# Patient Record
Sex: Male | Born: 1935
Health system: Southern US, Community
[De-identification: ages and names within clinical notes are randomized; demographics above are authoritative.]

## PROBLEM LIST (undated history)

## (undated) DIAGNOSIS — K219 Gastro-esophageal reflux disease without esophagitis: Secondary | ICD-10-CM

## (undated) DIAGNOSIS — R911 Solitary pulmonary nodule: Secondary | ICD-10-CM

## (undated) DIAGNOSIS — C61 Malignant neoplasm of prostate: Secondary | ICD-10-CM

## (undated) DIAGNOSIS — L409 Psoriasis, unspecified: Secondary | ICD-10-CM

## (undated) DIAGNOSIS — M1990 Unspecified osteoarthritis, unspecified site: Secondary | ICD-10-CM

## (undated) DIAGNOSIS — I2510 Atherosclerotic heart disease of native coronary artery without angina pectoris: Secondary | ICD-10-CM

## (undated) DIAGNOSIS — C649 Malignant neoplasm of unspecified kidney, except renal pelvis: Secondary | ICD-10-CM

## (undated) DIAGNOSIS — I10 Essential (primary) hypertension: Secondary | ICD-10-CM

## (undated) DIAGNOSIS — H3530 Unspecified macular degeneration: Secondary | ICD-10-CM

## (undated) DIAGNOSIS — E785 Hyperlipidemia, unspecified: Secondary | ICD-10-CM

## (undated) DIAGNOSIS — D369 Benign neoplasm, unspecified site: Secondary | ICD-10-CM

## (undated) DIAGNOSIS — C801 Malignant (primary) neoplasm, unspecified: Secondary | ICD-10-CM

## (undated) DIAGNOSIS — I4510 Unspecified right bundle-branch block: Secondary | ICD-10-CM

## (undated) DIAGNOSIS — D649 Anemia, unspecified: Secondary | ICD-10-CM

## (undated) DIAGNOSIS — K859 Acute pancreatitis without necrosis or infection, unspecified: Secondary | ICD-10-CM

## (undated) DIAGNOSIS — C4490 Unspecified malignant neoplasm of skin, unspecified: Secondary | ICD-10-CM

## (undated) DIAGNOSIS — N529 Male erectile dysfunction, unspecified: Secondary | ICD-10-CM

## (undated) HISTORY — DX: Male erectile dysfunction, unspecified: N52.9

## (undated) HISTORY — PX: PILONIDAL CYST EXCISION: SHX744

## (undated) HISTORY — DX: Hyperlipidemia, unspecified: E78.5

## (undated) HISTORY — DX: Malignant neoplasm of unspecified kidney, except renal pelvis: C64.9

## (undated) HISTORY — DX: Unspecified right bundle-branch block: I45.10

## (undated) HISTORY — DX: Anemia, unspecified: D64.9

## (undated) HISTORY — DX: Solitary pulmonary nodule: R91.1

## (undated) HISTORY — DX: Atherosclerotic heart disease of native coronary artery without angina pectoris: I25.10

## (undated) HISTORY — DX: Benign neoplasm, unspecified site: D36.9

---

## 2002-08-18 ENCOUNTER — Encounter: Admission: RE | Admit: 2002-08-18 | Discharge: 2002-11-16 | Payer: Self-pay | Admitting: Family Medicine

## 2003-11-25 ENCOUNTER — Ambulatory Visit (HOSPITAL_COMMUNITY): Admission: RE | Admit: 2003-11-25 | Discharge: 2003-11-25 | Payer: Self-pay | Admitting: Family Medicine

## 2006-12-27 ENCOUNTER — Encounter: Admission: RE | Admit: 2006-12-27 | Discharge: 2006-12-27 | Payer: Self-pay

## 2008-11-09 HISTORY — PX: US ECHOCARDIOGRAPHY: HXRAD669

## 2010-06-26 ENCOUNTER — Ambulatory Visit (HOSPITAL_COMMUNITY): Admission: RE | Admit: 2010-06-26 | Discharge: 2010-06-26 | Payer: Self-pay | Admitting: Family Medicine

## 2011-02-13 ENCOUNTER — Other Ambulatory Visit: Payer: Self-pay | Admitting: Cardiovascular Disease

## 2011-02-13 ENCOUNTER — Ambulatory Visit
Admission: RE | Admit: 2011-02-13 | Discharge: 2011-02-13 | Disposition: A | Discharge status: Home / Self Care | Payer: Medicare Other | Source: Ambulatory Visit | Attending: Cardiovascular Disease | Admitting: Cardiovascular Disease

## 2011-02-13 DIAGNOSIS — I2510 Atherosclerotic heart disease of native coronary artery without angina pectoris: Secondary | ICD-10-CM

## 2011-02-15 HISTORY — PX: NM MYOCAR PERF WALL MOTION: HXRAD629

## 2011-02-19 ENCOUNTER — Ambulatory Visit (HOSPITAL_COMMUNITY)
Admission: RE | Admit: 2011-02-19 | Discharge: 2011-02-19 | Disposition: A | Discharge status: Home / Self Care | Payer: Medicare Other | Source: Ambulatory Visit | Attending: Cardiovascular Disease | Admitting: Cardiovascular Disease

## 2011-02-19 DIAGNOSIS — R9431 Abnormal electrocardiogram [ECG] [EKG]: Secondary | ICD-10-CM | POA: Insufficient documentation

## 2011-02-19 DIAGNOSIS — I2510 Atherosclerotic heart disease of native coronary artery without angina pectoris: Secondary | ICD-10-CM | POA: Insufficient documentation

## 2011-02-19 DIAGNOSIS — E119 Type 2 diabetes mellitus without complications: Secondary | ICD-10-CM | POA: Insufficient documentation

## 2011-02-19 HISTORY — PX: CARDIAC CATHETERIZATION: SHX172

## 2011-02-19 LAB — GLUCOSE, CAPILLARY
Glucose-Capillary: 143 mg/dL — ABNORMAL HIGH (ref 70–99)
Glucose-Capillary: 143 mg/dL — ABNORMAL HIGH (ref 70–99)
Glucose-Capillary: 110 mg/dL — ABNORMAL HIGH (ref 70–99)

## 2011-02-26 ENCOUNTER — Other Ambulatory Visit: Payer: Self-pay | Admitting: Thoracic Surgery (Cardiothoracic Vascular Surgery)

## 2011-02-26 ENCOUNTER — Encounter (INDEPENDENT_AMBULATORY_CARE_PROVIDER_SITE_OTHER): Payer: Medicare Other | Admitting: Thoracic Surgery (Cardiothoracic Vascular Surgery)

## 2011-02-26 ENCOUNTER — Ambulatory Visit (HOSPITAL_COMMUNITY)
Admission: RE | Admit: 2011-02-26 | Discharge: 2011-02-26 | Disposition: A | Discharge status: Home / Self Care | Payer: Medicare Other | Source: Ambulatory Visit | Attending: Thoracic Surgery (Cardiothoracic Vascular Surgery) | Admitting: Thoracic Surgery (Cardiothoracic Vascular Surgery)

## 2011-02-26 ENCOUNTER — Encounter (HOSPITAL_COMMUNITY): Payer: Medicare Other

## 2011-02-26 ENCOUNTER — Ambulatory Visit (HOSPITAL_COMMUNITY): Payer: Medicare Other

## 2011-02-26 ENCOUNTER — Encounter (HOSPITAL_COMMUNITY)
Admission: RE | Admit: 2011-02-26 | Discharge: 2011-02-26 | Disposition: A | Discharge status: Home / Self Care | Payer: Medicare Other | Source: Ambulatory Visit | Attending: Thoracic Surgery (Cardiothoracic Vascular Surgery) | Admitting: Thoracic Surgery (Cardiothoracic Vascular Surgery)

## 2011-02-26 DIAGNOSIS — Z01810 Encounter for preprocedural cardiovascular examination: Secondary | ICD-10-CM

## 2011-02-26 DIAGNOSIS — Z01812 Encounter for preprocedural laboratory examination: Secondary | ICD-10-CM | POA: Insufficient documentation

## 2011-02-26 DIAGNOSIS — I2510 Atherosclerotic heart disease of native coronary artery without angina pectoris: Secondary | ICD-10-CM

## 2011-02-26 DIAGNOSIS — Z01818 Encounter for other preprocedural examination: Secondary | ICD-10-CM | POA: Insufficient documentation

## 2011-02-26 DIAGNOSIS — I519 Heart disease, unspecified: Secondary | ICD-10-CM

## 2011-02-26 LAB — COMPREHENSIVE METABOLIC PANEL
ALT: 16 U/L (ref 0–53)
AST: 18 U/L (ref 0–37)
Albumin: 3.9 g/dL (ref 3.5–5.2)
Alkaline Phosphatase: 66 U/L (ref 39–117)
BUN: 19 mg/dL (ref 6–23)
CO2: 25 mEq/L (ref 19–32)
Calcium: 8.9 mg/dL (ref 8.4–10.5)
Chloride: 105 mEq/L (ref 96–112)
Creatinine, Ser: 1.21 mg/dL (ref 0.4–1.5)
GFR calc Af Amer: >60 mL/min (ref >60)
GFR calc non Af Amer: 58 mL/min — ABNORMAL LOW (ref >60)
Glucose, Bld: 70 mg/dL (ref 70–99)
Potassium: 4.7 mEq/L (ref 3.5–5.1)
Sodium: 138 mEq/L (ref 135–145)
Total Bilirubin: 0.8 mg/dL (ref 0.3–1.2)
Total Protein: 6.1 g/dL (ref 6.0–8.3)

## 2011-02-26 LAB — CBC
HCT: 35.7 % — ABNORMAL LOW (ref 39.0–52.0)
Hemoglobin: 12 g/dL — ABNORMAL LOW (ref 13.0–17.0)
MCH: 30.6 pg (ref 26.0–34.0)
MCHC: 33.6 g/dL (ref 30.0–36.0)
MCV: 91.1 fL (ref 78.0–100.0)
Platelets: 171 10*3/uL (ref 150–400)
RBC: 3.92 MIL/uL — ABNORMAL LOW (ref 4.22–5.81)
RDW: 13.3 % (ref 11.5–15.5)
WBC: 7.9 10*3/uL (ref 4.0–10.5)

## 2011-02-26 LAB — PROTIME-INR
INR: 1.02 (ref 0.00–1.49)
Prothrombin Time: 13.6 seconds (ref 11.6–15.2)

## 2011-02-26 LAB — BLOOD GAS, ARTERIAL
Acid-Base Excess: 0.7 mmol/L (ref 0.0–2.0)
Bicarbonate: 24.8 mEq/L — ABNORMAL HIGH (ref 20.0–24.0)
Drawn by: 206361
FIO2: 0.21 %
O2 Saturation: 98.2 %
Patient temperature: 98.6
TCO2: 26 mmol/L (ref 0–100)
pCO2 arterial: 39.4 mmHg (ref 35.0–45.0)
pH, Arterial: 7.414 (ref 7.350–7.450)
pO2, Arterial: 95 mmHg (ref 80.0–100.0)

## 2011-02-26 LAB — URINALYSIS, ROUTINE W REFLEX MICROSCOPIC
Bilirubin Urine: NEGATIVE
Glucose, UA: NEGATIVE mg/dL
Hgb urine dipstick: NEGATIVE
Ketones, ur: NEGATIVE mg/dL
Nitrite: NEGATIVE
Protein, ur: NEGATIVE mg/dL
Specific Gravity, Urine: 1.016 (ref 1.005–1.030)
Urobilinogen, UA: 0.2 mg/dL (ref 0.0–1.0)
pH: 7.5 (ref 5.0–8.0)

## 2011-02-26 LAB — ABO/RH: ABO/RH(D): A NEG

## 2011-02-26 LAB — TYPE AND SCREEN
ABO/RH(D): A NEG
Antibody Screen: NEGATIVE

## 2011-02-26 LAB — HEMOGLOBIN A1C
Hgb A1c MFr Bld: 6.8 % — ABNORMAL HIGH (ref <5.7)
Mean Plasma Glucose: 148 mg/dL — ABNORMAL HIGH (ref <117)

## 2011-02-26 LAB — SURGICAL PCR SCREEN
MRSA, PCR: NEGATIVE
Staphylococcus aureus: POSITIVE — AB

## 2011-02-26 LAB — APTT: aPTT: 27 seconds (ref 24–37)

## 2011-02-27 ENCOUNTER — Ambulatory Visit (HOSPITAL_COMMUNITY)
Admission: RE | Admit: 2011-02-27 | Discharge: 2011-02-27 | Disposition: A | Discharge status: Home / Self Care | Payer: Medicare Other | Source: Ambulatory Visit | Attending: Thoracic Surgery (Cardiothoracic Vascular Surgery) | Admitting: Thoracic Surgery (Cardiothoracic Vascular Surgery)

## 2011-02-27 DIAGNOSIS — R0989 Other specified symptoms and signs involving the circulatory and respiratory systems: Secondary | ICD-10-CM | POA: Insufficient documentation

## 2011-02-27 DIAGNOSIS — Z01811 Encounter for preprocedural respiratory examination: Secondary | ICD-10-CM | POA: Insufficient documentation

## 2011-02-27 DIAGNOSIS — R0609 Other forms of dyspnea: Secondary | ICD-10-CM | POA: Insufficient documentation
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
## 2011-02-28 ENCOUNTER — Inpatient Hospital Stay (HOSPITAL_COMMUNITY)
Admission: RE | Admit: 2011-02-28 | Discharge: 2011-03-07 | DRG: 236 | Disposition: A | Discharge status: Home / Self Care | Payer: Medicare Other | Source: Ambulatory Visit | Attending: Thoracic Surgery (Cardiothoracic Vascular Surgery) | Admitting: Thoracic Surgery (Cardiothoracic Vascular Surgery)

## 2011-02-28 ENCOUNTER — Inpatient Hospital Stay (HOSPITAL_COMMUNITY): Payer: Medicare Other

## 2011-02-28 DIAGNOSIS — K219 Gastro-esophageal reflux disease without esophagitis: Secondary | ICD-10-CM | POA: Diagnosis present

## 2011-02-28 DIAGNOSIS — Z87891 Personal history of nicotine dependence: Secondary | ICD-10-CM

## 2011-02-28 DIAGNOSIS — M810 Age-related osteoporosis without current pathological fracture: Secondary | ICD-10-CM | POA: Diagnosis present

## 2011-02-28 DIAGNOSIS — M109 Gout, unspecified: Secondary | ICD-10-CM | POA: Diagnosis present

## 2011-02-28 DIAGNOSIS — D696 Thrombocytopenia, unspecified: Secondary | ICD-10-CM | POA: Diagnosis present

## 2011-02-28 DIAGNOSIS — I10 Essential (primary) hypertension: Secondary | ICD-10-CM | POA: Diagnosis present

## 2011-02-28 DIAGNOSIS — I209 Angina pectoris, unspecified: Secondary | ICD-10-CM | POA: Diagnosis present

## 2011-02-28 DIAGNOSIS — I2510 Atherosclerotic heart disease of native coronary artery without angina pectoris: Principal | ICD-10-CM | POA: Diagnosis present

## 2011-02-28 DIAGNOSIS — E119 Type 2 diabetes mellitus without complications: Secondary | ICD-10-CM | POA: Diagnosis present

## 2011-02-28 DIAGNOSIS — Z7982 Long term (current) use of aspirin: Secondary | ICD-10-CM

## 2011-02-28 DIAGNOSIS — J438 Other emphysema: Secondary | ICD-10-CM | POA: Diagnosis present

## 2011-02-28 DIAGNOSIS — D62 Acute posthemorrhagic anemia: Secondary | ICD-10-CM | POA: Diagnosis not present

## 2011-02-28 HISTORY — PX: CORONARY ARTERY BYPASS GRAFT: SHX141

## 2011-02-28 LAB — POCT I-STAT 4, (NA,K, GLUC, HGB,HCT)
Glucose, Bld: 153 mg/dL — ABNORMAL HIGH (ref 70–99)
Glucose, Bld: 132 mg/dL — ABNORMAL HIGH (ref 70–99)
Glucose, Bld: 115 mg/dL — ABNORMAL HIGH (ref 70–99)
Glucose, Bld: 133 mg/dL — ABNORMAL HIGH (ref 70–99)
Glucose, Bld: 130 mg/dL — ABNORMAL HIGH (ref 70–99)
HCT: 30 % — ABNORMAL LOW (ref 39.0–52.0)
HCT: 28 % — ABNORMAL LOW (ref 39.0–52.0)
HCT: 24 % — ABNORMAL LOW (ref 39.0–52.0)
HCT: 30 % — ABNORMAL LOW (ref 39.0–52.0)
Hemoglobin: 10.2 g/dL — ABNORMAL LOW (ref 13.0–17.0)
Hemoglobin: 9.5 g/dL — ABNORMAL LOW (ref 13.0–17.0)
Hemoglobin: 8.2 g/dL — ABNORMAL LOW (ref 13.0–17.0)
Hemoglobin: 8.5 g/dL — ABNORMAL LOW (ref 13.0–17.0)
Hemoglobin: 10.2 g/dL — ABNORMAL LOW (ref 13.0–17.0)
Potassium: 4.4 mEq/L (ref 3.5–5.1)
Potassium: 4.6 mEq/L (ref 3.5–5.1)
Potassium: 4.6 mEq/L (ref 3.5–5.1)
Potassium: 4.9 mEq/L (ref 3.5–5.1)
Potassium: 4.8 mEq/L (ref 3.5–5.1)
Sodium: 139 mEq/L (ref 135–145)
Sodium: 138 mEq/L (ref 135–145)
Sodium: 134 mEq/L — ABNORMAL LOW (ref 135–145)
Sodium: 133 mEq/L — ABNORMAL LOW (ref 135–145)

## 2011-02-28 LAB — PROTIME-INR
INR: 1.43 (ref 0.00–1.49)
Prothrombin Time: 17.6 seconds — ABNORMAL HIGH (ref 11.6–15.2)

## 2011-02-28 LAB — POCT I-STAT 3, ART BLOOD GAS (G3+)
Acid-Base Excess: 1 mmol/L (ref 0.0–2.0)
Acid-Base Excess: 2 mmol/L (ref 0.0–2.0)
Acid-Base Excess: 2 mmol/L (ref 0.0–2.0)
Acid-base deficit: 1 mmol/L (ref 0.0–2.0)
Acid-base deficit: 1 mmol/L (ref 0.0–2.0)
Bicarbonate: 25.5 mEq/L — ABNORMAL HIGH (ref 20.0–24.0)
Bicarbonate: 25.5 mEq/L — ABNORMAL HIGH (ref 20.0–24.0)
Bicarbonate: 25.5 mEq/L — ABNORMAL HIGH (ref 20.0–24.0)
Bicarbonate: 24.8 mEq/L — ABNORMAL HIGH (ref 20.0–24.0)
Bicarbonate: 26.2 mEq/L — ABNORMAL HIGH (ref 20.0–24.0)
Bicarbonate: 24.3 mEq/L — ABNORMAL HIGH (ref 20.0–24.0)
Bicarbonate: 24.4 mEq/L — ABNORMAL HIGH (ref 20.0–24.0)
Bicarbonate: 24.9 mEq/L — ABNORMAL HIGH (ref 20.0–24.0)
Bicarbonate: 24.9 mEq/L — ABNORMAL HIGH (ref 20.0–24.0)
Bicarbonate: 25.1 mEq/L — ABNORMAL HIGH (ref 20.0–24.0)
O2 Saturation: 100 %
O2 Saturation: 100 %
O2 Saturation: 100 %
O2 Saturation: 100 %
O2 Saturation: 100 %
O2 Saturation: 100 %
O2 Saturation: 100 %
Patient temperature: 34.1
Patient temperature: 34.7
Patient temperature: 37
TCO2: 26 mmol/L (ref 0–100)
TCO2: 27 mmol/L (ref 0–100)
TCO2: 26 mmol/L (ref 0–100)
TCO2: 27 mmol/L (ref 0–100)
TCO2: 24 mmol/L (ref 0–100)
TCO2: 25 mmol/L (ref 0–100)
TCO2: 23 mmol/L (ref 0–100)
TCO2: 26 mmol/L (ref 0–100)
TCO2: 26 mmol/L (ref 0–100)
TCO2: 26 mmol/L (ref 0–100)
pCO2 arterial: 32.5 mmHg — ABNORMAL LOW (ref 35.0–45.0)
pCO2 arterial: 28.9 mmHg — ABNORMAL LOW (ref 35.0–45.0)
pCO2 arterial: 42 mmHg (ref 35.0–45.0)
pCO2 arterial: 35.9 mmHg (ref 35.0–45.0)
pCO2 arterial: 43.7 mmHg (ref 35.0–45.0)
pH, Arterial: 7.489 — ABNORMAL HIGH (ref 7.350–7.450)
pH, Arterial: 7.502 — ABNORMAL HIGH (ref 7.350–7.450)
pH, Arterial: 7.403 (ref 7.350–7.450)
pH, Arterial: 7.393 (ref 7.350–7.450)
pH, Arterial: 7.449 (ref 7.350–7.450)
pH, Arterial: 7.303 — ABNORMAL LOW (ref 7.350–7.450)
pH, Arterial: 7.367 (ref 7.350–7.450)
pO2, Arterial: 425 mmHg — ABNORMAL HIGH (ref 80.0–100.0)
pO2, Arterial: 263 mmHg — ABNORMAL HIGH (ref 80.0–100.0)
pO2, Arterial: 369 mmHg — ABNORMAL HIGH (ref 80.0–100.0)
pO2, Arterial: 153 mmHg — ABNORMAL HIGH (ref 80.0–100.0)
pO2, Arterial: 297 mmHg — ABNORMAL HIGH (ref 80.0–100.0)
pO2, Arterial: 174 mmHg — ABNORMAL HIGH (ref 80.0–100.0)

## 2011-02-28 LAB — CBC
Hemoglobin: 9.4 g/dL — ABNORMAL LOW (ref 13.0–17.0)
MCH: 30.6 pg (ref 26.0–34.0)
MCH: 31.2 pg (ref 26.0–34.0)
MCHC: 34.1 g/dL (ref 30.0–36.0)
MCV: 89 fL (ref 78.0–100.0)
Platelets: 114 10*3/uL — ABNORMAL LOW (ref 150–400)
Platelets: 106 10*3/uL — ABNORMAL LOW (ref 150–400)
RBC: 3.24 MIL/uL — ABNORMAL LOW (ref 4.22–5.81)
RBC: 3.01 MIL/uL — ABNORMAL LOW (ref 4.22–5.81)
WBC: 13.3 10*3/uL — ABNORMAL HIGH (ref 4.0–10.5)

## 2011-02-28 LAB — POCT I-STAT, CHEM 8
BUN: 16 mg/dL (ref 6–23)
Hemoglobin: 8.8 g/dL — ABNORMAL LOW (ref 13.0–17.0)
Sodium: 138 mEq/L (ref 135–145)
TCO2: 24 mmol/L (ref 0–100)

## 2011-02-28 LAB — CREATININE, SERUM
GFR calc Af Amer: >60 mL/min (ref >60)
GFR calc non Af Amer: 55 mL/min — ABNORMAL LOW (ref >60)

## 2011-02-28 LAB — GLUCOSE, CAPILLARY
Glucose-Capillary: 136 mg/dL — ABNORMAL HIGH (ref 70–99)
Glucose-Capillary: 81 mg/dL (ref 70–99)
Glucose-Capillary: 151 mg/dL — ABNORMAL HIGH (ref 70–99)
Glucose-Capillary: 142 mg/dL — ABNORMAL HIGH (ref 70–99)

## 2011-02-28 LAB — MAGNESIUM: Magnesium: 2.4 mg/dL (ref 1.5–2.5)

## 2011-02-28 LAB — PLATELET COUNT: Platelets: 108 10*3/uL — ABNORMAL LOW (ref 150–400)

## 2011-02-28 LAB — HEMOGLOBIN AND HEMATOCRIT, BLOOD
HCT: 22.7 % — ABNORMAL LOW (ref 39.0–52.0)
Hemoglobin: 7.9 g/dL — ABNORMAL LOW (ref 13.0–17.0)

## 2011-03-01 ENCOUNTER — Inpatient Hospital Stay (HOSPITAL_COMMUNITY): Payer: Medicare Other

## 2011-03-01 LAB — BASIC METABOLIC PANEL
CO2: 25 mEq/L (ref 19–32)
Glucose, Bld: 176 mg/dL — ABNORMAL HIGH (ref 70–99)
Potassium: 4.6 mEq/L (ref 3.5–5.1)
Sodium: 137 mEq/L (ref 135–145)

## 2011-03-01 LAB — CBC
HCT: 26 % — ABNORMAL LOW (ref 39.0–52.0)
Hemoglobin: 9 g/dL — ABNORMAL LOW (ref 13.0–17.0)
MCH: 31 pg (ref 26.0–34.0)
MCHC: 34.6 g/dL (ref 30.0–36.0)
MCV: 90.1 fL (ref 78.0–100.0)
Platelets: 112 10*3/uL — ABNORMAL LOW (ref 150–400)
RDW: 13.6 % (ref 11.5–15.5)
WBC: 16 10*3/uL — ABNORMAL HIGH (ref 4.0–10.5)

## 2011-03-01 LAB — GLUCOSE, CAPILLARY
Glucose-Capillary: 153 mg/dL — ABNORMAL HIGH (ref 70–99)
Glucose-Capillary: 162 mg/dL — ABNORMAL HIGH (ref 70–99)
Glucose-Capillary: 244 mg/dL — ABNORMAL HIGH (ref 70–99)

## 2011-03-01 LAB — POCT I-STAT, CHEM 8
BUN: 25 mg/dL — ABNORMAL HIGH (ref 6–23)
Calcium, Ion: 1.09 mmol/L — ABNORMAL LOW (ref 1.12–1.32)
Creatinine, Ser: 1.8 mg/dL — ABNORMAL HIGH (ref 0.4–1.5)
TCO2: 27 mmol/L (ref 0–100)

## 2011-03-01 LAB — MAGNESIUM: Magnesium: 2.1 mg/dL (ref 1.5–2.5)

## 2011-03-02 ENCOUNTER — Inpatient Hospital Stay (HOSPITAL_COMMUNITY): Payer: Medicare Other

## 2011-03-02 LAB — COMPREHENSIVE METABOLIC PANEL
ALT: 14 U/L (ref 0–53)
AST: 22 U/L (ref 0–37)
Albumin: 3.2 g/dL — ABNORMAL LOW (ref 3.5–5.2)
Alkaline Phosphatase: 36 U/L — ABNORMAL LOW (ref 39–117)
BUN: 25 mg/dL — ABNORMAL HIGH (ref 6–23)
CO2: 29 mEq/L (ref 19–32)
Calcium: 8.2 mg/dL — ABNORMAL LOW (ref 8.4–10.5)
Chloride: 100 mEq/L (ref 96–112)
Creatinine, Ser: 1.63 mg/dL — ABNORMAL HIGH (ref 0.4–1.5)
GFR calc Af Amer: 50 mL/min — ABNORMAL LOW (ref >60)
GFR calc non Af Amer: 41 mL/min — ABNORMAL LOW (ref >60)
Glucose, Bld: 76 mg/dL (ref 70–99)
Potassium: 3.7 mEq/L (ref 3.5–5.1)
Sodium: 138 mEq/L (ref 135–145)
Total Bilirubin: 0.9 mg/dL (ref 0.3–1.2)
Total Protein: 5.1 g/dL — ABNORMAL LOW (ref 6.0–8.3)

## 2011-03-02 LAB — GLUCOSE, CAPILLARY: Glucose-Capillary: 78 mg/dL (ref 70–99)

## 2011-03-02 LAB — POCT I-STAT, CHEM 8
Calcium, Ion: 1.11 mmol/L — ABNORMAL LOW (ref 1.12–1.32)
Chloride: 98 mEq/L (ref 96–112)
Glucose, Bld: 76 mg/dL (ref 70–99)
HCT: 24 % — ABNORMAL LOW (ref 39.0–52.0)
Hemoglobin: 8.2 g/dL — ABNORMAL LOW (ref 13.0–17.0)
Potassium: 3.7 mEq/L (ref 3.5–5.1)

## 2011-03-02 LAB — BASIC METABOLIC PANEL
Calcium: 6.4 mg/dL — CL (ref 8.4–10.5)
Chloride: 101 mEq/L (ref 96–112)
Creatinine, Ser: 1.66 mg/dL — ABNORMAL HIGH (ref 0.4–1.5)
GFR calc Af Amer: 49 mL/min — ABNORMAL LOW (ref >60)
GFR calc non Af Amer: 41 mL/min — ABNORMAL LOW (ref >60)

## 2011-03-02 LAB — CALCIUM, IONIZED: Calcium, Ion: 1.15 mmol/L (ref 1.12–1.32)

## 2011-03-02 LAB — CBC
MCH: 31.1 pg (ref 26.0–34.0)
Platelets: 107 10*3/uL — ABNORMAL LOW (ref 150–400)
RBC: 2.7 MIL/uL — ABNORMAL LOW (ref 4.22–5.81)
RDW: 13.7 % (ref 11.5–15.5)
WBC: 12.5 10*3/uL — ABNORMAL HIGH (ref 4.0–10.5)

## 2011-03-03 ENCOUNTER — Inpatient Hospital Stay (HOSPITAL_COMMUNITY): Payer: Medicare Other

## 2011-03-03 LAB — BASIC METABOLIC PANEL
BUN: 28 mg/dL — ABNORMAL HIGH (ref 6–23)
CO2: 31 mEq/L (ref 19–32)
Chloride: 100 mEq/L (ref 96–112)
Creatinine, Ser: 1.55 mg/dL — ABNORMAL HIGH (ref 0.4–1.5)
Glucose, Bld: 121 mg/dL — ABNORMAL HIGH (ref 70–99)
Potassium: 4.1 mEq/L (ref 3.5–5.1)

## 2011-03-03 LAB — CBC
HCT: 25.1 % — ABNORMAL LOW (ref 39.0–52.0)
MCH: 30.7 pg (ref 26.0–34.0)
MCV: 91.6 fL (ref 78.0–100.0)
Platelets: 115 10*3/uL — ABNORMAL LOW (ref 150–400)
RBC: 2.74 MIL/uL — ABNORMAL LOW (ref 4.22–5.81)
WBC: 12.9 10*3/uL — ABNORMAL HIGH (ref 4.0–10.5)

## 2011-03-03 LAB — GLUCOSE, CAPILLARY
Glucose-Capillary: 116 mg/dL — ABNORMAL HIGH (ref 70–99)
Glucose-Capillary: 176 mg/dL — ABNORMAL HIGH (ref 70–99)
Glucose-Capillary: 149 mg/dL — ABNORMAL HIGH (ref 70–99)
Glucose-Capillary: 145 mg/dL — ABNORMAL HIGH (ref 70–99)

## 2011-03-04 LAB — BASIC METABOLIC PANEL
Calcium: 8.1 mg/dL — ABNORMAL LOW (ref 8.4–10.5)
GFR calc Af Amer: 53 mL/min — ABNORMAL LOW (ref >60)
GFR calc non Af Amer: 44 mL/min — ABNORMAL LOW (ref >60)
Glucose, Bld: 96 mg/dL (ref 70–99)
Potassium: 4 mEq/L (ref 3.5–5.1)
Sodium: 138 mEq/L (ref 135–145)

## 2011-03-04 LAB — GLUCOSE, CAPILLARY
Glucose-Capillary: 89 mg/dL (ref 70–99)
Glucose-Capillary: 129 mg/dL — ABNORMAL HIGH (ref 70–99)
Glucose-Capillary: 140 mg/dL — ABNORMAL HIGH (ref 70–99)

## 2011-03-05 LAB — BASIC METABOLIC PANEL
CO2: 30 mEq/L (ref 19–32)
Calcium: 8.4 mg/dL (ref 8.4–10.5)
GFR calc Af Amer: 47 mL/min — ABNORMAL LOW (ref >60)
GFR calc non Af Amer: 39 mL/min — ABNORMAL LOW (ref >60)
Potassium: 4.3 mEq/L (ref 3.5–5.1)
Sodium: 139 mEq/L (ref 135–145)

## 2011-03-05 LAB — GLUCOSE, CAPILLARY
Glucose-Capillary: 119 mg/dL — ABNORMAL HIGH (ref 70–99)
Glucose-Capillary: 198 mg/dL — ABNORMAL HIGH (ref 70–99)
Glucose-Capillary: 182 mg/dL — ABNORMAL HIGH (ref 70–99)

## 2011-03-06 DIAGNOSIS — M79609 Pain in unspecified limb: Secondary | ICD-10-CM

## 2011-03-06 LAB — GLUCOSE, CAPILLARY
Glucose-Capillary: 73 mg/dL (ref 70–99)
Glucose-Capillary: 106 mg/dL — ABNORMAL HIGH (ref 70–99)
Glucose-Capillary: 115 mg/dL — ABNORMAL HIGH (ref 70–99)
Glucose-Capillary: 97 mg/dL (ref 70–99)

## 2011-03-06 LAB — BASIC METABOLIC PANEL
BUN: 28 mg/dL — ABNORMAL HIGH (ref 6–23)
Calcium: 8.5 mg/dL (ref 8.4–10.5)
GFR calc non Af Amer: 44 mL/min — ABNORMAL LOW (ref >60)
Glucose, Bld: 67 mg/dL — ABNORMAL LOW (ref 70–99)
Sodium: 139 mEq/L (ref 135–145)

## 2011-03-07 LAB — GLUCOSE, CAPILLARY: Glucose-Capillary: 93 mg/dL (ref 70–99)

## 2011-03-07 NOTE — Discharge Summary (Signed)
NAME:  Grant [name], Grant [name] NO.:  [id number]  MEDICAL RECORD NO.:  [id number]           PATIENT TYPE:  O  LOCATION:  RESPT                        FACILITY:  [facility]  PHYSICIAN:  Salvatore Decent. Dorris Fetch, M.D.DATE OF BIRTH:  Apr 19, 1934  DATE OF ADMISSION:  02/27/2011 DATE OF DISCHARGE:  02/27/2011                              DISCHARGE SUMMARY   FINAL DIAGNOSIS:  Three-vessel coronary artery disease with exertional angina.  IN-HOSPITAL DIAGNOSES: 1. Hypovolemic shock postoperatively. 2. Bronchospasm postoperatively. 3. Acute renal insufficiency postoperatively. 4. Acute blood loss anemia postoperatively. 5. Postoperative thrombocytopenia. 6. Atrial fibrillation postoperatively.  SECONDARY DIAGNOSES: 1. Non-insulin dependent type 2 diabetes. 2. Hypertension. 3. Hyperlipidemia. 4. Macular degeneration. 5. Osteoarthritis.  IN-HOSPITAL OPERATIONS AND PROCEDURES:  Coronary artery bypass grafting x4 using a left internal mammary artery to left anterior descending, sequential saphenous vein graft to obtuse marginal 1 and 2, saphenous vein graft to acute marginal, endoscopic vein harvesting from right leg.  HISTORY AND PHYSICAL AND HOSPITAL COURSE:  Grant [name] is a 77 year old gentleman with known three-vessel coronary artery disease.  He has been managed medically.  He has been experiencing chest pain with exertion recently.  The patient underwent cardiac catheterization on February 19, 2011, which showed three-vessel coronary artery disease with progressive disease particularly in the circumflex.  The patient was advised to undergo coronary artery bypass grafting.  He was seen and evaluated by Dr. Dorris Fetch.  Dr. Dorris Fetch discussed with the patient risks and benefits of undergoing coronary artery bypass grafting.  The patient nods understanding and agreed to proceed.  Surgery was scheduled for February 28, 2011.  For further details of the patient's past  medical history and physical exam, please see dictated H and P.  The patient was taken to the operating room on February 28, 2011, where he underwent coronary artery bypass grafting x4 using a left internal mammary artery to left anterior descending, sequential saphenous vein graft to obtuse marginal 1 and 2, saphenous vein graft to acute marginal, endoscopic vein harvesting from right leg.  The patient tolerated this procedure well and was transferred to the intensive care unit in stable condition.  Shortly after arrival to the SICU, the patient had a hemodynamic collapse with a blood pressure down to systolic in the 40s.  The patient was resuscitated at the bedside.  He felt that he experienced hypovolemic shock and bronchospasm.  The patient stabilized with treatment.  The following morning, the patient was able to be extubated.  Post extubation, he was noted to be alert and oriented x4.  Neuro intact.  Blood pressure was stable.  The patient was a-pacing in the 70s.  He was continued on dopamine drip as well as Neo. Neo drip was able to be weaned and discontinued.  The patient was continued on dopamine drip for elevated creatinine was up to 1.55. Blood pressure is remaining stable.  External pacer was able to be turned off and the patient was maintaining sinus rhythm in the 80s.  He was started back on his Coreg.  The patient did go into rapid atrial fibrillation on postop day #3.  He was started on IV  amiodarone.  The patient was able to convert back to normal sinus rhythm with medication. He was switched over to p.o. amiodarone.  Currently, the patient is maintaining normal sinus rhythm.  His blood pressure is well controlled on Coreg.  An ACE inhibitor has not been started secondary to blood pressure stable on beta-blocker as well as acute renal insufficiency postoperatively.  The patient received IV Solu-Medrol for bronchospasm as well as albuterol.  As stated above, he was able  to be extubated postop day #1. Bronchospasms noted to have improved with medication.  Chest x-ray obtained postop day #1 showed to be stable with mild basilar atelectasis with no evidence of edema or pleural fluid.  The patient had minimum drainage from chest tubes and chest tubes were discontinued in routine fashion.  He was encouraged to use his incentive spirometer.  Most recent chest x-ray on March 03, 2011, showed small bilateral pleural effusions with atelectasis.  Otherwise, stable exam.  The patient has been able to be weaned off oxygen with O2 saturations maintaining greater than 90% on room air.  As stated above, the patient did develop acute renal insufficiency. Postop day #1, his creatinine was up to 1.55.  He remained on renal dopamine.  Later that evening, creatinine increased to 1.8.  It stabilized at 1.8 and renal dopamine was discontinued.  The patient's creatinine started to stabilize and was 1.55 by postop day #4. Unfortunately, it started to go back up by postop day #5 at 1.72.  We plan to recheck a creatinine level in the a.m. for further management. The patient is a known diabetic and has not been restarted on his metformin secondary to acute renal insufficiency.  His blood sugars have been followed closely and he was started on Lantus insulin and was restarted on his Amaryl.  Currently, blood sugars were well controlled with Lantus insulin and Amaryl.  We will not restart the patient's metformin at this time, it can be reevaluated as an outpatient.  The patient was also on Lasix for volume overload.  Daily weights were followed.  The patient remains 1.6 kg above his preoperative weight. Lasix was also discontinued secondary to acute renal insufficiency.  I will continue to monitor.  The patient did have some mild acute blood loss anemia.  His hemoglobin and hematocrit were followed closely.  The patient did not require any blood transfusions postoperatively.   Most recent hemoglobin and hematocrit is stable at 8.4 and 25.1.  The patient did have some mild thrombocytopenia postoperatively.  Platelet count dropped to 104.  This was followed closely and stabilized.  Most recent platelet count was 115.  The patient was transferred out the SICU to 2000 on postop day #2. While on telemetry floor, he continued to progress well.  He has been up ambulating well with assistance.  He is tolerating diet well.  No nausea, vomiting noted.  All incisions are clean, dry and intact and healing well.  On March 05, 2011, postop day #5, the patient is currently afebrile in normal sinus rhythm.  Blood pressure stable.  O2 sats greater than 90% on room air.  Most recent lab work shows sodium of 139, potassium 4.3, chloride of 98, bicarbonate 30, BUN 32, creatinine 1.72, glucose 115.  White blood cell count 12.9, hemoglobin of 8.4, hematocrit 25.1, platelet count 115.  The patient is tentatively ready for discharge to home in the a.m. pending his creatinine improves.  FOLLOWUP APPOINTMENTS:  A follow-up appointment has been arranged with Dr.  Elston Aldape's physician assistant for March 26, 2011, at 1 o'clock p.m.  The patient will need to obtain PMI chest x-ray 45 minutes prior to this appointment.  He will need to follow up with Dr. Royann Shivers in 2 weeks.  He will need to contact his office to make these arrangements.  ACTIVITY:  The patient instructed no driving until released to do so, no lifting over 10 pounds.  He is told to ambulate 3-4 times per day and progress as tolerated and continue his breathing exercises.  INCISIONAL CARE:  The patient is told to shower washing his incisions using soap and water.  Contact the office if he develops any drainage or opening from any of his incision sites.  DIET:  The patient to get on diet to be low-fat, low-salt as well as diabetic diet.  DISCHARGE MEDICATIONS: 1. Amiodarone 400 mg b.i.d. x14 days, then amiodarone 200  mg b.i.d. 2. Lantus insulin 20 units at night. 3. Oxycodone 5 mg 1-2 tablets q. 34 hours p.r.n. pain. 4. Enteric coated aspirin 325 mg daily. 5. Coreg 12.5 mg b.i.d. 6. Amaryl 4 mg daily. 7. Lipitor 40 mg daily. 8. PreserVision b.i.d. 9. Ranitidine 150 mg b.i.d. p.r.n.     Sol Blazing, PA   ______________________________ [name], M.D.    KMD/MEDQ  D:  03/05/2011  T:  03/06/2011  Job:  [id number]  cc:   Thurmon Fair, MD  Electronically Signed by Cameron Proud PA on 03/06/2011 10:06:20 AM Electronically Signed by Charlett Lango M.D. on 03/07/2011 11:56:21 AM

## 2011-03-07 NOTE — Op Note (Signed)
NAME:  Grant [name], Grant [name] NO.:  [id number]  MEDICAL RECORD NO.:  [id number]           PATIENT TYPE:  I  LOCATION:  2307                         FACILITY:  [facility]  PHYSICIAN:  Salvatore Decent. Dorris Fetch, M.D.DATE OF BIRTH:  1934/09/01  DATE OF PROCEDURE:  02/28/2011 DATE OF DISCHARGE:                              OPERATIVE REPORT   PREOPERATIVE DIAGNOSIS:  Three-vessel coronary artery disease with exertional angina.  POSTOPERATIVE DIAGNOSIS:  Three-vessel coronary artery disease with exertional angina.  PROCEDURE:  Median sternotomy, extracorporeal circulation, coronary artery bypass grafting x4 (left internal mammary artery to LAD, sequential saphenous vein graft to obtuse marginal 1 and 2, saphenous vein graft to acute marginal), endoscopic vein harvest, right leg.  SURGEON:  Salvatore Decent. Dorris Fetch, MD  ASSISTANT:  Coral Ceo, PA  ANESTHESIA:  General.  FINDINGS:  Mild sternal osteoporosis, emphysema, cardiomegaly, good- quality targets, good-quality conduits, two 1 mm vessel.  CLINICAL NOTE:  Grant [name] is a 77 year old gentleman with known three- vessel coronary artery disease.  He has been managed medically.  He has been experiencing chest pain with exertion recently.  He underwent cardiac catheterization, which showed progressive disease particularly in the circumflex.  He was advised to undergo coronary artery bypass grafting given that he had three-vessel disease in the setting of diabetes.  The indications, risks, benefits and alternatives were discussed in detail.  The patient understood the continued medical management was an option.  He did wish to proceed with surgery at this time.  OPERATIVE NOTE:  Grant [name] was brought to the preop holding area on February 28, 2011, there the Anesthesia Service placed a Swan-Ganz catheter and arterial blood pressure monitoring line.  Intravenous antibiotics were administered.  He was taken to the operating room,  anesthetized, and intubated.  Foley catheter was placed.  The chest, abdomen and legs were prepped and draped in usual sterile fashion.  Incision was made in the medial aspect of the right leg at the level of the knee.  The greater saphenous vein was harvested endoscopically from the right thigh.  It was a good-quality vessel.  Simultaneously, a median sternotomy was performed and the left internal mammary artery was harvested using standard technique.  A 2000 units of heparin was administered during the vessel harvest.  The remainder of the full heparin dose was given prior to opening the pericardium.  Pericardium was opened.  The ascending aorta was inspected.  There was no palpable atherosclerotic disease.  The aorta was cannulated via the concentric 2-0 Ethibond pledgeted pursestring sutures.  A total stage venous cannula was placed via pursestring suture in the right atrial appendage.  Cardiopulmonary bypass was instituted and the patient was cooled to 32 degrees Celsius.  The coronary arteries were inspected andanastomotic sites were chosen.  The conduits were inspected and cut to length.  A foam pad was placed in the pericardium to protect the left phrenic nerve.  A temperature probe was placed in myocardial septum and a cardioplegic cannula was placed in the ascending aorta.  The aorta was crossclamped.  The left ventricle was emptied via the aortic root vent.  Cardiac arrest then  was achieved with a combination of cold antegrade blood cardioplegia and topical iced saline.  A 7500 mL of cardioplegia was administered.  There was a rapid diastolic arrest. There was cooling to less than 10 degrees Celsius.  Following distal anastomoses were performed.  First, a reverse saphenous vein graft was placed end-to-side to the acute marginal branch of the right coronary artery.  Right coronary artery bifurcated very high in the acute marginal traverse the inferior wall.  Essentially, it  was the posterior descending equivalent. Continuation of the right coronary artery give a very tiny nongraftable branches distally.  There was approximately 60% stenosis in the mid right coronary artery prior to the bifurcation.  Acute marginal was a 1.5-mm good-quality target.  The vein was of good quality.  The anastomosis was performed end-to-side with a running 7-0 Prolene suture.  Next, a reverse saphenous vein graft was placed sequentially to obtuse marginal 1 and 2.  Obtuse marginal 1 was a 1.5-mm vessel with a dominant lateral wall vessel.  It was totally occluded and filled via collaterals.  It was a good-quality target at site of the anastomosis. The vein graft was anastomosed side-to-side with a running 7-0 Prolene suture.  Probe passed easily proximally and distally through the anastomosis as each anastomosis was probed in its completion to ensure patency.  Next, the distal end of the vein was beveled and was then anastomosed end-to-side to OM-2.  OM-2 was the largest of the distal branches of the circumflex, which has 95% stenosis just after the complete occlusion of the first obtuse marginal.  This was a 1-mm target vessel.  There was no atherosclerotic disease.  The vein graft was anastomosed end-to-side with a running 7-0 Prolene suture.  Again, the probe passed easily proximally and distally.  Cardioplegia was administered.  There was good flow and good hemostasis.  Next, the left internal mammary was put through a window in the pericardium.  The distal end was beveled, it was a 2-mm good-quality conduit, it was anastomosed end-to-side to the LAD distally.  The LAD was intramyocardial only superficially, so the LAD was a 2-mm good- quality target.  The anastomosis was performed end-to-side with a running 8-0 Prolene suture.  At the completion of the mammary to LAD anastomosis, the bulldog clamp was briefly removed and inspected for hemostasis.  Immediate rapid septal  rewarming was noted.  The bulldog clamp was replaced.  The mammary pedicle was tacked to the epicardial surface of the heart with 6-0 Prolene sutures.  Additional cardioplegia was administered.  The vein grafts were cut to length and proximal vein graft anastomoses were performed to 4.5 mm punch aortotomies with running 6-0 Prolene sutures.  At the completion of the final proximal anastomosis, the patient was placed in Trendelenburg position.  Lidocaine was administered.  The aortic root was de-aired and the aortic crossclamp was removed.  The total crossclamp time was 64 minutes.  The patient required a single defibrillation with 10 joules and remained in sinus rhythm thereafter. While rewarming was completed, all proximal and distal anastomoses were inspect for hemostasis.  Epicardial pacing wires were placed on the right ventricle and right atrium.  When the patient was rewarmed to a core temperature of 37 degrees Celsius, he was weaned from cardiopulmonary bypass on the first attempt, it was DDD paced, but on no inotropic support at the time of separation from bypass.  Total bypass time was 96 minutes.  A test dose of protamine was administered and was well  tolerated.  The atrial and aortic cannulae were removed.  The remainder of the protamine was administered without incident.  The chest was irrigated with warm saline.  Hemostasis was achieved.  A left pleural and single mediastinal chest tubes were placed through separate subcostal incisions.  The mediastinal tube was placed at the tip of which was placed into the right pleural space.  The pericardium was reapproximated with interrupted 3-0 silk sutures that came together easily without tension or kinking the other underlying grafts.  The sternum was then closed with a combination of single and double heavy gauge stainless steel wires.  The pectoralis fascia, subcutaneous tissue, and skin were closed in standard fashion.  All  sponge, needle, and instrument counts were correct.  At the end of the procedure, and the patient was taken from the operating room to the surgical intensive care unit in good condition.     [name], M.D.     SCH/MEDQ  D:  02/28/2011  T:  03/01/2011  Job:  [id number]  cc:   Thurmon Fair, MD Madelin Rear. Sherwood Gambler, MD [name], M.D.  Electronically Signed by Charlett Lango M.D. on 03/07/2011 11:56:34 AM

## 2011-03-19 NOTE — Cardiovascular Report (Signed)
NAME:  Grant [name], Grant [name] NO.:  [id number]  MEDICAL RECORD NO.:  [id number]           PATIENT TYPE:  O  LOCATION:  [facility]                         FACILITY:  [facility]  PHYSICIAN:  Thurmon Fair, MD     DATE OF BIRTH:  March 29, 1934  DATE OF PROCEDURE: DATE OF DISCHARGE:  02/19/2011                           CARDIAC CATHETERIZATION   PROCEDURES PERFORMED: 1. Left heart catheterization. 2. Selective coronary angiography. 3. Left ventriculography.  Grant [name] is a 77 year old man with known multivessel coronary artery disease that has been treated medically.  Recently, he has developed significant ST-T segment changes consistent with active ischemia.  After risks and benefits of the procedure were provided, the patient provided informed consent and was prepped and draped in usual sterile fashion.  Local anesthesia with 1% lidocaine was administered to the right groin area and a 5-French right common femoral artery sheath wasintroduced via the modified Seldinger technique.  Using a 5-French JL-4 catheter, selective cannulation and angiography of the left coronary artery was performed.  This was subsequently exchanged for a JR catheter which could not provide selective cannulation of the right coronary artery.  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  Using a 5-French AL-2 catheters, the 5-French AL-2 catheter, the right coronary artery was selectively cannulated and multiple angiograms performed.  Finally, an angled pigtail catheter was used to cross the aortic valve and perform a left ventriculogram in the RAO projection as well as a pullback to the aorta.  No complications occurred.  FINDINGS: 1. The left main coronary artery is short and free of significant     obstruction.  It bifurcates in usual fashion in the LAD and left     circumflex coronary arteries. 2. The left anterior descending artery  is a large vessel that courses     all the way beyond the apex to the distal third of the inferior     wall.  It gives off several diagonal vessels, none of which are     very large in caliber.  At the level of the second septal artery,     there was a long segment of luminal irregularity with a maximum     diameter stenosis of about 70%.  No other meaningful obstructions     are seen in the LAD artery, although irregular plaque is seen     throughout. 3. The left circumflex coronary artery is a very large codominant     vessel.  It generates 3 oblique marginal arteries of which the     first is very large but is subtotally occluded.  Beyond a very     small ostial nub, there is a greater than 99% stenosis.  There is     TIMI 2 flow in the distal OM-1.  Collateral filling is seen from     the distal branches of the left circumflex artery and later     collaterals were also seen from the posterolateral ventricular     artery branch of the right coronary artery.  Just beyond the  takeoff of the OM-1, there was a 95% stenosis in the main body of     the left circumflex coronary artery itself.  At least two other     important branches are seen from the left circumflex artery in its     distal portion. 4. The right coronary artery is a small to medium sized codominant     vessel.  There was a 50% to 60% stenosis in its midportion.  It     generates two branches to the inferior wall of the left ventricle. 5. The left ventricle is normal in size with regional wall motion and     overall systolic function with ejection fraction of over 55%.  The     left ventricular end-diastolic pressure was 22 mmHg.  There was no     evidence of aortic stenosis or mitral insufficiency.  CONCLUSION:  Grant [name] has three-vessel coronary artery disease including proximal to mid left anterior descending stenosis and a very complex bifurcation lesion of the left circumflex coronary artery.  He has diabetes  mellitus.  Despite the fact that he has preserved left ventricle systolic function and is reportedly minimally symptomatic, he would do best with bypass surgery.  The EKG changes are quite concerning and suggest extensive silent ischemia.  Ideally, he should receive bypasses to the mid LAD, OM-1, OM-2, plus or minus OM-3, and PDA.     Thurmon Fair, MD     MC/MEDQ  D:  02/19/2011  T:  02/20/2011  Job:  [id number]  cc:   [facility]. TCTS.  Electronically Signed by Thurmon Fair M.D. on 03/19/2011 03:56:54 PM

## 2011-03-23 ENCOUNTER — Other Ambulatory Visit: Payer: Self-pay | Admitting: Thoracic Surgery (Cardiothoracic Vascular Surgery)

## 2011-03-23 DIAGNOSIS — I2510 Atherosclerotic heart disease of native coronary artery without angina pectoris: Secondary | ICD-10-CM

## 2011-03-26 ENCOUNTER — Ambulatory Visit
Admission: RE | Admit: 2011-03-26 | Discharge: 2011-03-26 | Disposition: A | Discharge status: Home / Self Care | Payer: Medicare Other | Source: Ambulatory Visit | Attending: Thoracic Surgery (Cardiothoracic Vascular Surgery) | Admitting: Thoracic Surgery (Cardiothoracic Vascular Surgery)

## 2011-03-26 ENCOUNTER — Encounter (INDEPENDENT_AMBULATORY_CARE_PROVIDER_SITE_OTHER): Payer: Self-pay

## 2011-03-26 DIAGNOSIS — I2510 Atherosclerotic heart disease of native coronary artery without angina pectoris: Secondary | ICD-10-CM

## 2011-03-27 NOTE — Assessment & Plan Note (Signed)
OFFICE VISIT  Grant [name], Grant [name] DOB:  18-Sep-1934                                        March 26, 2011 CHART #:  [id number]  ADDENDUM:  IMPRESSION AND PLAN:  As previously stated, the patient states he has had a productive cough (clear sputum).  He was instructed he may take Mucinex over the counter to help with this also regarding driving.  The patient provided he is not taking any narcotics may begin driving short distances, i.e., 30 minutes or less, once he begins feeling better.  He should remain on the scheduled for approximately 1 week and then increased his frequency and duration as tolerates.  Finally, he was also encouraged he has to continue with the sternal precautions, i.e., no lifting more than 10 pounds for least 2 or 3 more weeks.  Doree Fudge, PA  DZ/MEDQ  D:  03/26/2011  T:  03/27/2011  Job:  [id number]

## 2011-03-27 NOTE — Assessment & Plan Note (Signed)
OFFICE VISIT  Grant [name], Grant [name] DOB:  May 12, 1934                                        March 26, 2011 CHART #:  [id number]  REASON FOR OFFICE VISIT:  Routine followup, status post CABG.  HISTORY OF PRESENTING ILLNESS:  This is a 77 year old Caucasian male who is status post CABG x4 (LIMA to LAD, SVG to OM1 and 2 sequentially, SVG to acute marginal on February 28, 2011).  The patient did have postoperative atrial fibrillation; however, he converted to sinus rhythm with amiodarone.  He also had complaints of gout in his left foot (mostly in his left great toe) and was given colchicine for this.  The patient states that since having been discharged from [facility] on March 07, 2011, he is felt somewhat weak, fatigued, has a cough with some clear sputum production, continues to have pain in his left foot mostly his second toe, and as of last night developed dry heaves, nausea and a couple of episodes of diarrhea.  He denies any shortness of breath, fever, chills or chest pain.  PHYSICAL EXAMINATION:  General:  This is a 77 year old Caucasian male who is in no acute distress who is alert, warm and cooperative, and accompanied by his wife.  Vital signs:  Blood pressure 145/79, heart rate 80, respiration rate 20, O2 sat 98% on room air, again, temperature 97.2.  Cardiovascular:  Regular rate and rhythm.  No murmurs, gallops, or rubs.  Pulmonary:  Decreased breath sounds at the left base, right lung clear.  Abdomen:  Soft, nontender.  Bowel sounds present.  No rebound, no guarding.  Extremities:  No cyanosis, clubbing or edema. Right lower extremity wound and sternal wound are clean, dry, and well healed.  Positive swelling in the left foot especially the second toe. Steri-Strips were removed from both previous chest tube sites.  The left chest tube site had skin edge dehiscence.  No erythema.  No purulence. No drainage.  Chest x-ray done today, showed no  pneumothorax, bilateral pleural effusions, trace on the right, greater on the left with left lower lobe atelectasis.  IMPRESSION AND PLAN: 1. [name] has already seen Dr. Royann Shivers in followup.  He has     discontinued his amiodarone as the patient remains in sinus rhythm.     He is currently taking Coreg 12.5 mg p.o. two times daily, Lipitor     40 mg p.o. at bedtime, enteric-coated aspirin 325 mg p.o. daily and     metformin 1000 mg p.o. twice daily.  Dr. Erin Hearing office did draw     a BMET, I am going to await the result of his creatinine as I would     like to give the patient Lasix for several days to help decrease     the left pleural effusion.  His last creatinine when he left the     hospital was down to 1.58 (the patient with history of chronic     kidney disease, stage II). 2. As previously stated, the patient did have gout in his left foot     prior to discharge and was given colchicine; however, this was     stopped as the patient did develop diarrhea.  He is not taking     colchicine in several days.  However, he recently started having  loose stools last evening.  In addition, he has nausea, decreased     appetite, and occasional dry heaves.  After review of his     medications, it does not appear that any of the medications he is     currently on would be the etiology of his gastrointestinal     symptoms.  He denies any abdominal pain or fever (his temperature     is 97.2 at today's office visit).  His primary medical care doctor     is Dr. Karleen Hampshire, we are going to contact his office and ask     if he could please evaluate the patient regarding his     gastrointestinal symptoms as well as his left foot pain (previous     history of gout). 3. The patient does have evidence of thrush in his mouth.  I have     given him a prescription for nystatin swish and swallow.  Regarding     the patient's decreased appetite, I have encouraged his wife to     continue  with Glucerna, a supplemental attrition until he is     tolerating p.o. better.  In addition, the patient states he is     probably lost 18-20 pounds in the last couple of weeks, as a result     of having a poor appetite.  Perhaps this was initially secondary to     the amiodarone, but as stated above this has been stopped since he     seen Dr. Royann Shivers on March 23, 2011.  Hopefully in time, his     appetite will improve.  Doree Fudge, PA  DZ/MEDQ  D:  03/26/2011  T:  03/27/2011  Job:  [id number]  cc:   Thurmon Fair, MD [name], M.D. Madelin Rear. Sherwood Gambler, MD

## 2011-04-09 NOTE — Discharge Summary (Signed)
  NAME:  Grant [name], Grant [name] NO.:  [id number]  MEDICAL RECORD NO.:  [id number]           PATIENT TYPE:  I  LOCATION:  2015                         FACILITY:  [facility]  PHYSICIAN:  Salvatore Decent. Dorris Fetch, M.D.DATE OF BIRTH:  July 05, 1934  DATE OF ADMISSION:  02/28/2011 DATE OF DISCHARGE:  03/07/2011                              DISCHARGE SUMMARY   ADDENDUM.  ADDITIONAL DISCHARGE DIAGNOSES:  Acute left foot pain felt secondary to gout.  Grant [name] was originally scheduled for discharge home on March 06, 2011. At the time of his evaluation on morning rounds, he was noted to have acute swelling and tenderness over his left ankle and the plantar surface of his left foot.  He reported a history of gout in the left foot, although it had previously only affected his great toe.  He was not noted to be significantly edematous in the remainder of the left leg and was otherwise stable.  Duplex of the lower extremities bilaterally was performed and was negative for DVT.  He was started on colchicine and his symptoms significantly improved.  He was reevaluated on the morning of March 07, 2011.  At that time, he was ambulating without difficulty.  The left foot swelling has significantly decreased, although he still had minimal tenderness over the left lateral foot and the plantar surface of his foot.  Otherwise, he has remained stable and afebrile.  He has been maintaining sinus rhythm.  His labs on postop day 6 show sodium 139, potassium 3.9, BUN 28, creatinine 1.56, which was stable.  He was seen and evaluated by Dr. Dorris Fetch on the date of this dictation and is deemed ready for discharge home at this time.  DISCHARGE MEDICATIONS: 1. Amiodarone 400 mg b.i.d. x14 days, then amiodarone 200 mg b.i.d. 2. Colchicine 0.6 mg b.i.d. 3. Oxycodone IR 5-10 mg q.3-4 h. p.r.n. for pain. 4. Enteric-coated aspirin 325 mg daily. 5. Carvedilol 12.5 mg b.i.d. 6. Amaryl 4 mg daily. 7. Lipitor 40  mg daily. 8. Metformin 1000 mg b.i.d. 9. PreserVision 1 capsule b.i.d. 10.Ranitidine 150 mg b.i.d. p.r.n.  Discharge instructions and followups are unchanged from the previously dictated discharge summary.  He may call our office if he experiences any problems after he gets home.     Grant [name], P.A.   ______________________________ [name], M.D.    GC/MEDQ  D:  03/07/2011  T:  03/07/2011  Job:  [id number]  cc:   Thurmon Fair, MD TCTS Office.  Electronically Signed by Weldon Inches. on 03/23/2011 02:20:51 PM Electronically Signed by Charlett Lango M.D. on 04/09/2011 16:07:37 PM

## 2011-04-16 ENCOUNTER — Other Ambulatory Visit: Payer: Self-pay | Admitting: Thoracic Surgery (Cardiothoracic Vascular Surgery)

## 2011-04-16 DIAGNOSIS — I2510 Atherosclerotic heart disease of native coronary artery without angina pectoris: Secondary | ICD-10-CM

## 2011-04-17 ENCOUNTER — Encounter: Payer: Medicare Other | Admitting: Thoracic Surgery (Cardiothoracic Vascular Surgery)

## 2011-04-17 ENCOUNTER — Ambulatory Visit
Admission: RE | Admit: 2011-04-17 | Discharge: 2011-04-17 | Disposition: A | Discharge status: Home / Self Care | Payer: Medicare Other | Source: Ambulatory Visit | Attending: Thoracic Surgery (Cardiothoracic Vascular Surgery) | Admitting: Thoracic Surgery (Cardiothoracic Vascular Surgery)

## 2011-04-17 DIAGNOSIS — I2510 Atherosclerotic heart disease of native coronary artery without angina pectoris: Secondary | ICD-10-CM

## 2011-04-23 ENCOUNTER — Other Ambulatory Visit: Payer: Self-pay | Admitting: Thoracic Surgery (Cardiothoracic Vascular Surgery)

## 2011-04-23 DIAGNOSIS — I2510 Atherosclerotic heart disease of native coronary artery without angina pectoris: Secondary | ICD-10-CM

## 2011-04-24 ENCOUNTER — Encounter: Payer: Self-pay | Admitting: Thoracic Surgery (Cardiothoracic Vascular Surgery)

## 2011-04-27 ENCOUNTER — Other Ambulatory Visit: Payer: Self-pay | Admitting: Thoracic Surgery (Cardiothoracic Vascular Surgery)

## 2011-04-27 DIAGNOSIS — I2510 Atherosclerotic heart disease of native coronary artery without angina pectoris: Secondary | ICD-10-CM

## 2011-05-01 ENCOUNTER — Encounter (INDEPENDENT_AMBULATORY_CARE_PROVIDER_SITE_OTHER): Payer: Self-pay | Admitting: Thoracic Surgery (Cardiothoracic Vascular Surgery)

## 2011-05-01 ENCOUNTER — Encounter (HOSPITAL_COMMUNITY): Admission: RE | Admit: 2011-05-01 | Payer: Medicare Other | Source: Ambulatory Visit

## 2011-05-01 DIAGNOSIS — I2510 Atherosclerotic heart disease of native coronary artery without angina pectoris: Secondary | ICD-10-CM

## 2011-05-02 NOTE — Assessment & Plan Note (Signed)
OFFICE VISIT  Grant [name], Grant [name]:  [birth year]                                        May 01, 2011 CHART #:  [id number]  REASON FOR VISIT:  Follow up after bypass surgery.  HISTORY OF PRESENT ILLNESS:  The patient is a 77 year old gentleman who had three-vessel disease and underwent coronary bypass grafting x4 back on February 28, 2011.  He most recently was seen in the office by PA on March 26, 2011, when he was having some problems with productive cough. He was treated with Mucinex and that subsequently has resolved.  He did have a small pleural effusion noted on chest x-ray about a month ago and now has a followup chest x-ray.  He states that he had been doing well. His biggest problem, and has been holding up his recovery has been gout. He has had gout flares in several different joints since his surgery. He has been on prednisone for that was going to start allopurinol once his prednisone is complete.  She is not having any incisional pain. He has been driving without any problems.  He feels well and was to increase activities.  CURRENT MEDICATIONS: 1. Metformin 1000 mg b.i.d. 2. Aspirin 81 mg daily. 3. Zantac 150 mg daily. 4. Glimepiride 4 mg daily. 5. Lipitor 40 mg  nightly. 6. Coreg 12.5 mg b.i.d. 7. PreserVision tablets b.i.d. 8. Zyrtec 10 mg daily p.r.n.  ALLERGIES:  He has no known drug allergies.  PHYSICAL EXAMINATION:  General:  The patient is a well-appearing 77 year old gentleman in no acute distress.  Vital signs:  Blood pressure 134/73, pulse 64, respirations 18, and oxygen saturation 99% on room air.  Lungs:  Clear with equal breath sounds bilaterally.  Cardiac: Regular rate and rhythm.  Normal S1 and S2.  No murmurs, rubs, or gallop.  Sternal incision is clean, dry, intact, and well-healed. Sternum is stable.  Extremities:  Without clubbing, cyanosis, or edema.  Chest x-ray shows decreased size of a small left pleural  effusion.  IMPRESSION:  The patient is doing well at this point in time.  He is now 8 weeks out from surgery.  He can start cardiac rehab on Monday.  He has been kind of delayed because of gout flare.  He has been treated with prednisone and we will start allopurinol once his prednisone was completed.  From my standpoint, he is unrestricted at this point in time he 8 weeks out his sternum feels well healed.  He can began lifting weights and do whatever exercise he feels up to and he has already been driving.  I did caution as he starts new exercises and increase his activities to do it slow and gradually.  He will be followed by Dr. Royann [name] and Dr. Regino [name].  I would be happy to see him back anytime if I can be of any further assistance with his care.  Grant [name], M.D. Electronically Signed  SCH/MEDQ  D:  05/01/2011  T:  05/02/2011  Job:  [id number]  cc:   Grant Fair, MD Grant [name], M.D.

## 2011-05-07 ENCOUNTER — Encounter (HOSPITAL_COMMUNITY): Payer: Medicare Other | Attending: Cardiovascular Disease

## 2011-05-07 DIAGNOSIS — I209 Angina pectoris, unspecified: Secondary | ICD-10-CM | POA: Insufficient documentation

## 2011-05-07 DIAGNOSIS — Z951 Presence of aortocoronary bypass graft: Secondary | ICD-10-CM | POA: Insufficient documentation

## 2011-05-07 DIAGNOSIS — I2510 Atherosclerotic heart disease of native coronary artery without angina pectoris: Secondary | ICD-10-CM | POA: Insufficient documentation

## 2011-05-09 ENCOUNTER — Encounter (HOSPITAL_COMMUNITY): Payer: Medicare Other

## 2011-05-11 ENCOUNTER — Encounter (HOSPITAL_COMMUNITY): Payer: Medicare Other

## 2011-05-14 ENCOUNTER — Encounter (HOSPITAL_COMMUNITY): Payer: Medicare Other

## 2011-05-16 ENCOUNTER — Encounter (HOSPITAL_COMMUNITY): Payer: Medicare Other

## 2011-05-18 ENCOUNTER — Encounter (HOSPITAL_COMMUNITY)
Admission: RE | Admit: 2011-05-18 | Discharge: 2011-05-18 | Payer: Medicare Other | Source: Ambulatory Visit | Attending: Cardiovascular Disease | Admitting: Cardiovascular Disease

## 2011-05-21 ENCOUNTER — Encounter (HOSPITAL_COMMUNITY): Payer: Medicare Other

## 2011-05-23 ENCOUNTER — Encounter (HOSPITAL_COMMUNITY): Payer: Medicare Other

## 2011-05-25 ENCOUNTER — Encounter (HOSPITAL_COMMUNITY): Payer: Medicare Other

## 2011-05-28 ENCOUNTER — Encounter (HOSPITAL_COMMUNITY): Payer: Medicare Other

## 2011-05-30 ENCOUNTER — Encounter (HOSPITAL_COMMUNITY): Payer: Medicare Other

## 2011-06-01 ENCOUNTER — Encounter (HOSPITAL_COMMUNITY): Payer: Medicare Other

## 2011-06-04 ENCOUNTER — Encounter (HOSPITAL_COMMUNITY): Payer: Medicare Other

## 2011-06-04 DIAGNOSIS — Z951 Presence of aortocoronary bypass graft: Secondary | ICD-10-CM | POA: Insufficient documentation

## 2011-06-04 DIAGNOSIS — I2510 Atherosclerotic heart disease of native coronary artery without angina pectoris: Secondary | ICD-10-CM | POA: Insufficient documentation

## 2011-06-04 DIAGNOSIS — I209 Angina pectoris, unspecified: Secondary | ICD-10-CM | POA: Insufficient documentation

## 2011-06-06 ENCOUNTER — Encounter (HOSPITAL_COMMUNITY): Payer: Medicare Other

## 2011-06-08 ENCOUNTER — Encounter (HOSPITAL_COMMUNITY)
Admission: RE | Admit: 2011-06-08 | Discharge: 2011-06-08 | Disposition: A | Discharge status: Home / Self Care | Payer: Medicare Other | Source: Ambulatory Visit | Attending: Cardiovascular Disease | Admitting: Cardiovascular Disease

## 2011-06-11 ENCOUNTER — Encounter (HOSPITAL_COMMUNITY)
Admission: RE | Admit: 2011-06-11 | Discharge: 2011-06-11 | Disposition: A | Discharge status: Home / Self Care | Payer: Medicare Other | Source: Ambulatory Visit | Attending: Cardiovascular Disease | Admitting: Cardiovascular Disease

## 2011-06-13 ENCOUNTER — Encounter (HOSPITAL_COMMUNITY)
Admission: RE | Admit: 2011-06-13 | Discharge: 2011-06-13 | Disposition: A | Discharge status: Home / Self Care | Payer: Medicare Other | Source: Ambulatory Visit | Attending: Cardiovascular Disease | Admitting: Cardiovascular Disease

## 2011-06-15 ENCOUNTER — Encounter (HOSPITAL_COMMUNITY)
Admission: RE | Admit: 2011-06-15 | Discharge: 2011-06-15 | Disposition: A | Discharge status: Home / Self Care | Payer: Medicare Other | Source: Ambulatory Visit | Attending: Cardiovascular Disease | Admitting: Cardiovascular Disease

## 2011-06-18 ENCOUNTER — Encounter (HOSPITAL_COMMUNITY): Payer: Medicare Other

## 2011-06-20 ENCOUNTER — Encounter (HOSPITAL_COMMUNITY): Payer: Medicare Other

## 2011-06-22 ENCOUNTER — Encounter (HOSPITAL_COMMUNITY): Payer: Medicare Other

## 2011-06-25 ENCOUNTER — Encounter (HOSPITAL_COMMUNITY): Payer: Medicare Other

## 2011-06-27 ENCOUNTER — Encounter (HOSPITAL_COMMUNITY): Payer: Medicare Other

## 2011-06-29 ENCOUNTER — Encounter (HOSPITAL_COMMUNITY): Payer: Medicare Other

## 2011-07-02 ENCOUNTER — Encounter (HOSPITAL_COMMUNITY)
Admission: RE | Admit: 2011-07-02 | Discharge: 2011-07-02 | Disposition: A | Discharge status: Home / Self Care | Payer: Medicare Other | Source: Ambulatory Visit | Attending: Cardiovascular Disease | Admitting: Cardiovascular Disease

## 2011-07-04 ENCOUNTER — Encounter (HOSPITAL_COMMUNITY)
Admission: RE | Admit: 2011-07-04 | Discharge: 2011-07-04 | Disposition: A | Discharge status: Home / Self Care | Payer: Medicare Other | Source: Ambulatory Visit | Attending: Cardiovascular Disease | Admitting: Cardiovascular Disease

## 2011-07-04 DIAGNOSIS — I2510 Atherosclerotic heart disease of native coronary artery without angina pectoris: Secondary | ICD-10-CM | POA: Insufficient documentation

## 2011-07-04 DIAGNOSIS — Z951 Presence of aortocoronary bypass graft: Secondary | ICD-10-CM | POA: Insufficient documentation

## 2011-07-04 DIAGNOSIS — I209 Angina pectoris, unspecified: Secondary | ICD-10-CM | POA: Insufficient documentation

## 2011-07-06 ENCOUNTER — Encounter (HOSPITAL_COMMUNITY)
Admission: RE | Admit: 2011-07-06 | Discharge: 2011-07-06 | Disposition: A | Discharge status: Home / Self Care | Payer: Medicare Other | Source: Ambulatory Visit | Attending: Cardiovascular Disease | Admitting: Cardiovascular Disease

## 2011-07-09 ENCOUNTER — Encounter (HOSPITAL_COMMUNITY)
Admission: RE | Admit: 2011-07-09 | Discharge: 2011-07-09 | Disposition: A | Discharge status: Home / Self Care | Payer: Medicare Other | Source: Ambulatory Visit | Attending: Cardiovascular Disease | Admitting: Cardiovascular Disease

## 2011-07-11 ENCOUNTER — Encounter (HOSPITAL_COMMUNITY)
Admission: RE | Admit: 2011-07-11 | Discharge: 2011-07-11 | Disposition: A | Discharge status: Home / Self Care | Payer: Medicare Other | Source: Ambulatory Visit | Attending: Cardiovascular Disease | Admitting: Cardiovascular Disease

## 2011-07-13 ENCOUNTER — Encounter (HOSPITAL_COMMUNITY)
Admission: RE | Admit: 2011-07-13 | Discharge: 2011-07-13 | Disposition: A | Discharge status: Home / Self Care | Payer: Medicare Other | Source: Ambulatory Visit | Attending: Cardiovascular Disease | Admitting: Cardiovascular Disease

## 2011-07-16 ENCOUNTER — Encounter (HOSPITAL_COMMUNITY)
Admission: RE | Admit: 2011-07-16 | Discharge: 2011-07-16 | Disposition: A | Discharge status: Home / Self Care | Payer: Medicare Other | Source: Ambulatory Visit | Attending: Cardiovascular Disease | Admitting: Cardiovascular Disease

## 2011-07-18 ENCOUNTER — Encounter (HOSPITAL_COMMUNITY)
Admission: RE | Admit: 2011-07-18 | Discharge: 2011-07-18 | Disposition: A | Discharge status: Home / Self Care | Payer: Medicare Other | Source: Ambulatory Visit | Attending: Cardiovascular Disease | Admitting: Cardiovascular Disease

## 2011-07-20 ENCOUNTER — Encounter (HOSPITAL_COMMUNITY)
Admission: RE | Admit: 2011-07-20 | Discharge: 2011-07-20 | Disposition: A | Discharge status: Home / Self Care | Payer: Medicare Other | Source: Ambulatory Visit | Attending: Cardiovascular Disease | Admitting: Cardiovascular Disease

## 2011-07-23 ENCOUNTER — Encounter (HOSPITAL_COMMUNITY)
Admission: RE | Admit: 2011-07-23 | Discharge: 2011-07-23 | Disposition: A | Discharge status: Home / Self Care | Payer: Medicare Other | Source: Ambulatory Visit | Attending: Cardiovascular Disease | Admitting: Cardiovascular Disease

## 2011-07-25 ENCOUNTER — Encounter (HOSPITAL_COMMUNITY)
Admission: RE | Admit: 2011-07-25 | Discharge: 2011-07-25 | Disposition: A | Discharge status: Home / Self Care | Payer: Medicare Other | Source: Ambulatory Visit | Attending: Cardiovascular Disease | Admitting: Cardiovascular Disease

## 2011-07-27 ENCOUNTER — Encounter (HOSPITAL_COMMUNITY)
Admission: RE | Admit: 2011-07-27 | Discharge: 2011-07-27 | Disposition: A | Discharge status: Home / Self Care | Payer: Medicare Other | Source: Ambulatory Visit | Attending: Cardiovascular Disease | Admitting: Cardiovascular Disease

## 2011-07-30 ENCOUNTER — Encounter (HOSPITAL_COMMUNITY)
Admission: RE | Admit: 2011-07-30 | Discharge: 2011-07-30 | Disposition: A | Discharge status: Home / Self Care | Payer: Medicare Other | Source: Ambulatory Visit | Attending: Cardiovascular Disease | Admitting: Cardiovascular Disease

## 2011-08-01 ENCOUNTER — Encounter (HOSPITAL_COMMUNITY)
Admission: RE | Admit: 2011-08-01 | Discharge: 2011-08-01 | Disposition: A | Discharge status: Home / Self Care | Payer: Medicare Other | Source: Ambulatory Visit | Attending: Cardiovascular Disease | Admitting: Cardiovascular Disease

## 2011-08-03 ENCOUNTER — Encounter (HOSPITAL_COMMUNITY)
Admission: RE | Admit: 2011-08-03 | Discharge: 2011-08-03 | Disposition: A | Discharge status: Home / Self Care | Payer: Medicare Other | Source: Ambulatory Visit | Attending: Cardiovascular Disease | Admitting: Cardiovascular Disease

## 2011-08-06 ENCOUNTER — Encounter (HOSPITAL_COMMUNITY): Payer: Medicare Other

## 2011-08-08 ENCOUNTER — Encounter (HOSPITAL_COMMUNITY)
Admission: RE | Admit: 2011-08-08 | Discharge: 2011-08-08 | Disposition: A | Discharge status: Home / Self Care | Payer: Medicare Other | Source: Ambulatory Visit | Attending: Cardiovascular Disease | Admitting: Cardiovascular Disease

## 2011-08-08 DIAGNOSIS — Z951 Presence of aortocoronary bypass graft: Secondary | ICD-10-CM | POA: Insufficient documentation

## 2011-08-08 DIAGNOSIS — I209 Angina pectoris, unspecified: Secondary | ICD-10-CM | POA: Insufficient documentation

## 2011-08-08 DIAGNOSIS — I2510 Atherosclerotic heart disease of native coronary artery without angina pectoris: Secondary | ICD-10-CM | POA: Insufficient documentation

## 2011-08-10 ENCOUNTER — Encounter (HOSPITAL_COMMUNITY)
Admission: RE | Admit: 2011-08-10 | Discharge: 2011-08-10 | Disposition: A | Discharge status: Home / Self Care | Payer: Medicare Other | Source: Ambulatory Visit | Attending: Cardiovascular Disease | Admitting: Cardiovascular Disease

## 2011-08-13 ENCOUNTER — Encounter (HOSPITAL_COMMUNITY): Payer: Medicare Other

## 2011-08-15 ENCOUNTER — Encounter (HOSPITAL_COMMUNITY): Payer: Medicare Other

## 2011-08-17 ENCOUNTER — Encounter (HOSPITAL_COMMUNITY)
Admission: RE | Admit: 2011-08-17 | Discharge: 2011-08-17 | Disposition: A | Discharge status: Home / Self Care | Payer: Medicare Other | Source: Ambulatory Visit | Attending: Cardiovascular Disease | Admitting: Cardiovascular Disease

## 2011-08-20 ENCOUNTER — Encounter (HOSPITAL_COMMUNITY)
Admission: RE | Admit: 2011-08-20 | Discharge: 2011-08-20 | Disposition: A | Discharge status: Home / Self Care | Payer: Medicare Other | Source: Ambulatory Visit | Attending: Cardiovascular Disease | Admitting: Cardiovascular Disease

## 2011-08-22 ENCOUNTER — Encounter (HOSPITAL_COMMUNITY)
Admission: RE | Admit: 2011-08-22 | Discharge: 2011-08-22 | Disposition: A | Discharge status: Home / Self Care | Payer: Medicare Other | Source: Ambulatory Visit | Attending: Cardiovascular Disease | Admitting: Cardiovascular Disease

## 2011-08-24 ENCOUNTER — Encounter (HOSPITAL_COMMUNITY)
Admission: RE | Admit: 2011-08-24 | Discharge: 2011-08-24 | Disposition: A | Discharge status: Home / Self Care | Payer: Medicare Other | Source: Ambulatory Visit | Attending: Cardiovascular Disease | Admitting: Cardiovascular Disease

## 2011-08-27 ENCOUNTER — Encounter (HOSPITAL_COMMUNITY)
Admission: RE | Admit: 2011-08-27 | Discharge: 2011-08-27 | Disposition: A | Discharge status: Home / Self Care | Payer: Medicare Other | Source: Ambulatory Visit | Attending: Cardiovascular Disease | Admitting: Cardiovascular Disease

## 2011-08-29 ENCOUNTER — Encounter (HOSPITAL_COMMUNITY)
Admission: RE | Admit: 2011-08-29 | Discharge: 2011-08-29 | Disposition: A | Discharge status: Home / Self Care | Payer: Medicare Other | Source: Ambulatory Visit | Attending: Cardiovascular Disease | Admitting: Cardiovascular Disease

## 2011-10-17 NOTE — Progress Notes (Signed)
Cardiac Rehab Progress Report  Orientation:  05/01/2011 Graduate Date:  08/29/2011 Discharge Date:  08/29/2011  Cardiologist:  Croitoru Family MD:  Regino Schultze Class Time:  09:30  A.  Exercise Program:  Tolerates exercise @ 3.4 METS for 15 minutes Discharged  B.  Mental Health:  Good mental attitude  C.  Education/Instruction/Skills  Knows THR for exercise, Uses Perceived Exertion Scale and/or Dyspnea Scale and Attended all education classes  D.  Nutrition/Weight Control/Body Composition:  Adherence to prescribed nutrition program: good   E.  Blood Lipids    No results found for this basename: CHOL     No results found for this basename: TRIG     No results found for this basename: HDL     No results found for this basename: CHOLHDL     No results found for this basename: LDLDIRECT      F.  Lifestyle Changes:  Making positive lifestyle changes  G.  Symptoms noted with exercise:  Asymptomatic  Report Completed By:  Angelica Pou  Comments:  Mr. Pew has progressed nicely to 30 minutes of aerobic exercise @ Max MET level of 3.4 and 10 minutes of strength and flexibility exercises. All vital have been WNL. Patient has met with with dietician. DC instructions has been discussed in detail, Verbalized understanding. Plans to join a local Gym for continued exercise. Rehab staff will contact patient at 1 month, 6 months and 1 year. Patient had no Complaints of pain or any abnormal S/S.

## 2011-10-17 NOTE — Progress Notes (Signed)
Cardiac Rehab Progress Report  Orientation:  05/01/2011 Graduate Date:  tbd Discharge Date:  tbd  Cardiologist: Croitoru Family MD:  Kevan Rosebush Time:  09:30  A.  Exercise Program:  Tolerates exercise @ 3.1 METS for 15 minutes  B.  Mental Health:  Good mental attitude  C.  Education/Instruction/Skills  Knows THR for exercise and Uses Perceived Exertion Scale and/or Dyspnea Scale  D.  Nutrition/Weight Control/Body Composition:  Adherence to prescribed nutrition program: good   E.  Blood Lipids    No results found for this basename: CHOL     No results found for this basename: TRIG     No results found for this basename: HDL     No results found for this basename: CHOLHDL     No results found for this basename: LDLDIRECT      F.  Lifestyle Changes:  Making positive lifestyle changes  G.  Symptoms noted with exercise:  Asymptomatic  Report Completed By:  Angelica Pou  Comments:  This is patients half way report. He  Achieved a peak mets of 3.1. His resting HR is  62 and resting BP is140/60. His peak  HR is 93 and peak BP is 152/58. He has done very well. We will send a final report on his graduation.

## 2011-10-17 NOTE — Progress Notes (Signed)
Cardiac Rehab Progress Report  Orientation:  05/01/2011 Graduate Date:  tbd Discharge Date:  tbd  Cardiologist:  Croitoru Family MD:  Kevan Rosebush Time:  09:30  A.  Exercise Program:  Tolerates exercise @ 1.8 METS for 15 minutes  B.  Mental Health:  Good mental attitude  C.  Education/Instruction/Skills  Knows THR for exercise and Uses Perceived Exertion Scale and/or Dyspnea Scale  D.  Nutrition/Weight Control/Body Composition:  Adherence to prescribed nutrition program: good   E.  Blood Lipids    No results found for this basename: CHOL     No results found for this basename: TRIG     No results found for this basename: HDL     No results found for this basename: CHOLHDL     No results found for this basename: LDLDIRECT      F.  Lifestyle Changes:  Making positive lifestyle changes  G.  Symptoms noted with exercise:  Asymptomatic  Report Completed By:  Angelica Pou  Comments:  This is patients 1st week report. He achieved a peak mets of 1.8. He has done very well. His resting HR was 71 and BP 138/62 and Peak HR 91 and peak BP 170/70. A report will follow at his 18th session. (Halfway)

## 2011-12-05 ENCOUNTER — Telehealth: Payer: Self-pay

## 2011-12-05 NOTE — Telephone Encounter (Signed)
Called pt to schedule colonoscopy. He is not having any rectal bleeding or any GI problems at this time. He does have gout in one of his hands and it has been very painful. He would like to wait til this is better, and he will give me a call when he is ready to schedule colonoscopy.

## 2011-12-12 DIAGNOSIS — E1149 Type 2 diabetes mellitus with other diabetic neurological complication: Secondary | ICD-10-CM | POA: Diagnosis not present

## 2011-12-12 DIAGNOSIS — E119 Type 2 diabetes mellitus without complications: Secondary | ICD-10-CM | POA: Diagnosis not present

## 2011-12-17 DIAGNOSIS — L570 Actinic keratosis: Secondary | ICD-10-CM | POA: Diagnosis not present

## 2011-12-24 DIAGNOSIS — J309 Allergic rhinitis, unspecified: Secondary | ICD-10-CM | POA: Diagnosis not present

## 2011-12-25 DIAGNOSIS — J309 Allergic rhinitis, unspecified: Secondary | ICD-10-CM | POA: Diagnosis not present

## 2011-12-27 DIAGNOSIS — E291 Testicular hypofunction: Secondary | ICD-10-CM | POA: Diagnosis not present

## 2012-01-03 DIAGNOSIS — J309 Allergic rhinitis, unspecified: Secondary | ICD-10-CM | POA: Diagnosis not present

## 2012-01-10 DIAGNOSIS — J309 Allergic rhinitis, unspecified: Secondary | ICD-10-CM | POA: Diagnosis not present

## 2012-01-14 ENCOUNTER — Telehealth: Payer: Self-pay

## 2012-01-14 NOTE — Telephone Encounter (Signed)
Pt's called want to schedule his colonoscopy so if you can please call him back Tuesday at get hi set up for his colonoscopy.

## 2012-01-17 ENCOUNTER — Telehealth: Payer: Self-pay

## 2012-01-17 ENCOUNTER — Other Ambulatory Visit: Payer: Self-pay

## 2012-01-17 DIAGNOSIS — Z139 Encounter for screening, unspecified: Secondary | ICD-10-CM

## 2012-01-17 NOTE — Telephone Encounter (Signed)
See new phone note of 01/17/2012. Pt has been scheduled for colonoscopy.

## 2012-01-17 NOTE — Telephone Encounter (Signed)
Gastroenterology Pre-Procedure Form   Pt's wife gave the triage information  Pt had Bypass surgery March 2012 and is doing well   Request Date: 02/25/2012               Requesting Physician: Dr. Regino Schultze     PATIENT INFORMATION:  Grant [name] is a 78 y.o., male (DOB=December 29, 1933).  PROCEDURE: Procedure(s) requested: colonoscopy Procedure Reason: screening for colon cancer  PATIENT REVIEW QUESTIONS: The patient reports the following:   1. Diabetes Melitis: yes  2. Joint replacements in the past 12 months: no 3. Major health problems in the past 3 months: no 4. Has an artificial valve or MVP:no 5. Has been advised in past to take antibiotics in advance of a procedure like teeth cleaning: no}    MEDICATIONS & ALLERGIES:    Patient reports the following regarding taking any blood thinners:   Plavix? no Aspirin?yes  Coumadin?  no  Patient confirms/reports the following medications:  Current Outpatient Prescriptions  Medication Sig Dispense Refill  . aspirin 325 MG tablet Take 325 mg by mouth daily.      Marland Kitchen atorvastatin (LIPITOR) 80 MG tablet Take 80 mg by mouth daily. Pt takes 1/2 tablet in the morning and 1/2 tablet in the evening      . carvedilol (COREG) 12.5 MG tablet Take 12.5 mg by mouth 2 (two) times daily with a meal.      . glimepiride (AMARYL) 4 MG tablet Take 4 mg by mouth daily before breakfast.      . metFORMIN (GLUCOPHAGE) 500 MG tablet Take 500 mg by mouth 2 (two) times daily with a meal.      . Multiple Vitamin (MULTIVITAMIN) tablet Take 1 tablet by mouth daily.      . NON FORMULARY Occuvite   One tablet daily      . quinapril (ACCUPRIL) 20 MG tablet Take 20 mg by mouth at bedtime.        Patient confirms/reports the following allergies:  No Known Allergies  Patient is appropriate to schedule for requested procedure(s): yes  AUTHORIZATION INFORMATION Primary Insurance:   ID #:   Group #:  Pre-Cert / Auth required:  Pre-Cert / Auth #:   Secondary  Insurance:   ID #:   Group #:  Pre-Cert / Auth required: Pre-Cert / Auth #:   No orders of the defined types were placed in this encounter.    SCHEDULE INFORMATION: Procedure has been scheduled as follows:  Date: 02/25/2012                    Time: 7:30 AM  Location: [facility] Short Stay  This Gastroenterology Pre-Precedure Form is being routed to the following provider(s) for review: R. Roetta Sessions, MD

## 2012-01-17 NOTE — Telephone Encounter (Signed)
Day of prep: amaryl 2mg  before bf Metformin 250mg  bid

## 2012-01-18 NOTE — Telephone Encounter (Signed)
Rx and instructions mailed to pt.  

## 2012-02-06 DIAGNOSIS — E291 Testicular hypofunction: Secondary | ICD-10-CM | POA: Diagnosis not present

## 2012-02-07 ENCOUNTER — Telehealth: Payer: Self-pay

## 2012-02-07 DIAGNOSIS — J309 Allergic rhinitis, unspecified: Secondary | ICD-10-CM | POA: Diagnosis not present

## 2012-02-07 NOTE — Telephone Encounter (Signed)
Pt is aware of his instructions.

## 2012-02-07 NOTE — Telephone Encounter (Signed)
Called to update triage. No new medical problems and no new meds.

## 2012-02-07 NOTE — Telephone Encounter (Signed)
Day of prep: Metformin 250mg  bid. Amaryl 2mg  before bf.

## 2012-02-14 DIAGNOSIS — J309 Allergic rhinitis, unspecified: Secondary | ICD-10-CM | POA: Diagnosis not present

## 2012-02-15 MED ORDER — GLYCOPYRROLATE 0.2 MG/ML IJ SOLN
INTRAMUSCULAR | Status: AC
  Filled 2012-02-15: qty 1

## 2012-02-18 ENCOUNTER — Encounter (HOSPITAL_COMMUNITY): Payer: Self-pay | Admitting: Pharmacy Technician

## 2012-02-20 DIAGNOSIS — E1149 Type 2 diabetes mellitus with other diabetic neurological complication: Secondary | ICD-10-CM | POA: Diagnosis not present

## 2012-02-20 DIAGNOSIS — E119 Type 2 diabetes mellitus without complications: Secondary | ICD-10-CM | POA: Diagnosis not present

## 2012-02-20 DIAGNOSIS — J309 Allergic rhinitis, unspecified: Secondary | ICD-10-CM | POA: Diagnosis not present

## 2012-02-22 MED ORDER — SODIUM CHLORIDE 0.45 % IV SOLN
Freq: Once | INTRAVENOUS | Status: AC
  Administered 2012-02-25: 07:00:00 via INTRAVENOUS

## 2012-02-25 ENCOUNTER — Encounter (HOSPITAL_COMMUNITY)
Admission: RE | Disposition: A | Discharge status: Home / Self Care | Payer: Self-pay | Source: Ambulatory Visit | Attending: Internal Medicine

## 2012-02-25 ENCOUNTER — Encounter (HOSPITAL_COMMUNITY): Payer: Self-pay

## 2012-02-25 ENCOUNTER — Ambulatory Visit (HOSPITAL_COMMUNITY)
Admission: RE | Admit: 2012-02-25 | Discharge: 2012-02-25 | Disposition: A | Discharge status: Home / Self Care | Payer: Medicare Other | Source: Ambulatory Visit | Attending: Internal Medicine | Admitting: Internal Medicine

## 2012-02-25 DIAGNOSIS — D126 Benign neoplasm of colon, unspecified: Secondary | ICD-10-CM | POA: Insufficient documentation

## 2012-02-25 DIAGNOSIS — K621 Rectal polyp: Secondary | ICD-10-CM

## 2012-02-25 DIAGNOSIS — Z1211 Encounter for screening for malignant neoplasm of colon: Secondary | ICD-10-CM | POA: Insufficient documentation

## 2012-02-25 DIAGNOSIS — K620 Anal polyp: Secondary | ICD-10-CM | POA: Diagnosis not present

## 2012-02-25 DIAGNOSIS — Z951 Presence of aortocoronary bypass graft: Secondary | ICD-10-CM | POA: Insufficient documentation

## 2012-02-25 DIAGNOSIS — I10 Essential (primary) hypertension: Secondary | ICD-10-CM | POA: Diagnosis not present

## 2012-02-25 DIAGNOSIS — D128 Benign neoplasm of rectum: Secondary | ICD-10-CM | POA: Diagnosis not present

## 2012-02-25 DIAGNOSIS — D129 Benign neoplasm of anus and anal canal: Secondary | ICD-10-CM | POA: Diagnosis not present

## 2012-02-25 DIAGNOSIS — Z01812 Encounter for preprocedural laboratory examination: Secondary | ICD-10-CM | POA: Diagnosis not present

## 2012-02-25 DIAGNOSIS — Z79899 Other long term (current) drug therapy: Secondary | ICD-10-CM | POA: Diagnosis not present

## 2012-02-25 DIAGNOSIS — E119 Type 2 diabetes mellitus without complications: Secondary | ICD-10-CM | POA: Diagnosis not present

## 2012-02-25 DIAGNOSIS — K5730 Diverticulosis of large intestine without perforation or abscess without bleeding: Secondary | ICD-10-CM | POA: Insufficient documentation

## 2012-02-25 DIAGNOSIS — Z139 Encounter for screening, unspecified: Secondary | ICD-10-CM

## 2012-02-25 HISTORY — DX: Essential (primary) hypertension: I10

## 2012-02-25 HISTORY — PX: COLONOSCOPY: SHX5424

## 2012-02-25 HISTORY — DX: Gastro-esophageal reflux disease without esophagitis: K21.9

## 2012-02-25 HISTORY — DX: Unspecified macular degeneration: H35.30

## 2012-02-25 HISTORY — DX: Unspecified osteoarthritis, unspecified site: M19.90

## 2012-02-25 HISTORY — DX: Malignant (primary) neoplasm, unspecified: C80.1

## 2012-02-25 LAB — GLUCOSE, CAPILLARY: Glucose-Capillary: 70 mg/dL (ref 70–99)

## 2012-02-25 SURGERY — COLONOSCOPY
Anesthesia: Moderate Sedation

## 2012-02-25 MED ORDER — DEXTROSE IN LACTATED RINGERS 5 % IV SOLN
INTRAVENOUS | Status: DC
  Administered 2012-02-25 (×2): 50 mL/h via INTRAVENOUS

## 2012-02-25 MED ORDER — MIDAZOLAM HCL 5 MG/5ML IJ SOLN
INTRAMUSCULAR | Status: DC | PRN
  Administered 2012-02-25: 1 mg via INTRAVENOUS
  Administered 2012-02-25: 2 mg via INTRAVENOUS
  Administered 2012-02-25: 1 mg via INTRAVENOUS

## 2012-02-25 MED ORDER — STERILE WATER FOR IRRIGATION IR SOLN
Status: DC | PRN
  Administered 2012-02-25: 08:00:00

## 2012-02-25 MED ORDER — MEPERIDINE HCL 100 MG/ML IJ SOLN
INTRAMUSCULAR | Status: AC
  Filled 2012-02-25: qty 2

## 2012-02-25 MED ORDER — MEPERIDINE HCL 100 MG/ML IJ SOLN
INTRAMUSCULAR | Status: AC
  Filled 2012-02-25: qty 1

## 2012-02-25 MED ORDER — MEPERIDINE HCL 100 MG/ML IJ SOLN
INTRAMUSCULAR | Status: DC | PRN
  Administered 2012-02-25 (×3): 25 mg via INTRAVENOUS

## 2012-02-25 MED ORDER — MIDAZOLAM HCL 5 MG/5ML IJ SOLN
INTRAMUSCULAR | Status: AC
  Filled 2012-02-25: qty 10

## 2012-02-25 NOTE — H&P (Signed)
Primary Care Physician:  [name], MD, MD Primary Gastroenterologist:  Dr. Jena Gauss  Pre-Procedure History & Physical: HPI:  Grant [name] is a 78 y.o. male is here for a screening colonoscopy. No prior colonoscopy. No GI symptoms. No family history of colon cancer. Positive family history: Positive and his mother but at advanced age.  Past Medical History  Diagnosis Date  . Macular degeneration   . Diabetes mellitus   . Hypertension   . GERD (gastroesophageal reflux disease)   . Gout   . Arthritis   . Cancer     skin cancer of forehead    Past Surgical History  Procedure Date  . Coronary artery bypass graft 02-28-2011    4 vessels  . Pilonidal cyst excision     Prior to Admission medications   Medication Sig Start Date End Date Taking? Authorizing Provider  allopurinol (ZYLOPRIM) 100 MG tablet Take 100 mg by mouth daily.   Yes Historical Provider, MD  aspirin 325 MG tablet Take 325 mg by mouth daily.   Yes Historical Provider, MD  atorvastatin (LIPITOR) 80 MG tablet Take 40 mg by mouth daily.    Yes Historical Provider, MD  beta carotene w/minerals (OCUVITE) tablet Take 2 tablets by mouth daily.    Yes Historical Provider, MD  carvedilol (COREG) 12.5 MG tablet Take 12.5 mg by mouth 2 (two) times daily with a meal.   Yes Historical Provider, MD  glimepiride (AMARYL) 4 MG tablet Take 4 mg by mouth daily before breakfast.   Yes Historical Provider, MD  metFORMIN (GLUCOPHAGE) 500 MG tablet Take 500 mg by mouth 2 (two) times daily with a meal.   Yes Historical Provider, MD  Multiple Vitamins-Iron (MULTIVITAMINS WITH IRON) TABS Take 1 tablet by mouth daily.   Yes Historical Provider, MD  quinapril (ACCUPRIL) 20 MG tablet Take 20 mg by mouth daily.    Yes Historical Provider, MD  ranitidine (ZANTAC) 150 MG tablet Take 150 mg by mouth 2 (two) times daily.   Yes Historical Provider, MD    Allergies as of 01/17/2012  . (No Known Allergies)    Family History  Problem  Relation Age of Onset  . Anesthesia problems Neg Hx   . Hypotension Neg Hx   . Malignant hyperthermia Neg Hx   . Pseudochol deficiency Neg Hx     History   Social History  . Marital Status: Married    Spouse Name: N/A    Number of Children: N/A  . Years of Education: N/A   Occupational History  . Not on file.   Social History Main Topics  . Smoking status: Former Smoker -- 0.5 packs/day for 25 years    Types: Cigarettes    Quit date: 02/25/1983  . Smokeless tobacco: Not on file  . Alcohol Use: Yes     occasional  . Drug Use: No  . Sexually Active: No   Other Topics Concern  . Not on file   Social History Narrative  . No narrative on file    Review of Systems: See HPI, otherwise negative ROS  Physical Exam: BP 161/76  Pulse 64  Temp(Src) 97.8 F (36.6 C) (Oral)  Resp 18  Ht 6' (1.829 m)  Wt 200 lb (90.719 kg)  BMI 27.12 kg/m2  SpO2 100% General:   Alert,  Well-developed, well-nourished, pleasant and cooperative in NAD Head:  Normocephalic and atraumatic. Eyes:  Sclera clear, no icterus.   Conjunctiva pink. Ears:  Normal auditory acuity. Nose:  No  deformity, discharge,  or lesions. Mouth:  No deformity or lesions, dentition normal. Neck:  Supple; no masses or thyromegaly. Lungs:  Clear throughout to auscultation.   No wheezes, crackles, or rhonchi. No acute distress. Heart:  Regular rate and rhythm; no murmurs, clicks, rubs,  or gallops. Abdomen:  Soft, nontender and nondistended. No masses, hepatosplenomegaly or hernias noted. Normal bowel sounds, without guarding, and without rebound.   Msk:  Symmetrical without gross deformities. Normal posture. Pulses:  Normal pulses noted. Extremities:  Without clubbing or edema. Neurologic:  Alert and  oriented x4;  grossly normal neurologically. Skin:  Intact without significant lesions or rashes. Cervical Nodes:  No significant cervical adenopathy. Psych:  Alert and cooperative. Normal mood and  affect.  Impression/Plan: Grant [name] is now here to undergo a screening colonoscopy.  First ever average risk screening examination.  The risks, benefits, limitations, imponderables and alternatives regarding colonoscopy have been reviewed with the patient. Questions have been answered. All parties agreeable.

## 2012-02-25 NOTE — Discharge Instructions (Addendum)
Colonoscopy Discharge Instructions  Read the instructions outlined below and refer to this sheet in the next few weeks. These discharge instructions provide you with general information on caring for yourself after you leave the hospital. Your doctor may also give you specific instructions. While your treatment has been planned according to the most current medical practices available, unavoidable complications occasionally occur. If you have any problems or questions after discharge, call Dr. Jena Gauss at [phone]. ACTIVITY  You may resume your regular activity, but move at a slower pace for the next 24 hours.   Take frequent rest periods for the next 24 hours.   Walking will help get rid of the air and reduce the bloated feeling in your belly (abdomen).   No driving for 24 hours (because of the medicine (anesthesia) used during the test).    Do not sign any important legal documents or operate any machinery for 24 hours (because of the anesthesia used during the test).  NUTRITION  Drink plenty of fluids.   You may resume your normal diet as instructed by your doctor.   Begin with a light meal and progress to your normal diet. Heavy or fried foods are harder to digest and may make you feel sick to your stomach (nauseated).   Avoid alcoholic beverages for 24 hours or as instructed.  MEDICATIONS  You may resume your normal medications unless your doctor tells you otherwise.  WHAT YOU CAN EXPECT TODAY  Some feelings of bloating in the abdomen.   Passage of more gas than usual.   Spotting of blood in your stool or on the toilet paper.  IF YOU HAD POLYPS REMOVED DURING THE COLONOSCOPY:  No aspirin products for 7 days or as instructed.   No alcohol for 7 days or as instructed.   Eat a soft diet for the next 24 hours.  FINDING OUT THE RESULTS OF YOUR TEST Not all test results are available during your visit. If your test results are not back during the visit, make an appointment  with your caregiver to find out the results. Do not assume everything is normal if you have not heard from your caregiver or the medical facility. It is important for you to follow up on all of your test results.  SEEK IMMEDIATE MEDICAL ATTENTION IF:  You have more than a spotting of blood in your stool.   Your belly is swollen (abdominal distention).   You are nauseated or vomiting.   You have a temperature over 101.   You have abdominal pain or discomfort that is severe or gets worse throughout the day.   Colon Polyps A polyp is extra tissue that grows inside your body. Colon polyps grow in the large intestine. The large intestine, also called the colon, is part of your digestive system. It is a long, hollow tube at the end of your digestive tract where your body makes and stores stool. Most polyps are not dangerous. They are benign. This means they are not cancerous. But over time, some types of polyps can turn into cancer. Polyps that are smaller than a pea are usually not harmful. But larger polyps could someday become or may already be cancerous. To be safe, doctors remove all polyps and test them.  WHO GETS POLYPS? Anyone can get polyps, but certain people are more likely than others. You may have a greater chance of getting polyps if:  You are over 50.   You have had polyps before.   Someone in  your family has had polyps.   Someone in your family has had cancer of the large intestine.   Find out if someone in your family has had polyps. You may also be more likely to get polyps if you:   Eat a lot of fatty foods.   Smoke.   Drink alcohol.   Do not exercise.   Eat too much.  SYMPTOMS  Most small polyps do not cause symptoms. People often do not know they have one until their caregiver finds it during a regular checkup or while testing them for something else. Some people do have symptoms like these:  Bleeding from the anus. You might notice blood on your underwear or  on toilet paper after you have had a bowel movement.   Constipation or diarrhea that lasts more than a week.   Blood in the stool. Blood can make stool look black or it can show up as red streaks in the stool.  If you have any of these symptoms, see your caregiver. HOW DOES THE DOCTOR TEST FOR POLYPS? The doctor can use four tests to check for polyps:  Digital rectal exam. The caregiver wears gloves and checks your rectum (the last part of the large intestine) to see if it feels normal. This test would find polyps only in the rectum. Your caregiver may need to do one of the other tests listed below to find polyps higher up in the intestine.   Barium enema. The caregiver puts a liquid called barium into your rectum before taking x-rays of your large intestine. Barium makes your intestine look white in the pictures. Polyps are dark, so they are easy to see.   Sigmoidoscopy. With this test, the caregiver can see inside your large intestine. A thin flexible tube is placed into your rectum. The device is called a sigmoidoscope, which has a light and a tiny video camera in it. The caregiver uses the sigmoidoscope to look at the last third of your large intestine.   Colonoscopy. This test is like sigmoidoscopy, but the caregiver looks at all of the large intestine. It usually requires sedation. This is the most common method for finding and removing polyps.  TREATMENT   The caregiver will remove the polyp during sigmoidoscopy or colonoscopy. The polyp is then tested for cancer.   If you have had polyps, your caregiver may want you to get tested regularly in the future.  PREVENTION  There is not one sure way to prevent polyps. You might be able to lower your risk of getting them if you:  Eat more fruits and vegetables and less fatty food.   Do not smoke.   Avoid alcohol.   Exercise every day.   Lose weight if you are overweight.   Eating more calcium and folate can also lower your risk of  getting polyps. Some foods that are rich in calcium are milk, cheese, and broccoli. Some foods that are rich in folate are chickpeas, kidney beans, and spinach.   Aspirin might help prevent polyps. Studies are under way.  Document Released: 08/15/2004 Document Revised: 11/08/2011 Document Reviewed: 01/21/2008 [facility] Patient Information 2012 [place], [place]. Diverticulosis Diverticulosis is a common condition that develops when small pouches (diverticula) form in the wall of the colon. The risk of diverticulosis increases with age. It happens more often in people who eat a low-fiber diet. Most individuals with diverticulosis have no symptoms. Those individuals with symptoms usually experience abdominal pain, constipation, or loose stools (diarrhea). HOME CARE INSTRUCTIONS  Increase the amount of fiber in your diet as directed by your caregiver or dietician. This may reduce symptoms of diverticulosis.   Your caregiver may recommend taking a dietary fiber supplement.   Drink at least 6 to 8 glasses of water each day to prevent constipation.   Try not to strain when you have a bowel movement.   Your caregiver may recommend avoiding nuts and seeds to prevent complications, although this is still an uncertain benefit.   Only take over-the-counter or prescription medicines for pain, discomfort, or fever as directed by your caregiver.  FOODS WITH HIGH FIBER CONTENT INCLUDE:  Fruits. Apple, peach, pear, tangerine, raisins, prunes.   Vegetables. Brussels sprouts, asparagus, broccoli, cabbage, carrot, cauliflower, romaine lettuce, spinach, summer squash, tomato, winter squash, zucchini.   Starchy Vegetables. Baked beans, kidney beans, lima beans, split peas, lentils, potatoes (with skin).   Grains. Whole wheat bread, brown rice, bran flake cereal, plain oatmeal, white rice, shredded wheat, bran muffins.  SEEK IMMEDIATE MEDICAL CARE IF:   You develop increasing pain or severe bloating.   You  have an oral temperature above 102 F (38.9 C), not controlled by medicine.   You develop vomiting or bowel movements that are bloody or black.  Document Released: 08/16/2004 Document Revised: 11/08/2011 Document Reviewed: 04/19/2010 [facility] Patient Information 2012 [place], [place].  Polyp and diverticulosis information provided  Further recommendations to follow pending review of pathology report

## 2012-02-25 NOTE — OR Nursing (Addendum)
D-5 LR 500cc IV started per verbal order Dr Jena Gauss for blood sugar of 70.

## 2012-02-25 NOTE — Op Note (Signed)
[facility] [address] [place], [place]  [address]  COLONOSCOPY PROCEDURE REPORT  PATIENT:  Grant [name], Grant [name]  MR#:  [id number] BIRTHDATE:  02/22/1934, 78 yrs. old  GENDER:  male ENDOSCOPIST:  R. Roetta Sessions, MD FACP [facility] REF. BY:  Karleen Hampshire, M.D. PROCEDURE DATE:  02/25/2012 PROCEDURE:  Colonoscopy with multiple polyp ablations and snare polypectomies  INDICATIONS:  First-ever average risk screening colonoscopy.  INFORMED CONSENT:  The risks, benefits, alternatives and imponderables including but not limited to bleeding, perforation as well as the possibility of a missed lesion have been reviewed. The potential for biopsy, lesion removal, etc. have also been discussed.  Questions have been answered.  All parties agreeable. Please see the history and physical in the medical record for more information.  MEDICATIONS:  Versed 4 mg IV and Demerol 75 mg IV in divided doses.  DESCRIPTION OF PROCEDURE:  After a digital rectal exam was performed, the EC-[id number] ([id number]) colonoscope was advanced from the anus through the rectum and colon to the area of the cecum, ileocecal valve and appendiceal orifice.  The cecum was deeply intubated.  These structures were well-seen and photographed for the record.  From the level of the cecum and ileocecal valve, the scope was slowly and cautiously withdrawn.  The mucosal surfaces were carefully surveyed utilizing scope tip deflection to facilitate fold flattening as needed.  The scope was pulled down into the rectum where a thorough examination including retroflexion was performed. <<PROCEDUREIMAGES>>  FINDINGS: Adequate preparation. A 4 mm polyp in the rectum at 4 cm; otherwise normal rectal mucosa. Right-sided diverticula; numerous 5-6 mm polyps in the descending and ascending segments.  THERAPEUTIC / DIAGNOSTIC MANEUVERS PERFORMED:  Multiple hot snare polypectomies performed. Multiple polyp ablations performed as well in  the descending and asending segments. The rectal polyp was hot snare removed.  COMPLICATIONS:    None  CECAL WITHDRAWAL TIME:  17 minutes  IMPRESSION:    Multiple colonic and rectal polyps  - treated as described above.  Right-sided diverticulosis  RECOMMENDATIONS:   Followup on pathology  ______________________________ R. Roetta Sessions, MD Caleen Essex  CC:  Karleen Hampshire, M.D.  n. eSIGNED:   R. Roetta Sessions at 02/25/2012 08:29 AM  Rexene Alberts, [phone]

## 2012-02-28 ENCOUNTER — Encounter (HOSPITAL_COMMUNITY): Payer: Self-pay | Admitting: Internal Medicine

## 2012-02-28 DIAGNOSIS — J309 Allergic rhinitis, unspecified: Secondary | ICD-10-CM | POA: Diagnosis not present

## 2012-03-07 ENCOUNTER — Encounter: Payer: Self-pay | Admitting: Internal Medicine

## 2012-03-07 DIAGNOSIS — J309 Allergic rhinitis, unspecified: Secondary | ICD-10-CM | POA: Diagnosis not present

## 2012-03-18 DIAGNOSIS — E291 Testicular hypofunction: Secondary | ICD-10-CM | POA: Diagnosis not present

## 2012-03-18 DIAGNOSIS — E785 Hyperlipidemia, unspecified: Secondary | ICD-10-CM | POA: Diagnosis not present

## 2012-03-18 DIAGNOSIS — I10 Essential (primary) hypertension: Secondary | ICD-10-CM | POA: Diagnosis not present

## 2012-03-18 DIAGNOSIS — E119 Type 2 diabetes mellitus without complications: Secondary | ICD-10-CM | POA: Diagnosis not present

## 2012-03-18 DIAGNOSIS — Z6828 Body mass index (BMI) 28.0-28.9, adult: Secondary | ICD-10-CM | POA: Diagnosis not present

## 2012-04-02 DIAGNOSIS — J309 Allergic rhinitis, unspecified: Secondary | ICD-10-CM | POA: Diagnosis not present

## 2012-04-15 DIAGNOSIS — E291 Testicular hypofunction: Secondary | ICD-10-CM | POA: Diagnosis not present

## 2012-04-22 DIAGNOSIS — J309 Allergic rhinitis, unspecified: Secondary | ICD-10-CM | POA: Diagnosis not present

## 2012-05-06 DIAGNOSIS — E785 Hyperlipidemia, unspecified: Secondary | ICD-10-CM | POA: Diagnosis not present

## 2012-05-06 DIAGNOSIS — Z6828 Body mass index (BMI) 28.0-28.9, adult: Secondary | ICD-10-CM | POA: Diagnosis not present

## 2012-05-06 DIAGNOSIS — R5381 Other malaise: Secondary | ICD-10-CM | POA: Diagnosis not present

## 2012-05-06 DIAGNOSIS — M109 Gout, unspecified: Secondary | ICD-10-CM | POA: Diagnosis not present

## 2012-05-06 DIAGNOSIS — E119 Type 2 diabetes mellitus without complications: Secondary | ICD-10-CM | POA: Diagnosis not present

## 2012-05-07 DIAGNOSIS — E1149 Type 2 diabetes mellitus with other diabetic neurological complication: Secondary | ICD-10-CM | POA: Diagnosis not present

## 2012-05-07 DIAGNOSIS — E119 Type 2 diabetes mellitus without complications: Secondary | ICD-10-CM | POA: Diagnosis not present

## 2012-05-26 DIAGNOSIS — E291 Testicular hypofunction: Secondary | ICD-10-CM | POA: Diagnosis not present

## 2012-05-26 DIAGNOSIS — J309 Allergic rhinitis, unspecified: Secondary | ICD-10-CM | POA: Diagnosis not present

## 2012-06-12 DIAGNOSIS — E291 Testicular hypofunction: Secondary | ICD-10-CM | POA: Diagnosis not present

## 2012-06-24 DIAGNOSIS — J309 Allergic rhinitis, unspecified: Secondary | ICD-10-CM | POA: Diagnosis not present

## 2012-07-01 DIAGNOSIS — E119 Type 2 diabetes mellitus without complications: Secondary | ICD-10-CM | POA: Diagnosis not present

## 2012-07-01 DIAGNOSIS — H353190 Nonexudative age-related macular degeneration, unspecified eye, stage unspecified: Secondary | ICD-10-CM | POA: Diagnosis not present

## 2012-07-03 DIAGNOSIS — H353190 Nonexudative age-related macular degeneration, unspecified eye, stage unspecified: Secondary | ICD-10-CM | POA: Diagnosis not present

## 2012-07-03 DIAGNOSIS — H2510 Age-related nuclear cataract, unspecified eye: Secondary | ICD-10-CM | POA: Diagnosis not present

## 2012-07-03 DIAGNOSIS — E119 Type 2 diabetes mellitus without complications: Secondary | ICD-10-CM | POA: Diagnosis not present

## 2012-07-07 DIAGNOSIS — E291 Testicular hypofunction: Secondary | ICD-10-CM | POA: Diagnosis not present

## 2012-07-16 DIAGNOSIS — E119 Type 2 diabetes mellitus without complications: Secondary | ICD-10-CM | POA: Diagnosis not present

## 2012-07-16 DIAGNOSIS — E1149 Type 2 diabetes mellitus with other diabetic neurological complication: Secondary | ICD-10-CM | POA: Diagnosis not present

## 2012-07-22 DIAGNOSIS — J309 Allergic rhinitis, unspecified: Secondary | ICD-10-CM | POA: Diagnosis not present

## 2012-08-06 DIAGNOSIS — E291 Testicular hypofunction: Secondary | ICD-10-CM | POA: Diagnosis not present

## 2012-08-14 DIAGNOSIS — J3089 Other allergic rhinitis: Secondary | ICD-10-CM | POA: Diagnosis not present

## 2012-08-15 DIAGNOSIS — I10 Essential (primary) hypertension: Secondary | ICD-10-CM | POA: Diagnosis not present

## 2012-08-15 DIAGNOSIS — E782 Mixed hyperlipidemia: Secondary | ICD-10-CM | POA: Diagnosis not present

## 2012-08-15 DIAGNOSIS — I2510 Atherosclerotic heart disease of native coronary artery without angina pectoris: Secondary | ICD-10-CM | POA: Diagnosis not present

## 2012-08-18 DIAGNOSIS — I10 Essential (primary) hypertension: Secondary | ICD-10-CM | POA: Diagnosis not present

## 2012-08-18 DIAGNOSIS — E785 Hyperlipidemia, unspecified: Secondary | ICD-10-CM | POA: Diagnosis not present

## 2012-08-18 DIAGNOSIS — E1129 Type 2 diabetes mellitus with other diabetic kidney complication: Secondary | ICD-10-CM | POA: Diagnosis not present

## 2012-08-18 DIAGNOSIS — I2510 Atherosclerotic heart disease of native coronary artery without angina pectoris: Secondary | ICD-10-CM | POA: Diagnosis not present

## 2012-08-18 DIAGNOSIS — M109 Gout, unspecified: Secondary | ICD-10-CM | POA: Diagnosis not present

## 2012-08-18 DIAGNOSIS — E1165 Type 2 diabetes mellitus with hyperglycemia: Secondary | ICD-10-CM | POA: Diagnosis not present

## 2012-08-18 DIAGNOSIS — Z713 Dietary counseling and surveillance: Secondary | ICD-10-CM | POA: Diagnosis not present

## 2012-08-18 DIAGNOSIS — Z7182 Exercise counseling: Secondary | ICD-10-CM | POA: Diagnosis not present

## 2012-08-18 DIAGNOSIS — Z0000 Encounter for general adult medical examination without abnormal findings: Secondary | ICD-10-CM | POA: Diagnosis not present

## 2012-08-22 DIAGNOSIS — Z23 Encounter for immunization: Secondary | ICD-10-CM | POA: Diagnosis not present

## 2012-09-19 DIAGNOSIS — E291 Testicular hypofunction: Secondary | ICD-10-CM | POA: Diagnosis not present

## 2012-09-24 DIAGNOSIS — E119 Type 2 diabetes mellitus without complications: Secondary | ICD-10-CM | POA: Diagnosis not present

## 2012-09-24 DIAGNOSIS — E1149 Type 2 diabetes mellitus with other diabetic neurological complication: Secondary | ICD-10-CM | POA: Diagnosis not present

## 2012-11-13 DIAGNOSIS — Z23 Encounter for immunization: Secondary | ICD-10-CM | POA: Diagnosis not present

## 2012-11-13 DIAGNOSIS — E1165 Type 2 diabetes mellitus with hyperglycemia: Secondary | ICD-10-CM | POA: Diagnosis not present

## 2012-11-13 DIAGNOSIS — I10 Essential (primary) hypertension: Secondary | ICD-10-CM | POA: Diagnosis not present

## 2012-11-13 DIAGNOSIS — Z6829 Body mass index (BMI) 29.0-29.9, adult: Secondary | ICD-10-CM | POA: Diagnosis not present

## 2012-11-13 DIAGNOSIS — E1129 Type 2 diabetes mellitus with other diabetic kidney complication: Secondary | ICD-10-CM | POA: Diagnosis not present

## 2012-11-13 DIAGNOSIS — I2510 Atherosclerotic heart disease of native coronary artery without angina pectoris: Secondary | ICD-10-CM | POA: Diagnosis not present

## 2012-11-13 DIAGNOSIS — S61409A Unspecified open wound of unspecified hand, initial encounter: Secondary | ICD-10-CM | POA: Diagnosis not present

## 2012-11-28 DIAGNOSIS — I2510 Atherosclerotic heart disease of native coronary artery without angina pectoris: Secondary | ICD-10-CM | POA: Diagnosis not present

## 2012-11-28 DIAGNOSIS — E1129 Type 2 diabetes mellitus with other diabetic kidney complication: Secondary | ICD-10-CM | POA: Diagnosis not present

## 2012-11-28 DIAGNOSIS — S61409A Unspecified open wound of unspecified hand, initial encounter: Secondary | ICD-10-CM | POA: Diagnosis not present

## 2012-12-02 DIAGNOSIS — E119 Type 2 diabetes mellitus without complications: Secondary | ICD-10-CM | POA: Diagnosis not present

## 2012-12-02 DIAGNOSIS — E1149 Type 2 diabetes mellitus with other diabetic neurological complication: Secondary | ICD-10-CM | POA: Diagnosis not present

## 2012-12-16 DIAGNOSIS — L219 Seborrheic dermatitis, unspecified: Secondary | ICD-10-CM | POA: Diagnosis not present

## 2012-12-16 DIAGNOSIS — D485 Neoplasm of uncertain behavior of skin: Secondary | ICD-10-CM | POA: Diagnosis not present

## 2012-12-16 DIAGNOSIS — L570 Actinic keratosis: Secondary | ICD-10-CM | POA: Diagnosis not present

## 2013-02-17 DIAGNOSIS — E1149 Type 2 diabetes mellitus with other diabetic neurological complication: Secondary | ICD-10-CM | POA: Diagnosis not present

## 2013-02-17 DIAGNOSIS — E119 Type 2 diabetes mellitus without complications: Secondary | ICD-10-CM | POA: Diagnosis not present

## 2013-03-11 DIAGNOSIS — E1165 Type 2 diabetes mellitus with hyperglycemia: Secondary | ICD-10-CM | POA: Diagnosis not present

## 2013-03-11 DIAGNOSIS — Z6830 Body mass index (BMI) 30.0-30.9, adult: Secondary | ICD-10-CM | POA: Diagnosis not present

## 2013-03-11 DIAGNOSIS — Z23 Encounter for immunization: Secondary | ICD-10-CM | POA: Diagnosis not present

## 2013-03-11 DIAGNOSIS — M109 Gout, unspecified: Secondary | ICD-10-CM | POA: Diagnosis not present

## 2013-04-09 DIAGNOSIS — Z23 Encounter for immunization: Secondary | ICD-10-CM | POA: Diagnosis not present

## 2013-04-21 DIAGNOSIS — E1149 Type 2 diabetes mellitus with other diabetic neurological complication: Secondary | ICD-10-CM | POA: Diagnosis not present

## 2013-04-21 DIAGNOSIS — E119 Type 2 diabetes mellitus without complications: Secondary | ICD-10-CM | POA: Diagnosis not present

## 2013-05-05 DIAGNOSIS — H2510 Age-related nuclear cataract, unspecified eye: Secondary | ICD-10-CM | POA: Diagnosis not present

## 2013-05-05 DIAGNOSIS — H353190 Nonexudative age-related macular degeneration, unspecified eye, stage unspecified: Secondary | ICD-10-CM | POA: Diagnosis not present

## 2013-05-05 DIAGNOSIS — E119 Type 2 diabetes mellitus without complications: Secondary | ICD-10-CM | POA: Diagnosis not present

## 2013-05-15 ENCOUNTER — Emergency Department (INDEPENDENT_AMBULATORY_CARE_PROVIDER_SITE_OTHER)
Admission: EM | Admit: 2013-05-15 | Discharge: 2013-05-15 | Disposition: A | Discharge status: Home / Self Care | Payer: Medicare Other | Source: Home / Self Care

## 2013-05-15 ENCOUNTER — Encounter (HOSPITAL_COMMUNITY): Payer: Self-pay | Admitting: Emergency Medicine

## 2013-05-15 DIAGNOSIS — L0291 Cutaneous abscess, unspecified: Secondary | ICD-10-CM

## 2013-05-15 DIAGNOSIS — R21 Rash and other nonspecific skin eruption: Secondary | ICD-10-CM

## 2013-05-15 DIAGNOSIS — L0390 Cellulitis, unspecified: Secondary | ICD-10-CM

## 2013-05-15 MED ORDER — DOXYCYCLINE HYCLATE 50 MG PO CAPS: 50.0000 mg | ORAL_CAPSULE | Freq: Two times a day (BID) | ORAL | Status: DC

## 2013-05-15 NOTE — ED Provider Notes (Signed)
History     CSN: [id number]  Arrival date & time 05/15/13  1120   First MD Initiated Contact with Patient 05/15/13 1203      Chief Complaint  Patient presents with  . Tick Removal    (Consider location/radiation/quality/duration/timing/severity/associated sxs/prior treatment) HPI This is a 79 year old male who noticed a tick on his left leg lower inner near the ankle aspect 2 days of. He was able to rub the take off however noticed yesterday that he had developed a rash around the area where the tick was present. The area is itchy and his foot was swollen yesterday. Past Medical History  Diagnosis Date  . Macular degeneration   . Diabetes mellitus   . Hypertension   . GERD (gastroesophageal reflux disease)   . Gout   . Arthritis   . Cancer     skin cancer of forehead    Past Surgical History  Procedure Laterality Date  . Coronary artery bypass graft  02-28-2011    4 vessels  . Pilonidal cyst excision    . Colonoscopy  02/25/2012    Procedure: COLONOSCOPY;  Surgeon: Corbin Ade, MD;  Location: [facility];  Service: Endoscopy;  Laterality: N/A;  7:30 AM    Family History  Problem Relation Age of Onset  . Anesthesia problems Neg Hx   . Hypotension Neg Hx   . Malignant hyperthermia Neg Hx   . Pseudochol deficiency Neg Hx     History  Substance Use Topics  . Smoking status: Former Smoker -- 0.50 packs/day for 25 years    Types: Cigarettes    Quit date: 02/25/1983  . Smokeless tobacco: Not on file  . Alcohol Use: Yes     Comment: occasional      Review of Systems  Constitutional: Negative.   HENT: Negative.   Eyes: Negative.   Respiratory: Negative.   Cardiovascular: Negative.   Gastrointestinal: Negative.   Genitourinary: Negative.   Musculoskeletal: Negative.   Skin: Positive for rash.  Neurological: Negative.   Hematological: Negative.   Psychiatric/Behavioral: Negative.     Allergies  Review of patient's allergies indicates no known  allergies.  Home Medications   Current Outpatient Rx  Name  Route  Sig  Dispense  Refill  . aspirin 325 MG tablet   Oral   Take 325 mg by mouth daily.         Marland Kitchen atorvastatin (LIPITOR) 80 MG tablet   Oral   Take 40 mg by mouth daily.          . beta carotene w/minerals (OCUVITE) tablet   Oral   Take 2 tablets by mouth daily.          . carvedilol (COREG) 12.5 MG tablet   Oral   Take 12.5 mg by mouth 2 (two) times daily with a meal.         . glimepiride (AMARYL) 4 MG tablet   Oral   Take 4 mg by mouth daily before breakfast.         . metFORMIN (GLUCOPHAGE) 500 MG tablet   Oral   Take 500 mg by mouth 2 (two) times daily with a meal.         . Multiple Vitamins-Iron (MULTIVITAMINS WITH IRON) TABS   Oral   Take 1 tablet by mouth daily.         . quinapril (ACCUPRIL) 20 MG tablet   Oral   Take 20 mg by mouth daily.          Marland Kitchen  ranitidine (ZANTAC) 150 MG tablet   Oral   Take 150 mg by mouth 2 (two) times daily.         Marland Kitchen allopurinol (ZYLOPRIM) 100 MG tablet   Oral   Take 100 mg by mouth daily.         Marland Kitchen doxycycline (VIBRAMYCIN) 50 MG capsule   Oral   Take 1 capsule (50 mg total) by mouth 2 (two) times daily.   14 capsule   0     BP 153/67  Pulse 60  Temp(Src) 99.7 F (37.6 C) (Oral)  Resp 18  SpO2 99%  Physical Exam  Constitutional: He is oriented to person, place, and time. He appears well-developed and well-nourished.  HENT:  Head: Normocephalic and atraumatic.  Eyes: Conjunctivae are normal. Pupils are equal, round, and reactive to light.  Neck: Normal range of motion. Neck supple.  Cardiovascular: Normal rate and regular rhythm.   Pulmonary/Chest: Effort normal and breath sounds normal.  Abdominal: Soft. Bowel sounds are normal.  Musculoskeletal: Normal range of motion.  Neurological: He is alert and oriented to person, place, and time.  Skin: Skin is warm and dry.  Circumferential erythema present on the left lower leg just  above the ankle extending about 3 inches vertically and 2 inches horizontally.  Psychiatric: He has a normal mood and affect. His behavior is normal.    ED Course  Procedures (including critical care time)  Labs Reviewed - No data to display No results found.   1. Rash   2. Cellulitis       MDM  I suspect this may be a cellulitis. We'll treat him with doxycycline which will also treat for [facility] spotted fever present. Of note patient does have a low-grade fever of 99.9. He is advised that if rash worsens he is to return to urgent care.        Calvert Cantor, MD 05/15/13 1214

## 2013-05-15 NOTE — ED Notes (Signed)
Pt is here for poss tick bite to left leg, above ankle ... Noticed it yest night Sxs include: swelling, redness, stiffness... Redness and swelling gradually getting worse Denies: fevers, pain He is alert and oriented w/no signs of acute distress.

## 2013-06-23 DIAGNOSIS — E669 Obesity, unspecified: Secondary | ICD-10-CM | POA: Diagnosis not present

## 2013-06-23 DIAGNOSIS — I2510 Atherosclerotic heart disease of native coronary artery without angina pectoris: Secondary | ICD-10-CM | POA: Diagnosis not present

## 2013-06-23 DIAGNOSIS — I10 Essential (primary) hypertension: Secondary | ICD-10-CM | POA: Diagnosis not present

## 2013-06-23 DIAGNOSIS — Z6829 Body mass index (BMI) 29.0-29.9, adult: Secondary | ICD-10-CM | POA: Diagnosis not present

## 2013-06-23 DIAGNOSIS — E1165 Type 2 diabetes mellitus with hyperglycemia: Secondary | ICD-10-CM | POA: Diagnosis not present

## 2013-06-25 ENCOUNTER — Encounter: Payer: Self-pay | Admitting: Cardiovascular Disease

## 2013-07-03 DIAGNOSIS — E119 Type 2 diabetes mellitus without complications: Secondary | ICD-10-CM | POA: Diagnosis not present

## 2013-07-03 DIAGNOSIS — E1149 Type 2 diabetes mellitus with other diabetic neurological complication: Secondary | ICD-10-CM | POA: Diagnosis not present

## 2013-07-08 DIAGNOSIS — E785 Hyperlipidemia, unspecified: Secondary | ICD-10-CM | POA: Diagnosis not present

## 2013-07-08 DIAGNOSIS — I10 Essential (primary) hypertension: Secondary | ICD-10-CM | POA: Diagnosis not present

## 2013-07-22 DIAGNOSIS — I2510 Atherosclerotic heart disease of native coronary artery without angina pectoris: Secondary | ICD-10-CM | POA: Diagnosis not present

## 2013-07-22 DIAGNOSIS — E785 Hyperlipidemia, unspecified: Secondary | ICD-10-CM | POA: Diagnosis not present

## 2013-07-22 DIAGNOSIS — I10 Essential (primary) hypertension: Secondary | ICD-10-CM | POA: Diagnosis not present

## 2013-07-23 ENCOUNTER — Encounter: Payer: Self-pay | Admitting: *Deleted

## 2013-07-24 ENCOUNTER — Ambulatory Visit (INDEPENDENT_AMBULATORY_CARE_PROVIDER_SITE_OTHER): Payer: Medicare Other | Admitting: Cardiovascular Disease

## 2013-07-24 ENCOUNTER — Encounter: Payer: Self-pay | Admitting: Cardiovascular Disease

## 2013-07-24 VITALS — BP 120/66 | HR 56 | Ht 72.0 in | Wt 196.9 lb

## 2013-07-24 DIAGNOSIS — I2510 Atherosclerotic heart disease of native coronary artery without angina pectoris: Secondary | ICD-10-CM | POA: Diagnosis not present

## 2013-07-24 DIAGNOSIS — I4891 Unspecified atrial fibrillation: Secondary | ICD-10-CM

## 2013-07-24 DIAGNOSIS — I10 Essential (primary) hypertension: Secondary | ICD-10-CM

## 2013-07-24 DIAGNOSIS — I519 Heart disease, unspecified: Secondary | ICD-10-CM

## 2013-07-24 DIAGNOSIS — E785 Hyperlipidemia, unspecified: Secondary | ICD-10-CM

## 2013-07-24 DIAGNOSIS — E119 Type 2 diabetes mellitus without complications: Secondary | ICD-10-CM | POA: Diagnosis not present

## 2013-07-24 NOTE — Patient Instructions (Addendum)
Your physician recommends that you schedule a follow-up appointment in: One Year.  

## 2013-07-31 ENCOUNTER — Encounter (HOSPITAL_COMMUNITY): Payer: Self-pay | Admitting: *Deleted

## 2013-07-31 ENCOUNTER — Inpatient Hospital Stay (HOSPITAL_COMMUNITY)
Admission: EM | Admit: 2013-07-31 | Discharge: 2013-08-05 | DRG: 440 | Disposition: A | Discharge status: Home / Self Care | Payer: Medicare Other | Attending: Internal Medicine | Admitting: Internal Medicine

## 2013-07-31 ENCOUNTER — Emergency Department (HOSPITAL_COMMUNITY): Payer: Medicare Other

## 2013-07-31 DIAGNOSIS — I10 Essential (primary) hypertension: Secondary | ICD-10-CM | POA: Diagnosis not present

## 2013-07-31 DIAGNOSIS — Z860100 Personal history of colon polyps, unspecified: Secondary | ICD-10-CM

## 2013-07-31 DIAGNOSIS — Z809 Family history of malignant neoplasm, unspecified: Secondary | ICD-10-CM

## 2013-07-31 DIAGNOSIS — E119 Type 2 diabetes mellitus without complications: Secondary | ICD-10-CM | POA: Diagnosis present

## 2013-07-31 DIAGNOSIS — H3530 Unspecified macular degeneration: Secondary | ICD-10-CM | POA: Diagnosis present

## 2013-07-31 DIAGNOSIS — R112 Nausea with vomiting, unspecified: Secondary | ICD-10-CM | POA: Diagnosis not present

## 2013-07-31 DIAGNOSIS — K8020 Calculus of gallbladder without cholecystitis without obstruction: Secondary | ICD-10-CM | POA: Diagnosis not present

## 2013-07-31 DIAGNOSIS — R799 Abnormal finding of blood chemistry, unspecified: Secondary | ICD-10-CM | POA: Diagnosis not present

## 2013-07-31 DIAGNOSIS — M109 Gout, unspecified: Secondary | ICD-10-CM | POA: Diagnosis present

## 2013-07-31 DIAGNOSIS — K869 Disease of pancreas, unspecified: Secondary | ICD-10-CM | POA: Diagnosis not present

## 2013-07-31 DIAGNOSIS — R110 Nausea: Secondary | ICD-10-CM | POA: Diagnosis not present

## 2013-07-31 DIAGNOSIS — M129 Arthropathy, unspecified: Secondary | ICD-10-CM | POA: Diagnosis present

## 2013-07-31 DIAGNOSIS — Z79899 Other long term (current) drug therapy: Secondary | ICD-10-CM | POA: Diagnosis not present

## 2013-07-31 DIAGNOSIS — I4510 Unspecified right bundle-branch block: Secondary | ICD-10-CM | POA: Diagnosis present

## 2013-07-31 DIAGNOSIS — I2510 Atherosclerotic heart disease of native coronary artery without angina pectoris: Secondary | ICD-10-CM | POA: Diagnosis present

## 2013-07-31 DIAGNOSIS — R109 Unspecified abdominal pain: Secondary | ICD-10-CM | POA: Diagnosis not present

## 2013-07-31 DIAGNOSIS — K859 Acute pancreatitis without necrosis or infection, unspecified: Secondary | ICD-10-CM | POA: Diagnosis not present

## 2013-07-31 DIAGNOSIS — Z87891 Personal history of nicotine dependence: Secondary | ICD-10-CM

## 2013-07-31 DIAGNOSIS — R748 Abnormal levels of other serum enzymes: Secondary | ICD-10-CM | POA: Diagnosis present

## 2013-07-31 DIAGNOSIS — Z8249 Family history of ischemic heart disease and other diseases of the circulatory system: Secondary | ICD-10-CM

## 2013-07-31 DIAGNOSIS — Z85828 Personal history of other malignant neoplasm of skin: Secondary | ICD-10-CM | POA: Diagnosis not present

## 2013-07-31 DIAGNOSIS — K219 Gastro-esophageal reflux disease without esophagitis: Secondary | ICD-10-CM | POA: Diagnosis present

## 2013-07-31 DIAGNOSIS — E875 Hyperkalemia: Secondary | ICD-10-CM | POA: Diagnosis present

## 2013-07-31 DIAGNOSIS — E785 Hyperlipidemia, unspecified: Secondary | ICD-10-CM

## 2013-07-31 DIAGNOSIS — Z823 Family history of stroke: Secondary | ICD-10-CM | POA: Diagnosis not present

## 2013-07-31 DIAGNOSIS — R6889 Other general symptoms and signs: Secondary | ICD-10-CM | POA: Diagnosis not present

## 2013-07-31 DIAGNOSIS — E1159 Type 2 diabetes mellitus with other circulatory complications: Secondary | ICD-10-CM | POA: Diagnosis present

## 2013-07-31 DIAGNOSIS — R1011 Right upper quadrant pain: Secondary | ICD-10-CM | POA: Diagnosis not present

## 2013-07-31 DIAGNOSIS — N289 Disorder of kidney and ureter, unspecified: Secondary | ICD-10-CM | POA: Diagnosis not present

## 2013-07-31 DIAGNOSIS — Z951 Presence of aortocoronary bypass graft: Secondary | ICD-10-CM | POA: Diagnosis not present

## 2013-07-31 LAB — COMPREHENSIVE METABOLIC PANEL
ALT: 314 U/L — ABNORMAL HIGH (ref 0–53)
BUN: 27 mg/dL — ABNORMAL HIGH (ref 6–23)
Calcium: 9.2 mg/dL (ref 8.4–10.5)
Creatinine, Ser: 1.32 mg/dL (ref 0.50–1.35)
GFR calc Af Amer: 58 mL/min — ABNORMAL LOW (ref >90)
GFR calc non Af Amer: 50 mL/min — ABNORMAL LOW (ref >90)
Glucose, Bld: 255 mg/dL — ABNORMAL HIGH (ref 70–99)
Sodium: 134 mEq/L — ABNORMAL LOW (ref 135–145)
Total Protein: 6.8 g/dL (ref 6.0–8.3)

## 2013-07-31 LAB — LIPASE, BLOOD: Lipase: 211 U/L — ABNORMAL HIGH (ref 11–59)

## 2013-07-31 LAB — CBC WITH DIFFERENTIAL/PLATELET
Eosinophils Absolute: 0 10*3/uL (ref 0.0–0.7)
Eosinophils Relative: 0 % (ref 0–5)
HCT: 33.9 % — ABNORMAL LOW (ref 39.0–52.0)
Lymphs Abs: 0.4 10*3/uL — ABNORMAL LOW (ref 0.7–4.0)
MCH: 31 pg (ref 26.0–34.0)
MCV: 90.6 fL (ref 78.0–100.0)
Monocytes Absolute: 0.4 10*3/uL (ref 0.1–1.0)
Platelets: 189 10*3/uL (ref 150–400)
RBC: 3.74 MIL/uL — ABNORMAL LOW (ref 4.22–5.81)
RDW: 12.9 % (ref 11.5–15.5)

## 2013-07-31 LAB — URINALYSIS, ROUTINE W REFLEX MICROSCOPIC
Leukocytes, UA: NEGATIVE
Nitrite: NEGATIVE
Protein, ur: NEGATIVE mg/dL
Specific Gravity, Urine: 1.01 (ref 1.005–1.030)
Urobilinogen, UA: 1 mg/dL (ref 0.0–1.0)

## 2013-07-31 MED ORDER — ACETAMINOPHEN 325 MG PO TABS
650.0000 mg | ORAL_TABLET | Freq: Once | ORAL | Status: AC
  Administered 2013-07-31: 650 mg via ORAL

## 2013-07-31 MED ORDER — LISINOPRIL 5 MG PO TABS
5.0000 mg | ORAL_TABLET | Freq: Every day | ORAL | Status: DC
  Administered 2013-07-31 – 2013-08-05 (×6): 5 mg via ORAL
  Filled 2013-07-31 (×6): qty 1

## 2013-07-31 MED ORDER — ATORVASTATIN CALCIUM 40 MG PO TABS
40.0000 mg | ORAL_TABLET | Freq: Every day | ORAL | Status: DC
  Administered 2013-07-31 – 2013-08-05 (×6): 40 mg via ORAL
  Filled 2013-07-31 (×6): qty 1

## 2013-07-31 MED ORDER — SODIUM CHLORIDE 0.9 % IV SOLN
3.0000 g | Freq: Once | INTRAVENOUS | Status: AC
  Administered 2013-07-31: 3 g via INTRAVENOUS
  Filled 2013-07-31: qty 3

## 2013-07-31 MED ORDER — OXYCODONE HCL 5 MG PO TABS
5.0000 mg | ORAL_TABLET | Freq: Four times a day (QID) | ORAL | Status: DC | PRN
  Administered 2013-08-05: 5 mg via ORAL
  Filled 2013-07-31: qty 1

## 2013-07-31 MED ORDER — FAMOTIDINE 20 MG PO TABS
20.0000 mg | ORAL_TABLET | Freq: Two times a day (BID) | ORAL | Status: DC
  Administered 2013-07-31 – 2013-08-05 (×10): 20 mg via ORAL
  Filled 2013-07-31 (×10): qty 1

## 2013-07-31 MED ORDER — ONDANSETRON HCL 4 MG/2ML IJ SOLN
4.0000 mg | Freq: Once | INTRAMUSCULAR | Status: AC
  Administered 2013-07-31: 4 mg via INTRAVENOUS
  Filled 2013-07-31: qty 2

## 2013-07-31 MED ORDER — SODIUM CHLORIDE 0.9 % IJ SOLN
3.0000 mL | Freq: Two times a day (BID) | INTRAMUSCULAR | Status: DC
  Administered 2013-07-31 – 2013-08-04 (×3): 3 mL via INTRAVENOUS

## 2013-07-31 MED ORDER — ASPIRIN 325 MG PO TABS
325.0000 mg | ORAL_TABLET | Freq: Every day | ORAL | Status: DC
  Administered 2013-07-31 – 2013-08-05 (×6): 325 mg via ORAL
  Filled 2013-07-31 (×6): qty 1

## 2013-07-31 MED ORDER — SODIUM CHLORIDE 0.9 % IV SOLN
INTRAVENOUS | Status: DC
  Administered 2013-07-31 – 2013-08-05 (×6): via INTRAVENOUS

## 2013-07-31 MED ORDER — INSULIN ASPART 100 UNIT/ML ~~LOC~~ SOLN
0.0000 [IU] | Freq: Three times a day (TID) | SUBCUTANEOUS | Status: DC
Start: 2013-08-01 — End: 2013-08-05
  Administered 2013-08-01: 1 [IU] via SUBCUTANEOUS
  Administered 2013-08-01: 3 [IU] via SUBCUTANEOUS
  Administered 2013-08-01: 2 [IU] via SUBCUTANEOUS
  Administered 2013-08-02: 1 [IU] via SUBCUTANEOUS
  Administered 2013-08-02: 5 [IU] via SUBCUTANEOUS
  Administered 2013-08-02: 2 [IU] via SUBCUTANEOUS
  Administered 2013-08-03 (×2): 5 [IU] via SUBCUTANEOUS
  Administered 2013-08-03: 1 [IU] via SUBCUTANEOUS
  Administered 2013-08-04: 2 [IU] via SUBCUTANEOUS
  Administered 2013-08-04: 1 [IU] via SUBCUTANEOUS
  Administered 2013-08-04: 7 [IU] via SUBCUTANEOUS
  Administered 2013-08-05: 2 [IU] via SUBCUTANEOUS
  Administered 2013-08-05: 5 [IU] via SUBCUTANEOUS

## 2013-07-31 MED ORDER — TAB-A-VITE/IRON PO TABS: 1.0000 | ORAL_TABLET | Freq: Every day | ORAL | Status: DC

## 2013-07-31 MED ORDER — ACETAMINOPHEN 325 MG PO TABS
ORAL_TABLET | ORAL | Status: AC
  Administered 2013-07-31: 650 mg via ORAL
  Filled 2013-07-31: qty 2

## 2013-07-31 MED ORDER — SODIUM CHLORIDE 0.9 % IV SOLN
3.0000 g | Freq: Three times a day (TID) | INTRAVENOUS | Status: DC
  Administered 2013-08-01 – 2013-08-05 (×14): 3 g via INTRAVENOUS
  Filled 2013-07-31 (×17): qty 3

## 2013-07-31 MED ORDER — ONDANSETRON HCL 4 MG/2ML IJ SOLN: 4.0000 mg | Freq: Three times a day (TID) | INTRAMUSCULAR | Status: AC | PRN

## 2013-07-31 MED ORDER — CARVEDILOL 12.5 MG PO TABS
12.5000 mg | ORAL_TABLET | Freq: Two times a day (BID) | ORAL | Status: DC
  Administered 2013-08-01 – 2013-08-05 (×9): 12.5 mg via ORAL
  Filled 2013-07-31 (×9): qty 1

## 2013-07-31 MED ORDER — SODIUM POLYSTYRENE SULFONATE 15 GM/60ML PO SUSP: 15.0000 g | Freq: Once | ORAL | Status: DC

## 2013-07-31 MED ORDER — ONDANSETRON HCL 4 MG PO TABS: 4.0000 mg | ORAL_TABLET | Freq: Four times a day (QID) | ORAL | Status: DC | PRN

## 2013-07-31 MED ORDER — QUINAPRIL HCL 5 MG PO TABS: 5.0000 mg | ORAL_TABLET | Freq: Every day | ORAL | Status: DC

## 2013-07-31 MED ORDER — ONDANSETRON HCL 4 MG/2ML IJ SOLN: 4.0000 mg | Freq: Four times a day (QID) | INTRAMUSCULAR | Status: DC | PRN

## 2013-07-31 MED ORDER — HEPARIN SODIUM (PORCINE) 5000 UNIT/ML IJ SOLN
5000.0000 [IU] | Freq: Three times a day (TID) | INTRAMUSCULAR | Status: DC
  Administered 2013-07-31 – 2013-08-05 (×14): 5000 [IU] via SUBCUTANEOUS
  Filled 2013-07-31 (×14): qty 1

## 2013-07-31 MED ORDER — ADULT MULTIVITAMIN W/MINERALS CH
1.0000 | ORAL_TABLET | Freq: Every day | ORAL | Status: DC
  Administered 2013-07-31 – 2013-08-05 (×6): 1 via ORAL
  Filled 2013-07-31 (×6): qty 1

## 2013-07-31 MED ORDER — SODIUM CHLORIDE 0.9 % IV BOLUS (SEPSIS)
1000.0000 mL | Freq: Once | INTRAVENOUS | Status: AC
  Administered 2013-07-31: 1000 mL via INTRAVENOUS

## 2013-07-31 NOTE — ED Provider Notes (Signed)
CSN: [id number]     Arrival date & time 07/31/13  0913 History  This chart was scribed for Toy Baker, MD by Karle Plumber, ED Scribe. This patient was seen in room APA12/APA12 and the patient's care was started at 9:30 AM . Chief Complaint  Patient presents with  . Altered Mental Status   The history is provided by the patient. No language interpreter was used.   HPI Comments:  Grant [name] is a 79 y.o. male who presents to the Emergency Department complaining of altered mental status onset this morning and chills Pt's wife is in the room and states that pt woke up confused this morning. Pt also complains of constant nausea onset last night and approximately four episodes of emesis today. He attributes these symptoms to a salad and a medium-well steak that he ate last night. He also reports having chills this morning, but states that his house was cold. Pt denies diarrhea, dysuria, cough, SOB, body aches, chest pain, abdominal pain, sore throat, ear pain or any other symptoms. Pt is unsure if he has had a fever. ED temperature was taken at 99.9 F on arrival, and retaken rectally at 103 F.  PCP- Dr. Karleen Hampshire   Past Medical History  Diagnosis Date  . Macular degeneration   . Diabetes mellitus   . Hypertension   . GERD (gastroesophageal reflux disease)   . Gout   . Arthritis   . Cancer     skin cancer of forehead  . Hyperlipidemia   . Erectile dysfunction   . RBBB   . CAD (coronary artery disease)    Past Surgical History  Procedure Laterality Date  . Coronary artery bypass graft  02-28-2011    LIMA to LAD,SVG to acute marginal branch of RCA & sequential SVG to OM1 & OM2.  . Pilonidal cyst excision    . Colonoscopy  02/25/2012    Procedure: COLONOSCOPY;  Surgeon: Corbin Ade, MD;  Location: [facility];  Service: Endoscopy;  Laterality: N/A;  7:30 AM  . US echocardiography  11/09/2008    mildly impaired diastolic dysfunction - Grade I  . Nm myocar perf wall  motion  02/15/2011    High Risk  . Cardiac catheterization  02/19/2011    3 vessel CAD   Family History  Problem Relation Age of Onset  . Anesthesia problems Neg Hx   . Hypotension Neg Hx   . Malignant hyperthermia Neg Hx   . Pseudochol deficiency Neg Hx   . Stroke Father   . Heart failure Brother   . Cancer Sister   . Hypertension Sister   . Heart failure Sister    History  Substance Use Topics  . Smoking status: Former Smoker -- 0.50 packs/day for 25 years    Types: Cigarettes    Quit date: 02/25/1983  . Smokeless tobacco: Not on file  . Alcohol Use: Yes     Comment: occasional    Review of Systems  Constitutional: Positive for chills.  HENT: Negative for ear pain and sore throat.   Respiratory: Negative for cough and shortness of breath.   Cardiovascular: Negative for chest pain.  Gastrointestinal: Positive for nausea. Negative for abdominal pain and diarrhea.  Genitourinary: Negative for dysuria.  Musculoskeletal: Negative for myalgias.  All other systems reviewed and are negative.    Allergies  Amiodarone  Home Medications   Current Outpatient Rx  Name  Route  Sig  Dispense  Refill  . allopurinol (ZYLOPRIM)  100 MG tablet   Oral   Take 100 mg by mouth daily.         Marland Kitchen aspirin 325 MG tablet   Oral   Take 325 mg by mouth daily.         Marland Kitchen atorvastatin (LIPITOR) 80 MG tablet   Oral   Take 40 mg by mouth daily.          . beta carotene w/minerals (OCUVITE) tablet   Oral   Take 2 tablets by mouth daily.          . carvedilol (COREG) 12.5 MG tablet   Oral   Take 12.5 mg by mouth 2 (two) times daily with a meal.         . glimepiride (AMARYL) 4 MG tablet   Oral   Take 4 mg by mouth daily before breakfast.         . metFORMIN (GLUCOPHAGE) 500 MG tablet   Oral   Take 500 mg by mouth 2 (two) times daily with a meal.         . Multiple Vitamins-Iron (MULTIVITAMINS WITH IRON) TABS   Oral   Take 1 tablet by mouth daily.         .  quinapril (ACCUPRIL) 20 MG tablet   Oral   Take 20 mg by mouth daily.          . ranitidine (ZANTAC) 150 MG tablet   Oral   Take 150 mg by mouth 2 (two) times daily.          Triage Vitals: BP 137/60  Pulse 90  Temp(Src) 99.9 F (37.7 C) (Oral)  Resp 16  Ht 6' (1.829 m)  Wt 192 lb (87.091 kg)  BMI 26.03 kg/m2  SpO2 96%  Physical Exam  Nursing note and vitals reviewed. Constitutional: He is oriented to person, place, and time. He appears well-developed and well-nourished.  HENT:  Head: Normocephalic and atraumatic.  Eyes: Conjunctivae are normal. Pupils are equal, round, and reactive to light.  Neck: Normal range of motion.  Cardiovascular: Normal rate, regular rhythm and normal heart sounds.   Pulmonary/Chest: Effort normal and breath sounds normal.  Abdominal: Soft. Bowel sounds are normal. There is tenderness in the right upper quadrant. There is guarding. There is no rebound.  Musculoskeletal: Normal range of motion.  Neurological: He is alert and oriented to person, place, and time.  Skin: Skin is warm and dry.   ED Course  Procedures (including critical care time)  DIAGNOSTIC STUDIES:  OXYGEN SATURATION 96% on RA, normal by my interpretation.   COORDINATION OF CARE: 9:40 AM-Patient advised of plan for treatment to obtain labs and a rectal temperature and patient agrees. Pt also agrees with plan to receive medications in the ED.  Medications  0.9 %  sodium chloride infusion ( Intravenous New Bag/Given 07/31/13 0942)  sodium chloride 0.9 % bolus 1,000 mL (0 mLs Intravenous Stopped 07/31/13 1040)  ondansetron (ZOFRAN) injection 4 mg (4 mg Intravenous Given 07/31/13 0950)  acetaminophen (TYLENOL) tablet 650 mg (650 mg Oral Given 07/31/13 0954)    Labs Review Labs Reviewed  CBC WITH DIFFERENTIAL - Abnormal; Notable for the following:    WBC 11.6 (*)    RBC 3.74 (*)    Hemoglobin 11.6 (*)    HCT 33.9 (*)    Neutrophils Relative % 93 (*)    Neutro Abs 10.8 (*)     Lymphocytes Relative 4 (*)    Lymphs Abs 0.4 (*)  All other components within normal limits  COMPREHENSIVE METABOLIC PANEL - Abnormal; Notable for the following:    Sodium 134 (*)    Potassium 5.2 (*)    Glucose, Bld 255 (*)    BUN 27 (*)    AST 298 (*)    ALT 314 (*)    Alkaline Phosphatase 574 (*)    Total Bilirubin 2.8 (*)    GFR calc non Af Amer 50 (*)    GFR calc Af Amer 58 (*)    All other components within normal limits  LIPASE, BLOOD - Abnormal; Notable for the following:    Lipase 211 (*)    All other components within normal limits  URINALYSIS, ROUTINE W REFLEX MICROSCOPIC - Abnormal; Notable for the following:    Glucose, UA 100 (*)    Bilirubin Urine SMALL (*)    Ketones, ur TRACE (*)    All other components within normal limits    Imaging Review US Abdomen Complete  07/31/2013   *RADIOLOGY REPORT*  Clinical Data:  Abdominal pain.  ABDOMINAL ULTRASOUND COMPLETE  Comparison:  None.  Findings:  Gallbladder:  There appears to be a combination of gallstones and sludge within the gallbladder lumen.  Minimal gallbladder wall thickening is noted.  No pericholecystic fluid is noted.  No sonographic Murphy's sign is noted.  Common Bile Duct:  Measures 5.7 mm which is within normal limits in caliber.  Liver: No focal mass lesion identified.  Within normal limits in parenchymal echogenicity.  IVC:  Appears normal.  Pancreas:  Pancreatic head appears normal.  The body and tail are not well visualized due to overlying bowel gas.  Spleen:  Within normal limits in size and echotexture.  Right kidney:  Measures 10.7 cm in length. 12 mm hypoechoic area is seen in the cortex of the upper pole of the right kidney. Normal in size and parenchymal echogenicity.  No evidence of mass or hydronephrosis.  Left kidney:  Measures 12.5 cm in length. Normal in size and parenchymal echogenicity.  No evidence of mass or hydronephrosis.  Abdominal Aorta:  Atheromatous disease is seen involving the  visualized abdominal aorta.  IMPRESSION: Probable combination of gallstones and sludge seen in gallbladder lumen with minimal gallbladder wall thickening.  No pericholecystic fluid or sonographic Murphy's sign is noted.  12 mm hypoechoic area is seen in upper pole of right kidney; this may simply represent a cyst, but it is not diagnostic by ultrasound criteria. Further evaluation with CT or MRI scan with contrast is recommended to rule out mass.   Original Report Authenticated By: Lupita Raider.,  M.D.    MDM  No diagnosis found.    I personally performed the services described in this documentation, which was scribed in my presence. The recorded information has been reviewed and is accurate.    4:11 PM Patient's blood work and ultrasound reviewed. Concern for cholangitis versus gallstone pancreatitis. Radiology recommended a CAT scan which is been performed. His fever has been treated with Tylenol he feels better. This was done before the patient's transaminases came back. Patient has CT scan pending at this time and this will be followed up by Dr. Hyacinth Meeker. Patient will be admitted to the hospital pending this. He also does not pancreatitis but his pain has been controlled. His abdominal exam at this time is none surgical  Toy Baker, MD 07/31/13 [phone]

## 2013-07-31 NOTE — ED Provider Notes (Addendum)
Pt reevaluated - has mild RUQ paina nd mild tympanitic sounds to percussion - labs show transaminitis - elevated lipase and alk phos - CT without acute RUQ findings - will d/w hospitalist given pt's fever and nausea and elevated LFT's as possible source.  nasuea has now resolved.  Pt is alert and oriented and answering questions appropriately.  D/w Dr. Lafe Garin - will admit - abx given.  Vida Roller, MD 07/31/13 1707  Vida Roller, MD 07/31/13 [phone]

## 2013-07-31 NOTE — H&P (Signed)
[facility] History and Physical  Grant [name] WUJ:[id number] DOB: 1934/05/14 DOA: 07/31/2013  Referring physician: Dr. Hyacinth Meeker PCP: Grant Ruths, MD  Specialists: None  Chief Complaint: Nausea and vomiting started this morning  HPI: Grant [name] is a 79 y.o. male has a past medical history significant for coronary artery disease status post CABG, hypertension, diabetes controlled with oral medications, hyperlipidemia, presents to the emergency department with a chief complaint of nausea and vomiting that started this morning. Patient attributes his symptoms to a state that he ate last night. He denies frank fevers, however he endorses chills. He is unable to take anything by mouth. He denies any chest pain, breathing difficulties, cough. He endorses abdominal discomfort, not really pain. He denies diarrhea at home, but after the contrast for the CT scan he had one loose bowel movement. No sick contacts. He denies any lightheadedness or dizziness. In the emergency room, patient was found to have significant elevations of her liver enzymes, fever up to 103 and a lipase elevation to 211. Patient underwent a CT of the abdomen and pelvis that showed no biliary ductal dilatation however shows some gallstones the largest measuring 4 mm. Right upper quadrant ultrasound was without pericholecystic fluid. His wife endorse that he was a one point told in the past that he has been diagnosed with gallstones.  Review of Systems: As per history of present illness, otherwise negative  Past Medical History  Diagnosis Date  . Macular degeneration   . Diabetes mellitus   . Hypertension   . GERD (gastroesophageal reflux disease)   . Gout   . Arthritis   . Cancer     skin cancer of forehead  . Hyperlipidemia   . Erectile dysfunction   . RBBB   . CAD (coronary artery disease)    Past Surgical History  Procedure Laterality Date  . Coronary artery bypass graft  02-28-2011    LIMA to LAD,SVG  to acute marginal branch of RCA & sequential SVG to OM1 & OM2.  . Pilonidal cyst excision    . Colonoscopy  02/25/2012    Procedure: COLONOSCOPY;  Surgeon: Corbin Ade, MD;  Location: [facility];  Service: Endoscopy;  Laterality: N/A;  7:30 AM  . US echocardiography  11/09/2008    mildly impaired diastolic dysfunction - Grade I  . Nm myocar perf wall motion  02/15/2011    High Risk  . Cardiac catheterization  02/19/2011    3 vessel CAD   Social History:  reports that he quit smoking about 30 years ago. His smoking use included Cigarettes. He has a 12.5 pack-year smoking history. He does not have any smokeless tobacco history on file. He reports that  drinks alcohol. He reports that he does not use illicit drugs.  Allergies  Allergen Reactions  . Amiodarone Nausea Only    Family History  Problem Relation Age of Onset  . Anesthesia problems Neg Hx   . Hypotension Neg Hx   . Malignant hyperthermia Neg Hx   . Pseudochol deficiency Neg Hx   . Stroke Father   . Heart failure Brother   . Cancer Sister   . Hypertension Sister   . Heart failure Sister    Prior to Admission medications   Medication Sig Start Date End Date Taking? Authorizing Provider  allopurinol (ZYLOPRIM) 100 MG tablet Take 100 mg by mouth daily.   Yes Historical Provider, MD  aspirin 325 MG tablet Take 325 mg by mouth daily.   Yes  Historical Provider, MD  atorvastatin (LIPITOR) 40 MG tablet Take 40 mg by mouth daily.   Yes Historical Provider, MD  carvedilol (COREG) 12.5 MG tablet Take 12.5 mg by mouth 2 (two) times daily with a meal.   Yes Historical Provider, MD  metFORMIN (GLUCOPHAGE) 500 MG tablet Take 1,000 mg by mouth 2 (two) times daily with a meal.    Yes Historical Provider, MD  Multiple Vitamins-Iron (MULTIVITAMINS WITH IRON) TABS Take 1 tablet by mouth daily.   Yes Historical Provider, MD  Multiple Vitamins-Minerals (PRESERVISION AREDS PO) Take 1 capsule by mouth 2 (two) times daily.   Yes Historical  Provider, MD  naproxen sodium (ANAPROX) 220 MG tablet Take 220 mg by mouth daily as needed (headache).   Yes Historical Provider, MD  quinapril (ACCUPRIL) 20 MG tablet Take 20 mg by mouth daily.    Yes Historical Provider, MD  ranitidine (ZANTAC) 150 MG tablet Take 150 mg by mouth 2 (two) times daily.   Yes Historical Provider, MD   Physical Exam: Filed Vitals:   07/31/13 1300 07/31/13 1400 07/31/13 1537 07/31/13 1710  BP: 120/64 150/65 131/48 128/55  Pulse: 85 79 70 66  Temp:   98.8 F (37.1 C) 98.3 F (36.8 C)  TempSrc:   Oral Rectal  Resp: 16 17 20 16   Height:      Weight:      SpO2: 96% 98% 97% 99%     General:  No apparent distress, pleasant Caucasian male  Eyes: PERRL, EOMI, mild scleral icterus  ENT: moist oropharynx  Neck: supple, no JVD  Cardiovascular: regular rate without MRG; 2+ peripheral pulses  Respiratory: CTA biL, good air movement without wheezing, rhonchi or crackled  Abdomen: soft, non tender to palpation, positive bowel sounds, no guarding, no rebound  Skin: no rashes  Musculoskeletal: no peripheral edema  Psychiatric: normal mood and affect  Neurologic: CN 2-12 grossly intact, MS 5/5 in all 4  Labs on Admission:  Basic Metabolic Panel:  Recent Labs Lab 07/31/13 1003  NA 134*  K 5.2*  CL 97  CO2 27  GLUCOSE 255*  BUN 27*  CREATININE 1.32  CALCIUM 9.2   Liver Function Tests:  Recent Labs Lab 07/31/13 1003  AST 298*  ALT 314*  ALKPHOS 574*  BILITOT 2.8*  PROT 6.8  ALBUMIN 3.5    Recent Labs Lab 07/31/13 1003  LIPASE 211*   CBC:  Recent Labs Lab 07/31/13 1003  WBC 11.6*  NEUTROABS 10.8*  HGB 11.6*  HCT 33.9*  MCV 90.6  PLT 189   Radiological Exams on Admission: Ct Abdomen Pelvis Wo Contrast  07/31/2013   *RADIOLOGY REPORT*  Clinical Data: Abdominal pain, history of diabetes  CT ABDOMEN AND PELVIS WITHOUT CONTRAST  Technique:  Multidetector CT imaging of the abdomen and pelvis was performed following the  standard protocol without intravenous contrast.  Comparison: None.  Findings: Sagittal images of the spine shows diffuse osteopenia. Disc space flattening at L4-L5 and L5 S1 level.  Disc space flattening with vacuum disc phenomenon at T12-L1 level.  Lung bases are unremarkable.  Small hiatal hernia.  Mild thickening of distal esophageal wall probable from gastroesophageal reflux disease.  Atherosclerotic calcifications of the abdominal aorta and the iliac arteries.  Unenhanced liver shows no biliary ductal dilatation.  Small calcified gallstones are noted in gallbladder fundal region the largest measures 4 mm.  Unenhanced pancreas, spleen and adrenal glands are unremarkable. Unenhanced kidneys shows cortical thinning probable due to atrophy. There is irregular contour  of the left kidney probable due to cortical scarring.  No nephrolithiasis.  No hydronephrosis or hydroureter.  Bilateral no calcified ureteral calculi are noted.  No aortic aneurysm.  Oral contrast material was given to the patient.  No small bowel obstruction.  There is no pericecal inflammation.  Normal appendix is clearly visualized axial image 57.  Mild distended urinary bladder.  Bilateral distal ureter is unremarkable.  No distal colonic obstruction.  Bilateral inguinal scrotal canal hernia containing fat is noted without evidence of acute complication. Prostate gland calcifications are noted.  The seminal vesicles are unremarkable.  IMPRESSION:  1.  Small hiatal hernia.  Mild thickening of distal esophageal wall probable from gastroesophageal reflux. 2.  Bilateral mild renal cortical thinning probable due to atrophy. Probable cortical scarring left kidney.  No nephrolithiasis.  No hydronephrosis or hydroureter.  No calcified ureteral calculi. 3.  No small bowel or colonic obstruction. 4.  No pericecal inflammation.  Normal appendix is clearly visualized. 5.  Mild distended urinary bladder.  No calcified calculi are noted within bladder. 6.   Bilateral inguinal scrotal canal hernia containing fat without evidence of acute complication.   Original Report Authenticated By: Natasha Mead, M.D.   US Abdomen Complete  07/31/2013   *RADIOLOGY REPORT*  Clinical Data:  Abdominal pain.  ABDOMINAL ULTRASOUND COMPLETE  Comparison:  None.  Findings:  Gallbladder:  There appears to be a combination of gallstones and sludge within the gallbladder lumen.  Minimal gallbladder wall thickening is noted.  No pericholecystic fluid is noted.  No sonographic Murphy's sign is noted.  Common Bile Duct:  Measures 5.7 mm which is within normal limits in caliber.  Liver: No focal mass lesion identified.  Within normal limits in parenchymal echogenicity.  IVC:  Appears normal.  Pancreas:  Pancreatic head appears normal.  The body and tail are not well visualized due to overlying bowel gas.  Spleen:  Within normal limits in size and echotexture.  Right kidney:  Measures 10.7 cm in length. 12 mm hypoechoic area is seen in the cortex of the upper pole of the right kidney. Normal in size and parenchymal echogenicity.  No evidence of mass or hydronephrosis.  Left kidney:  Measures 12.5 cm in length. Normal in size and parenchymal echogenicity.  No evidence of mass or hydronephrosis.  Abdominal Aorta:  Atheromatous disease is seen involving the visualized abdominal aorta.  IMPRESSION: Probable combination of gallstones and sludge seen in gallbladder lumen with minimal gallbladder wall thickening.  No pericholecystic fluid or sonographic Murphy's sign is noted.  12 mm hypoechoic area is seen in upper pole of right kidney; this may simply represent a cyst, but it is not diagnostic by ultrasound criteria. Further evaluation with CT or MRI scan with contrast is recommended to rule out mass.   Original Report Authenticated By: Lupita Raider.,  M.D.   EKG: Independently reviewed.  Assessment/Plan Active Problems:   CAD (coronary artery disease)   Hypertension   Pancreatitis    Hyperkalemia   Diabetes mellitus, type 2   Elevated liver enzymes   Acute pancreatitis - mild lipase elevation and some evidence of gallstones point to the fact that he may have passed a stone which could mild pancreatic inflammation. Nausea and vomiting - likely secondary to acute pancreatitis. Supportive treatment. Concern for cholangitis given fever - Unasyn was started in the ED, would continue that for now but his fever might have been related to his pancreatitis. Monitor.  Elevated liver enzymes - cholangitis vs stone  migration. Mixed cholestatic and hepatocellular injury. Monitor.  HTN - continue home meds DM 2 - SSI CAD - continue home meds DVT prophylaxis - heparin s.q.  Code Status: Full  Family Communication: wife bedside  Disposition Plan: inpatient  Time spent: 59  Costin M. Elvera Lennox, MD [facility] Pager [phone]  If 7PM-7AM, please contact night-coverage [email or web address] Password [facility] 07/31/2013, 6:23 PM

## 2013-07-31 NOTE — ED Notes (Signed)
Pt alert and oriented x 4 at this time.  nad noted.

## 2013-07-31 NOTE — ED Notes (Signed)
Pt alert to self and place only.  Per EMS - wife reports pt woke up confused this morning.  Last seen normal last night before bedtime, unknown time.  CBG en route 266.  Denies pain.

## 2013-08-01 DIAGNOSIS — K869 Disease of pancreas, unspecified: Secondary | ICD-10-CM

## 2013-08-01 DIAGNOSIS — R112 Nausea with vomiting, unspecified: Secondary | ICD-10-CM

## 2013-08-01 DIAGNOSIS — R799 Abnormal finding of blood chemistry, unspecified: Secondary | ICD-10-CM

## 2013-08-01 LAB — HEPATIC FUNCTION PANEL
Alkaline Phosphatase: 494 U/L — ABNORMAL HIGH (ref 39–117)
Bilirubin, Direct: 2.9 mg/dL — ABNORMAL HIGH (ref 0.0–0.3)
Indirect Bilirubin: 0.9 mg/dL (ref 0.3–0.9)
Total Bilirubin: 3.8 mg/dL — ABNORMAL HIGH (ref 0.3–1.2)

## 2013-08-01 LAB — BASIC METABOLIC PANEL
CO2: 28 mEq/L (ref 19–32)
Calcium: 8.9 mg/dL (ref 8.4–10.5)
Creatinine, Ser: 1.33 mg/dL (ref 0.50–1.35)
GFR calc non Af Amer: 50 mL/min — ABNORMAL LOW (ref >90)
Glucose, Bld: 153 mg/dL — ABNORMAL HIGH (ref 70–99)

## 2013-08-01 LAB — GLUCOSE, CAPILLARY
Glucose-Capillary: 235 mg/dL — ABNORMAL HIGH (ref 70–99)
Glucose-Capillary: 186 mg/dL — ABNORMAL HIGH (ref 70–99)

## 2013-08-01 LAB — CBC
Hemoglobin: 10.5 g/dL — ABNORMAL LOW (ref 13.0–17.0)
MCH: 31 pg (ref 26.0–34.0)
MCHC: 33.9 g/dL (ref 30.0–36.0)
MCV: 91.4 fL (ref 78.0–100.0)
Platelets: 184 10*3/uL (ref 150–400)
RBC: 3.39 MIL/uL — ABNORMAL LOW (ref 4.22–5.81)

## 2013-08-01 LAB — LIPASE, BLOOD: Lipase: 35 U/L (ref 11–59)

## 2013-08-01 LAB — MAGNESIUM: Magnesium: 1.8 mg/dL (ref 1.5–2.5)

## 2013-08-01 MED ORDER — SODIUM CHLORIDE 0.9 % IV SOLN
INTRAVENOUS | Status: AC
  Filled 2013-08-01: qty 3

## 2013-08-01 NOTE — Consult Note (Signed)
Referring Provider: No ref. provider found Primary Care Physician:  [name], MD Primary Gastroenterologist:  Dr. Karilyn Cota  Reason for Consultation:  Biliary pancreatitis.  HPI:  Patient is 79 year old Caucasian male who who was in usual state of health until yesterday morning when he woke up around 1 AM with nausea and vomiting. He apparently had multiple episodes of vomiting between 1 and 7 AM. He was noted to be in pain when his wife saw him around 7 AM. He was holding his abdomen. He was clammy and shaky. He was also confused. She thought he was having a stroke. He was brought to emergency room by ambulance. On evaluation he was noted to be febrile. His transaminases and bilirubin were elevated. Serum lipase is also elevated. He was therefore felt to have acute pancreatitis. He had abdominal ultrasound which revealed cholelithiasis but his bile duct was 5.7 mm. There was no evidence of gallbladder wall thickening. He subsequently had abdominal pelvic CT revealing cholelithiasis but non-dilated bile duct and normal-appearing pancreas. Patient states he felt pain when he was having ultrasound examination. He states the night before his illness he had dinner at [employer] and had a large steak. There is no history of hematemesis melena or rectal bleeding. His heartburn is well controlled with PPI. Review of systems is positive for 8 pound weight loss since he has changed his diet under supervision by Dr. Fransico Him because of his diabetes. This morning he feels fine. He denies nausea or abdominal pain. He is hungry. He has chronic GERD which was diagnosed in 2003. He has history of colonic polyps. He underwent colonoscopy by Dr. Benard Rink in March 2013 with removal of at least 4 tubular adenomas. He has macular degeneration and and does not drive. Review of the systems is also positive for cramps or spasms to both hands which he has had for a while.  Past Medical History  Diagnosis Date  .  Macular degeneration   . Diabetes mellitus   . Hypertension   . GERD (gastroesophageal reflux disease)   . Gout   . Arthritis   . Cancer     skin cancer of forehead  . Hyperlipidemia   . Erectile dysfunction   . RBBB   . CAD (coronary artery disease)     Past Surgical History  Procedure Laterality Date  . Coronary artery bypass graft  02-28-2011    LIMA to LAD,SVG to acute marginal branch of RCA & sequential SVG to OM1 & OM2.  . Pilonidal cyst excision    . Colonoscopy  02/25/2012    Procedure: COLONOSCOPY;  Surgeon: Corbin Ade, MD;  Location: [facility];  Service: Endoscopy;  Laterality: N/A;  7:30 AM  . US echocardiography  11/09/2008    mildly impaired diastolic dysfunction - Grade I  . Nm myocar perf wall motion  02/15/2011    High Risk  . Cardiac catheterization  02/19/2011    3 vessel CAD    Prior to Admission medications   Medication Sig Start Date End Date Taking? Authorizing Provider  allopurinol (ZYLOPRIM) 100 MG tablet Take 100 mg by mouth daily.   Yes Historical Provider, MD  aspirin 325 MG tablet Take 325 mg by mouth daily.   Yes Historical Provider, MD  atorvastatin (LIPITOR) 40 MG tablet Take 40 mg by mouth daily.   Yes Historical Provider, MD  carvedilol (COREG) 12.5 MG tablet Take 12.5 mg by mouth 2 (two) times daily with a meal.   Yes Historical Provider,  MD  metFORMIN (GLUCOPHAGE) 500 MG tablet Take 1,000 mg by mouth 2 (two) times daily with a meal.    Yes Historical Provider, MD  Multiple Vitamins-Iron (MULTIVITAMINS WITH IRON) TABS Take 1 tablet by mouth daily.   Yes Historical Provider, MD  Multiple Vitamins-Minerals (PRESERVISION AREDS PO) Take 1 capsule by mouth 2 (two) times daily.   Yes Historical Provider, MD  naproxen sodium (ANAPROX) 220 MG tablet Take 220 mg by mouth daily as needed (headache).   Yes Historical Provider, MD  quinapril (ACCUPRIL) 20 MG tablet Take 20 mg by mouth daily.    Yes Historical Provider, MD  ranitidine (ZANTAC) 150 MG  tablet Take 150 mg by mouth 2 (two) times daily.   Yes Historical Provider, MD    Current Facility-Administered Medications  Medication Dose Route Frequency Provider Last Rate Last Dose  . 0.9 %  sodium chloride infusion   Intravenous Continuous Toy Baker, MD      . Ampicillin-Sulbactam (UNASYN) 3 g in sodium chloride 0.9 % 100 mL IVPB  3 g Intravenous Q8H Pamella Pert, MD   3 g at 08/01/13 1050  . aspirin tablet 325 mg  325 mg Oral Daily Pamella Pert, MD   325 mg at 08/01/13 1049  . atorvastatin (LIPITOR) tablet 40 mg  40 mg Oral Daily Pamella Pert, MD   40 mg at 08/01/13 1050  . carvedilol (COREG) tablet 12.5 mg  12.5 mg Oral BID WC Pamella Pert, MD   12.5 mg at 08/01/13 0749  . famotidine (PEPCID) tablet 20 mg  20 mg Oral BID Pamella Pert, MD   20 mg at 08/01/13 1050  . heparin injection 5,000 Units  5,000 Units Subcutaneous Q8H Pamella Pert, MD   5,000 Units at 08/01/13 0612  . insulin aspart (novoLOG) injection 0-9 Units  0-9 Units Subcutaneous TID WC Pamella Pert, MD   1 Units at 08/01/13 0748  . lisinopril (PRINIVIL,ZESTRIL) tablet 5 mg  5 mg Oral Daily Pamella Pert, MD   5 mg at 08/01/13 1050  . multivitamin with minerals tablet 1 tablet  1 tablet Oral Daily Pamella Pert, MD   1 tablet at 08/01/13 1050  . ondansetron (ZOFRAN) tablet 4 mg  4 mg Oral Q6H PRN Pamella Pert, MD       Or  . ondansetron (ZOFRAN) injection 4 mg  4 mg Intravenous Q6H PRN Pamella Pert, MD      . oxyCODONE (Oxy IR/ROXICODONE) immediate release tablet 5 mg  5 mg Oral Q6H PRN Costin Gherghe, MD      . sodium chloride 0.9 % injection 3 mL  3 mL Intravenous Q12H Pamella Pert, MD   3 mL at 07/31/13 2200  . sodium polystyrene (KAYEXALATE) 15 GM/60ML suspension 15 g  15 g Oral Once Pamella Pert, MD        Allergies as of 07/31/2013 - Review Complete 07/31/2013  Allergen Reaction Noted  . Amiodarone Nausea Only 07/23/2013    Family History  Problem Relation Age of Onset  .  Anesthesia problems Neg Hx   . Hypotension Neg Hx   . Malignant hyperthermia Neg Hx   . Pseudochol deficiency Neg Hx   . Stroke Father   . Heart failure Brother   . Cancer Sister   . Hypertension Sister   . Heart failure Sister     History   Social History  . Marital Status: Married    Spouse Name: N/A    Number of Children: N/A  . Years  of Education: N/A   Occupational History  . Not on file.   Social History Main Topics  . Smoking status: Former Smoker -- 0.50 packs/day for 25 years    Types: Cigarettes    Quit date: 02/25/1983  . Smokeless tobacco: Not on file  . Alcohol Use: Yes     Comment: occasional  . Drug Use: No  . Sexual Activity: No   Other Topics Concern  . Not on file   Social History Narrative  . No narrative on file    Review of Systems: See HPI, otherwise normal ROS  Physical Exam: Temp:  [98.2 F (36.8 C)-100.5 F (38.1 C)] 98.4 F (36.9 C) (08/30 0609) Pulse Rate:  [60-89] 60 (08/30 0749) Resp:  [15-20] 20 (08/30 0609) BP: (113-155)/(48-78) 126/68 mmHg (08/30 0749) SpO2:  [95 %-100 %] 98 % (08/30 0609) Weight:  [194 lb 0.1 oz (88 kg)] 194 lb 0.1 oz (88 kg) (08/29 1911) Patient is alert and in no acute distress. Conjunctiva is pink. Sclerae nonicteric. Oropharyngeal mucosa is normal. He has upper dental plate. No neck masses or thyromegaly noted. Cardiac exam with regular rhythm normal S1 and S2. No murmur or gallop noted.   lungs are clear to auscultation. Abdomen is full with normal bowel sounds. It is soft with mild midepigastric tenderness. No organomegaly or masses. No LE edema or clubbing noted.  Lab Results:  Recent Labs  07/31/13 1003 08/01/13 0615  WBC 11.6* 7.9  HGB 11.6* 10.5*  HCT 33.9* 31.0*  PLT 189 184   BMET  Recent Labs  07/31/13 1003 08/01/13 0615  NA 134* 139  K 5.2* 4.4  CL 97 102  CO2 27 28  GLUCOSE 255* 153*  BUN 27* 20  CREATININE 1.32 1.33  CALCIUM 9.2 8.9   LFT  Recent Labs   08/01/13 0615  PROT 6.0  ALBUMIN 3.0*  AST 158*  ALT 230*  ALKPHOS 494*  BILITOT 3.8*  BILIDIR 2.9*  IBILI 0.9   PT/INR  Recent Labs  08/01/13 0615  LABPROT 14.7  INR 1.17   Hepatitis Panel No results found for this basename: HEPBSAG, HCVAB, HEPAIGM, HEPBIGM,  in the last 72 hours  Studies/Results: Ct Abdomen Pelvis Wo Contrast  07/31/2013   *RADIOLOGY REPORT*  Clinical Data: Abdominal pain, history of diabetes  CT ABDOMEN AND PELVIS WITHOUT CONTRAST  Technique:  Multidetector CT imaging of the abdomen and pelvis was performed following the standard protocol without intravenous contrast.  Comparison: None.  Findings: Sagittal images of the spine shows diffuse osteopenia. Disc space flattening at L4-L5 and L5 S1 level.  Disc space flattening with vacuum disc phenomenon at T12-L1 level.  Lung bases are unremarkable.  Small hiatal hernia.  Mild thickening of distal esophageal wall probable from gastroesophageal reflux disease.  Atherosclerotic calcifications of the abdominal aorta and the iliac arteries.  Unenhanced liver shows no biliary ductal dilatation.  Small calcified gallstones are noted in gallbladder fundal region the largest measures 4 mm.  Unenhanced pancreas, spleen and adrenal glands are unremarkable. Unenhanced kidneys shows cortical thinning probable due to atrophy. There is irregular contour of the left kidney probable due to cortical scarring.  No nephrolithiasis.  No hydronephrosis or hydroureter.  Bilateral no calcified ureteral calculi are noted.  No aortic aneurysm.  Oral contrast material was given to the patient.  No small bowel obstruction.  There is no pericecal inflammation.  Normal appendix is clearly visualized axial image 57.  Mild distended urinary bladder.  Bilateral distal ureter  is unremarkable.  No distal colonic obstruction.  Bilateral inguinal scrotal canal hernia containing fat is noted without evidence of acute complication. Prostate gland calcifications are  noted.  The seminal vesicles are unremarkable.  IMPRESSION:  1.  Small hiatal hernia.  Mild thickening of distal esophageal wall probable from gastroesophageal reflux. 2.  Bilateral mild renal cortical thinning probable due to atrophy. Probable cortical scarring left kidney.  No nephrolithiasis.  No hydronephrosis or hydroureter.  No calcified ureteral calculi. 3.  No small bowel or colonic obstruction. 4.  No pericecal inflammation.  Normal appendix is clearly visualized. 5.  Mild distended urinary bladder.  No calcified calculi are noted within bladder. 6.  Bilateral inguinal scrotal canal hernia containing fat without evidence of acute complication.   Original Report Authenticated By: Natasha Mead, M.D.   US Abdomen Complete  07/31/2013   *RADIOLOGY REPORT*  Clinical Data:  Abdominal pain.  ABDOMINAL ULTRASOUND COMPLETE  Comparison:  None.  Findings:  Gallbladder:  There appears to be a combination of gallstones and sludge within the gallbladder lumen.  Minimal gallbladder wall thickening is noted.  No pericholecystic fluid is noted.  No sonographic Murphy's sign is noted.  Common Bile Duct:  Measures 5.7 mm which is within normal limits in caliber.  Liver: No focal mass lesion identified.  Within normal limits in parenchymal echogenicity.  IVC:  Appears normal.  Pancreas:  Pancreatic head appears normal.  The body and tail are not well visualized due to overlying bowel gas.  Spleen:  Within normal limits in size and echotexture.  Right kidney:  Measures 10.7 cm in length. 12 mm hypoechoic area is seen in the cortex of the upper pole of the right kidney. Normal in size and parenchymal echogenicity.  No evidence of mass or hydronephrosis.  Left kidney:  Measures 12.5 cm in length. Normal in size and parenchymal echogenicity.  No evidence of mass or hydronephrosis.  Abdominal Aorta:  Atheromatous disease is seen involving the visualized abdominal aorta.  IMPRESSION: Probable combination of gallstones and sludge  seen in gallbladder lumen with minimal gallbladder wall thickening.  No pericholecystic fluid or sonographic Murphy's sign is noted.  12 mm hypoechoic area is seen in upper pole of right kidney; this may simply represent a cyst, but it is not diagnostic by ultrasound criteria. Further evaluation with CT or MRI scan with contrast is recommended to rule out mass.   Original Report Authenticated By: Lupita Raider.,  M.D.    I have reviewed patient's ultrasound and abdominopelvic CT.  Assessment; Patient has biliary pancreatitis. He appears to be improving with current therapy. Bile duct caliber normal on both ultrasound and CT. Since his transaminases are coming down there is no indication for ERCP. Patient will need cholecystectomy which he desires to have either by Dr. Luretha Murphy or Dr. Dwain Sarna.  Recommendations; Clear liquids. Repeat LFTs in a.m. Surgical consultation on outpatient basis for laparoscopic cholecystectomy with intraoperative cholangiogram.     LOS: 1 day   Marquelle Balow U  08/01/2013, 11:19 AM

## 2013-08-01 NOTE — Progress Notes (Signed)
[facility] PROGRESS NOTE  EDIE DARLEY NWG:[id number] DOB: May 11, 1934 DOA: 07/31/2013 PCP: [name], MD  HPI: Grant [name] is a 79 y.o. male has a past medical history significant for coronary artery disease status post CABG, hypertension, diabetes controlled with oral medications, hyperlipidemia, presents to the emergency department with a chief complaint of nausea and vomiting that started the morning of admission.   Assessment/Plan: Acute pancreatitis - mild lipase elevation and some evidence of gallstones point to the fact that he may have passed a stone which could cause mild pancreatic inflammation.  - appreciate GI input, monitor LFTs for tomorrow morning and re-evaluate Nausea and vomiting - likely secondary to acute pancreatitis. Supportive treatment.  - improved, clear liquid diet Concern for cholangitis given fever  - Unasyn was started in the ED, would continue that for now but his fever might have been related to his pancreatitis. Monitor  Elevated liver enzymes - cholangitis vs stone migration. Mixed cholestatic and hepatocellular injury.  - AST/ALT improved this morning, T bili increased HTN - continue home meds  DM 2 - SSI  CAD - continue home meds  DVT prophylaxis - heparin s.q.  Code Status: Full Family Communication: wife at bedside  Disposition Plan: remain inpatient, home when medically ready  Consultants:  Gastroenterology  Procedures:  none  Anti-infectives   Start     Dose/Rate Route Frequency Ordered Stop   07/31/13 1845  Ampicillin-Sulbactam (UNASYN) 3 g in sodium chloride 0.9 % 100 mL IVPB     3 g 100 mL/hr over 60 Minutes Intravenous Every 8 hours 07/31/13 1833     07/31/13 1715  Ampicillin-Sulbactam (UNASYN) 3 g in sodium chloride 0.9 % 100 mL IVPB     3 g 100 mL/hr over 60 Minutes Intravenous  Once 07/31/13 1701 07/31/13 1823     Antibiotics Given (last 72 hours)   Date/Time Action Medication Dose Rate   08/01/13 0231  Given   Ampicillin-Sulbactam (UNASYN) 3 g in sodium chloride 0.9 % 100 mL IVPB 3 g 100 mL/hr   08/01/13 1050 Given   Ampicillin-Sulbactam (UNASYN) 3 g in sodium chloride 0.9 % 100 mL IVPB 3 g 100 mL/hr      HPI/Subjective: - feeling well this morning, no nausea  Objective: Filed Vitals:   07/31/13 1911 07/31/13 2145 08/01/13 0609 08/01/13 0749  BP: 155/78 154/76 133/61 126/68  Pulse: 67 65 62 60  Temp: 98.2 F (36.8 C) 98.4 F (36.9 C) 98.4 F (36.9 C)   TempSrc: Oral Oral Oral   Resp: 20 20 20    Height: 6' (1.829 m)     Weight: 88 kg (194 lb 0.1 oz)     SpO2: 100% 98% 98%     Intake/Output Summary (Last 24 hours) at 08/01/13 1134 Last data filed at 08/01/13 0610  Gross per 24 hour  Intake      0 ml  Output   1200 ml  Net  -1200 ml   Filed Weights   07/31/13 0922 07/31/13 1911  Weight: 87.091 kg (192 lb) 88 kg (194 lb 0.1 oz)    Exam:   General:  NAD  Cardiovascular: regular rate and rhythm, without MRG  Respiratory: good air movement, clear to auscultation throughout, no wheezing, ronchi or rales  Abdomen: soft, not tender to palpation, positive bowel sounds  MSK: no peripheral edema  Neuro: CN 2-12 grossly intact, MS 5/5 in all 4  Data Reviewed: Basic Metabolic Panel:  Recent Labs Lab 07/31/13 1003 08/01/13 0615  NA 134* 139  K 5.2* 4.4  CL 97 102  CO2 27 28  GLUCOSE 255* 153*  BUN 27* 20  CREATININE 1.32 1.33  CALCIUM 9.2 8.9  MG  --  1.8  PHOS  --  2.6   Liver Function Tests:  Recent Labs Lab 07/31/13 1003 08/01/13 0615  AST 298* 158*  ALT 314* 230*  ALKPHOS 574* 494*  BILITOT 2.8* 3.8*  PROT 6.8 6.0  ALBUMIN 3.5 3.0*    Recent Labs Lab 07/31/13 1003 08/01/13 1055  LIPASE 211* 35   CBC:  Recent Labs Lab 07/31/13 1003 08/01/13 0615  WBC 11.6* 7.9  NEUTROABS 10.8*  --   HGB 11.6* 10.5*  HCT 33.9* 31.0*  MCV 90.6 91.4  PLT 189 184   CBG:  Recent Labs Lab 07/31/13 1830 08/01/13 0731  GLUCAP 209* 144*    Studies: Ct Abdomen Pelvis Wo Contrast  07/31/2013   *RADIOLOGY REPORT*  Clinical Data: Abdominal pain, history of diabetes  CT ABDOMEN AND PELVIS WITHOUT CONTRAST  Technique:  Multidetector CT imaging of the abdomen and pelvis was performed following the standard protocol without intravenous contrast.  Comparison: None.  Findings: Sagittal images of the spine shows diffuse osteopenia. Disc space flattening at L4-L5 and L5 S1 level.  Disc space flattening with vacuum disc phenomenon at T12-L1 level.  Lung bases are unremarkable.  Small hiatal hernia.  Mild thickening of distal esophageal wall probable from gastroesophageal reflux disease.  Atherosclerotic calcifications of the abdominal aorta and the iliac arteries.  Unenhanced liver shows no biliary ductal dilatation.  Small calcified gallstones are noted in gallbladder fundal region the largest measures 4 mm.  Unenhanced pancreas, spleen and adrenal glands are unremarkable. Unenhanced kidneys shows cortical thinning probable due to atrophy. There is irregular contour of the left kidney probable due to cortical scarring.  No nephrolithiasis.  No hydronephrosis or hydroureter.  Bilateral no calcified ureteral calculi are noted.  No aortic aneurysm.  Oral contrast material was given to the patient.  No small bowel obstruction.  There is no pericecal inflammation.  Normal appendix is clearly visualized axial image 57.  Mild distended urinary bladder.  Bilateral distal ureter is unremarkable.  No distal colonic obstruction.  Bilateral inguinal scrotal canal hernia containing fat is noted without evidence of acute complication. Prostate gland calcifications are noted.  The seminal vesicles are unremarkable.  IMPRESSION:  1.  Small hiatal hernia.  Mild thickening of distal esophageal wall probable from gastroesophageal reflux. 2.  Bilateral mild renal cortical thinning probable due to atrophy. Probable cortical scarring left kidney.  No nephrolithiasis.  No  hydronephrosis or hydroureter.  No calcified ureteral calculi. 3.  No small bowel or colonic obstruction. 4.  No pericecal inflammation.  Normal appendix is clearly visualized. 5.  Mild distended urinary bladder.  No calcified calculi are noted within bladder. 6.  Bilateral inguinal scrotal canal hernia containing fat without evidence of acute complication.   Original Report Authenticated By: Natasha Mead, M.D.   US Abdomen Complete  07/31/2013   *RADIOLOGY REPORT*  Clinical Data:  Abdominal pain.  ABDOMINAL ULTRASOUND COMPLETE  Comparison:  None.  Findings:  Gallbladder:  There appears to be a combination of gallstones and sludge within the gallbladder lumen.  Minimal gallbladder wall thickening is noted.  No pericholecystic fluid is noted.  No sonographic Murphy's sign is noted.  Common Bile Duct:  Measures 5.7 mm which is within normal limits in caliber.  Liver: No focal mass lesion identified.  Within normal  limits in parenchymal echogenicity.  IVC:  Appears normal.  Pancreas:  Pancreatic head appears normal.  The body and tail are not well visualized due to overlying bowel gas.  Spleen:  Within normal limits in size and echotexture.  Right kidney:  Measures 10.7 cm in length. 12 mm hypoechoic area is seen in the cortex of the upper pole of the right kidney. Normal in size and parenchymal echogenicity.  No evidence of mass or hydronephrosis.  Left kidney:  Measures 12.5 cm in length. Normal in size and parenchymal echogenicity.  No evidence of mass or hydronephrosis.  Abdominal Aorta:  Atheromatous disease is seen involving the visualized abdominal aorta.  IMPRESSION: Probable combination of gallstones and sludge seen in gallbladder lumen with minimal gallbladder wall thickening.  No pericholecystic fluid or sonographic Murphy's sign is noted.  12 mm hypoechoic area is seen in upper pole of right kidney; this may simply represent a cyst, but it is not diagnostic by ultrasound criteria. Further evaluation with CT  or MRI scan with contrast is recommended to rule out mass.   Original Report Authenticated By: Lupita Raider.,  M.D.    Scheduled Meds: . ampicillin-sulbactam (UNASYN) IV  3 g Intravenous Q8H  . aspirin  325 mg Oral Daily  . atorvastatin  40 mg Oral Daily  . carvedilol  12.5 mg Oral BID WC  . famotidine  20 mg Oral BID  . heparin  5,000 Units Subcutaneous Q8H  . insulin aspart  0-9 Units Subcutaneous TID WC  . lisinopril  5 mg Oral Daily  . multivitamin with minerals  1 tablet Oral Daily  . sodium chloride  3 mL Intravenous Q12H  . sodium polystyrene  15 g Oral Once   Continuous Infusions: . sodium chloride Stopped (07/31/13 1824)    Active Problems:   CAD (coronary artery disease)   Hypertension   Pancreatitis   Hyperkalemia   Diabetes mellitus, type 2   Elevated liver enzymes  Time spent: 25  Pamella Pert, MD [facility] Pager [phone]. If 7 PM - 7 AM, please contact night-coverage at [email or web address], password [facility] 08/01/2013, 11:34 AM  LOS: 1 day

## 2013-08-02 LAB — PROTIME-INR
INR: 1.17 (ref 0.00–1.49)
Prothrombin Time: 14.7 seconds (ref 11.6–15.2)

## 2013-08-02 LAB — CBC
MCH: 30.9 pg (ref 26.0–34.0)
Platelets: 182 10*3/uL (ref 150–400)
RBC: 3.4 MIL/uL — ABNORMAL LOW (ref 4.22–5.81)
WBC: 5.2 10*3/uL (ref 4.0–10.5)

## 2013-08-02 LAB — BASIC METABOLIC PANEL
CO2: 27 mEq/L (ref 19–32)
Calcium: 8.9 mg/dL (ref 8.4–10.5)
Sodium: 138 mEq/L (ref 135–145)

## 2013-08-02 LAB — GLUCOSE, CAPILLARY: Glucose-Capillary: 174 mg/dL — ABNORMAL HIGH (ref 70–99)

## 2013-08-02 LAB — HEPATIC FUNCTION PANEL
ALT: 197 U/L — ABNORMAL HIGH (ref 0–53)
Bilirubin, Direct: 2.9 mg/dL — ABNORMAL HIGH (ref 0.0–0.3)
Indirect Bilirubin: 0.7 mg/dL (ref 0.3–0.9)

## 2013-08-02 LAB — AMYLASE: Amylase: 39 U/L (ref 0–105)

## 2013-08-02 MED ORDER — CHLORHEXIDINE GLUCONATE 0.12 % MT SOLN
15.0000 mL | Freq: Two times a day (BID) | OROMUCOSAL | Status: DC
  Administered 2013-08-02 – 2013-08-04 (×5): 15 mL via OROMUCOSAL
  Filled 2013-08-02 (×5): qty 15

## 2013-08-02 NOTE — Progress Notes (Signed)
Subjective; Patient continues to feel better. He had no difficulty with clear liquids. He denies nausea vomiting or abdominal pain.  Objective; BP 182/64  Pulse 61  Temp(Src) 97.8 F (36.6 C) (Oral)  Resp 20  Ht 6' (1.829 m)  Wt 194 lb 0.1 oz (88 kg)  BMI 26.31 kg/m2  SpO2 100% Patient is alert and appears to be comfortable. Abdomen is full with normal bowel sounds. Palpation reveals soft abdomen with mild midepigastric tenderness without guarding. No organomegaly or masses.  Lab data; WBC 5.2, H&H 10.5 and 31.4 and platelet count 182K. Serum amylase 39 Bilirubin 3.6 direct 2.9 AP 559, AST 143 and ALT 197  Assessment; #1. Biliary pancreatitis. Continued symptomatic and objective improvement. In significant drop in direct bilirubin in the last 24 hours her transaminases are trending down but AP is higher. #2. Anemia. Possibly of chronic disease but will rule out iron B12 folate deficiency. Patient's last colonoscopy was in March 2013.  Recommendations; Advance diet to full liquids. LFTs in am. Serum iron TIBC ferritin folate and B12 levels in a.m.

## 2013-08-02 NOTE — Progress Notes (Signed)
[facility] PROGRESS NOTE  KALADIN NOSEWORTHY WJX:[id number] DOB: 09/16/1934 DOA: 07/31/2013 PCP: [name], MD  HPI: Grant [name] is a 79 y.o. male has a past medical history significant for coronary artery disease status post CABG, hypertension, diabetes controlled with oral medications, hyperlipidemia, presents to the emergency department with a chief complaint of nausea and vomiting that started the morning of admission.   Assessment/Plan: Acute pancreatitis - mild lipase elevation and some evidence of gallstones point to the fact that he may have passed a stone which could cause mild pancreatic inflammation.  - appreciate GI input - AST and ALT somewhat improved today, alkaline phosphatase however it's increased. T. bili relatively stable, trending downward a bit. We'll continue to monitor with morning labs tomorrow. Nausea and vomiting - likely secondary to acute pancreatitis. Supportive treatment.  - improved, clear liquid diet Concern for cholangitis given fever  - Unasyn was started in the ED, would continue that for now but his fever might have been related to his pancreatitis. Monitor  Elevated liver enzymes - cholangitis vs stone migration. Mixed cholestatic and hepatocellular injury.  HTN - continue home meds  DM 2 - SSI, sugars with decent control. CAD - continue home meds  DVT prophylaxis - heparin s.q.  Code Status: Full Family Communication: wife at bedside  Disposition Plan: remain inpatient, home when medically ready  Consultants:  Gastroenterology  Procedures:  none  Anti-infectives   Start     Dose/Rate Route Frequency Ordered Stop   07/31/13 1845  Ampicillin-Sulbactam (UNASYN) 3 g in sodium chloride 0.9 % 100 mL IVPB     3 g 100 mL/hr over 60 Minutes Intravenous Every 8 hours 07/31/13 1833     07/31/13 1715  Ampicillin-Sulbactam (UNASYN) 3 g in sodium chloride 0.9 % 100 mL IVPB     3 g 100 mL/hr over 60 Minutes Intravenous  Once 07/31/13  1701 07/31/13 1823     Antibiotics Given (last 72 hours)   Date/Time Action Medication Dose Rate   08/01/13 0231 Given   Ampicillin-Sulbactam (UNASYN) 3 g in sodium chloride 0.9 % 100 mL IVPB 3 g 100 mL/hr   08/01/13 1050 Given   Ampicillin-Sulbactam (UNASYN) 3 g in sodium chloride 0.9 % 100 mL IVPB 3 g 100 mL/hr   08/01/13 1855 Given   Ampicillin-Sulbactam (UNASYN) 3 g in sodium chloride 0.9 % 100 mL IVPB 3 g 100 mL/hr   08/02/13 0344 Given   Ampicillin-Sulbactam (UNASYN) 3 g in sodium chloride 0.9 % 100 mL IVPB 3 g 100 mL/hr      HPI/Subjective: - feeling well this morning, no nausea  Objective: Filed Vitals:   08/01/13 1412 08/01/13 1718 08/01/13 2056 08/02/13 0551  BP: 132/53 154/64 158/78 165/74  Pulse: 56 67 60 61  Temp: 97.9 F (36.6 C)  98.2 F (36.8 C) 97.8 F (36.6 C)  TempSrc:   Oral Oral  Resp: 18  20 20   Height:      Weight:      SpO2: 98%  100% 100%    Intake/Output Summary (Last 24 hours) at 08/02/13 0935 Last data filed at 08/02/13 0842  Gross per 24 hour  Intake 2580.5 ml  Output   2275 ml  Net  305.5 ml   Filed Weights   07/31/13 0922 07/31/13 1911  Weight: 87.091 kg (192 lb) 88 kg (194 lb 0.1 oz)    Exam:  General:  NAD  Cardiovascular: regular rate and rhythm, without MRG  Respiratory: good air movement,  clear to auscultation throughout, no wheezing, ronchi or rales  Abdomen: soft, not tender to palpation, positive bowel sounds  MSK: no peripheral edema  Neuro: CN 2-12 grossly intact, MS 5/5 in all 4  Data Reviewed: Basic Metabolic Panel:  Recent Labs Lab 07/31/13 1003 08/01/13 0615 08/02/13 0616  NA 134* 139 138  K 5.2* 4.4 4.6  CL 97 102 103  CO2 27 28 27   GLUCOSE 255* 153* 155*  BUN 27* 20 14  CREATININE 1.32 1.33 1.22  CALCIUM 9.2 8.9 8.9  MG  --  1.8  --   PHOS  --  2.6  --    Liver Function Tests:  Recent Labs Lab 07/31/13 1003 08/01/13 0615 08/02/13 0616  AST 298* 158* 143*  ALT 314* 230* 197*  ALKPHOS  574* 494* 559*  BILITOT 2.8* 3.8* 3.6*  PROT 6.8 6.0 6.1  ALBUMIN 3.5 3.0* 2.9*    Recent Labs Lab 07/31/13 1003 08/01/13 1055 08/02/13 0616  LIPASE 211* 35  --   AMYLASE  --   --  39   CBC:  Recent Labs Lab 07/31/13 1003 08/01/13 0615 08/02/13 0616  WBC 11.6* 7.9 5.2  NEUTROABS 10.8*  --   --   HGB 11.6* 10.5* 10.5*  HCT 33.9* 31.0* 31.4*  MCV 90.6 91.4 92.4  PLT 189 184 182   CBG:  Recent Labs Lab 08/01/13 0731 08/01/13 1146 08/01/13 1647 08/01/13 2053 08/02/13 0724  GLUCAP 144* 235* 180* 186* 146*   Studies: Ct Abdomen Pelvis Wo Contrast  07/31/2013   *RADIOLOGY REPORT*  Clinical Data: Abdominal pain, history of diabetes  CT ABDOMEN AND PELVIS WITHOUT CONTRAST  Technique:  Multidetector CT imaging of the abdomen and pelvis was performed following the standard protocol without intravenous contrast.  Comparison: None.  Findings: Sagittal images of the spine shows diffuse osteopenia. Disc space flattening at L4-L5 and L5 S1 level.  Disc space flattening with vacuum disc phenomenon at T12-L1 level.  Lung bases are unremarkable.  Small hiatal hernia.  Mild thickening of distal esophageal wall probable from gastroesophageal reflux disease.  Atherosclerotic calcifications of the abdominal aorta and the iliac arteries.  Unenhanced liver shows no biliary ductal dilatation.  Small calcified gallstones are noted in gallbladder fundal region the largest measures 4 mm.  Unenhanced pancreas, spleen and adrenal glands are unremarkable. Unenhanced kidneys shows cortical thinning probable due to atrophy. There is irregular contour of the left kidney probable due to cortical scarring.  No nephrolithiasis.  No hydronephrosis or hydroureter.  Bilateral no calcified ureteral calculi are noted.  No aortic aneurysm.  Oral contrast material was given to the patient.  No small bowel obstruction.  There is no pericecal inflammation.  Normal appendix is clearly visualized axial image 57.  Mild  distended urinary bladder.  Bilateral distal ureter is unremarkable.  No distal colonic obstruction.  Bilateral inguinal scrotal canal hernia containing fat is noted without evidence of acute complication. Prostate gland calcifications are noted.  The seminal vesicles are unremarkable.  IMPRESSION:  1.  Small hiatal hernia.  Mild thickening of distal esophageal wall probable from gastroesophageal reflux. 2.  Bilateral mild renal cortical thinning probable due to atrophy. Probable cortical scarring left kidney.  No nephrolithiasis.  No hydronephrosis or hydroureter.  No calcified ureteral calculi. 3.  No small bowel or colonic obstruction. 4.  No pericecal inflammation.  Normal appendix is clearly visualized. 5.  Mild distended urinary bladder.  No calcified calculi are noted within bladder. 6.  Bilateral inguinal scrotal  canal hernia containing fat without evidence of acute complication.   Original Report Authenticated By: Natasha Mead, M.D.   US Abdomen Complete  07/31/2013   *RADIOLOGY REPORT*  Clinical Data:  Abdominal pain.  ABDOMINAL ULTRASOUND COMPLETE  Comparison:  None.  Findings:  Gallbladder:  There appears to be a combination of gallstones and sludge within the gallbladder lumen.  Minimal gallbladder wall thickening is noted.  No pericholecystic fluid is noted.  No sonographic Murphy's sign is noted.  Common Bile Duct:  Measures 5.7 mm which is within normal limits in caliber.  Liver: No focal mass lesion identified.  Within normal limits in parenchymal echogenicity.  IVC:  Appears normal.  Pancreas:  Pancreatic head appears normal.  The body and tail are not well visualized due to overlying bowel gas.  Spleen:  Within normal limits in size and echotexture.  Right kidney:  Measures 10.7 cm in length. 12 mm hypoechoic area is seen in the cortex of the upper pole of the right kidney. Normal in size and parenchymal echogenicity.  No evidence of mass or hydronephrosis.  Left kidney:  Measures 12.5 cm in  length. Normal in size and parenchymal echogenicity.  No evidence of mass or hydronephrosis.  Abdominal Aorta:  Atheromatous disease is seen involving the visualized abdominal aorta.  IMPRESSION: Probable combination of gallstones and sludge seen in gallbladder lumen with minimal gallbladder wall thickening.  No pericholecystic fluid or sonographic Murphy's sign is noted.  12 mm hypoechoic area is seen in upper pole of right kidney; this may simply represent a cyst, but it is not diagnostic by ultrasound criteria. Further evaluation with CT or MRI scan with contrast is recommended to rule out mass.   Original Report Authenticated By: Lupita Raider.,  M.D.    Scheduled Meds: . ampicillin-sulbactam (UNASYN) IV  3 g Intravenous Q8H  . aspirin  325 mg Oral Daily  . atorvastatin  40 mg Oral Daily  . carvedilol  12.5 mg Oral BID WC  . chlorhexidine  15 mL Mouth/Throat BID  . famotidine  20 mg Oral BID  . heparin  5,000 Units Subcutaneous Q8H  . insulin aspart  0-9 Units Subcutaneous TID WC  . lisinopril  5 mg Oral Daily  . multivitamin with minerals  1 tablet Oral Daily  . sodium chloride  3 mL Intravenous Q12H  . sodium polystyrene  15 g Oral Once   Continuous Infusions: . sodium chloride 125 mL/hr at 08/01/13 1627    Active Problems:   CAD (coronary artery disease)   Hypertension   Pancreatitis   Hyperkalemia   Diabetes mellitus, type 2   Elevated liver enzymes  Time spent: 25  Pamella Pert, MD [facility] Pager [phone]. If 7 PM - 7 AM, please contact night-coverage at [email or web address], password [facility] 08/02/2013, 9:35 AM  LOS: 2 days

## 2013-08-03 DIAGNOSIS — E119 Type 2 diabetes mellitus without complications: Secondary | ICD-10-CM

## 2013-08-03 DIAGNOSIS — K859 Acute pancreatitis without necrosis or infection, unspecified: Secondary | ICD-10-CM

## 2013-08-03 DIAGNOSIS — E785 Hyperlipidemia, unspecified: Secondary | ICD-10-CM | POA: Insufficient documentation

## 2013-08-03 LAB — GLUCOSE, CAPILLARY
Glucose-Capillary: 143 mg/dL — ABNORMAL HIGH (ref 70–99)
Glucose-Capillary: 255 mg/dL — ABNORMAL HIGH (ref 70–99)
Glucose-Capillary: 261 mg/dL — ABNORMAL HIGH (ref 70–99)

## 2013-08-03 LAB — HEPATIC FUNCTION PANEL
AST: 189 U/L — ABNORMAL HIGH (ref 0–37)
Albumin: 2.9 g/dL — ABNORMAL LOW (ref 3.5–5.2)
Alkaline Phosphatase: 634 U/L — ABNORMAL HIGH (ref 39–117)
Total Protein: 6.2 g/dL (ref 6.0–8.3)

## 2013-08-03 NOTE — Progress Notes (Signed)
Subjective:  Feels well. No abdominal  pain. Tolerating regular diet the  Objective: Vital signs in last 24 hours: Temp:  [97.5 F (36.4 C)-98.3 F (36.8 C)] 98.3 F (36.8 C) (09/01 0557) Pulse Rate:  [57-62] 62 (09/01 0611) Resp:  [20] 20 (09/01 0557) BP: (147-175)/(60-74) 162/74 mmHg (09/01 0611) SpO2:  [98 %-100 %] 100 % (09/01 0557) Last BM Date: 07/31/13 General:   Pleasant, alert conversant. [place] by spouse.  Abdomen:  Soft, nontender and nondistended.  Normal bowel sounds, without guarding, and without rebound.  No mass or organomegaly. Extremities:  Without clubbing or edema.    Intake/Output from previous day: 08/31 0701 - 09/01 0700 In: 5098.8 [P.O.:1320; I.V.:3478.8; IV Piggyback:300] Out: 2250 [Urine:2250] Intake/Output this shift: Total I/O In: 240 [P.O.:240] Out: -   Lab Results:  Recent Labs  08/01/13 0615 08/02/13 0616  WBC 7.9 5.2  HGB 10.5* 10.5*  HCT 31.0* 31.4*  PLT 184 182   BMET  Recent Labs  08/01/13 0615 08/02/13 0616  NA 139 138  K 4.4 4.6  CL 102 103  CO2 28 27  GLUCOSE 153* 155*  BUN 20 14  CREATININE 1.33 1.22  CALCIUM 8.9 8.9   LFT  Recent Labs  08/03/13 0531  PROT 6.2  ALBUMIN 2.9*  AST 189*  ALT 229*  ALKPHOS 634*  BILITOT 3.1*  BILIDIR 2.4*  IBILI 0.7   PT/INR  Recent Labs  08/01/13 0615 08/02/13 0616  LABPROT 14.7 14.7  INR 1.17 1.17    Impression:  79 year old gentleman recent abdominal pain mildly elevated lipase. Cholelithiasis on ultrasound. Common bile duct not dilated on  CT. Pancreas is morphologically normal appearing on noncontrast study. Interestingly, LFTs modestly worsened. Even though common bile but not dilated, a small common duct stone is not excluded as is a small occult tumor in the head of the pancreas. Discussed with Dr. Karilyn Cota and Dr. Stevphen Meuse earlier today.    Recommendations:   Agree with proceeding on with an MRI/MRCP to evaluate further. Will reassess.

## 2013-08-03 NOTE — Progress Notes (Signed)
UR Chart Review Completed  

## 2013-08-03 NOTE — Assessment & Plan Note (Signed)
On high dose statin, fair lipid control

## 2013-08-03 NOTE — Assessment & Plan Note (Signed)
Mild, well controlled

## 2013-08-03 NOTE — Progress Notes (Signed)
[facility] PROGRESS NOTE  Grant [name] WUJ:[id number] DOB: [birth year] DOA: 07/31/2013 PCP: [name], MD  HPI: Grant [name] is a 79 y.o. male has a past medical history significant for coronary artery disease status post CABG, hypertension, diabetes controlled with oral medications, hyperlipidemia, presents to the emergency department with a chief complaint of nausea and vomiting that started the morning of admission.   Assessment/Plan: Acute pancreatitis - mild lipase elevation and some evidence of gallstones point to the fact that he may have passed a stone which could cause mild pancreatic inflammation.  - appreciate GI input - LFTs somewhat worsening today, wil try and arrange for MRI/MRCP, cannot do it here but will try to transport to [facility] and back today.  Nausea and vomiting - likely secondary to acute pancreatitis. Supportive treatment.  - improved, tolerating diet Concern for cholangitis given fever  - Unasyn was started in the ED, would continue that for now but his fever might have been related to his pancreatitis. Monitor  Elevated liver enzymes - cholangitis vs stone migration. Mixed cholestatic and hepatocellular injury.  HTN - continue home meds  DM 2 - SSI, sugars with decent control. CAD - continue home meds  DVT prophylaxis - heparin s.q.  Code Status: Full Family Communication: wife at bedside  Disposition Plan: remain inpatient, home when medically ready  Consultants:  Gastroenterology  Procedures:  none  Anti-infectives   Start     Dose/Rate Route Frequency Ordered Stop   07/31/13 1845  Ampicillin-Sulbactam (UNASYN) 3 g in sodium chloride 0.9 % 100 mL IVPB     3 g 100 mL/hr over 60 Minutes Intravenous Every 8 hours 07/31/13 1833     07/31/13 1715  Ampicillin-Sulbactam (UNASYN) 3 g in sodium chloride 0.9 % 100 mL IVPB     3 g 100 mL/hr over 60 Minutes Intravenous  Once 07/31/13 1701 07/31/13 1823     Antibiotics Given (last 72  hours)   Date/Time Action Medication Dose Rate   08/01/13 0231 Given   Ampicillin-Sulbactam (UNASYN) 3 g in sodium chloride 0.9 % 100 mL IVPB 3 g 100 mL/hr   08/01/13 1050 Given   Ampicillin-Sulbactam (UNASYN) 3 g in sodium chloride 0.9 % 100 mL IVPB 3 g 100 mL/hr   08/01/13 1855 Given   Ampicillin-Sulbactam (UNASYN) 3 g in sodium chloride 0.9 % 100 mL IVPB 3 g 100 mL/hr   08/02/13 0344 Given   Ampicillin-Sulbactam (UNASYN) 3 g in sodium chloride 0.9 % 100 mL IVPB 3 g 100 mL/hr   08/02/13 1042 Given   Ampicillin-Sulbactam (UNASYN) 3 g in sodium chloride 0.9 % 100 mL IVPB 3 g 100 mL/hr   08/02/13 1819 Given   Ampicillin-Sulbactam (UNASYN) 3 g in sodium chloride 0.9 % 100 mL IVPB 3 g 100 mL/hr   08/03/13 0254 Given   Ampicillin-Sulbactam (UNASYN) 3 g in sodium chloride 0.9 % 100 mL IVPB 3 g 100 mL/hr      HPI/Subjective: - Continues to feel well  Objective: Filed Vitals:   08/02/13 1822 08/02/13 2059 08/03/13 0557 08/03/13 0611  BP: 147/62 156/60 175/64 162/74  Pulse:  57 57 62  Temp:  98.2 F (36.8 C) 98.3 F (36.8 C)   TempSrc:  Oral Oral   Resp:  20 20   Height:      Weight:      SpO2:  100% 100%     Intake/Output Summary (Last 24 hours) at 08/03/13 0835 Last data filed at 08/03/13 0559  Gross  per 24 hour  Intake 5098.75 ml  Output   2250 ml  Net 2848.75 ml   Filed Weights   07/31/13 0922 07/31/13 1911  Weight: 87.091 kg (192 lb) 88 kg (194 lb 0.1 oz)    Exam:  General:  NAD  Cardiovascular: regular rate and rhythm, without MRG  Respiratory: good air movement, clear to auscultation throughout, no wheezing, ronchi or rales  Abdomen: soft, not tender to palpation, positive bowel sounds  MSK: no peripheral edema  Neuro: CN 2-12 grossly intact, MS 5/5 in all 4  Data Reviewed: Basic Metabolic Panel:  Recent Labs Lab 07/31/13 1003 08/01/13 0615 08/02/13 0616  NA 134* 139 138  K 5.2* 4.4 4.6  CL 97 102 103  CO2 27 28 27   GLUCOSE 255* 153* 155*   BUN 27* 20 14  CREATININE 1.32 1.33 1.22  CALCIUM 9.2 8.9 8.9  MG  --  1.8  --   PHOS  --  2.6  --    Liver Function Tests:  Recent Labs Lab 07/31/13 1003 08/01/13 0615 08/02/13 0616 08/03/13 0531  AST 298* 158* 143* 189*  ALT 314* 230* 197* 229*  ALKPHOS 574* 494* 559* 634*  BILITOT 2.8* 3.8* 3.6* 3.1*  PROT 6.8 6.0 6.1 6.2  ALBUMIN 3.5 3.0* 2.9* 2.9*    Recent Labs Lab 07/31/13 1003 08/01/13 1055 08/02/13 0616  LIPASE 211* 35  --   AMYLASE  --   --  39   CBC:  Recent Labs Lab 07/31/13 1003 08/01/13 0615 08/02/13 0616  WBC 11.6* 7.9 5.2  NEUTROABS 10.8*  --   --   HGB 11.6* 10.5* 10.5*  HCT 33.9* 31.0* 31.4*  MCV 90.6 91.4 92.4  PLT 189 184 182   CBG:  Recent Labs Lab 08/02/13 0724 08/02/13 1152 08/02/13 1654 08/02/13 2102 08/03/13 0710  GLUCAP 146* 174* 258* 218* 143*   Studies: No results found.  Scheduled Meds: . ampicillin-sulbactam (UNASYN) IV  3 g Intravenous Q8H  . aspirin  325 mg Oral Daily  . atorvastatin  40 mg Oral Daily  . carvedilol  12.5 mg Oral BID WC  . chlorhexidine  15 mL Mouth/Throat BID  . famotidine  20 mg Oral BID  . heparin  5,000 Units Subcutaneous Q8H  . insulin aspart  0-9 Units Subcutaneous TID WC  . lisinopril  5 mg Oral Daily  . multivitamin with minerals  1 tablet Oral Daily  . sodium chloride  3 mL Intravenous Q12H  . sodium polystyrene  15 g Oral Once   Continuous Infusions: . sodium chloride 75 mL/hr at 08/03/13 1610    Active Problems:   CAD (coronary artery disease)   Hypertension   Pancreatitis   Hyperkalemia   Diabetes mellitus, type 2   Elevated liver enzymes  Time spent: 25  Pamella Pert, MD [facility] Pager [phone]. If 7 PM - 7 AM, please contact night-coverage at [email or web address], password [facility] 08/03/2013, 8:35 AM  LOS: 3 days

## 2013-08-03 NOTE — Care Management Note (Unsigned)
    Page 1 of 1   08/03/2013     2:15:52 PM   CARE MANAGEMENT NOTE 08/03/2013  Patient:  Grant [name], Grant [name]   Account Number:  [id number]  Date Initiated:  08/03/2013  Documentation initiated by:  Anibal Henderson  Subjective/Objective Assessment:   admitted with pancreatitis. Pt lives at home with spouse, and is independent at home. He will be returning home at D/C.     Action/Plan:   No CM needs noted at present. Will continue to follow   Anticipated DC Date:  08/04/2013   Anticipated DC Plan:  HOME/SELF CARE      DC Planning Services  CM consult      Choice offered to / List presented to:             Status of service:  In process, will continue to follow Medicare Important Message given?   (If response is "NO", the following Medicare IM given date fields will be blank) Date Medicare IM given:   Date Additional Medicare IM given:    Discharge Disposition:    Per UR Regulation:  Reviewed for med. necessity/level of care/duration of stay  If discussed at Long Length of Stay Meetings, dates discussed:    Comments:  08/03/13 1100 Anibal Henderson RN/CM

## 2013-08-03 NOTE — Assessment & Plan Note (Signed)
Fair control.

## 2013-08-03 NOTE — Assessment & Plan Note (Signed)
Asymptomatic. Risk factor control is most important part of therapy at this point

## 2013-08-03 NOTE — Progress Notes (Signed)
Patient ID: Grant [name], male   DOB: 11/09/1934, 79 y.o.   MRN: [id number]     Reason for office visit CAD follow up  In the year that has passed since his last appointment Grant [name] has not had any new cardiac problems. Unfortunately, he has worsening vision due to dry macular degeneration. He can no longer drive a car. His diabetes control is fair (followed by Dr.Nida) with a recent hemoglobin A1c of 7%. I do not have his most recent lipid profile but his LDL cholesterol was around 100 on the same medication last year. His blood pressure is normal.   Allergies  Allergen Reactions  . Amiodarone Nausea Only    No current facility-administered medications for this visit.   No current outpatient prescriptions on file.   Facility-Administered Medications Ordered in Other Visits  Medication Dose Route Frequency Provider Last Rate Last Dose  . 0.9 %  sodium chloride infusion   Intravenous Continuous Malissa Hippo, MD 75 mL/hr at 08/03/13 1648    . Ampicillin-Sulbactam (UNASYN) 3 g in sodium chloride 0.9 % 100 mL IVPB  3 g Intravenous Q8H Costin Gherghe, MD   3 g at 08/03/13 1800  . aspirin tablet 325 mg  325 mg Oral Daily Pamella Pert, MD   325 mg at 08/03/13 0900  . atorvastatin (LIPITOR) tablet 40 mg  40 mg Oral Daily Pamella Pert, MD   40 mg at 08/03/13 0900  . carvedilol (COREG) tablet 12.5 mg  12.5 mg Oral BID WC Pamella Pert, MD   12.5 mg at 08/03/13 1653  . chlorhexidine (PERIDEX) 0.12 % solution 15 mL  15 mL Mouth/Throat BID Pamella Pert, MD   15 mL at 08/03/13 1949  . famotidine (PEPCID) tablet 20 mg  20 mg Oral BID Pamella Pert, MD   20 mg at 08/03/13 2129  . heparin injection 5,000 Units  5,000 Units Subcutaneous Q8H Pamella Pert, MD   5,000 Units at 08/03/13 2129  . insulin aspart (novoLOG) injection 0-9 Units  0-9 Units Subcutaneous TID WC Pamella Pert, MD   5 Units at 08/03/13 1648  . lisinopril (PRINIVIL,ZESTRIL) tablet 5 mg  5 mg Oral Daily Pamella Pert, MD   5 mg at 08/03/13 0900  . multivitamin with minerals tablet 1 tablet  1 tablet Oral Daily Pamella Pert, MD   1 tablet at 08/03/13 0900  . ondansetron (ZOFRAN) tablet 4 mg  4 mg Oral Q6H PRN Pamella Pert, MD       Or  . ondansetron (ZOFRAN) injection 4 mg  4 mg Intravenous Q6H PRN Pamella Pert, MD      . oxyCODONE (Oxy IR/ROXICODONE) immediate release tablet 5 mg  5 mg Oral Q6H PRN Costin Gherghe, MD      . sodium chloride 0.9 % injection 3 mL  3 mL Intravenous Q12H Pamella Pert, MD   3 mL at 08/01/13 2200  . sodium polystyrene (KAYEXALATE) 15 GM/60ML suspension 15 g  15 g Oral Once Pamella Pert, MD        Past Medical History  Diagnosis Date  . Macular degeneration   . Diabetes mellitus   . Hypertension   . GERD (gastroesophageal reflux disease)   . Gout   . Arthritis   . Cancer     skin cancer of forehead  . Hyperlipidemia   . Erectile dysfunction   . RBBB   . CAD (coronary artery disease)     Past Surgical History  Procedure Laterality Date  .  Coronary artery bypass graft  02-28-2011    LIMA to LAD,SVG to acute marginal branch of RCA & sequential SVG to OM1 & OM2.  . Pilonidal cyst excision    . Colonoscopy  02/25/2012    Procedure: COLONOSCOPY;  Surgeon: Corbin Ade, MD;  Location: [facility];  Service: Endoscopy;  Laterality: N/A;  7:30 AM  . US echocardiography  11/09/2008    mildly impaired diastolic dysfunction - Grade I  . Nm myocar perf wall motion  02/15/2011    High Risk  . Cardiac catheterization  02/19/2011    3 vessel CAD    Family History  Problem Relation Age of Onset  . Anesthesia problems Neg Hx   . Hypotension Neg Hx   . Malignant hyperthermia Neg Hx   . Pseudochol deficiency Neg Hx   . Stroke Father   . Heart failure Brother   . Cancer Sister   . Hypertension Sister   . Heart failure Sister     History   Social History  . Marital Status: Married    Spouse Name: N/A    Number of Children: N/A  . Years of Education:  N/A   Occupational History  . Not on file.   Social History Main Topics  . Smoking status: Former Smoker -- 0.50 packs/day for 25 years    Types: Cigarettes    Quit date: 02/25/1983  . Smokeless tobacco: Not on file  . Alcohol Use: Yes     Comment: occasional  . Drug Use: No  . Sexual Activity: No   Other Topics Concern  . Not on file   Social History Narrative  . No narrative on file    Review of systems: The patient specifically denies any chest pain at rest or with exertion, dyspnea at rest or with exertion, orthopnea, paroxysmal nocturnal dyspnea, syncope, palpitations, focal neurological deficits, intermittent claudication, lower extremity edema, unexplained weight gain, cough, hemoptysis or wheezing.  The patient also denies abdominal pain, nausea, vomiting, dysphagia, diarrhea, constipation, polyuria, polydipsia, dysuria, hematuria, frequency, urgency, abnormal bleeding or bruising, fever, chills, unexpected weight changes, mood swings, change in skin or hair texture, change in voice quality, auditory or visual problems, allergic reactions or rashes, new musculoskeletal complaints other than usual "aches and pains".   PHYSICAL EXAM BP 120/66  Pulse 56  Ht 6' (1.829 m)  Wt 196 lb 14.4 oz (89.313 kg)  BMI 26.7 kg/m2  General: Alert, oriented x3, no distress Head: no evidence of trauma, PERRL, EOMI, no exophtalmos or lid lag, no myxedema, no xanthelasma; normal ears, nose and oropharynx Neck: normal jugular venous pulsations and no hepatojugular reflux; brisk carotid pulses without delay and no carotid bruits Chest: clear to auscultation, no signs of consolidation by percussion or palpation, normal fremitus, symmetrical and full respiratory excursions; sternotomy scar Cardiovascular: normal position and quality of the apical impulse, regular rhythm, normal first and widely second heart sounds, no murmurs, rubs or gallops Abdomen: no tenderness or distention, no masses by  palpation, no abnormal pulsatility or arterial bruits, normal bowel sounds, no hepatosplenomegaly Extremities: no clubbing, cyanosis or edema; 2+ radial, ulnar and brachial pulses bilaterally; 2+ right femoral, posterior tibial and dorsalis pedis pulses; 2+ left femoral, posterior tibial and dorsalis pedis pulses; no subclavian or femoral bruits Neurological: grossly nonfocal   EKG: Sinus bradycardia, right bundle branch block, left anterior fascicular block, unchanged from previous tracings  Lipid Panel in 2013 total cholesterol 157, triglycerides 107, HDL 36, LDL 100 No results found  for this basename: chol, trig, hdl, cholhdl, vldl, ldlcalc    BMET    Component Value Date/Time   NA 138 08/02/2013 0616   K 4.6 08/02/2013 0616   CL 103 08/02/2013 0616   CO2 27 08/02/2013 0616   GLUCOSE 155* 08/02/2013 0616   BUN 14 08/02/2013 0616   CREATININE 1.22 08/02/2013 0616   CALCIUM 8.9 08/02/2013 0616   GFRNONAA 55* 08/02/2013 0616   GFRAA 64* 08/02/2013 0616     ASSESSMENT AND PLAN CAD (coronary artery disease) Asymptomatic. Risk factor control is most important part of therapy at this point  Diabetes mellitus, type 2 Fair control  Hypertension Mild, well controlled  Hyperlipidemia On high dose statin, fair lipid control  Orders Placed This Encounter  Procedures  . EKG 12-Lead   He'll followup yearly basis. No changes are made his medications today. Grant Silk, MD, [facility] [phone] office [phone] pager

## 2013-08-04 ENCOUNTER — Inpatient Hospital Stay (HOSPITAL_COMMUNITY): Payer: Medicare Other

## 2013-08-04 ENCOUNTER — Encounter (HOSPITAL_COMMUNITY): Payer: Self-pay | Admitting: Radiology

## 2013-08-04 LAB — IRON AND TIBC
Iron: 74 ug/dL (ref 42–135)
Saturation Ratios: 34 % (ref 20–55)
UIBC: 144 ug/dL (ref 125–400)

## 2013-08-04 LAB — COMPREHENSIVE METABOLIC PANEL
Albumin: 2.9 g/dL — ABNORMAL LOW (ref 3.5–5.2)
BUN: 10 mg/dL (ref 6–23)
Creatinine, Ser: 1.36 mg/dL — ABNORMAL HIGH (ref 0.50–1.35)
GFR calc Af Amer: 56 mL/min — ABNORMAL LOW (ref >90)
Total Bilirubin: 1.9 mg/dL — ABNORMAL HIGH (ref 0.3–1.2)
Total Protein: 6.5 g/dL (ref 6.0–8.3)

## 2013-08-04 LAB — GLUCOSE, CAPILLARY
Glucose-Capillary: 135 mg/dL — ABNORMAL HIGH (ref 70–99)
Glucose-Capillary: 305 mg/dL — ABNORMAL HIGH (ref 70–99)

## 2013-08-04 LAB — FERRITIN: Ferritin: 236 ng/mL (ref 22–322)

## 2013-08-04 MED ORDER — MAGNESIUM HYDROXIDE 400 MG/5ML PO SUSP
30.0000 mL | ORAL | Status: AC
  Administered 2013-08-04: 30 mL via ORAL
  Filled 2013-08-04: qty 30

## 2013-08-04 MED ORDER — ALLOPURINOL 100 MG PO TABS
100.0000 mg | ORAL_TABLET | Freq: Every day | ORAL | Status: DC
  Administered 2013-08-04 – 2013-08-05 (×2): 100 mg via ORAL
  Filled 2013-08-04 (×2): qty 1

## 2013-08-04 MED ORDER — MAGNESIUM HYDROXIDE NICU ORAL SYRINGE 400 MG/5 ML: 30.0000 mL | ORAL | Status: DC

## 2013-08-04 MED ORDER — GADOBENATE DIMEGLUMINE 529 MG/ML IV SOLN
18.0000 mL | Freq: Once | INTRAVENOUS | Status: AC | PRN
  Administered 2013-08-04: 18 mL via INTRAVENOUS

## 2013-08-04 NOTE — Progress Notes (Signed)
[facility] PROGRESS NOTE  Grant [name] WUJ:[id number] DOB: 27-Jan-1934 DOA: 07/31/2013 PCP: [name], MD  HPI: KENNAN DETTER is a 79 y.o. male has a past medical history significant for coronary artery disease status post CABG, hypertension, diabetes controlled with oral medications, hyperlipidemia, presents to the emergency department with a chief complaint of nausea and vomiting that started the morning of admission.   Assessment/Plan: Acute pancreatitis - mild lipase elevation and some evidence of gallstones point to the fact that he may have passed a stone which could cause mild pancreatic inflammation.  - appreciate GI input - MRI/MRCP 9/2.   Nausea and vomiting - likely secondary to acute pancreatitis. Supportive treatment.  - improved, tolerating diet Concern for cholangitis given fever  - Unasyn was started in the ED, would continue that for now but his fever might have been related to his pancreatitis. Monitor  Elevated liver enzymes - Mixed cholestatic and hepatocellular injury.  HTN - continue home meds  DM 2 - SSI, sugars with decent control. CAD - continue home meds  DVT prophylaxis - heparin s.q.  Code Status: Full Family Communication: wife at bedside  Disposition Plan: remain inpatient, home when medically ready  Consultants:  Gastroenterology  Procedures:  none  Anti-infectives   Start     Dose/Rate Route Frequency Ordered Stop   07/31/13 1845  Ampicillin-Sulbactam (UNASYN) 3 g in sodium chloride 0.9 % 100 mL IVPB     3 g 100 mL/hr over 60 Minutes Intravenous Every 8 hours 07/31/13 1833     07/31/13 1715  Ampicillin-Sulbactam (UNASYN) 3 g in sodium chloride 0.9 % 100 mL IVPB     3 g 100 mL/hr over 60 Minutes Intravenous  Once 07/31/13 1701 07/31/13 1823     Antibiotics Given (last 72 hours)   Date/Time Action Medication Dose Rate   08/01/13 1050 Given   Ampicillin-Sulbactam (UNASYN) 3 g in sodium chloride 0.9 % 100 mL IVPB 3 g 100  mL/hr   08/01/13 1855 Given   Ampicillin-Sulbactam (UNASYN) 3 g in sodium chloride 0.9 % 100 mL IVPB 3 g 100 mL/hr   08/02/13 0344 Given   Ampicillin-Sulbactam (UNASYN) 3 g in sodium chloride 0.9 % 100 mL IVPB 3 g 100 mL/hr   08/02/13 1042 Given   Ampicillin-Sulbactam (UNASYN) 3 g in sodium chloride 0.9 % 100 mL IVPB 3 g 100 mL/hr   08/02/13 1819 Given   Ampicillin-Sulbactam (UNASYN) 3 g in sodium chloride 0.9 % 100 mL IVPB 3 g 100 mL/hr   08/03/13 0254 Given   Ampicillin-Sulbactam (UNASYN) 3 g in sodium chloride 0.9 % 100 mL IVPB 3 g 100 mL/hr   08/03/13 1045 Given   Ampicillin-Sulbactam (UNASYN) 3 g in sodium chloride 0.9 % 100 mL IVPB 3 g 100 mL/hr   08/03/13 1800 Given   Ampicillin-Sulbactam (UNASYN) 3 g in sodium chloride 0.9 % 100 mL IVPB 3 g 100 mL/hr   08/04/13 0309 Given   Ampicillin-Sulbactam (UNASYN) 3 g in sodium chloride 0.9 % 100 mL IVPB 3 g 100 mL/hr      HPI/Subjective: - Continues to feel well  Objective: Filed Vitals:   08/03/13 0611 08/03/13 1500 08/03/13 2141 08/04/13 0506  BP: 162/74 161/65 173/62 166/73  Pulse: 62 60 58 59  Temp:  98.3 F (36.8 C)  98.3 F (36.8 C)  TempSrc:  Oral  Oral  Resp:  18 18 18   Height:      Weight:      SpO2:  100%  98% 97%    Intake/Output Summary (Last 24 hours) at 08/04/13 0913 Last data filed at 08/04/13 0600  Gross per 24 hour  Intake   2175 ml  Output      0 ml  Net   2175 ml   Filed Weights   07/31/13 0922 07/31/13 1911  Weight: 87.091 kg (192 lb) 88 kg (194 lb 0.1 oz)    Exam:  General:  NAD  Cardiovascular: regular rate and rhythm, without MRG  Respiratory: good air movement, clear to auscultation throughout, no wheezing, ronchi or rales  Abdomen: soft, not tender to palpation, positive bowel sounds  MSK: no peripheral edema  Neuro: CN 2-12 grossly intact, MS 5/5 in all 4  Data Reviewed: Basic Metabolic Panel:  Recent Labs Lab 07/31/13 1003 08/01/13 0615 08/02/13 0616  NA 134* 139 138  K  5.2* 4.4 4.6  CL 97 102 103  CO2 27 28 27   GLUCOSE 255* 153* 155*  BUN 27* 20 14  CREATININE 1.32 1.33 1.22  CALCIUM 9.2 8.9 8.9  MG  --  1.8  --   PHOS  --  2.6  --    Liver Function Tests:  Recent Labs Lab 07/31/13 1003 08/01/13 0615 08/02/13 0616 08/03/13 0531  AST 298* 158* 143* 189*  ALT 314* 230* 197* 229*  ALKPHOS 574* 494* 559* 634*  BILITOT 2.8* 3.8* 3.6* 3.1*  PROT 6.8 6.0 6.1 6.2  ALBUMIN 3.5 3.0* 2.9* 2.9*    Recent Labs Lab 07/31/13 1003 08/01/13 1055 08/02/13 0616  LIPASE 211* 35  --   AMYLASE  --   --  39   CBC:  Recent Labs Lab 07/31/13 1003 08/01/13 0615 08/02/13 0616  WBC 11.6* 7.9 5.2  NEUTROABS 10.8*  --   --   HGB 11.6* 10.5* 10.5*  HCT 33.9* 31.0* 31.4*  MCV 90.6 91.4 92.4  PLT 189 184 182   CBG:  Recent Labs Lab 08/03/13 0710 08/03/13 1200 08/03/13 1642 08/03/13 2054 08/04/13 0740  GLUCAP 143* 255* 261* 216* 179*   Studies: No results found.  Scheduled Meds: . ampicillin-sulbactam (UNASYN) IV  3 g Intravenous Q8H  . aspirin  325 mg Oral Daily  . atorvastatin  40 mg Oral Daily  . carvedilol  12.5 mg Oral BID WC  . chlorhexidine  15 mL Mouth/Throat BID  . famotidine  20 mg Oral BID  . heparin  5,000 Units Subcutaneous Q8H  . insulin aspart  0-9 Units Subcutaneous TID WC  . lisinopril  5 mg Oral Daily  . multivitamin with minerals  1 tablet Oral Daily  . sodium chloride  3 mL Intravenous Q12H  . sodium polystyrene  15 g Oral Once   Continuous Infusions: . sodium chloride 75 mL/hr at 08/03/13 1648    Active Problems:   CAD (coronary artery disease)   Hypertension   Pancreatitis   Hyperkalemia   Diabetes mellitus, type 2   Elevated liver enzymes  Time spent: 25  Pamella Pert, MD [facility] Pager [phone]. If 7 PM - 7 AM, please contact night-coverage at [email or web address], password [facility] 08/04/2013, 9:13 AM  LOS: 4 days

## 2013-08-04 NOTE — Plan of Care (Signed)
Problem: Phase III Progression Outcomes Goal: Activity at appropriate level-compared to baseline (UP IN CHAIR FOR HEMODIALYSIS)  Outcome: Completed/Met Date Met:  08/04/13 08/04/13 1545 Patient ambulates in room with supervision, wife assists at times. Instructed patient to call for assist if any dizziness, weakness, for safety. Stated understood and will call. Earnstine Regal, RN

## 2013-08-04 NOTE — Progress Notes (Signed)
Inpatient Diabetes Program Recommendations  AACE/ADA: New Consensus Statement on Inpatient Glycemic Control (2013)  Target Ranges:  Prepandial:   less than 140 mg/dL      Peak postprandial:   less than 180 mg/dL (1-2 hours)      Critically ill patients:  140 - 180 mg/dL   Results for Grant [name], Grant [name] (MRN [id number]) as of 08/04/2013 10:57  Ref. Range 08/03/2013 07:10 08/03/2013 12:00 08/03/2013 16:42 08/03/2013 20:54 08/04/2013 07:40  Glucose-Capillary Latest Range: 70-99 mg/dL 409 (H) 811 (H) 914 (H) 216 (H) 179 (H)   Inpatient Diabetes Program Recommendations Correction (SSI): Please consider increasing Novolog correction to moderate scale and add bedtime correction. HgbA1C: Please consider ordering an A1C.  Last A1C in the chart was 6.8% on 02/26/2011.  Note: Patient has a history of diabetes and takes Metformin 1000 mg BID at home for diabetes management.  Currently, patient is ordered to receive Novolog 0-9 units AC for inpatient glycemic control.  Blood glucose over the past 24 hours has ranged from 143-261 mg/dl.  Please consider increasing Novolog correction to moderate scale and add bedtime correction.  Also, please order an A1C to determine glycemic control over the past 2-3 months.  Will continue to follow.   Thanks, Orlando Penner, RN, MSN, CCRN Diabetes Coordinator Inpatient Diabetes Program [phone]

## 2013-08-05 ENCOUNTER — Telehealth: Payer: Self-pay | Admitting: Gastroenterology

## 2013-08-05 DIAGNOSIS — K859 Acute pancreatitis without necrosis or infection, unspecified: Secondary | ICD-10-CM

## 2013-08-05 DIAGNOSIS — I10 Essential (primary) hypertension: Secondary | ICD-10-CM

## 2013-08-05 LAB — GLUCOSE, CAPILLARY: Glucose-Capillary: 277 mg/dL — ABNORMAL HIGH (ref 70–99)

## 2013-08-05 LAB — COMPREHENSIVE METABOLIC PANEL
ALT: 190 U/L — ABNORMAL HIGH (ref 0–53)
AST: 139 U/L — ABNORMAL HIGH (ref 0–37)
Albumin: 2.8 g/dL — ABNORMAL LOW (ref 3.5–5.2)
CO2: 25 mEq/L (ref 19–32)
Calcium: 8.9 mg/dL (ref 8.4–10.5)
GFR calc non Af Amer: 54 mL/min — ABNORMAL LOW (ref >90)
Sodium: 137 mEq/L (ref 135–145)

## 2013-08-05 MED ORDER — OXYCODONE HCL 5 MG PO TABS: 5.0000 mg | ORAL_TABLET | Freq: Four times a day (QID) | ORAL | Status: DC | PRN

## 2013-08-05 NOTE — Progress Notes (Signed)
Subjective: Denies abdominal pain, nausea, or vomiting. Has no complaints. Would like to continue care with Dr. Jena Gauss. Surgeon of choice is in [place]: Dr. Luretha Murphy.   Objective: Vital signs in last 24 hours: Temp:  [98.2 F (36.8 C)-99 F (37.2 C)] 99 F (37.2 C) (09/03 0527) Pulse Rate:  [57-64] 64 (09/03 0527) Resp:  [18] 18 (09/03 0527) BP: (138-197)/(60-74) 153/60 mmHg (09/03 0527) SpO2:  [97 %-100 %] 97 % (09/03 0527) Last BM Date: 07/31/13 (pt is ambulating and had prune juice this morning) General:   Alert and oriented, pleasant Head:  Normocephalic and atraumatic. Eyes:  No icterus, sclera clear. Conjuctiva pink.  Heart:  S1, S2 present Lungs: Clear to auscultation bilaterally, without wheezing, rales, or rhonchi.  Abdomen:  Bowel sounds present, soft, non-tender, non-distended. Extremities:  Without clubbing or edema. Neurologic:  Alert and  oriented x4;  grossly normal neurologically.  Intake/Output from previous day: 09/02 0701 - 09/03 0700 In: 2660.5 [P.O.:680; I.V.:1680.5; IV Piggyback:300] Out: 1200 [Urine:1200] Intake/Output this shift:    BMET  Recent Labs  08/04/13 1201 08/05/13 0535  NA 136 137  K 3.9 4.0  CL 100 100  CO2 27 25  GLUCOSE 183* 176*  BUN 10 13  CREATININE 1.36* 1.25  CALCIUM 9.1 8.9   LFT  Recent Labs  08/03/13 0531 08/04/13 1201 08/05/13 0535  PROT 6.2 6.5 6.2  ALBUMIN 2.9* 2.9* 2.8*  AST 189* 147* 139*  ALT 229* 208* 190*  ALKPHOS 634* 677* 624*  BILITOT 3.1* 1.9* 1.5*  BILIDIR 2.4*  --   --   IBILI 0.7  --   --      Studies/Results: Mr 3d Recon At Scanner  08/04/2013   *RADIOLOGY REPORT*  Clinical Data:  Evaluate for gallstone pancreatitis  MRI ABDOMEN WITHOUT AND WITH CONTRAST (INCLUDING MRCP)  Technique:  Multiplanar multisequence MR imaging of the abdomen was performed both before and after the administration of intravenous contrast. Heavily T2-weighted images of the biliary and pancreatic ducts were  obtained, and three-dimensional MRCP images were rendered by post processing.  Contrast: 18mL MULTIHANCE GADOBENATE DIMEGLUMINE 529 MG/ML IV SOLN  Comparison:  CT 07/31/2013  Findings:  There is no suspicious liver abnormality identified. The small stones are noted within the gallbladder lumen measuring up to 7 mm.  No gallbladder wall thickening or pericholecystic fluid identified.  The common bile duct measures up to 5 mm.  No evidence for cold lead no cholelithiasis.  No mass noted.  The pancreatic duct appears within normal limits.  Normal appearance of the pancreas.  Normal appearance of the spleen.  The adrenal glands both appear within normal limits.  Several small cysts are noted within the left kidney.  Normal caliber of the abdominal aorta.  No aneurysm.  No upper abdominal adenopathy identified.  There is no ascites present.  IMPRESSION:  1.  No acute findings identified. 2.  Gallstones. 3.  Normal caliber of the common bile duct.   Original Report Authenticated By: Signa Kell, M.D.   Mr Abd W/wo Cm/mrcp  08/04/2013   *RADIOLOGY REPORT*  Clinical Data:  Evaluate for gallstone pancreatitis  MRI ABDOMEN WITHOUT AND WITH CONTRAST (INCLUDING MRCP)  Technique:  Multiplanar multisequence MR imaging of the abdomen was performed both before and after the administration of intravenous contrast. Heavily T2-weighted images of the biliary and pancreatic ducts were obtained, and three-dimensional MRCP images were rendered by post processing.  Contrast: 18mL MULTIHANCE GADOBENATE DIMEGLUMINE 529 MG/ML IV SOLN  Comparison:  CT 07/31/2013  Findings:  There is no suspicious liver abnormality identified. The small stones are noted within the gallbladder lumen measuring up to 7 mm.  No gallbladder wall thickening or pericholecystic fluid identified.  The common bile duct measures up to 5 mm.  No evidence for cold lead no cholelithiasis.  No mass noted.  The pancreatic duct appears within normal limits.  Normal  appearance of the pancreas.  Normal appearance of the spleen.  The adrenal glands both appear within normal limits.  Several small cysts are noted within the left kidney.  Normal caliber of the abdominal aorta.  No aneurysm.  No upper abdominal adenopathy identified.  There is no ascites present.  IMPRESSION:  1.  No acute findings identified. 2.  Gallstones. 3.  Normal caliber of the common bile duct.   Original Report Authenticated By: Signa Kell, M.D.    Assessment: 79 year old male admitted with biliary pancreatitis, MRCP negative for occult pancreatic malignancy or small CBD stone. LFTs slowly trending down but still quite elevated. Unable to rule out underlying hepatic process. Patient is clinically improved and desires to go home. As of note, normocytic anemia likely of chronic disease, with normal iron studies.   Recommend outpatient cholecystectomy in next several weeks with liver biopsy at time of surgery. Patient clinically stable and anticipate discharge in near future.   Plan: Outpatient appointment with Dr. Luretha Murphy in [facility] Elective outpatient cholecystectomy with liver biopsy for further evaluation Hopeful discharge soon  Nira Retort, ANP-BC [facility] Gastroenterology    LOS: 5 days    08/05/2013, 9:21 AM

## 2013-08-05 NOTE — Progress Notes (Signed)
Pt is to be discharged home today. Pt is in NAD, IV is out, all paperwork has been reviewed/discussed with patient, and there are no questions/concerns at this time. Assessment is unchanged from this morning. Pt is to be accompanied downstairs by staff and family via wheelchair.  

## 2013-08-05 NOTE — Discharge Summary (Signed)
Physician Discharge Summary  Grant [name] ZOX:[id number] DOB: Jun 04, 1934 DOA: 07/31/2013  PCP: [name], MD  Admit date: 07/31/2013 Discharge date: 08/05/2013  Time spent: Greater than 30 minutes  Recommendations for Outpatient Follow-up:  1. Followup with surgery regarding cholecystectomy and liver biopsy.  Discharge Diagnoses:  1. Biliary pancreatitis, clinically resolving. 2. Elevated liver enzymes, possibly multifactorial, from cholelithiasis and/or other liver pathology. 3. Hypertension. 4. Type 2 diabetes mellitus.   Discharge Condition: Stable and improved.  Diet recommendation: Carbohydrate modified diet.  Filed Weights   07/31/13 0922 07/31/13 1911  Weight: 87.091 kg (192 lb) 88 kg (194 lb 0.1 oz)    History of present illness:  This very pleasant 79 year old man presented to the hospital with symptoms of nausea and vomiting. Please see initial history as outlined below: HPI: Grant [name] is a 79 y.o. male has a past medical history significant for coronary artery disease status post CABG, hypertension, diabetes controlled with oral medications, hyperlipidemia, presents to the emergency department with a chief complaint of nausea and vomiting that started this morning. Patient attributes his symptoms to a state that he ate last night. He denies frank fevers, however he endorses chills. He is unable to take anything by mouth. He denies any chest pain, breathing difficulties, cough. He endorses abdominal discomfort, not really pain. He denies diarrhea at home, but after the contrast for the CT scan he had one loose bowel movement. No sick contacts. He denies any lightheadedness or dizziness. In the emergency room, patient was found to have significant elevations of her liver enzymes, fever up to 103 and a lipase elevation to 211. Patient underwent a CT of the abdomen and pelvis that showed no biliary ductal dilatation however shows some gallstones the largest  measuring 4 mm. Right upper quadrant ultrasound was without pericholecystic fluid. His wife endorse that he was a one point told in the past that he has been diagnosed with gallstones.  Hospital Course:  The patient was found to have acute pancreatitis clinically and biochemically. He was treated appropriately with intravenous fluids, n.p.o. and analgesia as required as well as antiemetics. Radiological studies showed presence of gallstones. He underwent MRI of the abdomen which did not show any acute findings except for the gallstones. There was normal caliber of the common bile duct. He was evaluated by gastroenterology, Dr. Kendell Bane, who felt that the patient would require surgical intervention with cholecystectomy. Liver enzymes have been trending down since hospitalization but are still not normal. The rate at which trending down is somewhat slower and there is concern as to whether there is any other liver pathology going on. Dr. Kendell Bane felt that the patient would benefit from a liver biopsy during cholecystectomy. The patient wishes to go home and have surgery done in [place] with Dr. Daphine Deutscher. He is now stable for discharge as he is tolerating a diet without any further nausea vomiting.  Procedures:  None.   Consultations:  Gastroenterology, Dr. Kendell Bane.  Discharge Exam: Filed Vitals:   08/05/13 0527  BP: 153/60  Pulse: 64  Temp: 99 F (37.2 C)  Resp: 18    General: He looks systemically well. Is not toxic or septic. Cardiovascular: Heart sounds are present without murmurs or added sounds. Respiratory:  Lung fields are clear. He is alert and orientated. Abdomen soft and nontender.  Discharge Instructions  Discharge Orders   Future Orders Complete By Expires   Diet - low sodium heart healthy  As directed    Increase activity slowly  As directed        Medication List         allopurinol 100 MG tablet  Commonly known as:  ZYLOPRIM  Take 100 mg by mouth daily.      aspirin 325 MG tablet  Take 325 mg by mouth daily.     atorvastatin 40 MG tablet  Commonly known as:  LIPITOR  Take 40 mg by mouth daily.     carvedilol 12.5 MG tablet  Commonly known as:  COREG  Take 12.5 mg by mouth 2 (two) times daily with a meal.     metFORMIN 500 MG tablet  Commonly known as:  GLUCOPHAGE  Take 1,000 mg by mouth 2 (two) times daily with a meal.     multivitamins with iron Tabs tablet  Take 1 tablet by mouth daily.     naproxen sodium 220 MG tablet  Commonly known as:  ANAPROX  Take 220 mg by mouth daily as needed (headache).     oxyCODONE 5 MG immediate release tablet  Commonly known as:  Oxy IR/ROXICODONE  Take 1 tablet (5 mg total) by mouth every 6 (six) hours as needed.     PRESERVISION AREDS PO  Take 1 capsule by mouth 2 (two) times daily.     quinapril 20 MG tablet  Commonly known as:  ACCUPRIL  Take 20 mg by mouth daily.     ranitidine 150 MG tablet  Commonly known as:  ZANTAC  Take 150 mg by mouth 2 (two) times daily.       Allergies  Allergen Reactions  . Amiodarone Nausea Only       Follow-up Information   Follow up with [facility]. Schedule an appointment as soon as possible for a visit in 1 week.   Contact information:   [address] [place] [address]       Follow up with [name], MD. Schedule an appointment as soon as possible for a visit in 1 week.   Specialty:  Family Medicine   Contact information:   [address] [place] [address] [phone]        The results of significant diagnostics from this hospitalization (including imaging, microbiology, ancillary and laboratory) are listed below for reference.    Significant Diagnostic Studies: Ct Abdomen Pelvis Wo Contrast  07/31/2013   *RADIOLOGY REPORT*  Clinical Data: Abdominal pain, history of diabetes  CT ABDOMEN AND PELVIS WITHOUT CONTRAST  Technique:  Multidetector CT  imaging of the abdomen and pelvis was performed following the standard protocol without intravenous contrast.  Comparison: None.  Findings: Sagittal images of the spine shows diffuse osteopenia. Disc space flattening at L4-L5 and L5 S1 level.  Disc space flattening with vacuum disc phenomenon at T12-L1 level.  Lung bases are unremarkable.  Small hiatal hernia.  Mild thickening of distal esophageal wall probable from gastroesophageal reflux disease.  Atherosclerotic calcifications of the abdominal aorta and the iliac arteries.  Unenhanced liver shows no biliary ductal dilatation.  Small calcified gallstones are noted in gallbladder fundal region the largest measures 4 mm.  Unenhanced pancreas, spleen and adrenal glands are unremarkable. Unenhanced kidneys shows cortical thinning probable due to atrophy. There is irregular contour of the left kidney probable due to cortical scarring.  No nephrolithiasis.  No hydronephrosis or hydroureter.  Bilateral no calcified ureteral calculi are noted.  No aortic aneurysm.  Oral contrast material was given to the patient.  No small bowel obstruction.  There is no pericecal inflammation.  Normal appendix is clearly visualized axial image 57.  Mild distended urinary bladder.  Bilateral distal ureter is unremarkable.  No distal colonic obstruction.  Bilateral inguinal scrotal canal hernia containing fat is noted without evidence of acute complication. Prostate gland calcifications are noted.  The seminal vesicles are unremarkable.  IMPRESSION:  1.  Small hiatal hernia.  Mild thickening of distal esophageal wall probable from gastroesophageal reflux. 2.  Bilateral mild renal cortical thinning probable due to atrophy. Probable cortical scarring left kidney.  No nephrolithiasis.  No hydronephrosis or hydroureter.  No calcified ureteral calculi. 3.  No small bowel or colonic obstruction. 4.  No pericecal inflammation.  Normal appendix is clearly visualized. 5.  Mild distended urinary  bladder.  No calcified calculi are noted within bladder. 6.  Bilateral inguinal scrotal canal hernia containing fat without evidence of acute complication.   Original Report Authenticated By: Natasha Mead, M.D.   US Abdomen Complete  07/31/2013   *RADIOLOGY REPORT*  Clinical Data:  Abdominal pain.  ABDOMINAL ULTRASOUND COMPLETE  Comparison:  None.  Findings:  Gallbladder:  There appears to be a combination of gallstones and sludge within the gallbladder lumen.  Minimal gallbladder wall thickening is noted.  No pericholecystic fluid is noted.  No sonographic Murphy's sign is noted.  Common Bile Duct:  Measures 5.7 mm which is within normal limits in caliber.  Liver: No focal mass lesion identified.  Within normal limits in parenchymal echogenicity.  IVC:  Appears normal.  Pancreas:  Pancreatic head appears normal.  The body and tail are not well visualized due to overlying bowel gas.  Spleen:  Within normal limits in size and echotexture.  Right kidney:  Measures 10.7 cm in length. 12 mm hypoechoic area is seen in the cortex of the upper pole of the right kidney. Normal in size and parenchymal echogenicity.  No evidence of mass or hydronephrosis.  Left kidney:  Measures 12.5 cm in length. Normal in size and parenchymal echogenicity.  No evidence of mass or hydronephrosis.  Abdominal Aorta:  Atheromatous disease is seen involving the visualized abdominal aorta.  IMPRESSION: Probable combination of gallstones and sludge seen in gallbladder lumen with minimal gallbladder wall thickening.  No pericholecystic fluid or sonographic Murphy's sign is noted.  12 mm hypoechoic area is seen in upper pole of right kidney; this may simply represent a cyst, but it is not diagnostic by ultrasound criteria. Further evaluation with CT or MRI scan with contrast is recommended to rule out mass.   Original Report Authenticated By: Lupita Raider.,  M.D.   Mr 3d Recon At Scanner  08/04/2013   *RADIOLOGY REPORT*  Clinical Data:  Evaluate  for gallstone pancreatitis  MRI ABDOMEN WITHOUT AND WITH CONTRAST (INCLUDING MRCP)  Technique:  Multiplanar multisequence MR imaging of the abdomen was performed both before and after the administration of intravenous contrast. Heavily T2-weighted images of the biliary and pancreatic ducts were obtained, and three-dimensional MRCP images were rendered by post processing.  Contrast: 18mL MULTIHANCE GADOBENATE DIMEGLUMINE 529 MG/ML IV SOLN  Comparison:  CT 07/31/2013  Findings:  There is no suspicious liver abnormality identified. The small stones are noted within the gallbladder lumen measuring up to 7 mm.  No gallbladder wall thickening or pericholecystic fluid identified.  The common bile duct measures up to 5 mm.  No evidence for cold lead no cholelithiasis.  No mass noted.  The pancreatic duct appears within normal limits.  Normal appearance of the pancreas.  Normal appearance of the spleen.  The adrenal glands both appear within normal limits.  Several small cysts are noted within the left kidney.  Normal caliber of the abdominal aorta.  No aneurysm.  No upper abdominal adenopathy identified.  There is no ascites present.  IMPRESSION:  1.  No acute findings identified. 2.  Gallstones. 3.  Normal caliber of the common bile duct.   Original Report Authenticated By: Signa Kell, M.D.   Mr Abd W/wo Cm/mrcp  08/04/2013   *RADIOLOGY REPORT*  Clinical Data:  Evaluate for gallstone pancreatitis  MRI ABDOMEN WITHOUT AND WITH CONTRAST (INCLUDING MRCP)  Technique:  Multiplanar multisequence MR imaging of the abdomen was performed both before and after the administration of intravenous contrast. Heavily T2-weighted images of the biliary and pancreatic ducts were obtained, and three-dimensional MRCP images were rendered by post processing.  Contrast: 18mL MULTIHANCE GADOBENATE DIMEGLUMINE 529 MG/ML IV SOLN  Comparison:  CT 07/31/2013  Findings:  There is no suspicious liver abnormality identified. The small stones are  noted within the gallbladder lumen measuring up to 7 mm.  No gallbladder wall thickening or pericholecystic fluid identified.  The common bile duct measures up to 5 mm.  No evidence for cold lead no cholelithiasis.  No mass noted.  The pancreatic duct appears within normal limits.  Normal appearance of the pancreas.  Normal appearance of the spleen.  The adrenal glands both appear within normal limits.  Several small cysts are noted within the left kidney.  Normal caliber of the abdominal aorta.  No aneurysm.  No upper abdominal adenopathy identified.  There is no ascites present.  IMPRESSION:  1.  No acute findings identified. 2.  Gallstones. 3.  Normal caliber of the common bile duct.   Original Report Authenticated By: Signa Kell, M.D.    Microbiology: No results found for this or any previous visit (from the past 240 hour(s)).   Labs: Basic Metabolic Panel:  Recent Labs Lab 07/31/13 1003 08/01/13 0615 08/02/13 0616 08/04/13 1201 08/05/13 0535  NA 134* 139 138 136 137  K 5.2* 4.4 4.6 3.9 4.0  CL 97 102 103 100 100  CO2 27 28 27 27 25   GLUCOSE 255* 153* 155* 183* 176*  BUN 27* 20 14 10 13   CREATININE 1.32 1.33 1.22 1.36* 1.25  CALCIUM 9.2 8.9 8.9 9.1 8.9  MG  --  1.8  --   --   --   PHOS  --  2.6  --   --   --    Liver Function Tests:  Recent Labs Lab 08/01/13 0615 08/02/13 0616 08/03/13 0531 08/04/13 1201 08/05/13 0535  AST 158* 143* 189* 147* 139*  ALT 230* 197* 229* 208* 190*  ALKPHOS 494* 559* 634* 677* 624*  BILITOT 3.8* 3.6* 3.1* 1.9* 1.5*  PROT 6.0 6.1 6.2 6.5 6.2  ALBUMIN 3.0* 2.9* 2.9* 2.9* 2.8*    Recent Labs Lab 07/31/13 1003 08/01/13 1055 08/02/13 0616  LIPASE 211* 35  --   AMYLASE  --   --  39    CBC:  Recent Labs Lab 07/31/13 1003 08/01/13 0615 08/02/13 0616  WBC 11.6* 7.9 5.2  NEUTROABS 10.8*  --   --   HGB 11.6* 10.5* 10.5*  HCT 33.9* 31.0* 31.4*  MCV 90.6 91.4 92.4  PLT 189 184 182     CBG:  Recent Labs Lab 08/04/13 1125  08/04/13 1618 08/04/13 2100 08/05/13 0720 08/05/13 1107  GLUCAP 135* 305* 276*  184* 277*       Signed:  GOSRANI,NIMISH C  [facility] 08/05/2013, 1:16 PM

## 2013-08-05 NOTE — Telephone Encounter (Signed)
Patient has been discharged from [facility] after admission for likely biliary pancreatitis.   He would like evaluation with Dr. Luretha Murphy in [place], [place], for consideration of cholecystectomy.We would also like him to have a liver biopsy at time of surgery.   Please arrange appt in [place] in next 2 weeks or so. Let's also have him repeat LFTs first of next week.

## 2013-08-06 ENCOUNTER — Other Ambulatory Visit: Payer: Self-pay | Admitting: Gastroenterology

## 2013-08-06 ENCOUNTER — Other Ambulatory Visit: Payer: Self-pay

## 2013-08-06 DIAGNOSIS — R748 Abnormal levels of other serum enzymes: Secondary | ICD-10-CM

## 2013-08-06 DIAGNOSIS — K915 Postcholecystectomy syndrome: Secondary | ICD-10-CM

## 2013-08-06 DIAGNOSIS — K859 Acute pancreatitis without necrosis or infection, unspecified: Secondary | ICD-10-CM

## 2013-08-06 NOTE — Telephone Encounter (Signed)
Referral has been made to [facility] to Dr. Luretha Murphy

## 2013-08-06 NOTE — Telephone Encounter (Signed)
Lab order is done and has been mailed to the pt.

## 2013-08-11 ENCOUNTER — Telehealth: Payer: Self-pay | Admitting: General Practice

## 2013-08-11 ENCOUNTER — Emergency Department (HOSPITAL_COMMUNITY)
Admission: EM | Admit: 2013-08-11 | Discharge: 2013-08-11 | Disposition: A | Discharge status: Home / Self Care | Payer: Medicare Other | Attending: Emergency Medicine | Admitting: Emergency Medicine

## 2013-08-11 ENCOUNTER — Encounter (HOSPITAL_COMMUNITY): Payer: Self-pay | Admitting: Emergency Medicine

## 2013-08-11 DIAGNOSIS — K219 Gastro-esophageal reflux disease without esophagitis: Secondary | ICD-10-CM | POA: Insufficient documentation

## 2013-08-11 DIAGNOSIS — E875 Hyperkalemia: Secondary | ICD-10-CM

## 2013-08-11 DIAGNOSIS — M129 Arthropathy, unspecified: Secondary | ICD-10-CM | POA: Diagnosis not present

## 2013-08-11 DIAGNOSIS — Z888 Allergy status to other drugs, medicaments and biological substances status: Secondary | ICD-10-CM | POA: Diagnosis not present

## 2013-08-11 DIAGNOSIS — Z85828 Personal history of other malignant neoplasm of skin: Secondary | ICD-10-CM | POA: Diagnosis not present

## 2013-08-11 DIAGNOSIS — I2510 Atherosclerotic heart disease of native coronary artery without angina pectoris: Secondary | ICD-10-CM | POA: Insufficient documentation

## 2013-08-11 DIAGNOSIS — N529 Male erectile dysfunction, unspecified: Secondary | ICD-10-CM | POA: Insufficient documentation

## 2013-08-11 DIAGNOSIS — I10 Essential (primary) hypertension: Secondary | ICD-10-CM | POA: Insufficient documentation

## 2013-08-11 DIAGNOSIS — Z87891 Personal history of nicotine dependence: Secondary | ICD-10-CM | POA: Diagnosis not present

## 2013-08-11 DIAGNOSIS — J3089 Other allergic rhinitis: Secondary | ICD-10-CM | POA: Diagnosis not present

## 2013-08-11 DIAGNOSIS — I4510 Unspecified right bundle-branch block: Secondary | ICD-10-CM | POA: Diagnosis not present

## 2013-08-11 DIAGNOSIS — K859 Acute pancreatitis without necrosis or infection, unspecified: Secondary | ICD-10-CM | POA: Diagnosis not present

## 2013-08-11 DIAGNOSIS — E785 Hyperlipidemia, unspecified: Secondary | ICD-10-CM | POA: Insufficient documentation

## 2013-08-11 DIAGNOSIS — M109 Gout, unspecified: Secondary | ICD-10-CM | POA: Insufficient documentation

## 2013-08-11 DIAGNOSIS — R748 Abnormal levels of other serum enzymes: Secondary | ICD-10-CM | POA: Diagnosis not present

## 2013-08-11 DIAGNOSIS — Z7982 Long term (current) use of aspirin: Secondary | ICD-10-CM | POA: Diagnosis not present

## 2013-08-11 DIAGNOSIS — F05 Delirium due to known physiological condition: Secondary | ICD-10-CM | POA: Insufficient documentation

## 2013-08-11 DIAGNOSIS — E119 Type 2 diabetes mellitus without complications: Secondary | ICD-10-CM | POA: Diagnosis not present

## 2013-08-11 DIAGNOSIS — H3530 Unspecified macular degeneration: Secondary | ICD-10-CM | POA: Insufficient documentation

## 2013-08-11 DIAGNOSIS — R739 Hyperglycemia, unspecified: Secondary | ICD-10-CM

## 2013-08-11 DIAGNOSIS — Z79899 Other long term (current) drug therapy: Secondary | ICD-10-CM | POA: Insufficient documentation

## 2013-08-11 LAB — URINALYSIS, ROUTINE W REFLEX MICROSCOPIC
Bilirubin Urine: NEGATIVE
Glucose, UA: >1000 mg/dL — AB
Hgb urine dipstick: NEGATIVE
Ketones, ur: NEGATIVE mg/dL
Leukocytes, UA: NEGATIVE
Nitrite: NEGATIVE
Protein, ur: NEGATIVE mg/dL
Specific Gravity, Urine: 1.026 (ref 1.005–1.030)
Urobilinogen, UA: 0.2 mg/dL (ref 0.0–1.0)
pH: 5 (ref 5.0–8.0)

## 2013-08-11 LAB — CBC WITH DIFFERENTIAL/PLATELET
Basophils Absolute: 0 K/uL (ref 0.0–0.1)
Basophils Relative: 0 % (ref 0–1)
Eosinophils Absolute: 0.2 K/uL (ref 0.0–0.7)
Eosinophils Relative: 2 % (ref 0–5)
HCT: 39.2 % (ref 39.0–52.0)
Hemoglobin: 13.5 g/dL (ref 13.0–17.0)
Lymphocytes Relative: 21 % (ref 12–46)
Lymphs Abs: 1.9 10*3/uL (ref 0.7–4.0)
MCH: 31.2 pg (ref 26.0–34.0)
MCHC: 34.4 g/dL (ref 30.0–36.0)
MCV: 90.5 fL (ref 78.0–100.0)
Monocytes Absolute: 0.5 K/uL (ref 0.1–1.0)
Monocytes Relative: 5 % (ref 3–12)
Neutro Abs: 6.7 10*3/uL (ref 1.7–7.7)
Neutrophils Relative %: 72 % (ref 43–77)
Platelets: 281 10*3/uL (ref 150–400)
RBC: 4.33 MIL/uL (ref 4.22–5.81)
RDW: 13.1 % (ref 11.5–15.5)
WBC: 9.3 10*3/uL (ref 4.0–10.5)

## 2013-08-11 LAB — COMPREHENSIVE METABOLIC PANEL
ALT: 94 U/L — ABNORMAL HIGH (ref 0–53)
Alkaline Phosphatase: 422 U/L — ABNORMAL HIGH (ref 39–117)
BUN: 31 mg/dL — ABNORMAL HIGH (ref 6–23)
Chloride: 94 mEq/L — ABNORMAL LOW (ref 96–112)
GFR calc Af Amer: 56 mL/min — ABNORMAL LOW (ref >90)
Glucose, Bld: 427 mg/dL — ABNORMAL HIGH (ref 70–99)
Potassium: 5.6 mEq/L — ABNORMAL HIGH (ref 3.5–5.1)
Sodium: 136 mEq/L (ref 135–145)
Total Bilirubin: 1.1 mg/dL (ref 0.3–1.2)

## 2013-08-11 LAB — HEPATIC FUNCTION PANEL
Albumin: 3.8 g/dL (ref 3.5–5.2)
Indirect Bilirubin: 0.9 mg/dL (ref 0.0–0.9)
Total Protein: 6.5 g/dL (ref 6.0–8.3)

## 2013-08-11 LAB — COMPREHENSIVE METABOLIC PANEL WITH GFR
AST: 37 U/L (ref 0–37)
Albumin: 3.7 g/dL (ref 3.5–5.2)
CO2: 28 meq/L (ref 19–32)
Calcium: 10 mg/dL (ref 8.4–10.5)
Creatinine, Ser: 1.36 mg/dL — ABNORMAL HIGH (ref 0.50–1.35)
GFR calc non Af Amer: 49 mL/min — ABNORMAL LOW (ref >90)
Total Protein: 7.5 g/dL (ref 6.0–8.3)

## 2013-08-11 LAB — URINE MICROSCOPIC-ADD ON

## 2013-08-11 LAB — LIPASE, BLOOD: Lipase: 41 U/L (ref 11–59)

## 2013-08-11 MED ORDER — SODIUM CHLORIDE 0.9 % IV BOLUS (SEPSIS)
1000.0000 mL | Freq: Once | INTRAVENOUS | Status: AC
  Administered 2013-08-11: 1000 mL via INTRAVENOUS

## 2013-08-11 NOTE — ED Notes (Signed)
Pt sent here for increased jaundiced; pt recently admitted for gall bladder issues and supposed to have gall bladder out in 2 weeks; pt PCP noted increased jaundiced and sent pt here for eval

## 2013-08-11 NOTE — ED Notes (Signed)
Patient with a history of cholecystitis and cholelithiasis and elevated LFT's.  States that he went to his MD, Dr. Lovena Neighbours, today and was sent to the ED because of jaundice.  Patient denies pain, N & V.

## 2013-08-11 NOTE — ED Provider Notes (Signed)
CSN: [id number]     Arrival date & time 08/11/13  1428 History   First MD Initiated Contact with Patient 08/11/13 1636     Chief Complaint  Patient presents with  . Jaundice   (Consider location/radiation/quality/duration/timing/severity/associated sxs/prior Treatment) HPI Comments: Pt with recent pancreatitis, gallbaldder and hepatic problems, was admitted,  And recommendations given  on performing outpt cholecystectomy in the next couple of weeks.  He has had MRI and MRCP in the past.  Pt was noted to be more jaundiced while at outpt clinic visit and recommended to come to the ED.  I spoke to the [facility] that pt was at and staff there reports that the pt appeared ill, was seemingly confused and couldn't answer questions properly there.  They were concerned for complications to his diabetes or pancreatitis.  The physician there although he did not see the patient reports they tried to reach his PCP to gain more information, couldn't reach them and so directed them to go to the ED.  Spouse called GI as well and was redirected by the GI office that since the plan was that he needed surgery, to go ahead and simply go to the hospital where the surgery could be performed.  Pt reports he feels fine, no abd pain, no back pain, no fevers.  Pt is at baseline mentation per spouse and that his skin color is normal for him.    The history is provided by the patient, the spouse and medical records.    Past Medical History  Diagnosis Date  . Macular degeneration   . Diabetes mellitus   . Hypertension   . GERD (gastroesophageal reflux disease)   . Gout   . Arthritis   . Cancer     skin cancer of forehead  . Hyperlipidemia   . Erectile dysfunction   . RBBB   . CAD (coronary artery disease)    Past Surgical History  Procedure Laterality Date  . Coronary artery bypass graft  02-28-2011    LIMA to LAD,SVG to acute marginal branch of RCA & sequential SVG to OM1 & OM2.  . Pilonidal cyst  excision    . Colonoscopy  02/25/2012    Procedure: COLONOSCOPY;  Surgeon: Corbin Ade, MD;  Location: [facility];  Service: Endoscopy;  Laterality: N/A;  7:30 AM  . US echocardiography  11/09/2008    mildly impaired diastolic dysfunction - Grade I  . Nm myocar perf wall motion  02/15/2011    High Risk  . Cardiac catheterization  02/19/2011    3 vessel CAD   Family History  Problem Relation Age of Onset  . Anesthesia problems Neg Hx   . Hypotension Neg Hx   . Malignant hyperthermia Neg Hx   . Pseudochol deficiency Neg Hx   . Stroke Father   . Heart failure Brother   . Cancer Sister   . Hypertension Sister   . Heart failure Sister    History  Substance Use Topics  . Smoking status: Former Smoker -- 0.50 packs/day for 25 years    Types: Cigarettes    Quit date: 02/25/1983  . Smokeless tobacco: Not on file  . Alcohol Use: Yes     Comment: occasional    Review of Systems  Constitutional: Negative for fever and chills.  Cardiovascular: Negative for chest pain.  Gastrointestinal: Negative for nausea, vomiting, abdominal pain and diarrhea.  Musculoskeletal: Negative for back pain.  Skin: Negative for color change and pallor.  Neurological:  Negative for dizziness, light-headedness and headaches.  Psychiatric/Behavioral: Negative for confusion.  All other systems reviewed and are negative.    Allergies  Amiodarone  Home Medications   Current Outpatient Rx  Name  Route  Sig  Dispense  Refill  . allopurinol (ZYLOPRIM) 100 MG tablet   Oral   Take 100 mg by mouth daily.         Marland Kitchen aspirin 325 MG tablet   Oral   Take 325 mg by mouth daily.         Marland Kitchen atorvastatin (LIPITOR) 40 MG tablet   Oral   Take 40 mg by mouth daily.         . carvedilol (COREG) 12.5 MG tablet   Oral   Take 12.5 mg by mouth 2 (two) times daily with a meal.         . metFORMIN (GLUCOPHAGE) 500 MG tablet   Oral   Take 1,000 mg by mouth 2 (two) times daily with a meal.          .  Multiple Vitamins-Iron (MULTIVITAMINS WITH IRON) TABS   Oral   Take 1 tablet by mouth daily.         . Multiple Vitamins-Minerals (PRESERVISION AREDS PO)   Oral   Take 1 capsule by mouth 2 (two) times daily.         . naproxen sodium (ANAPROX) 220 MG tablet   Oral   Take 220 mg by mouth daily as needed (headache).         . oxyCODONE (OXY IR/ROXICODONE) 5 MG immediate release tablet   Oral   Take 1 tablet (5 mg total) by mouth every 6 (six) hours as needed.   30 tablet   0   . quinapril (ACCUPRIL) 20 MG tablet   Oral   Take 20 mg by mouth daily.          . ranitidine (ZANTAC) 150 MG tablet   Oral   Take 150 mg by mouth 2 (two) times daily.          BP 135/74  Pulse 73  Temp(Src) 98.2 F (36.8 C) (Oral)  Resp 18  Ht 5\' 11"  (1.803 m)  Wt 183 lb (83.008 kg)  BMI 25.53 kg/m2  SpO2 98% Physical Exam  Nursing note and vitals reviewed. Constitutional: He appears well-developed and well-nourished.  HENT:  Head: Normocephalic and atraumatic.  Eyes: Conjunctivae and EOM are normal. No scleral icterus.  Neck: Normal range of motion. Neck supple.  Cardiovascular: Normal rate, regular rhythm and intact distal pulses.   Pulmonary/Chest: Effort normal. No respiratory distress.  Abdominal: Soft. He exhibits no distension. There is no tenderness. There is no rebound and no guarding.  Musculoskeletal: He exhibits no edema and no tenderness.  Neurological: He is alert. He exhibits normal muscle tone.  Skin: Skin is warm and dry. No rash noted. No erythema. No pallor.  Psychiatric: He has a normal mood and affect.    ED Course  Procedures (including critical care time) Labs Review Labs Reviewed  COMPREHENSIVE METABOLIC PANEL - Abnormal; Notable for the following:    Potassium 5.6 (*)    Chloride 94 (*)    Glucose, Bld 427 (*)    BUN 31 (*)    Creatinine, Ser 1.36 (*)    ALT 94 (*)    Alkaline Phosphatase 422 (*)    GFR calc non Af Amer 49 (*)    GFR calc Af Amer  56 (*)  All other components within normal limits  URINALYSIS, ROUTINE W REFLEX MICROSCOPIC - Abnormal; Notable for the following:    Glucose, UA >1000 (*)    All other components within normal limits  CBC WITH DIFFERENTIAL  LIPASE, BLOOD  URINE MICROSCOPIC-ADD ON   Imaging Review No results found.  RA sat is 98% and I interpret to be adequate.  5:53 PM IVF's given, pt remains alert, in no distress, feels well.  Discussed with family plan.  I spoke to Dr. Donell Beers, surgery, who will give message to Dr. Daphine Deutscher and his RN to contact family to arrange close outpt follow up.    MDM   1. Hyperglycemia   2. Hyperkalemia      Pt has no complaints, appears well to me.  Routine labs taken by protocol and chief complaint.  Bili is 1.1, normal.  Pt doesn't appear jaundiced to me.  Pt has been redirected by 2 outpt clinics to the ED.  However, have found glucose to be elevated, although not in DKA, no anion gap, no symptoms of DKA.  K+ is mildly up at 5.6 with Cr high, but at baseline.  Will give a liter of IVF to hydrate, reduce both glucose and K+ and pt likely can be discharged to home.  Will discuss with [facility] surgery as well and ensure pt has follow up with Dr. Daphine Deutscher.          Gavin Pound. Oletta Lamas, MD 08/11/13 1610

## 2013-08-11 NOTE — Telephone Encounter (Signed)
noted 

## 2013-08-11 NOTE — Discharge Instructions (Signed)
 Hyperglycemia Hyperglycemia occurs when the glucose (sugar) in your blood is too high. Hyperglycemia can happen for many reasons, but it most often happens to people who do not know they have diabetes or are not managing their diabetes properly.  CAUSES  Whether you have diabetes or not, there are other causes of hyperglycemia. Hyperglycemia can occur when you have diabetes, but it can also occur in other situations that you might not be as aware of, such as: Diabetes  If you have diabetes and are having problems controlling your blood glucose, hyperglycemia could occur because of some of the following reasons:  Not following your meal plan.  Not taking your diabetes medications or not taking it properly.  Exercising less or doing less activity than you normally do.  Being sick. Pre-diabetes  This cannot be ignored. Before people develop Type 2 diabetes, they almost always have pre-diabetes. This is when your blood glucose levels are higher than normal, but not yet high enough to be diagnosed as diabetes. Research has shown that some long-term damage to the body, especially the heart and circulatory system, may already be occurring during pre-diabetes. If you take action to manage your blood glucose when you have pre-diabetes, you may delay or prevent Type 2 diabetes from developing. Stress  If you have diabetes, you may be diet controlled or on oral medications or insulin  to control your diabetes. However, you may find that your blood glucose is higher than usual in the hospital whether you have diabetes or not. This is often referred to as stress hyperglycemia. Stress can elevate your blood glucose. This happens because of hormones put out by the body during times of stress. If stress has been the cause of your high blood glucose, it can be followed regularly by your caregiver. That way he/she can make sure your hyperglycemia does not continue to get worse or progress to  diabetes. Steroids  Steroids are medications that act on the infection fighting system (immune system) to block inflammation or infection. One side effect can be a rise in blood glucose. Most people can produce enough extra insulin  to allow for this rise, but for those who cannot, steroids make blood glucose levels go even higher. It is not unusual for steroid treatments to uncover diabetes that is developing. It is not always possible to determine if the hyperglycemia will go away after the steroids are stopped. A special blood test called an A1c is sometimes done to determine if your blood glucose was elevated before the steroids were started. SYMPTOMS  Thirsty.  Frequent urination.  Dry mouth.  Blurred vision.  Tired or fatigue.  Weakness.  Sleepy.  Tingling in feet or leg. DIAGNOSIS  Diagnosis is made by monitoring blood glucose in one or all of the following ways:  A1c test. This is a chemical found in your blood.  Fingerstick blood glucose monitoring.  Laboratory results. TREATMENT  First, knowing the cause of the hyperglycemia is important before the hyperglycemia can be treated. Treatment may include, but is not be limited to:  Education.  Change or adjustment in medications.  Change or adjustment in meal plan.  Treatment for an illness, infection, etc.  More frequent blood glucose monitoring.  Change in exercise plan.  Decreasing or stopping steroids.  Lifestyle changes. HOME CARE INSTRUCTIONS   Test your blood glucose as directed.  Exercise regularly. Your caregiver will give you instructions about exercise. Pre-diabetes or diabetes which comes on with stress is helped by exercising.  Eat wholesome,  balanced meals. Eat often and at regular, fixed times. Your caregiver or nutritionist will give you a meal plan to guide your sugar intake.  Being at an ideal weight is important. If needed, losing as little as 10 to 15 pounds may help improve blood  glucose levels. SEEK MEDICAL CARE IF:   You have questions about medicine, activity, or diet.  You continue to have symptoms (problems such as increased thirst, urination, or weight gain). SEEK IMMEDIATE MEDICAL CARE IF:   You are vomiting or have diarrhea.  Your breath smells fruity.  You are breathing faster or slower.  You are very sleepy or incoherent.  You have numbness, tingling, or pain in your feet or hands.  You have chest pain.  Your symptoms get worse even though you have been following your caregiver's orders.  If you have any other questions or concerns. Document Released: 05/15/2001 Document Revised: 02/11/2012 Document Reviewed: 03/17/2012 [facility] Patient Information 2014 [place], [place].     Hyperkalemia Hyperkalemia is when you have too much potassium in your blood. This can be a life-threatening condition. Potassium is normally removed (excreted) from the body by the kidneys. CAUSES  The potassium level in your body can become too high for the following reasons:  You take in too much potassium. You can do this by:  Using salt substitutes. They contain large amounts of potassium.  Taking potassium supplements from your caregiver. The dose may be too high for you.  Eating foods or taking nutritional products with potassium.  You excrete too little potassium. This can happen if:  Your kidneys are not functioning properly. Kidney (renal) disease is a very common cause of hyperkalemia.  You are taking medicines that lower your excretion of potassium, such as certain diuretic medicines.  You have an adrenal gland disease called Addison's disease.  You have a urinary tract obstruction, such as kidney stones.  You are on treatment to mechanically clean your blood (dialysis) and you skip a treatment.  You release a high amount of potassium from your cells into your blood. You may have a condition that causes potassium to move from your cells to your  bloodstream. This can happen with:  Injury to muscles or other tissues. Most potassium is stored in the muscles.  Severe burns or infections.  Acidic blood plasma (acidosis). Acidosis can result from many diseases, such as uncontrolled diabetes. SYMPTOMS  Usually, there are no symptoms unless the potassium is dangerously high or has risen very quickly. Symptoms may include:  Irregular or very slow heartbeat.  Feeling sick to your stomach (nauseous).  Tiredness (fatigue).  Nerve problems such as tingling of the skin, numbness of the hands or feet, weakness, or paralysis. DIAGNOSIS  A simple blood test can measure the amount of potassium in your body. An electrocardiogram test of the heart can also help make the diagnosis. The heart may beat dangerously fast or slow down and stop beating with severe hyperkalemia.  TREATMENT  Treatment depends on how bad the condition is and on the underlying cause.  If the hyperkalemia is an emergency (causing heart problems or paralysis), many different medicines can be used alone or together to lower the potassium level briefly. This may include an insulin  injection even if you are not diabetic. Emergency dialysis may be needed to remove potassium from the body.  If the hyperkalemia is less severe or dangerous, the underlying cause is treated. This can include taking medicines if needed. Your prescription medicines may be changed. You  may also need to take a medicine to help your body get rid of potassium. You may need to eat a diet low in potassium. HOME CARE INSTRUCTIONS   Take medicines and supplements as directed by your caregiver.  Do not take any over-the-counter medicines, supplements, natural products, herbs, or vitamins without reviewing them with your caregiver. Certain supplements and natural food products can have high amounts of potassium. Other products (such as ibuprofen) can damage weak kidneys and raise your potassium.  You may be  asked to do repeat lab tests. Be sure to follow these directions.  If you have kidney disease, you may need to follow a low potassium diet. SEEK MEDICAL CARE IF:   You notice an irregular or very slow heartbeat.  You feel lightheaded.  You develop weakness that is unusual for you. SEEK IMMEDIATE MEDICAL CARE IF:   You have shortness of breath.  You have chest discomfort.  You pass out (faint). MAKE SURE YOU:   Understand these instructions.  Will watch your condition.  Will get help right away if you are not doing well or get worse. Document Released: 11/09/2002 Document Revised: 02/11/2012 Document Reviewed: 04/26/2011 [facility] Patient Information 2014 [place], [place].

## 2013-08-11 NOTE — Telephone Encounter (Signed)
Patient's wife called and said they were at the allergist and the nurse at there told them the patient was jaundice and they should go to the ER.  Patient called our office and wanted to know if we had the referral to [facility] and should she bring him into our office.  I spoke with [facility] and Raynelle Fanning, Dr. Luvenia Starch nurse and was told to let the patient know he should go to the er.  Wife stated she was going to take him to the er.

## 2013-08-12 NOTE — Progress Notes (Signed)
Quick Note:  Overall LFTs are improving from hospitalization. Recommend lap chole with liver biopsy as already stated.  When is appt with Dr. Daphine Deutscher? ______

## 2013-09-03 ENCOUNTER — Ambulatory Visit (INDEPENDENT_AMBULATORY_CARE_PROVIDER_SITE_OTHER): Payer: Medicare Other | Admitting: Surgery

## 2013-09-03 ENCOUNTER — Encounter (INDEPENDENT_AMBULATORY_CARE_PROVIDER_SITE_OTHER): Payer: Self-pay | Admitting: Surgery

## 2013-09-03 VITALS — BP 98/60 | HR 68 | Temp 96.4°F | Resp 14 | Ht 72.0 in | Wt 182.8 lb

## 2013-09-03 DIAGNOSIS — K8020 Calculus of gallbladder without cholecystitis without obstruction: Secondary | ICD-10-CM | POA: Insufficient documentation

## 2013-09-03 DIAGNOSIS — K4020 Bilateral inguinal hernia, without obstruction or gangrene, not specified as recurrent: Secondary | ICD-10-CM | POA: Insufficient documentation

## 2013-09-03 NOTE — Progress Notes (Signed)
Chief Complaint:  History of gallstone pancreatitis, hospitalized at [facility]  History of Present Illness:  Grant [name] is an 79 y.o. male who [facility] being [facility] care in a fairly unresponsive state and found to have elevated liver function studies as well as hyperamylasemia and pancreatitis. His workup revealed gallstones. After several days in the hospital he was discharged home with recommendation for laparoscopic cholecystectomy and probable liver biopsy find a reason for his elevated liver function studies.  He presents today with his wife and I discussed laparoscopic cholecystectomy with him in detail including complications not limited to common bile duct injury, bile leak, bowel injury. He realizes surgery sometimes has been done open. They would like to go ahead and proceed and we will schedule this.  Past Medical History  Diagnosis Date  . Macular degeneration   . Diabetes mellitus   . Hypertension   . GERD (gastroesophageal reflux disease)   . Gout   . Arthritis   . Cancer     skin cancer of forehead  . Hyperlipidemia   . Erectile dysfunction   . RBBB   . CAD (coronary artery disease)     Past Surgical History  Procedure Laterality Date  . Coronary artery bypass graft  02-28-2011    LIMA to LAD,SVG to acute marginal branch of RCA & sequential SVG to OM1 & OM2.  . Pilonidal cyst excision    . Colonoscopy  02/25/2012    Procedure: COLONOSCOPY;  Surgeon: Corbin Ade, MD;  Location: [facility];  Service: Endoscopy;  Laterality: N/A;  7:30 AM  . US echocardiography  11/09/2008    mildly impaired diastolic dysfunction - Grade I  . Nm myocar perf wall motion  02/15/2011    High Risk  . Cardiac catheterization  02/19/2011    3 vessel CAD    Current Outpatient Prescriptions  Medication Sig Dispense Refill  . allopurinol (ZYLOPRIM) 100 MG tablet Take 100 mg by mouth daily.      Marland Kitchen aspirin 325 MG tablet Take 325 mg by mouth  daily.      Marland Kitchen atorvastatin (LIPITOR) 40 MG tablet Take 40 mg by mouth daily.      . carvedilol (COREG) 12.5 MG tablet Take 12.5 mg by mouth 2 (two) times daily with a meal.      . metFORMIN (GLUCOPHAGE) 500 MG tablet Take 1,000 mg by mouth 2 (two) times daily with a meal.       . Multiple Vitamins-Iron (MULTIVITAMINS WITH IRON) TABS Take 1 tablet by mouth daily.      . Multiple Vitamins-Minerals (PRESERVISION AREDS PO) Take 1 capsule by mouth 2 (two) times daily.      . naproxen sodium (ANAPROX) 220 MG tablet Take 220 mg by mouth daily as needed (headache).      . quinapril (ACCUPRIL) 20 MG tablet Take 20 mg by mouth daily.       . ranitidine (ZANTAC) 150 MG tablet Take 150 mg by mouth 2 (two) times daily.       No current facility-administered medications for this visit.   Amiodarone Family History  Problem Relation Age of Onset  . Anesthesia problems Neg Hx   . Hypotension Neg Hx   . Malignant hyperthermia Neg Hx   . Pseudochol deficiency Neg Hx   . Stroke Father   . Heart failure Brother   . Cancer Sister     pancreas & liver & liver  .  Hypertension Sister   . Cancer Sister     breast  . Heart failure Sister   . Cancer Mother     leukemia   Social History:   reports that he quit smoking about 30 years ago. His smoking use included Cigarettes. He has a 12.5 pack-year smoking history. He has never used smokeless tobacco. He reports that  drinks alcohol. He reports that he does not use illicit drugs.   REVIEW OF SYSTEMS - PERTINENT POSITIVES ONLY: Positive for hoarseness since he left any pin which were made which may be related to reflux which he appears to have a small hiatal hernia noted on his x-ray. He is followed closely by cardiology since his open bypass surgery by Dr. Dorris Fetch 2 years ago. Review of systems is otherwise negative.  He smoked cigarettes up until 1982.  Physical Exam:   Blood pressure 98/60, pulse 68, temperature 96.4 F (35.8 C), temperature source  Temporal, resp. rate 14, height 6' (1.829 m), weight 182 lb 12.8 oz (82.918 kg). Body mass index is 24.79 kg/(m^2).  Gen:  WDWN white male NAD  Neurological: Alert and oriented to person, place, and time. Motor and sensory function is grossly intact  Head: Normocephalic and atraumatic.  Eyes: Conjunctivae are normal. Pupils are equal, round, and reactive to light. No scleral icterus.  Neck: Normal range of motion. Neck supple. No tracheal deviation or thyromegaly present.  Cardiovascular:  SR without murmurs or gallops.  No carotid bruits Respiratory: Effort normal.  No respiratory distress. No chest wall tenderness. Breath sounds normal.  No wheezes, rales or rhonchi.  Abdomen:  No prior surgery. Nontender. MR showed hiatal hernia. Also small bilateral fat-containing inguinal hernias GU: Musculoskeletal: Normal range of motion. Extremities are nontender. No cyanosis, edema or clubbing noted Lymphadenopathy: No cervical, preauricular, postauricular or axillary adenopathy is present Skin: Skin is warm and dry. No rash noted. No diaphoresis. No erythema. No pallor. Pscyh: Normal mood and affect. Behavior is normal. Judgment and thought content normal.   LABORATORY RESULTS: No results found for this or any previous visit (from the past 48 hour(s)).  RADIOLOGY RESULTS: No results found.  Problem List: Patient Active Problem List   Diagnosis Date Noted  . Inguinal hernia, bilateral-containing fat 09/03/2013  . Gallstones 09/03/2013  . Hyperlipidemia 08/03/2013  . CAD (coronary artery disease) 07/31/2013  . Hypertension 07/31/2013  . Pancreatitis 07/31/2013  . Hyperkalemia 07/31/2013  . Diabetes mellitus, type 2 07/31/2013  . Elevated liver enzymes 07/31/2013    Assessment & Plan: Gallstones with history of pancreatitis. Plan laparoscopic cholecystectomy with intraoperative cholangiogram and core liver biopsy.    Matt B. Daphine Deutscher, MD, [facility] [phone] beeper [phone]  09/03/2013 10:19 AM

## 2013-09-03 NOTE — Patient Instructions (Signed)

## 2013-09-08 ENCOUNTER — Encounter (HOSPITAL_COMMUNITY): Payer: Self-pay | Admitting: Pharmacy Technician

## 2013-09-09 ENCOUNTER — Encounter (HOSPITAL_COMMUNITY): Payer: Self-pay

## 2013-09-09 ENCOUNTER — Ambulatory Visit (HOSPITAL_COMMUNITY)
Admission: RE | Admit: 2013-09-09 | Discharge: 2013-09-09 | Disposition: A | Discharge status: Home / Self Care | Payer: Medicare Other | Source: Ambulatory Visit | Attending: Surgery | Admitting: Surgery

## 2013-09-09 ENCOUNTER — Encounter (HOSPITAL_COMMUNITY)
Admission: RE | Admit: 2013-09-09 | Discharge: 2013-09-09 | Disposition: A | Discharge status: Home / Self Care | Payer: Medicare Other | Source: Ambulatory Visit | Attending: Surgery | Admitting: Surgery

## 2013-09-09 DIAGNOSIS — K859 Acute pancreatitis without necrosis or infection, unspecified: Secondary | ICD-10-CM

## 2013-09-09 DIAGNOSIS — Z01812 Encounter for preprocedural laboratory examination: Secondary | ICD-10-CM | POA: Diagnosis not present

## 2013-09-09 DIAGNOSIS — L409 Psoriasis, unspecified: Secondary | ICD-10-CM

## 2013-09-09 DIAGNOSIS — K8020 Calculus of gallbladder without cholecystitis without obstruction: Secondary | ICD-10-CM | POA: Diagnosis not present

## 2013-09-09 DIAGNOSIS — Z01818 Encounter for other preprocedural examination: Secondary | ICD-10-CM | POA: Insufficient documentation

## 2013-09-09 DIAGNOSIS — H3530 Unspecified macular degeneration: Secondary | ICD-10-CM

## 2013-09-09 DIAGNOSIS — I10 Essential (primary) hypertension: Secondary | ICD-10-CM | POA: Insufficient documentation

## 2013-09-09 DIAGNOSIS — Z23 Encounter for immunization: Secondary | ICD-10-CM | POA: Diagnosis not present

## 2013-09-09 HISTORY — DX: Psoriasis, unspecified: L40.9

## 2013-09-09 HISTORY — DX: Acute pancreatitis without necrosis or infection, unspecified: K85.90

## 2013-09-09 HISTORY — DX: Unspecified macular degeneration: H35.30

## 2013-09-09 LAB — CBC
Hemoglobin: 12.3 g/dL — ABNORMAL LOW (ref 13.0–17.0)
MCV: 87.9 fL (ref 78.0–100.0)
Platelets: 189 10*3/uL (ref 150–400)
RBC: 4.04 MIL/uL — ABNORMAL LOW (ref 4.22–5.81)
RDW: 12.7 % (ref 11.5–15.5)
WBC: 8.5 10*3/uL (ref 4.0–10.5)

## 2013-09-09 LAB — COMPREHENSIVE METABOLIC PANEL
ALT: 25 U/L (ref 0–53)
AST: 22 U/L (ref 0–37)
Albumin: 3.5 g/dL (ref 3.5–5.2)
CO2: 28 mEq/L (ref 19–32)
Chloride: 95 mEq/L — ABNORMAL LOW (ref 96–112)
Creatinine, Ser: 1.12 mg/dL (ref 0.50–1.35)
GFR calc non Af Amer: 61 mL/min — ABNORMAL LOW (ref >90)
Potassium: 5.3 mEq/L — ABNORMAL HIGH (ref 3.5–5.1)
Sodium: 133 mEq/L — ABNORMAL LOW (ref 135–145)
Total Bilirubin: 0.8 mg/dL (ref 0.3–1.2)

## 2013-09-09 NOTE — Progress Notes (Signed)
09-09-13 1705 Note labs viewable in Epic.

## 2013-09-09 NOTE — Patient Instructions (Addendum)
20  09/09/2013   Your procedure is scheduled on:  10-13 -2014  Report to [facility] at       0930 AM .  Call this number if you have problems the morning of surgery: [phone]  Or Presurgical Testing [phone](Kenith Trickel)   Use 1 Fleet enema night before.   Do not eat food:After Midnight.  .  Take these medicines the morning of surgery with A SIP OF WATER: Allopurinol. Carvedilol. Ranitidine. No Diabetic meds AM of. No Aspirin x5 days before.   Do not wear jewelry, make-up or nail polish.  Do not wear lotions, powders, or perfumes. You may wear deodorant.  Do not shave 12 hours prior to first CHG shower(legs and under arms).(face and neck okay.)  Do not bring valuables to the hospital.  Contacts, dentures or bridgework,body piercing,  may not be worn into surgery.  Leave suitcase in the car. After surgery it may be brought to your room.  For patients admitted to the hospital, checkout time is 11:00 AM the day of discharge.   Patients discharged the day of surgery will not be allowed to drive home. Must have responsible person with you x 24 hours once discharged.  Name and phone number of your driver:[name] -spouse [phone] cell   Special Instructions: CHG(Chlorhedine 4%-"Hibiclens","Betasept","Aplicare") Shower Use Special Wash: see special instructions.(avoid face and genitals)   Failure to follow these instructions may result in Cancellation of your surgery.   Patient signature_______________________________________________________

## 2013-09-09 NOTE — Pre-Procedure Instructions (Signed)
09-09-13 EKG 9'14 /Stress 8'14. CXR today.

## 2013-09-09 NOTE — Pre-Procedure Instructions (Signed)
09-09-13 1705 Fax to Dr. Ermalene Searing office to note labs viewable in [place].

## 2013-09-11 DIAGNOSIS — E1149 Type 2 diabetes mellitus with other diabetic neurological complication: Secondary | ICD-10-CM | POA: Diagnosis not present

## 2013-09-11 DIAGNOSIS — B351 Tinea unguium: Secondary | ICD-10-CM | POA: Diagnosis not present

## 2013-09-14 ENCOUNTER — Encounter (HOSPITAL_COMMUNITY)
Admission: RE | Disposition: A | Discharge status: Home / Self Care | Payer: Self-pay | Source: Ambulatory Visit | Attending: Surgery

## 2013-09-14 ENCOUNTER — Encounter (HOSPITAL_COMMUNITY): Payer: Self-pay | Admitting: Anesthesiology

## 2013-09-14 ENCOUNTER — Ambulatory Visit (HOSPITAL_COMMUNITY)
Admission: RE | Admit: 2013-09-14 | Discharge: 2013-09-14 | Disposition: A | Discharge status: Home / Self Care | Payer: Medicare Other | Source: Ambulatory Visit | Attending: Surgery | Admitting: Surgery

## 2013-09-14 ENCOUNTER — Encounter (HOSPITAL_COMMUNITY): Payer: Self-pay | Admitting: *Deleted

## 2013-09-14 DIAGNOSIS — K859 Acute pancreatitis without necrosis or infection, unspecified: Secondary | ICD-10-CM | POA: Diagnosis not present

## 2013-09-14 DIAGNOSIS — K8020 Calculus of gallbladder without cholecystitis without obstruction: Secondary | ICD-10-CM | POA: Insufficient documentation

## 2013-09-14 DIAGNOSIS — E119 Type 2 diabetes mellitus without complications: Secondary | ICD-10-CM | POA: Diagnosis not present

## 2013-09-14 LAB — GLUCOSE, CAPILLARY: Glucose-Capillary: 332 mg/dL — ABNORMAL HIGH (ref 70–99)

## 2013-09-14 SURGERY — LAPAROSCOPIC CHOLECYSTECTOMY WITH INTRAOPERATIVE CHOLANGIOGRAM
Anesthesia: General

## 2013-09-14 MED ORDER — HEPARIN SODIUM (PORCINE) 5000 UNIT/ML IJ SOLN
5000.0000 [IU] | Freq: Once | INTRAMUSCULAR | Status: DC
  Filled 2013-09-14: qty 1

## 2013-09-14 MED ORDER — CHLORHEXIDINE GLUCONATE 4 % EX LIQD: 1.0000 "application " | Freq: Once | CUTANEOUS | Status: DC

## 2013-09-14 MED ORDER — DEXTROSE 5 % IV SOLN: 2.0000 g | INTRAVENOUS | Status: DC

## 2013-09-14 NOTE — Progress Notes (Addendum)
Preop, Pt cancelled due to poorly controlled diabetes. Per the patient, he was ordered to double metformin dose by an endocrinologist after his admission in Sept. Given the degree of his hyperglycemia and the timing post pancreatitis Dr. Daphine Deutscher and I believe it is possibly further pancreatic insufficiency due to pancreatic necrosis and that he will likely need insulin therapy. I spoke at length with Dr. Daphine Deutscher, the patient and his friends/family with him about my thoughts regarding his increased perioperative risk in the setting of poorly controlled diabetes. Grant [name] was obviously frustrated with having the surgery cancelled, but he understood that it was likely the safest course to take.

## 2013-09-14 NOTE — Progress Notes (Signed)
Dr Renold Don in to see patient.  Surgery cancelled.  Patient is to see his endocrinologist to get glucose levels under control then surgery will be rescheduled.

## 2013-09-14 NOTE — Progress Notes (Signed)
Dr. Renold Don notified that patient's CBG is 332. He will talk to Dr. Daphine Deutscher

## 2013-09-14 NOTE — Progress Notes (Signed)
Discharged via ambulatory to car with wife. Patient knows to schedule an appointment with endocrinologist ASAP

## 2013-09-15 DIAGNOSIS — Z23 Encounter for immunization: Secondary | ICD-10-CM | POA: Diagnosis not present

## 2013-09-21 DIAGNOSIS — I10 Essential (primary) hypertension: Secondary | ICD-10-CM | POA: Diagnosis not present

## 2013-09-21 DIAGNOSIS — E785 Hyperlipidemia, unspecified: Secondary | ICD-10-CM | POA: Diagnosis not present

## 2013-09-21 DIAGNOSIS — I1310 Hypertensive heart and chronic kidney disease without heart failure, with stage 1 through stage 4 chronic kidney disease, or unspecified chronic kidney disease: Secondary | ICD-10-CM | POA: Diagnosis not present

## 2013-10-02 ENCOUNTER — Ambulatory Visit (INDEPENDENT_AMBULATORY_CARE_PROVIDER_SITE_OTHER): Payer: Medicare Other | Admitting: Surgery

## 2013-10-02 ENCOUNTER — Encounter (INDEPENDENT_AMBULATORY_CARE_PROVIDER_SITE_OTHER): Payer: Self-pay | Admitting: Surgery

## 2013-10-02 VITALS — BP 138/70 | HR 60 | Temp 98.0°F | Resp 16 | Ht 72.0 in | Wt 184.0 lb

## 2013-10-02 DIAGNOSIS — K8020 Calculus of gallbladder without cholecystitis without obstruction: Secondary | ICD-10-CM | POA: Diagnosis not present

## 2013-10-02 DIAGNOSIS — K859 Acute pancreatitis without necrosis or infection, unspecified: Secondary | ICD-10-CM | POA: Diagnosis not present

## 2013-10-02 DIAGNOSIS — E119 Type 2 diabetes mellitus without complications: Secondary | ICD-10-CM

## 2013-10-02 NOTE — Progress Notes (Signed)
Chief Complaint:  History of gallstone pancreatitis, hospitalized at [facility] was previously canceled due to high blood sugar  History of Present Illness:  Grant [name] is an 79 y.o. male who was recently hospitalized at [facility] after being taken care in a fairly unresponsive state and found to have elevated liver function studies as well as hyperamylasemia and pancreatitis. His workup revealed gallstones. After several days in the hospital he was discharged home with recommendation for laparoscopic cholecystectomy and probable liver biopsy find a reason for his elevated liver function studies.  He presents today with his wife and I discussed laparoscopic cholecystectomy with him in detail including complications not limited to common bile duct injury, bile leak, bowel injury. He realizes surgery sometimes has been done open. They would like to go ahead and proceed and we will schedule this. This was scheduled previously but he was found to have a blood sugar over 400 and poor control. He since then been checking his blood sugar daily and its been in the 120-180 range on all the one's I looked at. Negative safe to go ahead and reschedule his surgery.  Past Medical History  Diagnosis Date  . Macular degeneration 09-09-13    bilateral"vision is poor"  . Diabetes mellitus   . Hypertension   . GERD (gastroesophageal reflux disease)   . Gout 09-09-13    last flare 8'14 with gallbladder attack at [facility]  . Arthritis   . Cancer     skin cancer of forehead  . Hyperlipidemia   . Erectile dysfunction   . RBBB   . CAD (coronary artery disease)   . Pancreatitis 10--8-14    8'14 -enzymes improved  . Psoriasis 09-09-13    elbows, knees    Past Surgical History  Procedure Laterality Date  . Coronary artery bypass graft  02-28-2011    LIMA to LAD,SVG to acute marginal branch of RCA & sequential SVG to OM1 & OM2.  . Pilonidal cyst excision    . Colonoscopy  02/25/2012    Procedure:  COLONOSCOPY;  Surgeon: Corbin Ade, MD;  [facility];  Service: Endoscopy;  Laterality: N/A;  7:30 AM polyps removed.  . US echocardiography  11/09/2008    mildly impaired diastolic dysfunction - Grade I  . Nm myocar perf wall motion  02/15/2011    High Risk  . Cardiac catheterization  02/19/2011    3 vessel CAD    Current Outpatient Prescriptions  Medication Sig Dispense Refill  . allopurinol (ZYLOPRIM) 100 MG tablet Take 100 mg by mouth daily.      Marland Kitchen aspirin 325 MG tablet Take 325 mg by mouth daily.      Marland Kitchen atorvastatin (LIPITOR) 40 MG tablet Take 40 mg by mouth every evening.       . Canagliflozin (INVOKANA) 100 MG TABS Take by mouth.      . carvedilol (COREG) 12.5 MG tablet Take 12.5 mg by mouth 2 (two) times daily with a meal.      . metFORMIN (GLUCOPHAGE) 500 MG tablet Take 1,000 mg by mouth 2 (two) times daily with a meal.       . Multiple Vitamins-Iron (MULTIVITAMINS WITH IRON) TABS Take 1 tablet by mouth daily.      . Multiple Vitamins-Minerals (PRESERVISION AREDS PO) Take 1 capsule by mouth 2 (two) times daily.      . naproxen sodium (ANAPROX) 220 MG tablet Take 220 mg by mouth daily as needed (headache).      Marland Kitchen  quinapril (ACCUPRIL) 20 MG tablet Take 20 mg by mouth every morning.       . ranitidine (ZANTAC) 150 MG tablet Take 150 mg by mouth 2 (two) times daily.       No current facility-administered medications for this visit.   Amiodarone Family History  Problem Relation Age of Onset  . Anesthesia problems Neg Hx   . Hypotension Neg Hx   . Malignant hyperthermia Neg Hx   . Pseudochol deficiency Neg Hx   . Stroke Father   . Heart failure Brother   . Cancer Sister     pancreas & liver & liver  . Hypertension Sister   . Cancer Sister     breast  . Heart failure Sister   . Cancer Mother     leukemia   Social History:   reports that he quit smoking about 30 years ago. His smoking use included Cigarettes. He has a 12.5 pack-year smoking history. He has  never used smokeless tobacco. He reports that he drinks alcohol. He reports that he does not use illicit drugs.   REVIEW OF SYSTEMS - PERTINENT POSITIVES ONLY: Positive for hoarseness since he left any pin which were made which may be related to reflux which he appears to have a small hiatal hernia noted on his x-ray. He is followed closely by cardiology since his open bypass surgery by Dr. Dorris Fetch 2 years ago. Review of systems is otherwise negative.  He smoked cigarettes up until 1982.  Physical Exam:   Blood pressure 138/70, pulse 60, temperature 98 F (36.7 C), temperature source Temporal, resp. rate 16, height 6' (1.829 m), weight 184 lb (83.462 kg). Body mass index is 24.95 kg/(m^2).  Gen:  WDWN white male NAD  Neurological: Alert and oriented to person, place, and time. Motor and sensory function is grossly intact  Head: Normocephalic and atraumatic.  Eyes: Conjunctivae are normal. Pupils are equal, round, and reactive to light. No scleral icterus.  Neck: Normal range of motion. Neck supple. No tracheal deviation or thyromegaly present.  Cardiovascular:  SR without murmurs or gallops.  No carotid bruits Respiratory: Effort normal.  No respiratory distress. No chest wall tenderness. Breath sounds normal.  No wheezes, rales or rhonchi.  Abdomen:  No prior surgery. Nontender. MR showed hiatal hernia. Also small bilateral fat-containing inguinal hernias GU: Musculoskeletal: Normal range of motion. Extremities are nontender. No cyanosis, edema or clubbing noted Lymphadenopathy: No cervical, preauricular, postauricular or axillary adenopathy is present Skin: Skin is warm and dry. No rash noted. No diaphoresis. No erythema. No pallor. Pscyh: Normal mood and affect. Behavior is normal. Judgment and thought content normal.   LABORATORY RESULTS: No results found for this or any previous visit (from the past 48 hour(s)).  RADIOLOGY RESULTS: No results found.  Problem List: Patient  Active Problem List   Diagnosis Date Noted  . Inguinal hernia, bilateral-containing fat 09/03/2013  . Gallstones 09/03/2013  . Hyperlipidemia 08/03/2013  . CAD (coronary artery disease) 07/31/2013  . Hypertension 07/31/2013  . Pancreatitis 07/31/2013  . Hyperkalemia 07/31/2013  . Diabetes mellitus, type 2 07/31/2013  . Elevated liver enzymes 07/31/2013    Assessment & Plan: Gallstones with history of pancreatitis. Plan laparoscopic cholecystectomy with intraoperative cholangiogram and core liver biopsy.    Matt B. Daphine Deutscher, MD, [facility] [phone] beeper [phone]  10/02/2013 12:53 PM

## 2013-10-09 DIAGNOSIS — L97509 Non-pressure chronic ulcer of other part of unspecified foot with unspecified severity: Secondary | ICD-10-CM | POA: Diagnosis not present

## 2013-10-16 ENCOUNTER — Encounter (HOSPITAL_COMMUNITY): Payer: Self-pay | Admitting: Pharmacy Technician

## 2013-10-19 ENCOUNTER — Other Ambulatory Visit (HOSPITAL_COMMUNITY): Payer: Self-pay | Admitting: Surgery

## 2013-10-19 DIAGNOSIS — R079 Chest pain, unspecified: Secondary | ICD-10-CM | POA: Diagnosis not present

## 2013-10-19 NOTE — Patient Instructions (Addendum)
20 KIOWA HOLLAR  10/19/2013   Your procedure is scheduled on:  10/27/13  TUESDAY  Report to [facility] at 0845      AM.  Call this number if you have problems the morning of surgery: [phone]     FLEETS ENEMA PER RECTUM NIGHT BEFORE SURGERY  Remember:    TAKE ONE HALF DOSAGE INSULIN NIGHT BEFORE SURGERY DO NOT TAKE ANY BLOOD SUGAR MEDICATION MORNING OF SURGERY  Do not eat food  Or drink :After Midnight. Monday NIGHT   Take these medicines the morning of surgery with A SIP OF WATER: CARVEDILOL, RANITIDINE   .  Contacts, dentures or partial plates can not be worn to surgery  Leave suitcase in the car. After surgery it may be brought to your room.  For patients admitted to the hospital, checkout time is 11:00 AM day of  discharge.             SPECIAL INSTRUCTIONS- SEE [place] PREPARING FOR SURGERY INSTRUCTION SHEET-     DO NOT WEAR JEWELRY, LOTIONS, POWDERS, OR PERFUMES.  WOMEN-- DO NOT SHAVE LEGS OR UNDERARMS FOR 12 HOURS BEFORE SHOWERS. MEN MAY SHAVE FACE.  Patients discharged the day of surgery will not be allowed to drive home. IF going home the day of surgery, you must have a driver and someone to stay with you for the first 24 hours  Name and phone number of your driver:        Darral Dash   wife                                                                  Deliah Goody  PST [phone]                                       If Endocrinologist  STOPS  Insulin after office visit on Friday, YOU WILL NOT TAKE ANY INSULIN THE NIGHT BEFORE SURGERY as instructed above                FAILURE TO FOLLOW THESE INSTRUCTIONS MAY RESULT IN  CANCELLATION   OF YOUR SURGERY                                                  Patient Signature _____________________________

## 2013-10-19 NOTE — Progress Notes (Signed)
Chest x ray 10/14, EKG 9/14, OV Dr Royann Shivers  EPIC

## 2013-10-20 ENCOUNTER — Encounter (HOSPITAL_COMMUNITY): Payer: Self-pay

## 2013-10-20 ENCOUNTER — Encounter (HOSPITAL_COMMUNITY)
Admission: RE | Admit: 2013-10-20 | Discharge: 2013-10-20 | Disposition: A | Discharge status: Home / Self Care | Payer: Medicare Other | Source: Ambulatory Visit | Attending: Surgery | Admitting: Surgery

## 2013-10-20 DIAGNOSIS — Z01818 Encounter for other preprocedural examination: Secondary | ICD-10-CM | POA: Diagnosis not present

## 2013-10-20 DIAGNOSIS — Z01812 Encounter for preprocedural laboratory examination: Secondary | ICD-10-CM | POA: Diagnosis not present

## 2013-10-20 LAB — BASIC METABOLIC PANEL
Calcium: 9.7 mg/dL (ref 8.4–10.5)
Chloride: 103 mEq/L (ref 96–112)
GFR calc Af Amer: 61 mL/min — ABNORMAL LOW (ref >90)
Potassium: 5 mEq/L (ref 3.5–5.1)
Sodium: 138 mEq/L (ref 135–145)

## 2013-10-20 LAB — CBC
HCT: 37.9 % — ABNORMAL LOW (ref 39.0–52.0)
Hemoglobin: 12.6 g/dL — ABNORMAL LOW (ref 13.0–17.0)
MCHC: 33.2 g/dL (ref 30.0–36.0)
Platelets: 189 10*3/uL (ref 150–400)
RBC: 4.13 MIL/uL — ABNORMAL LOW (ref 4.22–5.81)
RDW: 14.1 % (ref 11.5–15.5)
WBC: 6.9 10*3/uL (ref 4.0–10.5)

## 2013-10-20 NOTE — Progress Notes (Signed)
Per patient and wife at PST visit-  Has appt Endocrinologist Dr Loleta Books on 10/23/13.  States MD hopes to discontinue Insulin usage.  Instructed with patient and wife that IF INSULIN IS DISCONTINUED,  DO NOT GIVE HALF DOSAGE Monday NIGHT AS DIRECTED ON INSTRUCTIONS--- ONLY GIVE Monday NIGHT IF PT REMAINS ON INSULIN.  BOTH VERBALIZED UNDERSTANDING AND REPEATED THIS TO ME

## 2013-10-23 DIAGNOSIS — I10 Essential (primary) hypertension: Secondary | ICD-10-CM | POA: Diagnosis not present

## 2013-10-23 DIAGNOSIS — E785 Hyperlipidemia, unspecified: Secondary | ICD-10-CM | POA: Diagnosis not present

## 2013-10-27 ENCOUNTER — Ambulatory Visit (HOSPITAL_COMMUNITY): Payer: Medicare Other | Admitting: Anesthesiology

## 2013-10-27 ENCOUNTER — Observation Stay (HOSPITAL_COMMUNITY)
Admission: RE | Admit: 2013-10-27 | Discharge: 2013-10-28 | Disposition: A | Discharge status: Home / Self Care | Payer: Medicare Other | Source: Ambulatory Visit | Attending: Surgery | Admitting: Surgery

## 2013-10-27 ENCOUNTER — Encounter (HOSPITAL_COMMUNITY): Payer: Medicare Other | Admitting: Anesthesiology

## 2013-10-27 ENCOUNTER — Encounter (HOSPITAL_COMMUNITY): Payer: Self-pay | Admitting: *Deleted

## 2013-10-27 ENCOUNTER — Encounter (HOSPITAL_COMMUNITY)
Admission: RE | Disposition: A | Discharge status: Home / Self Care | Payer: Self-pay | Source: Ambulatory Visit | Attending: Surgery

## 2013-10-27 ENCOUNTER — Ambulatory Visit (HOSPITAL_COMMUNITY): Payer: Medicare Other

## 2013-10-27 DIAGNOSIS — K219 Gastro-esophageal reflux disease without esophagitis: Secondary | ICD-10-CM | POA: Insufficient documentation

## 2013-10-27 DIAGNOSIS — E785 Hyperlipidemia, unspecified: Secondary | ICD-10-CM | POA: Diagnosis not present

## 2013-10-27 DIAGNOSIS — K8020 Calculus of gallbladder without cholecystitis without obstruction: Secondary | ICD-10-CM | POA: Diagnosis not present

## 2013-10-27 DIAGNOSIS — Z7982 Long term (current) use of aspirin: Secondary | ICD-10-CM | POA: Insufficient documentation

## 2013-10-27 DIAGNOSIS — K739 Chronic hepatitis, unspecified: Secondary | ICD-10-CM | POA: Diagnosis not present

## 2013-10-27 DIAGNOSIS — R490 Dysphonia: Secondary | ICD-10-CM | POA: Diagnosis not present

## 2013-10-27 DIAGNOSIS — Z87891 Personal history of nicotine dependence: Secondary | ICD-10-CM | POA: Diagnosis not present

## 2013-10-27 DIAGNOSIS — I10 Essential (primary) hypertension: Secondary | ICD-10-CM | POA: Insufficient documentation

## 2013-10-27 DIAGNOSIS — K811 Chronic cholecystitis: Secondary | ICD-10-CM | POA: Diagnosis not present

## 2013-10-27 DIAGNOSIS — I2510 Atherosclerotic heart disease of native coronary artery without angina pectoris: Secondary | ICD-10-CM | POA: Insufficient documentation

## 2013-10-27 DIAGNOSIS — R945 Abnormal results of liver function studies: Secondary | ICD-10-CM | POA: Insufficient documentation

## 2013-10-27 DIAGNOSIS — E119 Type 2 diabetes mellitus without complications: Secondary | ICD-10-CM | POA: Insufficient documentation

## 2013-10-27 DIAGNOSIS — Z951 Presence of aortocoronary bypass graft: Secondary | ICD-10-CM | POA: Diagnosis not present

## 2013-10-27 DIAGNOSIS — Z79899 Other long term (current) drug therapy: Secondary | ICD-10-CM | POA: Diagnosis not present

## 2013-10-27 DIAGNOSIS — K8010 Calculus of gallbladder with chronic cholecystitis without obstruction: Secondary | ICD-10-CM

## 2013-10-27 HISTORY — PX: CHOLECYSTECTOMY: SHX55

## 2013-10-27 LAB — GLUCOSE, CAPILLARY
Glucose-Capillary: 76 mg/dL (ref 70–99)
Glucose-Capillary: 63 mg/dL — ABNORMAL LOW (ref 70–99)
Glucose-Capillary: 113 mg/dL — ABNORMAL HIGH (ref 70–99)
Glucose-Capillary: 106 mg/dL — ABNORMAL HIGH (ref 70–99)
Glucose-Capillary: 131 mg/dL — ABNORMAL HIGH (ref 70–99)

## 2013-10-27 LAB — CBC
Hemoglobin: 11.7 g/dL — ABNORMAL LOW (ref 13.0–17.0)
MCHC: 33.6 g/dL (ref 30.0–36.0)
RDW: 13.9 % (ref 11.5–15.5)
WBC: 8.7 10*3/uL (ref 4.0–10.5)

## 2013-10-27 LAB — HEMOGLOBIN A1C
Hgb A1c MFr Bld: 8.8 % — ABNORMAL HIGH (ref <5.7)
Mean Plasma Glucose: 206 mg/dL — ABNORMAL HIGH (ref <117)

## 2013-10-27 LAB — CREATININE, SERUM
Creatinine, Ser: 1.25 mg/dL (ref 0.50–1.35)
GFR calc Af Amer: 62 mL/min — ABNORMAL LOW (ref >90)
GFR calc non Af Amer: 54 mL/min — ABNORMAL LOW (ref >90)

## 2013-10-27 SURGERY — LAPAROSCOPIC CHOLECYSTECTOMY WITH INTRAOPERATIVE CHOLANGIOGRAM
Anesthesia: General | Site: Abdomen | Wound class: Contaminated

## 2013-10-27 MED ORDER — CEFAZOLIN SODIUM-DEXTROSE 2-3 GM-% IV SOLR
2.0000 g | INTRAVENOUS | Status: AC
  Administered 2013-10-27: 2 g via INTRAVENOUS

## 2013-10-27 MED ORDER — ONDANSETRON HCL 4 MG PO TABS
4.0000 mg | ORAL_TABLET | Freq: Four times a day (QID) | ORAL | Status: DC | PRN
  Filled 2013-10-27: qty 1

## 2013-10-27 MED ORDER — PROPOFOL 10 MG/ML IV BOLUS
INTRAVENOUS | Status: AC
  Filled 2013-10-27: qty 20

## 2013-10-27 MED ORDER — HYDROMORPHONE HCL PF 1 MG/ML IJ SOLN
0.2500 mg | INTRAMUSCULAR | Status: DC | PRN
  Administered 2013-10-27 (×2): 0.25 mg via INTRAVENOUS
  Administered 2013-10-27: 0.5 mg via INTRAVENOUS

## 2013-10-27 MED ORDER — 0.9 % SODIUM CHLORIDE (POUR BTL) OPTIME
TOPICAL | Status: DC | PRN
  Administered 2013-10-27: 1000 mL

## 2013-10-27 MED ORDER — MORPHINE SULFATE 2 MG/ML IJ SOLN
INTRAMUSCULAR | Status: AC
  Filled 2013-10-27: qty 1

## 2013-10-27 MED ORDER — CISATRACURIUM BESYLATE (PF) 10 MG/5ML IV SOLN
INTRAVENOUS | Status: DC | PRN
  Administered 2013-10-27: 6 mg via INTRAVENOUS

## 2013-10-27 MED ORDER — GLYCOPYRROLATE 0.2 MG/ML IJ SOLN
INTRAMUSCULAR | Status: DC | PRN
  Administered 2013-10-27: 0.6 mg via INTRAVENOUS

## 2013-10-27 MED ORDER — NEOSTIGMINE METHYLSULFATE 1 MG/ML IJ SOLN
INTRAMUSCULAR | Status: AC
  Filled 2013-10-27: qty 10

## 2013-10-27 MED ORDER — FENTANYL CITRATE 0.05 MG/ML IJ SOLN
INTRAMUSCULAR | Status: DC | PRN
  Administered 2013-10-27 (×5): 50 ug via INTRAVENOUS

## 2013-10-27 MED ORDER — ONDANSETRON HCL 4 MG/2ML IJ SOLN
INTRAMUSCULAR | Status: DC | PRN
  Administered 2013-10-27: 4 mg via INTRAVENOUS

## 2013-10-27 MED ORDER — HEPARIN SODIUM (PORCINE) 5000 UNIT/ML IJ SOLN
5000.0000 [IU] | Freq: Once | INTRAMUSCULAR | Status: AC
  Administered 2013-10-27: 5000 [IU] via SUBCUTANEOUS
  Filled 2013-10-27: qty 1

## 2013-10-27 MED ORDER — LACTATED RINGERS IV SOLN
INTRAVENOUS | Status: DC
  Administered 2013-10-27: 1000 mL via INTRAVENOUS

## 2013-10-27 MED ORDER — PROPOFOL 10 MG/ML IV BOLUS
INTRAVENOUS | Status: DC | PRN
  Administered 2013-10-27: 50 mg via INTRAVENOUS
  Administered 2013-10-27: 100 mg via INTRAVENOUS

## 2013-10-27 MED ORDER — CEFAZOLIN SODIUM-DEXTROSE 2-3 GM-% IV SOLR
INTRAVENOUS | Status: AC
  Filled 2013-10-27: qty 50

## 2013-10-27 MED ORDER — GLYCOPYRROLATE 0.2 MG/ML IJ SOLN
INTRAMUSCULAR | Status: AC
  Filled 2013-10-27: qty 3

## 2013-10-27 MED ORDER — ALLOPURINOL 100 MG PO TABS
100.0000 mg | ORAL_TABLET | Freq: Every day | ORAL | Status: DC
  Administered 2013-10-28: 100 mg via ORAL
  Filled 2013-10-27: qty 1

## 2013-10-27 MED ORDER — NEOSTIGMINE METHYLSULFATE 1 MG/ML IJ SOLN
INTRAMUSCULAR | Status: DC | PRN
  Administered 2013-10-27: 4 mg via INTRAVENOUS

## 2013-10-27 MED ORDER — OXYCODONE HCL 5 MG/5ML PO SOLN
5.0000 mg | Freq: Once | ORAL | Status: DC | PRN
  Filled 2013-10-27: qty 5

## 2013-10-27 MED ORDER — HEPARIN SODIUM (PORCINE) 5000 UNIT/ML IJ SOLN
5000.0000 [IU] | Freq: Three times a day (TID) | INTRAMUSCULAR | Status: DC
  Administered 2013-10-27 – 2013-10-28 (×2): 5000 [IU] via SUBCUTANEOUS
  Filled 2013-10-27 (×5): qty 1

## 2013-10-27 MED ORDER — MEPERIDINE HCL 50 MG/ML IJ SOLN: 6.2500 mg | INTRAMUSCULAR | Status: DC | PRN

## 2013-10-27 MED ORDER — BUPIVACAINE-EPINEPHRINE 0.25% -1:200000 IJ SOLN
INTRAMUSCULAR | Status: DC | PRN
  Administered 2013-10-27: 30 mL

## 2013-10-27 MED ORDER — HYDROMORPHONE HCL PF 1 MG/ML IJ SOLN
INTRAMUSCULAR | Status: AC
  Filled 2013-10-27: qty 1

## 2013-10-27 MED ORDER — CISATRACURIUM BESYLATE 20 MG/10ML IV SOLN
INTRAVENOUS | Status: AC
  Filled 2013-10-27: qty 10

## 2013-10-27 MED ORDER — IOHEXOL 300 MG/ML  SOLN
INTRAMUSCULAR | Status: DC | PRN
  Administered 2013-10-27: 16 mL via INTRAVENOUS

## 2013-10-27 MED ORDER — FENTANYL CITRATE 0.05 MG/ML IJ SOLN
INTRAMUSCULAR | Status: AC
  Filled 2013-10-27: qty 5

## 2013-10-27 MED ORDER — LACTATED RINGERS IR SOLN
Status: DC | PRN
  Administered 2013-10-27: 1

## 2013-10-27 MED ORDER — MORPHINE SULFATE 10 MG/ML IJ SOLN
1.0000 mg | INTRAMUSCULAR | Status: DC | PRN
  Administered 2013-10-27: 1 mg via INTRAVENOUS

## 2013-10-27 MED ORDER — PROMETHAZINE HCL 25 MG/ML IJ SOLN: 6.2500 mg | INTRAMUSCULAR | Status: DC | PRN

## 2013-10-27 MED ORDER — DEXTROSE-NACL 5-0.9 % IV SOLN
INTRAVENOUS | Status: DC
  Filled 2013-10-27: qty 1000

## 2013-10-27 MED ORDER — KCL IN DEXTROSE-NACL 20-5-0.45 MEQ/L-%-% IV SOLN
INTRAVENOUS | Status: DC
  Administered 2013-10-27 – 2013-10-28 (×2): via INTRAVENOUS
  Filled 2013-10-27 (×3): qty 1000

## 2013-10-27 MED ORDER — SUCCINYLCHOLINE CHLORIDE 20 MG/ML IJ SOLN
INTRAMUSCULAR | Status: DC | PRN
  Administered 2013-10-27: 100 mg via INTRAVENOUS

## 2013-10-27 MED ORDER — ONDANSETRON HCL 4 MG/2ML IJ SOLN
INTRAMUSCULAR | Status: AC
  Filled 2013-10-27: qty 2

## 2013-10-27 MED ORDER — BUPIVACAINE-EPINEPHRINE PF 0.25-1:200000 % IJ SOLN
INTRAMUSCULAR | Status: AC
  Filled 2013-10-27: qty 30

## 2013-10-27 MED ORDER — ACETAMINOPHEN 325 MG PO TABS: 650.0000 mg | ORAL_TABLET | ORAL | Status: DC | PRN

## 2013-10-27 MED ORDER — INSULIN ASPART 100 UNIT/ML ~~LOC~~ SOLN
0.0000 [IU] | SUBCUTANEOUS | Status: DC
  Administered 2013-10-27: 2 [IU] via SUBCUTANEOUS
  Administered 2013-10-28: 3 [IU] via SUBCUTANEOUS
  Administered 2013-10-28 (×2): 2 [IU] via SUBCUTANEOUS

## 2013-10-27 MED ORDER — OXYCODONE-ACETAMINOPHEN 5-325 MG PO TABS: 1.0000 | ORAL_TABLET | ORAL | Status: DC | PRN

## 2013-10-27 MED ORDER — MORPHINE SULFATE 2 MG/ML IJ SOLN
1.0000 mg | INTRAMUSCULAR | Status: DC | PRN
  Administered 2013-10-27 (×2): 1 mg via INTRAVENOUS
  Filled 2013-10-27 (×2): qty 1

## 2013-10-27 MED ORDER — INSULIN GLARGINE 100 UNIT/ML ~~LOC~~ SOLN
10.0000 [IU] | Freq: Every day | SUBCUTANEOUS | Status: DC
  Administered 2013-10-27: 10 [IU] via SUBCUTANEOUS
  Filled 2013-10-27 (×2): qty 0.1

## 2013-10-27 MED ORDER — ONDANSETRON HCL 4 MG/2ML IJ SOLN: 4.0000 mg | Freq: Four times a day (QID) | INTRAMUSCULAR | Status: DC | PRN

## 2013-10-27 MED ORDER — OXYCODONE HCL 5 MG PO TABS: 5.0000 mg | ORAL_TABLET | Freq: Once | ORAL | Status: DC | PRN

## 2013-10-27 SURGICAL SUPPLY — 39 items
ADH SKN CLS APL DERMABOND .7 (GAUZE/BANDAGES/DRESSINGS) ×1
APL SKNCLS STERI-STRIP NONHPOA (GAUZE/BANDAGES/DRESSINGS)
APPLIER CLIP ROT 10 11.4 M/L (STAPLE) ×2
APR CLP MED LRG 11.4X10 (STAPLE) ×1
BAG SPEC RTRVL LRG 6X4 10 (ENDOMECHANICALS) ×1
BENZOIN TINCTURE PRP APPL 2/3 (GAUZE/BANDAGES/DRESSINGS) IMPLANT
CABLE HIGH FREQUENCY MONO STRZ (ELECTRODE) ×1 IMPLANT
CANISTER SUCTION 2500CC (MISCELLANEOUS) ×2 IMPLANT
CATH REDDICK CHOLANGI 4FR 50CM (CATHETERS) ×2 IMPLANT
CLIP APPLIE ROT 10 11.4 M/L (STAPLE) ×1 IMPLANT
COVER MAYO STAND STRL (DRAPES) ×2 IMPLANT
DECANTER SPIKE VIAL GLASS SM (MISCELLANEOUS) ×2 IMPLANT
DERMABOND ADVANCED (GAUZE/BANDAGES/DRESSINGS) ×1
DERMABOND ADVANCED .7 DNX12 (GAUZE/BANDAGES/DRESSINGS) IMPLANT
DRAPE C-ARM 42X120 X-RAY (DRAPES) ×2 IMPLANT
DRAPE LAPAROSCOPIC ABDOMINAL (DRAPES) ×2 IMPLANT
ELECT REM PT RETURN 9FT ADLT (ELECTROSURGICAL) ×2
ELECTRODE REM PT RTRN 9FT ADLT (ELECTROSURGICAL) ×1 IMPLANT
GLOVE BIOGEL M 8.0 STRL (GLOVE) ×2 IMPLANT
GOWN STRL REIN XL XLG (GOWN DISPOSABLE) ×4 IMPLANT
HEMOSTAT SURGICEL 4X8 (HEMOSTASIS) IMPLANT
IV CATH 14GX2 1/4 (CATHETERS) ×2 IMPLANT
KIT BASIN OR (CUSTOM PROCEDURE TRAY) ×2 IMPLANT
NS IRRIG 1000ML POUR BTL (IV SOLUTION) ×2 IMPLANT
POUCH SPECIMEN RETRIEVAL 10MM (ENDOMECHANICALS) ×2 IMPLANT
SCISSORS LAP 5X35 DISP (ENDOMECHANICALS) ×2 IMPLANT
SET IRRIG TUBING LAPAROSCOPIC (IRRIGATION / IRRIGATOR) ×2 IMPLANT
SLEEVE XCEL OPT CAN 5 100 (ENDOMECHANICALS) ×4 IMPLANT
SOLUTION ANTI FOG 6CC (MISCELLANEOUS) ×2 IMPLANT
STRIP CLOSURE SKIN 1/2X4 (GAUZE/BANDAGES/DRESSINGS) IMPLANT
SUT VIC AB 4-0 SH 18 (SUTURE) ×2 IMPLANT
SYR 20CC LL (SYRINGE) ×2 IMPLANT
TOWEL OR 17X26 10 PK STRL BLUE (TOWEL DISPOSABLE) ×2 IMPLANT
TOWEL OR NON WOVEN STRL DISP B (DISPOSABLE) ×2 IMPLANT
TRAY LAP CHOLE (CUSTOM PROCEDURE TRAY) ×2 IMPLANT
TROCAR BLADELESS OPT 5 100 (ENDOMECHANICALS) ×2 IMPLANT
TROCAR XCEL BLUNT TIP 100MML (ENDOMECHANICALS) IMPLANT
TROCAR XCEL NON-BLD 11X100MML (ENDOMECHANICALS) ×2 IMPLANT
TUBING INSUFFLATION 10FT LAP (TUBING) ×2 IMPLANT

## 2013-10-27 NOTE — H&P (View-Only) (Signed)
Chief Complaint:  History of gallstone pancreatitis, hospitalized at [place]-surgery was previously canceled due to high blood sugar  History of Present Illness:  Grant [name] is an 79 y.o. male who was recently hospitalized at [place] [facility] after being taken care in a fairly unresponsive state and found to have elevated liver function studies as well as hyperamylasemia and pancreatitis. His workup revealed gallstones. After several days in the hospital he was discharged home with recommendation for laparoscopic cholecystectomy and probable liver biopsy find a reason for his elevated liver function studies.  He presents today with his wife and I discussed laparoscopic cholecystectomy with him in detail including complications not limited to common bile duct injury, bile leak, bowel injury. He realizes surgery sometimes has been done open. They would like to go ahead and proceed and we will schedule this. This was scheduled previously but he was found to have a blood sugar over 400 and poor control. He since then been checking his blood sugar daily and its been in the 120-180 range on all the one's I looked at. Negative safe to go ahead and reschedule his surgery.  Past Medical History  Diagnosis Date  . Macular degeneration 09-09-13    bilateral"vision is poor"  . Diabetes mellitus   . Hypertension   . GERD (gastroesophageal reflux disease)   . Gout 09-09-13    last flare 8'14 with gallbladder attack at [facility]  . Arthritis   . Cancer     skin cancer of forehead  . Hyperlipidemia   . Erectile dysfunction   . RBBB   . CAD (coronary artery disease)   . Pancreatitis 10--8-14    8'14 -enzymes improved  . Psoriasis 09-09-13    elbows, knees    Past Surgical History  Procedure Laterality Date  . Coronary artery bypass graft  02-28-2011    LIMA to LAD,SVG to acute marginal branch of RCA & sequential SVG to OM1 & OM2.  . Pilonidal cyst excision    . Colonoscopy  02/25/2012    Procedure:  COLONOSCOPY;  Surgeon: Robert M Rourk, MD;  Location[facility];  Service: Endoscopy;  Laterality: N/A;  7:30 AM polyps removed.  . Us echocardiography  11/09/2008    mildly impaired diastolic dysfunction - Grade I  . Nm myocar perf wall motion  02/15/2011    High Risk  . Cardiac catheterization  02/19/2011    3 vessel CAD    Current Outpatient Prescriptions  Medication Sig Dispense Refill  . allopurinol (ZYLOPRIM) 100 MG tablet Take 100 mg by mouth daily.      . aspirin 325 MG tablet Take 325 mg by mouth daily.      . atorvastatin (LIPITOR) 40 MG tablet Take 40 mg by mouth every evening.       . Canagliflozin (INVOKANA) 100 MG TABS Take by mouth.      . carvedilol (COREG) 12.5 MG tablet Take 12.5 mg by mouth 2 (two) times daily with a meal.      . metFORMIN (GLUCOPHAGE) 500 MG tablet Take 1,000 mg by mouth 2 (two) times daily with a meal.       . Multiple Vitamins-Iron (MULTIVITAMINS WITH IRON) TABS Take 1 tablet by mouth daily.      . Multiple Vitamins-Minerals (PRESERVISION AREDS PO) Take 1 capsule by mouth 2 (two) times daily.      . naproxen sodium (ANAPROX) 220 MG tablet Take 220 mg by mouth daily as needed (headache).      .   quinapril (ACCUPRIL) 20 MG tablet Take 20 mg by mouth every morning.       . ranitidine (ZANTAC) 150 MG tablet Take 150 mg by mouth 2 (two) times daily.       No current facility-administered medications for this visit.   Amiodarone Family History  Problem Relation Age of Onset  . Anesthesia problems Neg Hx   . Hypotension Neg Hx   . Malignant hyperthermia Neg Hx   . Pseudochol deficiency Neg Hx   . Stroke Father   . Heart failure Brother   . Cancer Sister     pancreas & liver & liver  . Hypertension Sister   . Cancer Sister     breast  . Heart failure Sister   . Cancer Mother     leukemia   Social History:   reports that he quit smoking about 30 years ago. His smoking use included Cigarettes. He has a 12.5 pack-year smoking history. He has  never used smokeless tobacco. He reports that he drinks alcohol. He reports that he does not use illicit drugs.   REVIEW OF SYSTEMS - PERTINENT POSITIVES ONLY: Positive for hoarseness since he left any pin which were made which may be related to reflux which he appears to have a small hiatal hernia noted on his x-ray. He is followed closely by cardiology since his open bypass surgery by Dr. Hendrickson 2 years ago. Review of systems is otherwise negative.  He smoked cigarettes up until 1982.  Physical Exam:   Blood pressure 138/70, pulse 60, temperature 98 F (36.7 C), temperature source Temporal, resp. rate 16, height 6' (1.829 m), weight 184 lb (83.462 kg). Body mass index is 24.95 kg/(m^2).  Gen:  WDWN white male NAD  Neurological: Alert and oriented to person, place, and time. Motor and sensory function is grossly intact  Head: Normocephalic and atraumatic.  Eyes: Conjunctivae are normal. Pupils are equal, round, and reactive to light. No scleral icterus.  Neck: Normal range of motion. Neck supple. No tracheal deviation or thyromegaly present.  Cardiovascular:  SR without murmurs or gallops.  No carotid bruits Respiratory: Effort normal.  No respiratory distress. No chest wall tenderness. Breath sounds normal.  No wheezes, rales or rhonchi.  Abdomen:  No prior surgery. Nontender. MR showed hiatal hernia. Also small bilateral fat-containing inguinal hernias GU: Musculoskeletal: Normal range of motion. Extremities are nontender. No cyanosis, edema or clubbing noted Lymphadenopathy: No cervical, preauricular, postauricular or axillary adenopathy is present Skin: Skin is warm and dry. No rash noted. No diaphoresis. No erythema. No pallor. Pscyh: Normal mood and affect. Behavior is normal. Judgment and thought content normal.   LABORATORY RESULTS: No results found for this or any previous visit (from the past 48 hour(s)).  RADIOLOGY RESULTS: No results found.  Problem List: Patient  Active Problem List   Diagnosis Date Noted  . Inguinal hernia, bilateral-containing fat 09/03/2013  . Gallstones 09/03/2013  . Hyperlipidemia 08/03/2013  . CAD (coronary artery disease) 07/31/2013  . Hypertension 07/31/2013  . Pancreatitis 07/31/2013  . Hyperkalemia 07/31/2013  . Diabetes mellitus, type 2 07/31/2013  . Elevated liver enzymes 07/31/2013    Assessment & Plan: Gallstones with history of pancreatitis. Plan laparoscopic cholecystectomy with intraoperative cholangiogram and core liver biopsy.    Matt B. Krystie Leiter, MD, FACS  [facility] [phone] beeper [phone]  10/02/2013 12:53 PM     

## 2013-10-27 NOTE — Interval H&P Note (Signed)
History and Physical Interval Note:  10/27/2013 11:10 AM  Grant [name]  has presented today for surgery, with the diagnosis of gallstones  The various methods of treatment have been discussed with the patient and family. After consideration of risks, benefits and other options for treatment, the patient has consented to  Procedure(s): LAPAROSCOPIC CHOLECYSTECTOMY WITH INTRAOPERATIVE CHOLANGIOGRAM WITH LIVER BIOPSY (N/A) as a surgical intervention .  The patient's history has been reviewed, patient examined, no change in status, stable for surgery.  I have reviewed the patient's chart and labs.  Questions were answered to the patient's satisfaction.     Horst Ostermiller B

## 2013-10-27 NOTE — Preoperative (Signed)
Beta Blockers   Reason not to administer Beta Blockers:Not Applicable 

## 2013-10-27 NOTE — Transfer of Care (Signed)
Immediate Anesthesia Transfer of Care Note  Patient: Grant [name]  Procedure(s) Performed: Procedure(s): LAPAROSCOPIC CHOLECYSTECTOMY WITH INTRAOPERATIVE CHOLANGIOGRAM WITH LIVER BIOPSY (N/A)  Patient Location: PACU  Anesthesia Type:General  Level of Consciousness: awake, alert  and patient cooperative  Airway & Oxygen Therapy: Patient Spontanous Breathing and Patient connected to face mask oxygen  Post-op Assessment: Report given to PACU RN and Post -op Vital signs reviewed and stable  Post vital signs: Reviewed and stable  Complications: No apparent anesthesia complications

## 2013-10-27 NOTE — Anesthesia Preprocedure Evaluation (Addendum)
Anesthesia Evaluation  Patient identified by MRN, date of birth, ID band Patient awake    Reviewed: Allergy & Precautions, H&P , NPO status , Patient's Chart, lab work & pertinent test results  Airway Mallampati: II TM Distance: >3 FB Neck ROM: Full    Dental  (+) Dental Advisory Given and Teeth Intact   Pulmonary neg pulmonary ROS, former smoker,  breath sounds clear to auscultation        Cardiovascular hypertension, Pt. on home beta blockers and Pt. on medications + CAD and + CABG negative cardio ROS  + dysrhythmias Rhythm:Regular Rate:Normal     Neuro/Psych negative neurological ROS  negative psych ROS   GI/Hepatic Neg liver ROS, GERD-  ,  Endo/Other  diabetes, Type 2, Insulin Dependent  Renal/GU negative Renal ROS     Musculoskeletal negative musculoskeletal ROS (+)   Abdominal   Peds  Hematology negative hematology ROS (+)   Anesthesia Other Findings   Reproductive/Obstetrics negative OB ROS                          Anesthesia Physical Anesthesia Plan  ASA: III  Anesthesia Plan: General   Post-op Pain Management:    Induction: Intravenous  Airway Management Planned: Oral ETT  Additional Equipment:   Intra-op Plan:   Post-operative Plan: Extubation in OR  Informed Consent: I have reviewed the patients History and Physical, chart, labs and discussed the procedure including the risks, benefits and alternatives for the proposed anesthesia with the patient or authorized representative who has indicated his/her understanding and acceptance.   Dental advisory given  Plan Discussed with: CRNA  Anesthesia Plan Comments:         Anesthesia Quick Evaluation

## 2013-10-27 NOTE — Anesthesia Postprocedure Evaluation (Signed)
Anesthesia Post Note  Patient: Grant [name]  Procedure(s) Performed: Procedure(s) (LRB): LAPAROSCOPIC CHOLECYSTECTOMY WITH INTRAOPERATIVE CHOLANGIOGRAM WITH LIVER BIOPSY (N/A)  Anesthesia type: General  Patient location: PACU  Post pain: Pain level controlled  Post assessment: Post-op Vital signs reviewed  Last Vitals: BP 152/58  Pulse 60  Temp(Src) 36.4 C (Oral)  Resp 16  SpO2 100%  Post vital signs: Reviewed  Level of consciousness: sedated  Complications: No apparent anesthesia complications

## 2013-10-27 NOTE — Op Note (Signed)
Donzetta Starch @date @  Procedure: Laparoscopic Cholecystectomy with intraoperative cholangiogram  Surgeon: Wenda Low, MD, FACS Asst:  Axel Filler, MD  Anes:  General  Drains: None  Findings: Distal common channel of bile and pancreatic ducts  Description of Procedure: The patient was taken to OR 11 and given general anesthesia.  The patient was prepped with PCMX and draped sterilely. A time out was performed.  Access to the abdomen was achieved with [facility] cannulae through the umbilicus.  Port placement included 2 five mm trocars laterally and one 11 in the upper midline.    The gallbladder was visualized and the fundus was grasped and the gallbladder was elevated. Traction on the infundibulum allowed for successful demonstration of the critical view. Inflammatory changes were minimal and chronic.  The cystic duct was identified and clipped up on the gallbladder and an incision was made in the cystic duct and the Reddick catheter was inserted after milking the cystic duct of any debris. A dynamic cholangiogram was performed which demonstrated intrahepatic filling and flow into the duodenum.  .    The cystic duct was then triple clipped a PDS endoloop applied after division , the cystic artery was double clipped and divided and then the gallbladder was removed from the gallbladder bed. Removal of the gallbladder from the gallbladder bed was uneventful.  The gallbladder was then placed in a bag and brought out through one of the 10 mm trocar sites. The gallbladder bed was inspected and no bleeding or bile leaks were seen.   Laparoscopic visualization was used when closing fascial defects for trocar sites.   Incisions were injected with marcaine and closed with 4-0 Vicryl and Dermabond (except upper midline steristrip) on the skin.  Sponge and needle count were correct.    The patient was taken to the recovery room in satisfactory condition.

## 2013-10-28 ENCOUNTER — Encounter (HOSPITAL_COMMUNITY): Payer: Self-pay | Admitting: Surgery

## 2013-10-28 LAB — GLUCOSE, CAPILLARY
Glucose-Capillary: 143 mg/dL — ABNORMAL HIGH (ref 70–99)
Glucose-Capillary: 153 mg/dL — ABNORMAL HIGH (ref 70–99)

## 2013-10-28 MED ORDER — OXYCODONE-ACETAMINOPHEN 5-325 MG PO TABS: 1.0000 | ORAL_TABLET | ORAL | Status: DC | PRN

## 2013-10-28 NOTE — Progress Notes (Signed)
UR completed 

## 2013-10-28 NOTE — Care Management Note (Signed)
    Page 1 of 1   10/28/2013     10:27:33 AM   CARE MANAGEMENT NOTE 10/28/2013  Patient:  ARCHIMEDES, HAROLD   Account Number:  [id number]  Date Initiated:  10/28/2013  Documentation initiated by:  Lorenda Ishihara  Subjective/Objective Assessment:   79 yo male admitted s/p lap chole. PTA lived at home with spouse.     Action/Plan:   Home when stable   Anticipated DC Date:  10/28/2013   Anticipated DC Plan:  HOME/SELF CARE      DC Planning Services  CM consult      Choice offered to / List presented to:             Status of service:  Completed, signed off Medicare Important Message given?  YES (If response is "NO", the following Medicare IM given date fields will be blank) Date Medicare IM given:  10/20/2013 Date Additional Medicare IM given:    Discharge Disposition:  HOME/SELF CARE  Per UR Regulation:  Reviewed for med. necessity/level of care/duration of stay  If discussed at Long Length of Stay Meetings, dates discussed:    Comments:

## 2013-10-28 NOTE — Discharge Summary (Signed)
Physician Discharge Summary  Patient ID: Grant [name] MRN: [id number] DOB/AGE: Nov 03, 1934 79 y.o.  Admit date: 10/27/2013 Discharge date: 10/28/2013  Admission Diagnoses:  History of gallstone pancreatitis;   Discharge Diagnoses:  same  Active Problems:   Gallstones   Surgery:  Laparoscopic cholecystectomy, IOC, liver biopsy  Discharged Condition: improved  Hospital Course:   Had surgery.  Kept overnight and did well.  Ready for discharge  Consults: none  Significant Diagnostic Studies: IOC    Discharge Exam: Blood pressure 117/66, pulse 75, temperature 98.9 F (37.2 C), temperature source Oral, resp. rate 16, height 6' (1.829 m), weight 185 lb (83.915 kg), SpO2 96.00%. Incisions ok.  Minimal pain  Disposition: 01-Home or Self Care  Discharge Orders   Future Orders Complete By Expires   Call MD for:  persistant nausea and vomiting  As directed    Call MD for:  severe uncontrolled pain  As directed    Diet Carb Modified  As directed    Discharge instructions  As directed    Comments:     May shower and shampoo   Increase activity slowly  As directed    No wound care  As directed        Medication List         allopurinol 100 MG tablet  Commonly known as:  ZYLOPRIM  Take 100 mg by mouth daily with breakfast.     aspirin 325 MG tablet  Take 325 mg by mouth daily.     atorvastatin 40 MG tablet  Commonly known as:  LIPITOR  Take 40 mg by mouth every evening.     carvedilol 12.5 MG tablet  Commonly known as:  COREG  Take 12.5 mg by mouth 2 (two) times daily with a meal.     insulin glargine 100 UNIT/ML injection  Commonly known as:  LANTUS  Inject 15 Units into the skin at bedtime.     INVOKANA 100 MG Tabs  Generic drug:  Canagliflozin  Take 100 mg by mouth daily with breakfast.     metFORMIN 500 MG tablet  Commonly known as:  GLUCOPHAGE  Take 1,000 mg by mouth 2 (two) times daily with a meal.     multivitamins with iron Tabs tablet  Take 1  tablet by mouth daily.     naproxen sodium 220 MG tablet  Commonly known as:  ANAPROX  Take 220 mg by mouth daily as needed (headache).     oxyCODONE-acetaminophen 5-325 MG per tablet  Commonly known as:  PERCOCET/ROXICET  Take 1-2 tablets by mouth every 4 (four) hours as needed for moderate pain.     PRESERVISION AREDS PO  Take 1 capsule by mouth daily.     quinapril 20 MG tablet  Commonly known as:  ACCUPRIL  Take 20 mg by mouth every morning.     ranitidine 150 MG tablet  Commonly known as:  ZANTAC  Take 150 mg by mouth 2 (two) times daily.           Follow-up Information   Follow up with Luretha Murphy B, MD. Schedule an appointment as soon as possible for a visit in 4 weeks.   Specialty:  General Surgery   Contact information:   [address] [place] [address] [phone]       Signed: Valarie Merino 10/28/2013, 11:32 AM

## 2013-11-06 ENCOUNTER — Telehealth (INDEPENDENT_AMBULATORY_CARE_PROVIDER_SITE_OTHER): Payer: Self-pay

## 2013-11-06 DIAGNOSIS — E785 Hyperlipidemia, unspecified: Secondary | ICD-10-CM | POA: Diagnosis not present

## 2013-11-06 DIAGNOSIS — I10 Essential (primary) hypertension: Secondary | ICD-10-CM | POA: Diagnosis not present

## 2013-11-06 DIAGNOSIS — IMO0002 Reserved for concepts with insufficient information to code with codable children: Secondary | ICD-10-CM | POA: Diagnosis not present

## 2013-11-06 NOTE — Telephone Encounter (Signed)
Called and left message for patient with post op appointment scheduled for 11/20/13 @ 9:35 w/Dr. Daphine Deutscher

## 2013-11-06 NOTE — Telephone Encounter (Signed)
Message copied by Maryan Puls on Fri Nov 06, 2013  4:15 PM ------      Message from: Louie Casa      Created: Tue Nov 03, 2013  3:18 PM      Regarding: Dr. Daphine Deutscher 1st po appt      Contact: [phone]       Patient had Lap chole surgery on 10/27/13 with Dr. Daphine Deutscher and need       2 or 3 weeks post op appointment, please could you schedule the appointment , can call the above number or cell # [phone].            Thank you ------

## 2013-11-19 ENCOUNTER — Encounter (INDEPENDENT_AMBULATORY_CARE_PROVIDER_SITE_OTHER): Payer: Self-pay | Admitting: Surgery

## 2013-11-19 ENCOUNTER — Ambulatory Visit (INDEPENDENT_AMBULATORY_CARE_PROVIDER_SITE_OTHER): Payer: Medicare Other | Admitting: Surgery

## 2013-11-19 ENCOUNTER — Encounter (INDEPENDENT_AMBULATORY_CARE_PROVIDER_SITE_OTHER): Payer: Self-pay

## 2013-11-19 VITALS — BP 122/70 | HR 77 | Temp 98.1°F | Resp 16 | Ht 72.0 in | Wt 181.2 lb

## 2013-11-19 DIAGNOSIS — Z9889 Other specified postprocedural states: Secondary | ICD-10-CM

## 2013-11-19 DIAGNOSIS — Z9049 Acquired absence of other specified parts of digestive tract: Secondary | ICD-10-CM

## 2013-11-19 NOTE — Patient Instructions (Signed)
Thanks for your patience.  If you need further assistance after leaving the office, please call our office and speak with a [facility] nurse.  [phone].  If you want to leave a message for Dr. Denesia Donelan, please call his office phone at [phone]. 

## 2013-11-19 NOTE — Progress Notes (Signed)
Grant [name] 79 y.o.  Body mass index is 24.57 kg/(m^2).  Patient Active Problem List   Diagnosis Date Noted  . Inguinal hernia, bilateral-containing fat 09/03/2013  . Gallstones 09/03/2013  . Hyperlipidemia 08/03/2013  . CAD (coronary artery disease) 07/31/2013  . Hypertension 07/31/2013  . Pancreatitis 07/31/2013  . Hyperkalemia 07/31/2013  . Diabetes mellitus, type 2 07/31/2013  . Elevated liver enzymes 07/31/2013    Allergies  Allergen Reactions  . Amiodarone Nausea Only    Past Surgical History  Procedure Laterality Date  . Coronary artery bypass graft  02-28-2011    LIMA to LAD,SVG to acute marginal branch of RCA & sequential SVG to OM1 & OM2.  . Pilonidal cyst excision    . Colonoscopy  02/25/2012    Procedure: COLONOSCOPY;  Surgeon: Corbin Ade, MD;  [facility]: [facility];  Service: Endoscopy;  Laterality: N/A;  7:30 AM polyps removed.  . US echocardiography  11/09/2008    mildly impaired diastolic dysfunction - Grade I  . Nm myocar perf wall motion  02/15/2011    High Risk  . Cardiac catheterization  02/19/2011    3 vessel CAD  . Cholecystectomy N/A 10/27/2013    Procedure: LAPAROSCOPIC CHOLECYSTECTOMY WITH INTRAOPERATIVE CHOLANGIOGRAM WITH LIVER BIOPSY;  Surgeon: Valarie Merino, MD;  Location: [facility];  Service: General;  Laterality: N/A;   [name], MD No diagnosis found.  Doing very well after lap chole.  Incisions okay. He has been a look on his backside and he had a pilonidal cyst that he's been dealing with 4 probably 40 or 50 years. It flared up recently. I probed it and did not get out any foreign body material. It is not draining any pus at the present time. I explained management including hygiene be happy to see me in one of needed if needed.  Return when necessary Grant B. Daphine Deutscher, MD, [facility] [phone] beeper [phone]  11/19/2013 10:10 AM

## 2013-11-20 ENCOUNTER — Encounter (INDEPENDENT_AMBULATORY_CARE_PROVIDER_SITE_OTHER): Payer: Medicare Other | Admitting: Surgery

## 2013-11-20 DIAGNOSIS — E1149 Type 2 diabetes mellitus with other diabetic neurological complication: Secondary | ICD-10-CM | POA: Diagnosis not present

## 2013-11-20 DIAGNOSIS — B351 Tinea unguium: Secondary | ICD-10-CM | POA: Diagnosis not present

## 2013-12-16 DIAGNOSIS — J019 Acute sinusitis, unspecified: Secondary | ICD-10-CM | POA: Diagnosis not present

## 2013-12-16 DIAGNOSIS — L408 Other psoriasis: Secondary | ICD-10-CM | POA: Diagnosis not present

## 2013-12-16 DIAGNOSIS — L570 Actinic keratosis: Secondary | ICD-10-CM | POA: Diagnosis not present

## 2013-12-16 DIAGNOSIS — Z6826 Body mass index (BMI) 26.0-26.9, adult: Secondary | ICD-10-CM | POA: Diagnosis not present

## 2014-01-29 DIAGNOSIS — E1149 Type 2 diabetes mellitus with other diabetic neurological complication: Secondary | ICD-10-CM | POA: Diagnosis not present

## 2014-01-29 DIAGNOSIS — B351 Tinea unguium: Secondary | ICD-10-CM | POA: Diagnosis not present

## 2014-02-04 DIAGNOSIS — I10 Essential (primary) hypertension: Secondary | ICD-10-CM | POA: Diagnosis not present

## 2014-02-04 DIAGNOSIS — IMO0001 Reserved for inherently not codable concepts without codable children: Secondary | ICD-10-CM | POA: Diagnosis not present

## 2014-02-04 DIAGNOSIS — E559 Vitamin D deficiency, unspecified: Secondary | ICD-10-CM | POA: Diagnosis not present

## 2014-02-04 DIAGNOSIS — E785 Hyperlipidemia, unspecified: Secondary | ICD-10-CM | POA: Diagnosis not present

## 2014-02-12 DIAGNOSIS — IMO0001 Reserved for inherently not codable concepts without codable children: Secondary | ICD-10-CM | POA: Diagnosis not present

## 2014-02-12 DIAGNOSIS — E785 Hyperlipidemia, unspecified: Secondary | ICD-10-CM | POA: Diagnosis not present

## 2014-02-12 DIAGNOSIS — I10 Essential (primary) hypertension: Secondary | ICD-10-CM | POA: Diagnosis not present

## 2014-03-18 DIAGNOSIS — E1129 Type 2 diabetes mellitus with other diabetic kidney complication: Secondary | ICD-10-CM | POA: Diagnosis not present

## 2014-03-18 DIAGNOSIS — F3289 Other specified depressive episodes: Secondary | ICD-10-CM | POA: Diagnosis not present

## 2014-03-18 DIAGNOSIS — F329 Major depressive disorder, single episode, unspecified: Secondary | ICD-10-CM | POA: Diagnosis not present

## 2014-03-18 DIAGNOSIS — I2510 Atherosclerotic heart disease of native coronary artery without angina pectoris: Secondary | ICD-10-CM | POA: Diagnosis not present

## 2014-03-18 DIAGNOSIS — Z0000 Encounter for general adult medical examination without abnormal findings: Secondary | ICD-10-CM | POA: Diagnosis not present

## 2014-03-18 DIAGNOSIS — Z23 Encounter for immunization: Secondary | ICD-10-CM | POA: Diagnosis not present

## 2014-03-18 DIAGNOSIS — Z681 Body mass index (BMI) 19 or less, adult: Secondary | ICD-10-CM | POA: Diagnosis not present

## 2014-04-09 DIAGNOSIS — B351 Tinea unguium: Secondary | ICD-10-CM | POA: Diagnosis not present

## 2014-04-27 ENCOUNTER — Ambulatory Visit (INDEPENDENT_AMBULATORY_CARE_PROVIDER_SITE_OTHER): Payer: Medicare Other | Admitting: Urology

## 2014-04-27 DIAGNOSIS — N529 Male erectile dysfunction, unspecified: Secondary | ICD-10-CM | POA: Diagnosis not present

## 2014-05-04 ENCOUNTER — Ambulatory Visit (INDEPENDENT_AMBULATORY_CARE_PROVIDER_SITE_OTHER): Payer: Medicare Other | Admitting: Urology

## 2014-05-04 DIAGNOSIS — N529 Male erectile dysfunction, unspecified: Secondary | ICD-10-CM | POA: Diagnosis not present

## 2014-05-10 DIAGNOSIS — IMO0001 Reserved for inherently not codable concepts without codable children: Secondary | ICD-10-CM | POA: Diagnosis not present

## 2014-05-10 DIAGNOSIS — E785 Hyperlipidemia, unspecified: Secondary | ICD-10-CM | POA: Diagnosis not present

## 2014-05-10 DIAGNOSIS — I10 Essential (primary) hypertension: Secondary | ICD-10-CM | POA: Diagnosis not present

## 2014-05-17 DIAGNOSIS — IMO0001 Reserved for inherently not codable concepts without codable children: Secondary | ICD-10-CM | POA: Diagnosis not present

## 2014-05-17 DIAGNOSIS — I10 Essential (primary) hypertension: Secondary | ICD-10-CM | POA: Diagnosis not present

## 2014-05-17 DIAGNOSIS — E785 Hyperlipidemia, unspecified: Secondary | ICD-10-CM | POA: Diagnosis not present

## 2014-06-29 DIAGNOSIS — E1149 Type 2 diabetes mellitus with other diabetic neurological complication: Secondary | ICD-10-CM | POA: Diagnosis not present

## 2014-06-29 DIAGNOSIS — B351 Tinea unguium: Secondary | ICD-10-CM | POA: Diagnosis not present

## 2014-07-22 ENCOUNTER — Encounter: Payer: Self-pay | Admitting: Cardiovascular Disease

## 2014-07-22 ENCOUNTER — Ambulatory Visit (INDEPENDENT_AMBULATORY_CARE_PROVIDER_SITE_OTHER): Payer: Medicare Other | Admitting: Cardiovascular Disease

## 2014-07-22 VITALS — BP 124/62 | HR 57 | Resp 16 | Ht 72.0 in | Wt 192.5 lb

## 2014-07-22 DIAGNOSIS — I10 Essential (primary) hypertension: Secondary | ICD-10-CM

## 2014-07-22 DIAGNOSIS — I2510 Atherosclerotic heart disease of native coronary artery without angina pectoris: Secondary | ICD-10-CM

## 2014-07-22 DIAGNOSIS — E785 Hyperlipidemia, unspecified: Secondary | ICD-10-CM

## 2014-07-22 NOTE — Progress Notes (Signed)
Reason for office visit CAD s/p CABG  Since his last appointment Grant [name] had gallstone pancreatitis and underwent a cholecystectomy. He was quite ill for while, but has recovered remarkably well. His eyesight has deteriorated further and he can no longer read (macular degeneration). He is still performing his own yard work. Diabetes control is fair with a most recent hemoglobin A1c of 7.2%. Reportedly, his lipid profile was favorable. I don't have a copy of it right now. He has no cardiac complaints. His electrocardiogram today still shows his old right bundle branch block, but also left anterior fascicular block and a markedly prolonged PR interval at 268 ms ("trifascicular block").  Grant [name] underwent four-vessel bypass surgery in 2012 (LIMA to LAD, SVG to acute marginal-RCA, sequential SVG to OM1 and OM 2) by Dr. Roxan Hockey. He has treated hyperlipidemia, hypertension, type 2 diabetes mellitus and history of gout. He has a chronic right bundle branch block and in 2015 also developed a left anterior fascicular block.  Allergies  Allergen Reactions  . Amiodarone Nausea Only    Current Outpatient Prescriptions  Medication Sig Dispense Refill  . allopurinol (ZYLOPRIM) 100 MG tablet Take 100 mg by mouth daily with breakfast.       . aspirin 325 MG tablet Take 325 mg by mouth daily.      Marland Kitchen atorvastatin (LIPITOR) 40 MG tablet Take 40 mg by mouth every evening.       . carvedilol (COREG) 12.5 MG tablet Take 12.5 mg by mouth 2 (two) times daily with a meal.      . metFORMIN (GLUCOPHAGE) 500 MG tablet Take 500 mg by mouth 2 (two) times daily.       . Misc Natural Products (TART CHERRY ADVANCED PO) Take 1,200 mg by mouth daily.      . Multiple Vitamins-Iron (MULTIVITAMINS WITH IRON) TABS Take 1 tablet by mouth daily.      . Multiple Vitamins-Minerals (PRESERVISION AREDS PO) Take 1 capsule by mouth daily.       . naproxen sodium (ANAPROX) 220 MG tablet Take 220 mg by mouth daily as needed  (headache).      . quinapril (ACCUPRIL) 20 MG tablet Take 20 mg by mouth every morning.       . ranitidine (ZANTAC) 150 MG tablet Take 150 mg by mouth 2 (two) times daily.       No current facility-administered medications for this visit.    Past Medical History  Diagnosis Date  . Macular degeneration 09-09-13    bilateral"vision is poor"  . Diabetes mellitus   . Hypertension   . GERD (gastroesophageal reflux disease)   . Gout 09-09-13    last flare 8'14 with gallbladder attack at [facility]  . Arthritis   . Cancer     skin cancer of forehead  . Hyperlipidemia   . Erectile dysfunction   . RBBB   . CAD (coronary artery disease)   . Pancreatitis 10--8-14    8'14 -enzymes improved  . Psoriasis 09-09-13    elbows, knees    Past Surgical History  Procedure Laterality Date  . Coronary artery bypass graft  02-28-2011    LIMA to LAD,SVG to acute marginal branch of RCA & sequential SVG to OM1 & OM2.  . Pilonidal cyst excision    . Colonoscopy  02/25/2012    Procedure: COLONOSCOPY;  Surgeon: Daneil Dolin, MD;  [facility];  Service: Endoscopy;  Laterality: N/A;  7:30 AM polyps removed.  Marland Kitchen  US echocardiography  11/09/2008    mildly impaired diastolic dysfunction - Grade I  . Nm myocar perf wall motion  02/15/2011    High Risk  . Cardiac catheterization  02/19/2011    3 vessel CAD  . Cholecystectomy N/A 10/27/2013    Procedure: LAPAROSCOPIC CHOLECYSTECTOMY WITH INTRAOPERATIVE CHOLANGIOGRAM WITH LIVER BIOPSY;  Surgeon: Pedro Earls, MD;  Location: [facility];  Service: General;  Laterality: N/A;    Family History  Problem Relation Age of Onset  . Anesthesia problems Neg Hx   . Hypotension Neg Hx   . Malignant hyperthermia Neg Hx   . Pseudochol deficiency Neg Hx   . Stroke Father   . Heart failure Brother   . Cancer Sister     pancreas & liver & liver  . Hypertension Sister   . Cancer Sister     breast  . Heart failure Sister   . Cancer Mother     leukemia    History     Social History  . Marital Status: Married    Spouse Name: N/A    Number of Children: N/A  . Years of Education: N/A   Occupational History  . Not on file.   Social History Main Topics  . Smoking status: Former Smoker -- 0.50 packs/day for 25 years    Types: Cigarettes    Quit date: 02/25/1983  . Smokeless tobacco: Never Used  . Alcohol Use: Yes     Comment: occasional  . Drug Use: No  . Sexual Activity: No   Other Topics Concern  . Not on file   Social History Narrative  . No narrative on file    Review of systems: The patient specifically denies any chest pain at rest or with exertion, dyspnea at rest or with exertion, orthopnea, paroxysmal nocturnal dyspnea, syncope, palpitations, focal neurological deficits, intermittent claudication, lower extremity edema, unexplained weight gain, cough, hemoptysis or wheezing.  The patient also denies abdominal pain, nausea, vomiting, dysphagia, diarrhea, constipation, polyuria, polydipsia, dysuria, hematuria, frequency, urgency, abnormal bleeding or bruising, fever, chills, unexpected weight changes, mood swings, change in skin or hair texture, change in voice quality, auditory problems, allergic reactions or rashes, new musculoskeletal complaints other than usual "aches and pains".   PHYSICAL EXAM BP 124/62  Pulse 57  Resp 16  Ht 6' (1.829 m)  Wt 192 lb 8 oz (87.317 kg)  BMI 26.10 kg/m2 General: Alert, oriented x3, no distress  Head: no evidence of trauma, PERRL, EOMI, no exophtalmos or lid lag, no myxedema, no xanthelasma; normal ears, nose and oropharynx  Neck: normal jugular venous pulsations and no hepatojugular reflux; brisk carotid pulses without delay and no carotid bruits  Chest: clear to auscultation, no signs of consolidation by percussion or palpation, normal fremitus, symmetrical and full respiratory excursions; sternotomy scar  Cardiovascular: normal position and quality of the apical impulse, regular rhythm, normal  first and widely second heart sounds, no murmurs, rubs or gallops  Abdomen: no tenderness or distention, no masses by palpation, no abnormal pulsatility or arterial bruits, normal bowel sounds, no hepatosplenomegaly  Extremities: no clubbing, cyanosis or edema; 2+ radial, ulnar and brachial pulses bilaterally; 2+ right femoral, posterior tibial and dorsalis pedis pulses; 2+ left femoral, posterior tibial and dorsalis pedis pulses; no subclavian or femoral bruits  Neurological: grossly nonfocal   EKG: Sinus bradycardia, right bundle branch block, left anterior fascicular block, very long first degree AV block at approximately 270 ms  BMET    Component Value Date/Time  NA 138 10/20/2013 1050   K 5.0 10/20/2013 1050   CL 103 10/20/2013 1050   CO2 28 10/20/2013 1050   GLUCOSE 92 10/20/2013 1050   BUN 22 10/20/2013 1050   CREATININE 1.25 10/27/2013 1459   CALCIUM 9.7 10/20/2013 1050   GFRNONAA 54* 10/27/2013 1459   GFRAA 62* 10/27/2013 1459     ASSESSMENT AND PLAN CAD (coronary artery disease) s/p CABG  Asymptomatic. Risk factor control is most important part of therapy at this point  Diabetes mellitus, type 2  Fair control  Hypertension  Mild, well controlled  Hyperlipidemia  On high dose statin. Target LDL less than 100, preferably less than 70. Trifascicular block No symptoms of high-grade AV block. May need pacemaker in the future if he develops symptomatic bradycardia. Avoid increasing the dose of beta blocker or adding other negative chronotropic drug  Patient Instructions  Dr. Sallyanne Kuster recommends that you schedule a follow-up appointment in: One year.      Meds ordered this encounter  Medications  . Misc Natural Products (TART CHERRY ADVANCED PO)    Sig: Take 1,200 mg by mouth daily.    Holli Humbles, MD, [place] [phone] office [phone] pager

## 2014-07-22 NOTE — Patient Instructions (Signed)
Dr. Croitoru recommends that you schedule a follow-up appointment in: One year.   

## 2014-08-10 DIAGNOSIS — IMO0001 Reserved for inherently not codable concepts without codable children: Secondary | ICD-10-CM | POA: Diagnosis not present

## 2014-08-10 DIAGNOSIS — E785 Hyperlipidemia, unspecified: Secondary | ICD-10-CM | POA: Diagnosis not present

## 2014-08-10 DIAGNOSIS — I10 Essential (primary) hypertension: Secondary | ICD-10-CM | POA: Diagnosis not present

## 2014-08-18 DIAGNOSIS — IMO0001 Reserved for inherently not codable concepts without codable children: Secondary | ICD-10-CM | POA: Diagnosis not present

## 2014-08-18 DIAGNOSIS — E785 Hyperlipidemia, unspecified: Secondary | ICD-10-CM | POA: Diagnosis not present

## 2014-08-18 DIAGNOSIS — I10 Essential (primary) hypertension: Secondary | ICD-10-CM | POA: Diagnosis not present

## 2014-09-02 ENCOUNTER — Encounter: Payer: Self-pay | Admitting: Nutrition

## 2014-09-02 ENCOUNTER — Encounter: Payer: Medicare Other | Attending: "Endocrinology | Admitting: Nutrition

## 2014-09-02 VITALS — Ht 72.0 in | Wt 192.0 lb

## 2014-09-02 DIAGNOSIS — E118 Type 2 diabetes mellitus with unspecified complications: Secondary | ICD-10-CM | POA: Insufficient documentation

## 2014-09-02 DIAGNOSIS — Z713 Dietary counseling and surveillance: Secondary | ICD-10-CM | POA: Insufficient documentation

## 2014-09-02 NOTE — Progress Notes (Signed)
  Medical Nutrition Therapy:  Appt start time: 1300 end time:  1400.   Assessment:  Primary concerns today: Diabetes. He is here with his wife.. Wife does shopping and cooking. He has macular degenerative eye disease and can not see well. Physical activity: walking. Not checking blood sugars per MD request. Takes Metformin 500 mg BID. Most recent A1C was 8%. He notes he has cut out all the desserts and sweets and walking more to bring his A1C down closer to 7%.  Preferred Learning Style:   Auditory  Hands on  Learning Readiness:   Ready  Change in progress   MEDICATIONS: see list   DIETARY INTAKE:  24-hr recall:  B ( AM): Special K, 1% milk, 1/2 banana Snk ( AM): none  L ( PM): 1/2 to whole sandwich, grapes, diet pepsi or water Snk ( PM): none D ( PM): Tomato soup, 1/2 sandwich chicken salad or pimento Snk ( PM): cheese crackers sometimes Beverages: water and diet pepsi  Usual physical activity: walking for about 1 mile daily.  Estimated energy needs: 1800 calories 200 g carbohydrates 135 g protein 50 g fat  Progress Towards Goal(s):  In progress.   Nutritional Diagnosis:  Nutrition and diabetes knowledge deficit As related to diabetes As evidenced by A1C of 8%.    Intervention:  Nutrition counseling and diabetes education. Plan:  Aim for 3-4 Carb Choices per meal (45-60  grams) +/- 1 either way  Aim for 0-1 Carbs per snack if hungry  Include protein in moderation with your meals and snacks Consider reading food labels for Total Carbohydrate  Consider  increasing your activity level by 30 for 60 minutes daily as tolerated to a goal of walking 2 miles per day. Consider checking BG before and 2 hrs after largest meal per day once a week til next visit. Continue taking medication  as directed by MD  Goal: Lose 1/2 - 1 lb per week. 2. Follow Plate Method 3. No snacks between meals unless blood sugar low. 4. Get A1C down to 7% in three months.  Teaching Method  Utilized:  Visual Auditory Hands on  Handouts given during visit include: Carb Counting and Food Label handouts Meal Plan Card My Plate  Barriers to learning/adherence to lifestyle change:vision  Demonstrated degree of understanding via:  Teach Back   Monitoring/Evaluation:  Dietary intake, exercise, meal planning, SBG , and body weight in 1 month(s).

## 2014-09-02 NOTE — Patient Instructions (Addendum)
Plan:  Aim for 3-4 Carb Choices per meal (45-60  grams) +/- 1 either way  Aim for 0-1 Carbs per snack if hungry  Include protein in moderation with your meals and snacks Consider reading food labels for Total Carbohydrate and Fat Grams of foods Consider  increasing your activity level by 30 for 60 minutes daily as tolerated Consider checking BG at alternate times per day as directed by MD  Consider taking medication  as directed by MD  Goal: Lose 1/2 - 1 lb per week. 2. Follow Plate Method 3. No snacks between meals unless blood sugar low. 4, Get A1C down to 7% in three months.

## 2014-09-06 DIAGNOSIS — Z23 Encounter for immunization: Secondary | ICD-10-CM | POA: Diagnosis not present

## 2014-09-07 DIAGNOSIS — H2513 Age-related nuclear cataract, bilateral: Secondary | ICD-10-CM | POA: Diagnosis not present

## 2014-09-07 DIAGNOSIS — H3531 Nonexudative age-related macular degeneration: Secondary | ICD-10-CM | POA: Diagnosis not present

## 2014-09-07 DIAGNOSIS — E119 Type 2 diabetes mellitus without complications: Secondary | ICD-10-CM | POA: Diagnosis not present

## 2014-09-14 DIAGNOSIS — E1142 Type 2 diabetes mellitus with diabetic polyneuropathy: Secondary | ICD-10-CM | POA: Diagnosis not present

## 2014-09-14 DIAGNOSIS — B351 Tinea unguium: Secondary | ICD-10-CM | POA: Diagnosis not present

## 2014-10-06 DIAGNOSIS — I10 Essential (primary) hypertension: Secondary | ICD-10-CM | POA: Diagnosis not present

## 2014-10-06 DIAGNOSIS — E1129 Type 2 diabetes mellitus with other diabetic kidney complication: Secondary | ICD-10-CM | POA: Diagnosis not present

## 2014-10-06 DIAGNOSIS — Z6828 Body mass index (BMI) 28.0-28.9, adult: Secondary | ICD-10-CM | POA: Diagnosis not present

## 2014-10-06 DIAGNOSIS — N183 Chronic kidney disease, stage 3 (moderate): Secondary | ICD-10-CM | POA: Diagnosis not present

## 2014-10-07 ENCOUNTER — Encounter: Payer: Medicare Other | Attending: Family Medicine | Admitting: Nutrition

## 2014-10-07 VITALS — Ht 72.0 in | Wt 193.0 lb

## 2014-10-07 DIAGNOSIS — Z713 Dietary counseling and surveillance: Secondary | ICD-10-CM | POA: Insufficient documentation

## 2014-10-07 DIAGNOSIS — E1165 Type 2 diabetes mellitus with hyperglycemia: Secondary | ICD-10-CM

## 2014-10-07 DIAGNOSIS — E118 Type 2 diabetes mellitus with unspecified complications: Secondary | ICD-10-CM | POA: Insufficient documentation

## 2014-10-07 DIAGNOSIS — IMO0002 Reserved for concepts with insufficient information to code with codable children: Secondary | ICD-10-CM

## 2014-10-07 NOTE — Progress Notes (Signed)
  Medical Nutrition Therapy:  Appt start time: 1300 end time: 1330  Assessment:  Primary concerns today: Diabetes.   He has changed by eating more lower carb vegetables, cutting out snacks between meals and starting to exercise. Now eating whole wheat bread. His wife is here today with him. Checked his blood sugar before dinner was 110 mg/dl and 2 hours after dinner was 170 mg/dl. Still taking Metformin BID. Cut out sweets and junk food and late night snacks. Drinking only water.   Preferred Learning Style:   Auditory  Hands on  Learning Readiness:   Ready  Change in progress  MEDICATIONS: see list   DIETARY INTAKE:  24-hr recall:  B ( AM): Oatmeal, 1% milk, 1/2 banana Snk ( AM): none  L ( PM): 1/2 to whole sandwich, grapes,  water Snk ( PM): none D ( PM): chicken, turnip greens, 15 grapes, water Snk ( PM): cheese crackers sometimes Beverages: water   Usual physical activity: walking for about 1 mile daily. Staring to workout at the gym again this week.  Estimated energy needs: 1800 calories 200 g carbohydrates 135 g protein 50 g fat  Progress Towards Goal(s):  In progress.   Nutritional Diagnosis:  Nutrition and diabetes knowledge deficit As related to diabetes As evidenced by A1C of 8%.    Intervention:  Nutrition counseling and diabetes education. Plan:  Aim for 3-4 Carb Choices per meal (45-60  grams) +/- 1 either way  Avoid snacks between meals Cut out nabs between meals. Include protein in moderation with your meals  Consider reading food labels for Total Carbohydrate  Consider  increasing your activity level by 30 for 60 minutes daily as tolerated to a goal of walking 2 miles per day. Consider checking BG before and 2 hrs after largest meal per day once a week til next visit. Continue taking metformin as directed by MD  Goal: Lose 1/2 - 1 lb per week. 2. Follow Plate Method 3. No snacks between meals unless blood sugar low. 4. Get A1C down to 7% in three  months.  Teaching Method Utilized:  Visual Auditory Hands on  Handouts given during visit include: Carb Counting and Food Label handouts Meal Plan Card My Plate  Barriers to learning/adherence to lifestyle change:vision  Demonstrated degree of understanding via:  Teach Back   Monitoring/Evaluation:  Dietary intake, exercise, meal planning, SBG , and body weight in 3 month(s).

## 2014-10-07 NOTE — Patient Instructions (Signed)
Aim for 3-4 Carb Choices per meal (45-60  grams) +/- 1 either way  Avoid snacks between meals Cut out eating nabs unless needed for a low blood sugar between meals. Include protein in moderation with your meals  Consider reading food labels for Total Carbohydrate  Consider  increasing your activity level by 30 for 60 minutes daily as tolerated to a goal of walking 2 miles per day. Consider checking BG before and 2 hrs after largest meal per day once a week til next visit. Continue taking metformin as directed by MD  Goal: Lose 1/2 - 1 lb per week. 2. Follow Plate Method 3. No snacks between meals unless blood sugar low. 4. Get A1C down to 7% in three months.

## 2014-10-14 DIAGNOSIS — H2513 Age-related nuclear cataract, bilateral: Secondary | ICD-10-CM | POA: Diagnosis not present

## 2014-10-14 DIAGNOSIS — E119 Type 2 diabetes mellitus without complications: Secondary | ICD-10-CM | POA: Diagnosis not present

## 2014-10-14 DIAGNOSIS — E1129 Type 2 diabetes mellitus with other diabetic kidney complication: Secondary | ICD-10-CM | POA: Diagnosis not present

## 2014-10-14 DIAGNOSIS — H3531 Nonexudative age-related macular degeneration: Secondary | ICD-10-CM | POA: Diagnosis not present

## 2014-11-10 DIAGNOSIS — E1122 Type 2 diabetes mellitus with diabetic chronic kidney disease: Secondary | ICD-10-CM | POA: Diagnosis not present

## 2014-11-10 DIAGNOSIS — E785 Hyperlipidemia, unspecified: Secondary | ICD-10-CM | POA: Diagnosis not present

## 2014-11-10 DIAGNOSIS — E559 Vitamin D deficiency, unspecified: Secondary | ICD-10-CM | POA: Diagnosis not present

## 2014-11-10 DIAGNOSIS — I10 Essential (primary) hypertension: Secondary | ICD-10-CM | POA: Diagnosis not present

## 2014-11-17 DIAGNOSIS — I10 Essential (primary) hypertension: Secondary | ICD-10-CM | POA: Diagnosis not present

## 2014-11-17 DIAGNOSIS — E782 Mixed hyperlipidemia: Secondary | ICD-10-CM | POA: Diagnosis not present

## 2014-11-17 DIAGNOSIS — E1122 Type 2 diabetes mellitus with diabetic chronic kidney disease: Secondary | ICD-10-CM | POA: Diagnosis not present

## 2014-11-23 DIAGNOSIS — E1142 Type 2 diabetes mellitus with diabetic polyneuropathy: Secondary | ICD-10-CM | POA: Diagnosis not present

## 2014-11-23 DIAGNOSIS — B351 Tinea unguium: Secondary | ICD-10-CM | POA: Diagnosis not present

## 2014-12-06 DIAGNOSIS — N183 Chronic kidney disease, stage 3 (moderate): Secondary | ICD-10-CM | POA: Diagnosis not present

## 2014-12-06 DIAGNOSIS — Z79899 Other long term (current) drug therapy: Secondary | ICD-10-CM | POA: Diagnosis not present

## 2014-12-06 DIAGNOSIS — E1129 Type 2 diabetes mellitus with other diabetic kidney complication: Secondary | ICD-10-CM | POA: Diagnosis not present

## 2014-12-16 ENCOUNTER — Encounter (HOSPITAL_COMMUNITY): Payer: Self-pay | Admitting: Surgery

## 2015-01-07 ENCOUNTER — Encounter: Payer: Medicare Other | Attending: "Endocrinology | Admitting: Nutrition

## 2015-01-07 ENCOUNTER — Encounter: Payer: Self-pay | Admitting: Nutrition

## 2015-01-07 VITALS — Ht 72.0 in | Wt 196.0 lb

## 2015-01-07 DIAGNOSIS — Z713 Dietary counseling and surveillance: Secondary | ICD-10-CM | POA: Insufficient documentation

## 2015-01-07 DIAGNOSIS — E118 Type 2 diabetes mellitus with unspecified complications: Secondary | ICD-10-CM | POA: Insufficient documentation

## 2015-01-07 DIAGNOSIS — Z6828 Body mass index (BMI) 28.0-28.9, adult: Secondary | ICD-10-CM | POA: Diagnosis not present

## 2015-01-07 DIAGNOSIS — D649 Anemia, unspecified: Secondary | ICD-10-CM | POA: Diagnosis not present

## 2015-01-07 DIAGNOSIS — Z794 Long term (current) use of insulin: Secondary | ICD-10-CM | POA: Diagnosis not present

## 2015-01-07 DIAGNOSIS — E663 Overweight: Secondary | ICD-10-CM | POA: Diagnosis not present

## 2015-01-07 DIAGNOSIS — K635 Polyp of colon: Secondary | ICD-10-CM | POA: Diagnosis not present

## 2015-01-07 DIAGNOSIS — E1165 Type 2 diabetes mellitus with hyperglycemia: Secondary | ICD-10-CM

## 2015-01-07 DIAGNOSIS — IMO0002 Reserved for concepts with insufficient information to code with codable children: Secondary | ICD-10-CM

## 2015-01-07 NOTE — Progress Notes (Signed)
  Medical Nutrition Therapy:  Appt start time: 1400 end time: 1430  Assessment:  Primary concerns today: Diabetes. Follow up DM.  He is here with his wife today. Hasn't been able to exercise as much as he would like too due to family issues. He is taking 20 units of Lantus daily. 7 day avg was 152 mg/dl and 14 day avg 131 mg/dl. Last A1C was > 8%. Scheduled to have A1C done again in March 2016 with Dr. Dorris Fetch. BS have improved since he got started on Lantus.     Diet recall reveals he sometimes gets more carbs that he needs and that reflects higher blood sugars occasionally.   Preferred Learning Style:   Auditory  Hands on  Learning Readiness:   Ready  Change in progress  MEDICATIONS: see list   DIETARY INTAKE:  24-hr recall:  B ( AM): CHerrios, Bananas 1/2, 1% milk, Snk ( AM): none  L ( PM): 1/2 apple, 1/2 banana sandwich, grapes 5, water Snk ( PM): none D ( PM): Blackeyed peas, mac/cheese and okra, roll, water, fruit Snk ( PM): Gram crackers Beverages: water   Usual physical activity: walking for about 1 mile daily. Staring to workout at the gym again this week.  Estimated energy needs: 1800 calories 200 g carbohydrates 135 g protein 50 g fat  Progress Towards Goal(s):  In progress.   Nutritional Diagnosis:  Nutrition and diabetes knowledge deficit As related to diabetes As evidenced by A1C of 8%.    Intervention:  Nutrition counseling and diabetes education on CHO counting, meal planning and the need for increased physical activity. Plan:  Aim for 3 carb choices per meal Increase walking 30-45 minutes a day for desired weight loss Increase fresh fresh vegetables and fresh fruit Continue to drink lots of water Goal; Get A1C 7% to 3 months. 2. Lose 2-4 lbs in the next three months.  Teaching Method Utilized:  Visual Auditory Hands on  Handouts given during visit include: My Plate  Barriers to learning/adherence to lifestyle change:vision  Demonstrated  degree of understanding via:  Teach Back   Monitoring/Evaluation:  Dietary intake, exercise, meal planning, SBG , and body weight in 3 month(s).

## 2015-01-07 NOTE — Patient Instructions (Signed)
Plan:  Aim for 3 carb choices per meal Increase walking 30-45 minutes a day for desired weight loss Increase fresh fresh vegetables and fresh fruit Continue to drink lots of water Goal; Get A1C 7% to 3 months. 2. Lose 2-4 lbs in the next three months.

## 2015-01-27 ENCOUNTER — Ambulatory Visit (INDEPENDENT_AMBULATORY_CARE_PROVIDER_SITE_OTHER): Payer: Medicare Other | Admitting: Nurse Practitioner

## 2015-01-27 ENCOUNTER — Other Ambulatory Visit: Payer: Self-pay

## 2015-01-27 ENCOUNTER — Encounter: Payer: Self-pay | Admitting: Nurse Practitioner

## 2015-01-27 DIAGNOSIS — D649 Anemia, unspecified: Secondary | ICD-10-CM | POA: Diagnosis not present

## 2015-01-27 DIAGNOSIS — Z86010 Personal history of colonic polyps: Secondary | ICD-10-CM

## 2015-01-27 MED ORDER — PEG 3350-KCL-NA BICARB-NACL 420 G PO SOLR: 4000.0000 mL | ORAL | Status: DC

## 2015-01-27 NOTE — Assessment & Plan Note (Signed)
Slowly declining mild anemia per PCP notes and patient. Last hgb at PCP 10.6 (per patient) and 11.7 1 year ago in [place]. Patient with colonoscopy 3 years ago, about due for 3 year repeat for multiple tubular adenomas. Asymptomatic for anemia. No overt GI symptoms, no overt GI bleed. Will proceed with colonoscopy for surveillance and evaluation of GI cause of anemia. Patient with possible referral to hematology per PCP.  Proceed with TCS with Dr. Gala Romney in near future: the risks, benefits, and alternatives have been discussed with the patient in detail. The patient states understanding and desires to proceed.

## 2015-01-27 NOTE — Patient Instructions (Signed)
1. Have your labs drawn in the next day or two. 2. We will schedule your colonoscopy for you with Dr. Gala Romney 3. The night before your procedure, only take half your regular diabetes medicines (Lantus and metformin) and the morning of, do not take any of your diabetes medicine. 4. Further recommendations to be based on the results of your procedure.

## 2015-01-27 NOTE — Progress Notes (Signed)
Primary Care Physician:  Rocky Morel, MD Primary Gastroenterologist:  Dr. Gala Romney  Chief Complaint  Patient presents with  . OTHER    needs colonosopy/ some anemia problems    HPI:   81 year old male presents on referral from PCP for anemia and history of multiple adenomas. Last Colonosocopy on 02/25/12 with adequate preparation, 4 mm rectal polyp, right sided diverticula, numerous 5-6 mm polyps in descending and ascending colon segments. Path shows descending colon with tubular adenomas (2 pieces), ascending colon with tubular adenomas (2 pieces), and rectal polyps with hyperplastic polyps without adenomatous changes of malignancy. Recommended repeat colonoscopy not found, but guidelines support 3 years for 3-10 tubular adenomas. Stated anemia per referring provider but no labs included in notes, last CBC in Epic is 10/27/13.  Today states he is about due to for a repeat colonoscopy. Was told by PCP his Hgb has been trending down. Per the patient her Hgb is 10.7 (10/26/13 in Epic 11.7.) Denies abdominal pain, hematochezia, or melena. Denies dyspepsia symptoms, is on Zantac bid. Denies dysphagia. Denies any other upper or lower GI symptoms.   Past Medical History  Diagnosis Date  . Macular degeneration 09-09-13    bilateral"vision is poor"  . Diabetes mellitus   . Hypertension   . GERD (gastroesophageal reflux disease)   . Gout 09-09-13    last flare 8'14 with [facility]bladder attack at [facility]  . Arthritis   . Cancer     skin cancer of forehead  . Hyperlipidemia   . Erectile dysfunction   . RBBB   . CAD (coronary artery disease)   . Pancreatitis 10--8-14    8'14 -enzymes improved  . Psoriasis 09-09-13    elbows, knees    Past Surgical History  Procedure Laterality Date  . Coronary artery bypass graft  02-28-2011    LIMA to LAD,SVG to acute marginal branch of RCA & sequential SVG to OM1 & OM2.  . Pilonidal cyst excision    . Colonoscopy  02/25/2012    Procedure:  COLONOSCOPY;  Surgeon: Daneil Dolin, MD;  [facility];  Service: Endoscopy;  Laterality: N/A;  7:30 AM polyps removed.  . US echocardiography  11/09/2008    mildly impaired diastolic dysfunction - Grade I  . Nm myocar perf wall motion  02/15/2011    High Risk  . Cardiac catheterization  02/19/2011    3 vessel CAD  . Cholecystectomy N/A 10/27/2013    Procedure: LAPAROSCOPIC CHOLECYSTECTOMY WITH INTRAOPERATIVE CHOLANGIOGRAM WITH LIVER BIOPSY;  Surgeon: Pedro Earls, MD;  Location: [facility];  Service: General;  Laterality: N/A;    Current Outpatient Prescriptions  Medication Sig Dispense Refill  . allopurinol (ZYLOPRIM) 100 MG tablet Take 100 mg by mouth daily with breakfast.     . aspirin 325 MG tablet Take 325 mg by mouth daily.    Marland Kitchen atorvastatin (LIPITOR) 40 MG tablet Take 40 mg by mouth every evening.     . carvedilol (COREG) 12.5 MG tablet Take 12.5 mg by mouth 2 (two) times daily with a meal.    . escitalopram (LEXAPRO) 10 MG tablet Take 10 mg by mouth daily.    . insulin glargine (LANTUS) 100 UNIT/ML injection Inject 20 Units into the skin at bedtime.    . metFORMIN (GLUCOPHAGE) 500 MG tablet Take 500 mg by mouth 2 (two) times daily.     . Misc Natural Products (TART CHERRY ADVANCED PO) Take 1,200 mg by mouth daily.    Marland Kitchen  Multiple Vitamins-Iron (MULTIVITAMINS WITH IRON) TABS Take 1 tablet by mouth daily.    . naproxen sodium (ANAPROX) 220 MG tablet Take 220 mg by mouth daily as needed (headache).    . quinapril (ACCUPRIL) 20 MG tablet Take 20 mg by mouth every morning.     . ranitidine (ZANTAC) 150 MG tablet Take 150 mg by mouth 2 (two) times daily.    . Multiple Vitamins-Minerals (PRESERVISION AREDS PO) Take 1 capsule by mouth daily.      No current facility-administered medications for this visit.    Allergies as of 01/27/2015 - Review Complete 01/27/2015  Allergen Reaction Noted  . Amiodarone Nausea Only 07/23/2013    Family History  Problem Relation Age of  Onset  . Anesthesia problems Neg Hx   . Hypotension Neg Hx   . Malignant hyperthermia Neg Hx   . Pseudochol deficiency Neg Hx   . Stroke Father   . Heart failure Brother   . Cancer Sister     pancreas & liver & liver  . Hypertension Sister   . Cancer Sister     breast  . Heart failure Sister   . Cancer Mother     leukemia    History   Social History  . Marital Status: Married    Spouse Name: N/A  . Number of Children: N/A  . Years of Education: N/A   Occupational History  . Not on file.   Social History Main Topics  . Smoking status: Former Smoker -- 0.50 packs/day for 25 years    Types: Cigarettes    Quit date: 02/25/1983  . Smokeless tobacco: Never Used  . Alcohol Use: Yes     Comment: occasional  . Drug Use: No  . Sexual Activity: No   Other Topics Concern  . Not on file   Social History Narrative    Review of Systems: Gen: Denies any fever, chills, fatigue, weight loss, lack of appetite.  CV: Denies chest pain, heart palpitations, peripheral edema, syncope.  Resp: Denies shortness of breath at rest or with exertion. Denies wheezing. Denies difficulty breathing while laying down.  GI: See HPI. Denies dysphagia or odynophagia. MS: Denies joint pain, muscle weakness, cramps, or limitation of movement.  Derm: Denies rash, itching, dry skin Psych: Denies depression, anxiety, memory loss, and confusion Heme: Denies bruising, bleeding, and enlarged lymph nodes.  Physical Exam: There were no vitals taken for this visit. General:   Alert and oriented. Pleasant and cooperative. Well-nourished and well-developed.  Head:  Normocephalic and atraumatic. Mouth:  No deformity or lesions, oral mucosa pink. No OP edema. Lungs:  Clear to auscultation bilaterally. No wheezes, rales, or rhonchi. No distress.  Heart:  S1, S2 present without murmurs appreciated.  Abdomen:  +BS, soft, non-tender and non-distended. No HSM noted. No guarding or rebound. No masses appreciated.    Rectal:  Deferred  Extremities:  Without clubbing or edema. Neurologic:  Alert and  oriented x4;  grossly normal neurologically. Skin:  Intact without significant lesions or rashes. Psych:  Alert and cooperative. Normal mood and affect.     01/27/2015 2:23 PM

## 2015-01-27 NOTE — Assessment & Plan Note (Signed)
Last colonoscopy about 3 years ago with multiple (4+) tubular adenomas recommended 3 year repeat surveilance. About due for repeat, also having anemia per PCP. No red flag/warning signs (palloor, syncope, overt GI bleed, etc). Will proceed with TCS for surveillance and to evaluate for possible GI etiology of mild anemia.  Proceed with TCS with Dr. Gala Romney in near future: the risks, benefits, and alternatives have been discussed with the patient in detail. The patient states understanding and desires to proceed.

## 2015-01-31 DIAGNOSIS — D649 Anemia, unspecified: Secondary | ICD-10-CM | POA: Diagnosis not present

## 2015-01-31 LAB — CBC WITH DIFFERENTIAL/PLATELET
BASOS PCT: 0 % (ref 0–1)
Basophils Absolute: 0 10*3/uL (ref 0.0–0.1)
Eosinophils Absolute: 0.4 10*3/uL (ref 0.0–0.7)
Eosinophils Relative: 7 % — ABNORMAL HIGH (ref 0–5)
HEMATOCRIT: 35 % — AB (ref 39.0–52.0)
Hemoglobin: 11.4 g/dL — ABNORMAL LOW (ref 13.0–17.0)
Lymphocytes Relative: 24 % (ref 12–46)
Lymphs Abs: 1.3 10*3/uL (ref 0.7–4.0)
MCH: 30.2 pg (ref 26.0–34.0)
MCHC: 32.6 g/dL (ref 30.0–36.0)
MCV: 92.8 fL (ref 78.0–100.0)
MONO ABS: 0.4 10*3/uL (ref 0.1–1.0)
MPV: 11.2 fL (ref 8.6–12.4)
Monocytes Relative: 7 % (ref 3–12)
NEUTROS ABS: 3.4 10*3/uL (ref 1.7–7.7)
Neutrophils Relative %: 62 % (ref 43–77)
Platelets: 150 10*3/uL (ref 150–400)
RBC: 3.77 MIL/uL — ABNORMAL LOW (ref 4.22–5.81)
RDW: 14 % (ref 11.5–15.5)
WBC: 5.5 10*3/uL (ref 4.0–10.5)

## 2015-02-01 DIAGNOSIS — E1142 Type 2 diabetes mellitus with diabetic polyneuropathy: Secondary | ICD-10-CM | POA: Diagnosis not present

## 2015-02-01 DIAGNOSIS — B351 Tinea unguium: Secondary | ICD-10-CM | POA: Diagnosis not present

## 2015-02-01 NOTE — Progress Notes (Signed)
CC'ED TO PCP 

## 2015-02-11 ENCOUNTER — Encounter (HOSPITAL_COMMUNITY): Payer: Self-pay | Admitting: *Deleted

## 2015-02-11 ENCOUNTER — Ambulatory Visit (HOSPITAL_COMMUNITY)
Admission: RE | Admit: 2015-02-11 | Discharge: 2015-02-11 | Disposition: A | Discharge status: Home / Self Care | Payer: Medicare Other | Source: Ambulatory Visit | Attending: Internal Medicine | Admitting: Internal Medicine

## 2015-02-11 ENCOUNTER — Encounter (HOSPITAL_COMMUNITY)
Admission: RE | Disposition: A | Discharge status: Home / Self Care | Payer: Self-pay | Source: Ambulatory Visit | Attending: Internal Medicine

## 2015-02-11 DIAGNOSIS — E1122 Type 2 diabetes mellitus with diabetic chronic kidney disease: Secondary | ICD-10-CM | POA: Diagnosis not present

## 2015-02-11 DIAGNOSIS — M1990 Unspecified osteoarthritis, unspecified site: Secondary | ICD-10-CM | POA: Insufficient documentation

## 2015-02-11 DIAGNOSIS — Z713 Dietary counseling and surveillance: Secondary | ICD-10-CM | POA: Diagnosis not present

## 2015-02-11 DIAGNOSIS — Z860100 Personal history of colon polyps, unspecified: Secondary | ICD-10-CM | POA: Insufficient documentation

## 2015-02-11 DIAGNOSIS — Z8249 Family history of ischemic heart disease and other diseases of the circulatory system: Secondary | ICD-10-CM | POA: Insufficient documentation

## 2015-02-11 DIAGNOSIS — M109 Gout, unspecified: Secondary | ICD-10-CM | POA: Insufficient documentation

## 2015-02-11 DIAGNOSIS — Z794 Long term (current) use of insulin: Secondary | ICD-10-CM | POA: Diagnosis not present

## 2015-02-11 DIAGNOSIS — Z87891 Personal history of nicotine dependence: Secondary | ICD-10-CM | POA: Diagnosis not present

## 2015-02-11 DIAGNOSIS — D123 Benign neoplasm of transverse colon: Secondary | ICD-10-CM | POA: Diagnosis not present

## 2015-02-11 DIAGNOSIS — H3530 Unspecified macular degeneration: Secondary | ICD-10-CM | POA: Diagnosis not present

## 2015-02-11 DIAGNOSIS — K219 Gastro-esophageal reflux disease without esophagitis: Secondary | ICD-10-CM | POA: Diagnosis not present

## 2015-02-11 DIAGNOSIS — Z7982 Long term (current) use of aspirin: Secondary | ICD-10-CM | POA: Diagnosis not present

## 2015-02-11 DIAGNOSIS — Z85828 Personal history of other malignant neoplasm of skin: Secondary | ICD-10-CM | POA: Diagnosis not present

## 2015-02-11 DIAGNOSIS — Z791 Long term (current) use of non-steroidal anti-inflammatories (NSAID): Secondary | ICD-10-CM | POA: Diagnosis not present

## 2015-02-11 DIAGNOSIS — E119 Type 2 diabetes mellitus without complications: Secondary | ICD-10-CM | POA: Insufficient documentation

## 2015-02-11 DIAGNOSIS — Z79899 Other long term (current) drug therapy: Secondary | ICD-10-CM | POA: Diagnosis not present

## 2015-02-11 DIAGNOSIS — N529 Male erectile dysfunction, unspecified: Secondary | ICD-10-CM | POA: Insufficient documentation

## 2015-02-11 DIAGNOSIS — E782 Mixed hyperlipidemia: Secondary | ICD-10-CM | POA: Diagnosis not present

## 2015-02-11 DIAGNOSIS — I10 Essential (primary) hypertension: Secondary | ICD-10-CM | POA: Diagnosis not present

## 2015-02-11 DIAGNOSIS — I2510 Atherosclerotic heart disease of native coronary artery without angina pectoris: Secondary | ICD-10-CM | POA: Diagnosis not present

## 2015-02-11 HISTORY — PX: COLONOSCOPY: SHX5424

## 2015-02-11 LAB — GLUCOSE, CAPILLARY
GLUCOSE-CAPILLARY: 45 mg/dL — AB (ref 70–99)
GLUCOSE-CAPILLARY: 98 mg/dL (ref 70–99)

## 2015-02-11 SURGERY — COLONOSCOPY
Anesthesia: Moderate Sedation

## 2015-02-11 MED ORDER — MIDAZOLAM HCL 5 MG/5ML IJ SOLN
INTRAMUSCULAR | Status: DC | PRN
  Administered 2015-02-11: 2 mg via INTRAVENOUS
  Administered 2015-02-11: 1 mg via INTRAVENOUS

## 2015-02-11 MED ORDER — MIDAZOLAM HCL 5 MG/5ML IJ SOLN
INTRAMUSCULAR | Status: AC
  Filled 2015-02-11: qty 10

## 2015-02-11 MED ORDER — STERILE WATER FOR IRRIGATION IR SOLN
Status: DC | PRN
  Administered 2015-02-11: 09:00:00

## 2015-02-11 MED ORDER — DEXTROSE 50 % IV SOLN
INTRAVENOUS | Status: DC | PRN
  Administered 2015-02-11: 25 mL via INTRAVENOUS

## 2015-02-11 MED ORDER — MEPERIDINE HCL 100 MG/ML IJ SOLN
INTRAMUSCULAR | Status: DC | PRN
  Administered 2015-02-11: 25 mg via INTRAVENOUS
  Administered 2015-02-11: 50 mg via INTRAVENOUS

## 2015-02-11 MED ORDER — DEXTROSE 50 % IV SOLN
INTRAVENOUS | Status: AC
  Filled 2015-02-11: qty 50

## 2015-02-11 MED ORDER — ONDANSETRON HCL 4 MG/2ML IJ SOLN
INTRAMUSCULAR | Status: DC | PRN
  Administered 2015-02-11: 4 mg via INTRAVENOUS

## 2015-02-11 MED ORDER — SODIUM CHLORIDE 0.9 % IV SOLN
INTRAVENOUS | Status: DC
  Administered 2015-02-11: 08:00:00 via INTRAVENOUS

## 2015-02-11 MED ORDER — MEPERIDINE HCL 100 MG/ML IJ SOLN
INTRAMUSCULAR | Status: AC
  Filled 2015-02-11: qty 2

## 2015-02-11 MED ORDER — ONDANSETRON HCL 4 MG/2ML IJ SOLN
INTRAMUSCULAR | Status: AC
  Filled 2015-02-11: qty 2

## 2015-02-11 NOTE — Discharge Instructions (Signed)
Colon Polyp information provided MD Will contact with pathology report    Colonoscopy, Care After These instructions give you information on caring for yourself after your procedure. Your doctor may also give you more specific instructions. Call your doctor if you have any problems or questions after your procedure. HOME CARE  Do not drive for 24 hours.  Do not sign important papers or use machinery for 24 hours.  You may shower.  You may go back to your usual activities, but go slower for the first 24 hours.  Take rest breaks often during the first 24 hours.  Walk around or use warm packs on your belly (abdomen) if you have belly cramping or gas.  Drink enough fluids to keep your pee (urine) clear or pale yellow.  Resume your normal diet. Avoid heavy or fried foods.  Avoid drinking alcohol for 24 hours or as told by your doctor.  Only take medicines as told by your doctor. If a tissue sample (biopsy) was taken during the procedure:   Do not take aspirin or blood thinners for 7 days, or as told by your doctor.  Do not drink alcohol for 7 days, or as told by your doctor.  Eat soft foods for the first 24 hours. GET HELP IF: You still have a small amount of blood in your poop (stool) 2-3 days after the procedure. GET HELP RIGHT AWAY IF:  You have more than a small amount of blood in your poop.  You see clumps of tissue (blood clots) in your poop.  Your belly is puffy (swollen).  You feel sick to your stomach (nauseous) or throw up (vomit).  You have a fever.  You have belly pain that gets worse and medicine does not help. MAKE SURE YOU:  Understand these instructions.  Will watch your condition.  Will get help right away if you are not doing well or get worse. Document Released: 12/22/2010 Document Revised: 11/24/2013 Document Reviewed: 07/27/2013 [facility] Patient Information 2015 [place], [place]. This information is not intended to replace advice given to you by  your health care provider. Make sure you discuss any questions you have with your health care provider.   Colon Polyps Polyps are lumps of extra tissue growing inside the body. Polyps can grow in the large intestine (colon). Most colon polyps are noncancerous (benign). However, some colon polyps can become cancerous over time. Polyps that are larger than a pea may be harmful. To be safe, caregivers remove and test all polyps. CAUSES  Polyps form when mutations in the genes cause your cells to grow and divide even though no more tissue is needed. RISK FACTORS There are a number of risk factors that can increase your chances of getting colon polyps. They include:  Being older than 50 years.  Family history of colon polyps or colon cancer.  Long-term colon diseases, such as colitis or Crohn disease.  Being overweight.  Smoking.  Being inactive.  Drinking too much alcohol. SYMPTOMS  Most small polyps do not cause symptoms. If symptoms are present, they may include:  Blood in the stool. The stool may look dark red or black.  Constipation or diarrhea that lasts longer than 1 week. DIAGNOSIS People often do not know they have polyps until their caregiver finds them during a regular checkup. Your caregiver can use 4 tests to check for polyps:  Digital rectal exam. The caregiver wears gloves and feels inside the rectum. This test would find polyps only in the rectum.  Barium enema. The caregiver puts a liquid called barium into your rectum before taking X-rays of your colon. Barium makes your colon look white. Polyps are dark, so they are easy to see in the X-ray pictures.  Sigmoidoscopy. A thin, flexible tube (sigmoidoscope) is placed into your rectum. The sigmoidoscope has a light and tiny camera in it. The caregiver uses the sigmoidoscope to look at the last third of your colon.  Colonoscopy. This test is like sigmoidoscopy, but the caregiver looks at the entire colon. This is the  most common method for finding and removing polyps. TREATMENT  Any polyps will be removed during a sigmoidoscopy or colonoscopy. The polyps are then tested for cancer. PREVENTION  To help lower your risk of getting more colon polyps:  Eat plenty of fruits and vegetables. Avoid eating fatty foods.  Do not smoke.  Avoid drinking alcohol.  Exercise every day.  Lose weight if recommended by your caregiver.  Eat plenty of calcium and folate. Foods that are rich in calcium include milk, cheese, and broccoli. Foods that are rich in folate include chickpeas, kidney beans, and spinach. HOME CARE INSTRUCTIONS Keep all follow-up appointments as directed by your caregiver. You may need periodic exams to check for polyps. SEEK MEDICAL CARE IF: You notice bleeding during a bowel movement. Document Released: 08/15/2004 Document Revised: 02/11/2012 Document Reviewed: 01/29/2012 [facility] Patient Information 2015 [place], [place]. This information is not intended to replace advice given to you by your health care provider. Make sure you discuss any questions you have with your health care provider.

## 2015-02-11 NOTE — Progress Notes (Signed)
Hypoglycemic Event  CBG: 45  Treatment: D50 IV 25 mL per Dr. Roseanne Kaufman order  Symptoms: None  Follow-up CBG: [id number] CBG Result:98  Possible Reasons for Event: NPO due to colonoscopy prep  Comments/MD notified:Dr. Gwinda [place], Makaylee Spielberg Renae

## 2015-02-11 NOTE — H&P (View-Only) (Signed)
Primary Care Physician:  Rocky Morel, MD Primary Gastroenterologist:  Dr. Gala Romney  Chief Complaint  Patient presents with  . OTHER    needs colonosopy/ some anemia problems    HPI:   81 year old male presents on referral from PCP for anemia and history of multiple adenomas. Last Colonosocopy on 02/25/12 with adequate preparation, 4 mm rectal polyp, right sided diverticula, numerous 5-6 mm polyps in descending and ascending colon segments. Path shows descending colon with tubular adenomas (2 pieces), ascending colon with tubular adenomas (2 pieces), and rectal polyps with hyperplastic polyps without adenomatous changes of malignancy. Recommended repeat colonoscopy not found, but guidelines support 3 years for 3-10 tubular adenomas. Stated anemia per referring provider but no labs included in notes, last CBC in Epic is 10/27/13.  Today states he is about due to for a repeat colonoscopy. Was told by PCP his Hgb has been trending down. Per the patient her Hgb is 10.7 (10/26/13 in Epic 11.7.) Denies abdominal pain, hematochezia, or melena. Denies dyspepsia symptoms, is on Zantac bid. Denies dysphagia. Denies any other upper or lower GI symptoms.   Past Medical History  Diagnosis Date  . Macular degeneration 09-09-13    bilateral"vision is poor"  . Diabetes mellitus   . Hypertension   . GERD (gastroesophageal reflux disease)   . Gout 09-09-13    last flare 8'14 with gallbladder attack at [facility]  . Arthritis   . Cancer     skin cancer of forehead  . Hyperlipidemia   . Erectile dysfunction   . RBBB   . CAD (coronary artery disease)   . Pancreatitis 10--8-14    8'14 -enzymes improved  . Psoriasis 09-09-13    elbows, knees    Past Surgical History  Procedure Laterality Date  . Coronary artery bypass graft  02-28-2011    LIMA to LAD,SVG to acute marginal branch of RCA & sequential SVG to OM1 & OM2.  . Pilonidal cyst excision    . Colonoscopy  02/25/2012    Procedure:  COLONOSCOPY;  Surgeon: Daneil Dolin, MD;  [facility];  Service: Endoscopy;  Laterality: N/A;  7:30 AM polyps removed.  . US echocardiography  11/09/2008    mildly impaired diastolic dysfunction - Grade I  . Nm myocar perf wall motion  02/15/2011    High Risk  . Cardiac catheterization  02/19/2011    3 vessel CAD  . Cholecystectomy N/A 10/27/2013    Procedure: LAPAROSCOPIC CHOLECYSTECTOMY WITH INTRAOPERATIVE CHOLANGIOGRAM WITH LIVER BIOPSY;  Surgeon: Pedro Earls, MD;  Location: [facility];  Service: General;  Laterality: N/A;    Current Outpatient Prescriptions  Medication Sig Dispense Refill  . allopurinol (ZYLOPRIM) 100 MG tablet Take 100 mg by mouth daily with breakfast.     . aspirin 325 MG tablet Take 325 mg by mouth daily.    Marland Kitchen atorvastatin (LIPITOR) 40 MG tablet Take 40 mg by mouth every evening.     . carvedilol (COREG) 12.5 MG tablet Take 12.5 mg by mouth 2 (two) times daily with a meal.    . escitalopram (LEXAPRO) 10 MG tablet Take 10 mg by mouth daily.    . insulin glargine (LANTUS) 100 UNIT/ML injection Inject 20 Units into the skin at bedtime.    . metFORMIN (GLUCOPHAGE) 500 MG tablet Take 500 mg by mouth 2 (two) times daily.     . Misc Natural Products (TART CHERRY ADVANCED PO) Take 1,200 mg by mouth daily.    Marland Kitchen  Multiple Vitamins-Iron (MULTIVITAMINS WITH IRON) TABS Take 1 tablet by mouth daily.    . naproxen sodium (ANAPROX) 220 MG tablet Take 220 mg by mouth daily as needed (headache).    . quinapril (ACCUPRIL) 20 MG tablet Take 20 mg by mouth every morning.     . ranitidine (ZANTAC) 150 MG tablet Take 150 mg by mouth 2 (two) times daily.    . Multiple Vitamins-Minerals (PRESERVISION AREDS PO) Take 1 capsule by mouth daily.      No current facility-administered medications for this visit.    Allergies as of 01/27/2015 - Review Complete 01/27/2015  Allergen Reaction Noted  . Amiodarone Nausea Only 07/23/2013    Family History  Problem Relation Age of  Onset  . Anesthesia problems Neg Hx   . Hypotension Neg Hx   . Malignant hyperthermia Neg Hx   . Pseudochol deficiency Neg Hx   . Stroke Father   . Heart failure Brother   . Cancer Sister     pancreas & liver & liver  . Hypertension Sister   . Cancer Sister     breast  . Heart failure Sister   . Cancer Mother     leukemia    History   Social History  . Marital Status: Married    Spouse Name: N/A  . Number of Children: N/A  . Years of Education: N/A   Occupational History  . Not on file.   Social History Main Topics  . Smoking status: Former Smoker -- 0.50 packs/day for 25 years    Types: Cigarettes    Quit date: 02/25/1983  . Smokeless tobacco: Never Used  . Alcohol Use: Yes     Comment: occasional  . Drug Use: No  . Sexual Activity: No   Other Topics Concern  . Not on file   Social History Narrative    Review of Systems: Gen: Denies any fever, chills, fatigue, weight loss, lack of appetite.  CV: Denies chest pain, heart palpitations, peripheral edema, syncope.  Resp: Denies shortness of breath at rest or with exertion. Denies wheezing. Denies difficulty breathing while laying down.  GI: See HPI. Denies dysphagia or odynophagia. MS: Denies joint pain, muscle weakness, cramps, or limitation of movement.  Derm: Denies rash, itching, dry skin Psych: Denies depression, anxiety, memory loss, and confusion Heme: Denies bruising, bleeding, and enlarged lymph nodes.  Physical Exam: There were no vitals taken for this visit. General:   Alert and oriented. Pleasant and cooperative. Well-nourished and well-developed.  Head:  Normocephalic and atraumatic. Mouth:  No deformity or lesions, oral mucosa pink. No OP edema. Lungs:  Clear to auscultation bilaterally. No wheezes, rales, or rhonchi. No distress.  Heart:  S1, S2 present without murmurs appreciated.  Abdomen:  +BS, soft, non-tender and non-distended. No HSM noted. No guarding or rebound. No masses appreciated.    Rectal:  Deferred  Extremities:  Without clubbing or edema. Neurologic:  Alert and  oriented x4;  grossly normal neurologically. Skin:  Intact without significant lesions or rashes. Psych:  Alert and cooperative. Normal mood and affect.     01/27/2015 2:23 PM

## 2015-02-11 NOTE — Op Note (Signed)
[facility] [address] [place], [address]   COLONOSCOPY PROCEDURE REPORT  PATIENT: Fadil, Macmaster  MR#: [id number] BIRTHDATE: 27-Jun-1934 , 80  yrs. old GENDER: male ENDOSCOPIST: R.  Garfield Cornea, MD FACP [facility] REFERRED IE:PPIRJJ Ethlyn Gallery, M.D. PROCEDURE DATE:  2015-02-12 PROCEDURE:   Colonoscopy with snare polypectomy INDICATIONS:Surveillance examination; history of colonic adenoma. MEDICATIONS: Versed 3 mg IV and Demerol 75 mg IV in divided doses. Zofran 4 mg IV. ASA CLASS:       Class III  CONSENT: The risks, benefits, alternatives and imponderables including but not limited to bleeding, perforation as well as the possibility of a missed lesion have been reviewed.  The potential for biopsy, lesion removal, etc. have also been discussed. Questions have been answered.  All parties agreeable.  Please see the history and physical in the medical record for more information.  DESCRIPTION OF PROCEDURE:   After the risks benefits and alternatives of the procedure were thoroughly explained, informed consent was obtained.  The digital rectal exam revealed no abnormalities of the rectum.   The [id number] ([id number])  endoscope was introduced through the anus and advanced to the cecum, which was identified by both the appendix and ileocecal valve. No adverse events experienced.   The quality of the prep was adequate  The instrument was then slowly withdrawn as the colon was fully examined.      COLON FINDINGS: Normal-appearing rectal mucosa.  (3) 5 mm polyps in the vicinity of the hepatic flexure; the remainder of the colonic mucosa appeared normal aside from a solitary cecal diverticulum. The above-mentioned polyps were cold snared removed and recovered for the pathologist.  Retroflexion was performed. .  Withdrawal time=8 minutes 0 seconds.  The scope was withdrawn and the procedure completed. COMPLICATIONS: There were no immediate  complications.  ENDOSCOPIC IMPRESSION: Multiple colonic polyps?"removed as described above. However, I do not feel the small polyps removed todayhave anything to do with this anemia.  RECOMMENDATIONS: Follow up on pathology. If patient displays evidence of GI bleeding, further evaluation would be  warranted.  eSigned:  R. Garfield Cornea, MD Rosalita Chessman [facility] 02-12-2015 9:11 AM   cc:  CPT CODES: ICD CODES:  The ICD and CPT codes recommended by this software are interpretations from the data that the clinical staff has captured with the software.  The verification of the translation of this report to the ICD and CPT codes and modifiers is the sole responsibility of the health care institution and practicing physician where this report was generated.  [employer]. will not be held responsible for the validity of the ICD and CPT codes included on this report.  AMA assumes no liability for data contained or not contained herein. CPT is a Designer, television/film set of the [employer].

## 2015-02-11 NOTE — Interval H&P Note (Signed)
History and Physical Interval Note:  02/11/2015 8:36 AM  Grant [name]  has presented today for surgery, with the diagnosis of HISTORY OF COLON POLYPS  The various methods of treatment have been discussed with the patient and family. After consideration of risks, benefits and other options for treatment, the patient has consented to  Procedure(s) with comments: COLONOSCOPY (N/A) - 830  as a surgical intervention .  The patient's history has been reviewed, patient examined, no change in status, stable for surgery.  I have reviewed the patient's chart and labs.  Questions were answered to the patient's satisfaction.     Grant [name]  No change. Colonoscopy per plan.The risks, benefits, limitations, alternatives and imponderables have been reviewed with the patient. Questions have been answered. All parties are agreeable.

## 2015-02-16 ENCOUNTER — Encounter (HOSPITAL_COMMUNITY): Payer: Self-pay | Admitting: Internal Medicine

## 2015-02-17 ENCOUNTER — Encounter: Payer: Self-pay | Admitting: Internal Medicine

## 2015-02-17 ENCOUNTER — Telehealth: Payer: Self-pay

## 2015-02-17 ENCOUNTER — Other Ambulatory Visit: Payer: Self-pay

## 2015-02-17 DIAGNOSIS — D649 Anemia, unspecified: Secondary | ICD-10-CM

## 2015-02-17 NOTE — Telephone Encounter (Signed)
Per RMR-  Send letter to patient.  Send copy of letter with path to referring provider and PCP.   Patient needs a stool for sample for occult blood testing in about 2-3 weeks from now along with a CBC.

## 2015-02-17 NOTE — Telephone Encounter (Signed)
Letter mailed to the pt.  Lab order and ifobt ready to be mailed to the pt.

## 2015-02-18 DIAGNOSIS — I10 Essential (primary) hypertension: Secondary | ICD-10-CM | POA: Diagnosis not present

## 2015-02-18 DIAGNOSIS — E1165 Type 2 diabetes mellitus with hyperglycemia: Secondary | ICD-10-CM | POA: Diagnosis not present

## 2015-02-18 DIAGNOSIS — E782 Mixed hyperlipidemia: Secondary | ICD-10-CM | POA: Diagnosis not present

## 2015-04-07 ENCOUNTER — Encounter: Payer: Medicare Other | Attending: "Endocrinology | Admitting: Nutrition

## 2015-04-07 VITALS — Ht 70.0 in | Wt 197.0 lb

## 2015-04-07 DIAGNOSIS — IMO0002 Reserved for concepts with insufficient information to code with codable children: Secondary | ICD-10-CM

## 2015-04-07 DIAGNOSIS — E1165 Type 2 diabetes mellitus with hyperglycemia: Secondary | ICD-10-CM

## 2015-04-07 DIAGNOSIS — Z794 Long term (current) use of insulin: Secondary | ICD-10-CM | POA: Diagnosis not present

## 2015-04-07 DIAGNOSIS — Z713 Dietary counseling and surveillance: Secondary | ICD-10-CM | POA: Diagnosis not present

## 2015-04-07 DIAGNOSIS — E118 Type 2 diabetes mellitus with unspecified complications: Secondary | ICD-10-CM | POA: Diagnosis not present

## 2015-04-07 NOTE — Patient Instructions (Signed)
Goal 1.  Eat three meals per day. 2. Prevent low blood sugars. 3. Eat meals on time 4. Take Lantus as prescribed. 5. Call Dr. Dorris Fetch if you have blood sugars less than 70 or higher than 300. 6. Keep A1C 7% or lower.

## 2015-04-07 NOTE — Progress Notes (Signed)
  Medical Nutrition Therapy:  Appt start time: 1100  end time: 1115  MAssessment:  Primary concerns today: Diabetes. Follow up DM. A1C  7 % down from 8.3%. Got more serious with his diet. 15 units of Lantus.  Walking some now. Eating more regular balanced meals. Taking insulin on schedule. No low blood sugars. BS log reveals FBS 109-140 mg/dl. Visually impaired. Can use the insulin pen without any problems. He give his insulin in his stomach area and rotates the site. Feels good.  Preferred Learning Style:   Auditory  Hands on  Learning Readiness:   Ready  Change in progress  MEDICATIONS: see list   DIETARY INTAKE:  24-hr recall:  Eating three meals per day. More aware of carbs and getting in lots of fresh fruits and veggies. Drinking a lot more water and exercising now.  Usual physical activity: walking for about 1 mile daily.  Estimated energy needs: 1800 calories 200 g carbohydrates 135 g protein 50 g fat  Progress Towards Goal(s):  In progress.   Nutritional Diagnosis:  Nutrition and diabetes knowledge deficit As related to diabetes As evidenced by A1C of 8%.    Intervention:  Nutrition counseling and diabetes education on CHO counting, meal planning and the need for increased physical activity. Plan:   Goal 1.  Eat three meals per day with 45-60 g carbs per meal.. 2. Prevent low blood sugars. 3. Eat meals on time 4. Take Lantus as prescribed. 5. Call Dr. Dorris Fetch if you have blood sugars less than 70 or higher than 300. 6. Keep A1C 7% or lower without any low blood sugars.  Teaching Method Utilized:  Visual Auditory Hands on  Handouts given during visit include: My Plate  Barriers to learning/adherence to lifestyle change:vision  Demonstrated degree of understanding via:  Teach Back   Monitoring/Evaluation:  Dietary intake, exercise, meal planning, SBG , and body weight in 6 month(s).

## 2015-04-12 DIAGNOSIS — B351 Tinea unguium: Secondary | ICD-10-CM | POA: Diagnosis not present

## 2015-04-12 DIAGNOSIS — E1142 Type 2 diabetes mellitus with diabetic polyneuropathy: Secondary | ICD-10-CM | POA: Diagnosis not present

## 2015-05-16 DIAGNOSIS — E785 Hyperlipidemia, unspecified: Secondary | ICD-10-CM | POA: Diagnosis not present

## 2015-05-16 DIAGNOSIS — Z713 Dietary counseling and surveillance: Secondary | ICD-10-CM | POA: Diagnosis not present

## 2015-05-16 DIAGNOSIS — E1122 Type 2 diabetes mellitus with diabetic chronic kidney disease: Secondary | ICD-10-CM | POA: Diagnosis not present

## 2015-05-16 DIAGNOSIS — I10 Essential (primary) hypertension: Secondary | ICD-10-CM | POA: Diagnosis not present

## 2015-05-17 DIAGNOSIS — Z85828 Personal history of other malignant neoplasm of skin: Secondary | ICD-10-CM | POA: Diagnosis not present

## 2015-05-17 DIAGNOSIS — L219 Seborrheic dermatitis, unspecified: Secondary | ICD-10-CM | POA: Diagnosis not present

## 2015-05-17 DIAGNOSIS — L570 Actinic keratosis: Secondary | ICD-10-CM | POA: Diagnosis not present

## 2015-05-25 DIAGNOSIS — E1159 Type 2 diabetes mellitus with other circulatory complications: Secondary | ICD-10-CM | POA: Diagnosis not present

## 2015-05-25 DIAGNOSIS — I10 Essential (primary) hypertension: Secondary | ICD-10-CM | POA: Diagnosis not present

## 2015-05-25 DIAGNOSIS — E782 Mixed hyperlipidemia: Secondary | ICD-10-CM | POA: Diagnosis not present

## 2015-05-31 ENCOUNTER — Ambulatory Visit: Payer: Medicare Other | Admitting: Urology

## 2015-06-16 DIAGNOSIS — I10 Essential (primary) hypertension: Secondary | ICD-10-CM | POA: Diagnosis not present

## 2015-06-16 DIAGNOSIS — E1129 Type 2 diabetes mellitus with other diabetic kidney complication: Secondary | ICD-10-CM | POA: Diagnosis not present

## 2015-06-16 DIAGNOSIS — Z1389 Encounter for screening for other disorder: Secondary | ICD-10-CM | POA: Diagnosis not present

## 2015-06-16 DIAGNOSIS — Z0001 Encounter for general adult medical examination with abnormal findings: Secondary | ICD-10-CM | POA: Diagnosis not present

## 2015-06-16 DIAGNOSIS — E663 Overweight: Secondary | ICD-10-CM | POA: Diagnosis not present

## 2015-06-16 DIAGNOSIS — E119 Type 2 diabetes mellitus without complications: Secondary | ICD-10-CM | POA: Diagnosis not present

## 2015-06-16 DIAGNOSIS — Z6828 Body mass index (BMI) 28.0-28.9, adult: Secondary | ICD-10-CM | POA: Diagnosis not present

## 2015-06-21 DIAGNOSIS — E1142 Type 2 diabetes mellitus with diabetic polyneuropathy: Secondary | ICD-10-CM | POA: Diagnosis not present

## 2015-06-21 DIAGNOSIS — B351 Tinea unguium: Secondary | ICD-10-CM | POA: Diagnosis not present

## 2015-06-28 ENCOUNTER — Ambulatory Visit (INDEPENDENT_AMBULATORY_CARE_PROVIDER_SITE_OTHER): Payer: Medicare Other | Admitting: Urology

## 2015-06-28 DIAGNOSIS — N529 Male erectile dysfunction, unspecified: Secondary | ICD-10-CM

## 2015-07-22 ENCOUNTER — Ambulatory Visit (INDEPENDENT_AMBULATORY_CARE_PROVIDER_SITE_OTHER): Payer: Medicare Other | Admitting: Cardiovascular Disease

## 2015-07-22 ENCOUNTER — Encounter: Payer: Self-pay | Admitting: Cardiovascular Disease

## 2015-07-22 VITALS — BP 134/52 | HR 62 | Resp 16 | Ht 72.0 in | Wt 197.0 lb

## 2015-07-22 DIAGNOSIS — R0989 Other specified symptoms and signs involving the circulatory and respiratory systems: Secondary | ICD-10-CM

## 2015-07-22 DIAGNOSIS — I10 Essential (primary) hypertension: Secondary | ICD-10-CM | POA: Diagnosis not present

## 2015-07-22 DIAGNOSIS — I2510 Atherosclerotic heart disease of native coronary artery without angina pectoris: Secondary | ICD-10-CM

## 2015-07-22 NOTE — Patient Instructions (Signed)
Your physician has requested that you have a carotid duplex. This test is an ultrasound of the carotid arteries in your neck. It looks at blood flow through these arteries that supply the brain with blood. Allow one hour for this exam. There are no restrictions or special instructions.  Dr. Croitoru recommends that you schedule a follow-up appointment in: ONE YEAR    

## 2015-07-22 NOTE — Progress Notes (Signed)
Patient ID: Grant [name], male   DOB: 1934/05/17, 81 y.o.   MRN: [id number]       Cardiology Office Note   Date:  07/22/2015   ID:  Grant [name], DOB [birth year], MRN [id number]  PCP:  Rocky Morel, MD  Cardiologist:   Sanda Klein, MD   Chief Complaint  Patient presents with  . Annual Exam    Patient has ni complaints.      History of Present Illness: DEMARIOUS [name] is a 81 y.o. male who presents for  Follow-up for coronary artery disease and conduction system disease.   he has not had any symptoms of angina or dyspnea either at rest or with exertion. His activity level has been curtailed, at least in part due to the fact that his vision has deteriorated. He can no longer read. He can watch TV programs.  He denies syncope presyncope dizziness or lightheadedness. His echocardiogram continues to show trifascicular block ( PR 260 ms, right bundle branch block, left anterior fascicular block).  Mr. Ferrelli underwent four-vessel bypass surgery in 2012 (LIMA to LAD, SVG to acute marginal-RCA, sequential SVG to OM1 and OM 2) by Dr. Roxan Hockey. He has treated hyperlipidemia, hypertension, type 2 diabetes mellitus and history of gout. He has a chronic right bundle branch block and in 2015 also developed a left anterior fascicular block.  Past Medical History  Diagnosis Date  . Macular degeneration 09-09-13    bilateral"vision is poor"  . Diabetes mellitus   . Hypertension   . GERD (gastroesophageal reflux disease)   . Gout 09-09-13    last flare 8'14 with gallbladder attack at [facility]  . Arthritis   . Cancer     skin cancer of forehead  . Hyperlipidemia   . Erectile dysfunction   . RBBB   . CAD (coronary artery disease)   . Pancreatitis 10--8-14    8'14 -enzymes improved  . Psoriasis 09-09-13    elbows, knees    Past Surgical History  Procedure Laterality Date  . Coronary artery bypass graft  02-28-2011    LIMA to LAD,SVG to acute marginal branch of RCA &  sequential SVG to OM1 & OM2.  . Pilonidal cyst excision    . Colonoscopy  02/25/2012    Procedure: COLONOSCOPY;  Surgeon: Daneil Dolin, MD;  [facility];  Service: Endoscopy;  Laterality: N/A;  7:30 AM polyps removed.  . US echocardiography  11/09/2008    mildly impaired diastolic dysfunction - Grade I  . Nm myocar perf wall motion  02/15/2011    High Risk  . Cardiac catheterization  02/19/2011    3 vessel CAD  . Cholecystectomy N/A 10/27/2013    Procedure: LAPAROSCOPIC CHOLECYSTECTOMY WITH INTRAOPERATIVE CHOLANGIOGRAM WITH LIVER BIOPSY;  Surgeon: Pedro Earls, MD;  Location: [facility];  Service: General;  Laterality: N/A;  . Colonoscopy N/A 02/11/2015    Procedure: COLONOSCOPY;  Surgeon: Daneil Dolin, MD;  [facility];  Service: Endoscopy;  Laterality: N/A;  830      Current Outpatient Prescriptions  Medication Sig Dispense Refill  . allopurinol (ZYLOPRIM) 100 MG tablet Take 100 mg by mouth daily with breakfast.     . aspirin 325 MG tablet Take 325 mg by mouth daily.    Marland Kitchen atorvastatin (LIPITOR) 40 MG tablet Take 40 mg by mouth every evening.     . carvedilol (COREG) 12.5 MG tablet Take 12.5 mg by mouth 2 (two) times daily with a meal.    .  escitalopram (LEXAPRO) 10 MG tablet Take 10 mg by mouth daily.    . insulin glargine (LANTUS) 100 UNIT/ML injection Inject 15 Units into the skin at bedtime.     . metFORMIN (GLUCOPHAGE) 500 MG tablet Take 500 mg by mouth 2 (two) times daily.     . Misc Natural Products (TART CHERRY ADVANCED PO) Take 1,200 mg by mouth daily.    . Multiple Vitamins-Iron (MULTIVITAMINS WITH IRON) TABS Take 1 tablet by mouth daily.    . Multiple Vitamins-Minerals (PRESERVISION AREDS PO) Take 1 capsule by mouth 2 (two) times daily.     . naproxen sodium (ANAPROX) 220 MG tablet Take 220 mg by mouth daily as needed (headache).    . polyethylene glycol-electrolytes (TRILYTE) 420 G solution Take 4,000 mLs by mouth as directed. 4000 mL 0  . quinapril  (ACCUPRIL) 20 MG tablet Take 20 mg by mouth every morning.     . ranitidine (ZANTAC) 150 MG tablet Take 150 mg by mouth 2 (two) times daily.     No current facility-administered medications for this visit.    Allergies:   Amiodarone    Social History:  The patient  reports that he quit smoking about 32 years ago. His smoking use included Cigarettes. He has a 12.5 pack-year smoking history. He has never used smokeless tobacco. He reports that he drinks alcohol. He reports that he does not use illicit drugs.   Family History:  The patient's family history includes Cancer in his mother, sister, and sister; Heart failure in his brother and sister; Hypertension in his sister; Stroke in his father. There is no history of Anesthesia problems, Hypotension, Malignant hyperthermia, Pseudochol deficiency, or Colon cancer.    ROS:  Please see the history of present illness.    Otherwise, review of systems positive for none.   All other systems are reviewed and negative.    PHYSICAL EXAM: VS:  BP 134/52 mmHg  Pulse 62  Resp 16  Ht 6' (1.829 m)  Wt 197 lb (89.359 kg)  BMI 26.71 kg/m2 , BMI Body mass index is 26.71 kg/(m^2).  General: Alert, oriented x3, no distress Head: no evidence of trauma, PERRL, EOMI, no exophtalmos or lid lag, no myxedema, no xanthelasma; normal ears, nose and oropharynx Neck: normal jugular venous pulsations and no hepatojugular reflux; brisk carotid pulses without delay and bilateral (right> left) carotid bruits Chest: clear to auscultation, no signs of consolidation by percussion or palpation, normal fremitus, symmetrical and full respiratory excursions Cardiovascular: normal position and quality of the apical impulse, regular rhythm, normal first and widely split second heart sounds, 9-4/7 peaking systolic ejection aortic murmur, no rubs or gallops Abdomen: no tenderness or distention, no masses by palpation, no abnormal pulsatility or arterial bruits, normal bowel  sounds, no hepatosplenomegaly Extremities: no clubbing, cyanosis or edema; 2+ radial, ulnar and brachial pulses bilaterally; 2+ right femoral, posterior tibial and dorsalis pedis pulses; 2+ left femoral, posterior tibial and dorsalis pedis pulses; no subclavian or femoral bruits Neurological: grossly nonfocal Psych: euthymic mood, full affect   EKG:  EKG is ordered today. The ekg ordered today demonstrates  Sinus rhythm with first-degree AV block right bundle branch block left anterior fascicular block   Recent Labs: 01/31/2015: Hemoglobin 11.4*; Platelets 150    Lipid Panel No results found for: CHOL, TRIG, HDL, CHOLHDL, VLDL, LDLCALC, LDLDIRECT    Wt Readings from Last 3 Encounters:  07/22/15 197 lb (89.359 kg)  04/07/15 197 lb (89.359 kg)  02/11/15 193 lb (87.544  kg)      ASSESSMENT AND PLAN:  CAD (coronary artery disease) s/p CABG  Asymptomatic. Risk factor control is most important part of therapy at this point  Diabetes mellitus, type 2  Fair control  Hypertension  Mild, well controlled  Hyperlipidemia  On high dose statin. Target LDL less than 100, preferably less than 70. To get his labs from primary care provider Carotid bruit R>L Recheck carotid Dopplers Trifascicular block No symptoms of high-grade AV block. May need pacemaker in the future if he develops symptomatic bradycardia. Avoid increasing the dose of beta blocker or adding other negative chronotropic drug    Current medicines are reviewed at length with the patient today.  The patient does not have concerns regarding medicines.  The following changes have been made:  no change  Labs/ tests ordered today include:  Orders Placed This Encounter  Procedures  . EKG 12-Lead    Patient Instructions  Your physician has requested that you have a carotid duplex. This test is an ultrasound of the carotid arteries in your neck. It looks at blood flow through these arteries that supply the brain with  blood. Allow one hour for this exam. There are no restrictions or special instructions.  Dr. Sallyanne Kuster recommends that you schedule a follow-up appointment in: ONE YEAR       SignedSanda Klein, MD  07/22/2015 11:48 AM    Sanda Klein, MD, [facility] [phone] office [phone] pager

## 2015-07-25 ENCOUNTER — Encounter: Payer: Self-pay | Admitting: Cardiovascular Disease

## 2015-08-03 ENCOUNTER — Ambulatory Visit (HOSPITAL_COMMUNITY)
Admission: RE | Admit: 2015-08-03 | Discharge: 2015-08-03 | Disposition: A | Discharge status: Home / Self Care | Payer: Medicare Other | Source: Ambulatory Visit | Attending: Cardiovascular Disease | Admitting: Cardiovascular Disease

## 2015-08-03 DIAGNOSIS — I10 Essential (primary) hypertension: Secondary | ICD-10-CM | POA: Insufficient documentation

## 2015-08-03 DIAGNOSIS — I6523 Occlusion and stenosis of bilateral carotid arteries: Secondary | ICD-10-CM | POA: Diagnosis not present

## 2015-08-03 DIAGNOSIS — E119 Type 2 diabetes mellitus without complications: Secondary | ICD-10-CM | POA: Diagnosis not present

## 2015-08-03 DIAGNOSIS — R0989 Other specified symptoms and signs involving the circulatory and respiratory systems: Secondary | ICD-10-CM | POA: Diagnosis not present

## 2015-08-03 DIAGNOSIS — E785 Hyperlipidemia, unspecified: Secondary | ICD-10-CM | POA: Insufficient documentation

## 2015-08-03 DIAGNOSIS — F17200 Nicotine dependence, unspecified, uncomplicated: Secondary | ICD-10-CM | POA: Diagnosis not present

## 2015-08-04 ENCOUNTER — Telehealth: Payer: Self-pay | Admitting: Cardiovascular Disease

## 2015-08-04 NOTE — Telephone Encounter (Signed)
Returning your call from this morning. °

## 2015-08-04 NOTE — Telephone Encounter (Signed)
CAROTID DOPPLER RESULTS CALLED TO PATIENT.  VOICED UNDERSTANDING.  COPY SENT TO PCP.

## 2015-08-23 DIAGNOSIS — I10 Essential (primary) hypertension: Secondary | ICD-10-CM | POA: Diagnosis not present

## 2015-08-23 DIAGNOSIS — E1122 Type 2 diabetes mellitus with diabetic chronic kidney disease: Secondary | ICD-10-CM | POA: Diagnosis not present

## 2015-08-23 DIAGNOSIS — E785 Hyperlipidemia, unspecified: Secondary | ICD-10-CM | POA: Diagnosis not present

## 2015-08-30 DIAGNOSIS — B351 Tinea unguium: Secondary | ICD-10-CM | POA: Diagnosis not present

## 2015-08-30 DIAGNOSIS — E1142 Type 2 diabetes mellitus with diabetic polyneuropathy: Secondary | ICD-10-CM | POA: Diagnosis not present

## 2015-09-08 LAB — HEMOGLOBIN A1C: Hgb A1c MFr Bld: 6.9 % — AB (ref 4.0–6.0)

## 2015-09-15 DIAGNOSIS — Z1389 Encounter for screening for other disorder: Secondary | ICD-10-CM | POA: Diagnosis not present

## 2015-09-15 DIAGNOSIS — E663 Overweight: Secondary | ICD-10-CM | POA: Diagnosis not present

## 2015-09-15 DIAGNOSIS — Z6828 Body mass index (BMI) 28.0-28.9, adult: Secondary | ICD-10-CM | POA: Diagnosis not present

## 2015-09-15 DIAGNOSIS — Z23 Encounter for immunization: Secondary | ICD-10-CM | POA: Diagnosis not present

## 2015-09-15 DIAGNOSIS — R748 Abnormal levels of other serum enzymes: Secondary | ICD-10-CM | POA: Diagnosis not present

## 2015-09-19 ENCOUNTER — Encounter: Payer: Self-pay | Admitting: "Endocrinology

## 2015-09-19 ENCOUNTER — Ambulatory Visit (INDEPENDENT_AMBULATORY_CARE_PROVIDER_SITE_OTHER): Payer: Medicare Other | Admitting: "Endocrinology

## 2015-09-19 VITALS — BP 119/68 | HR 57 | Ht 70.0 in | Wt 196.0 lb

## 2015-09-19 DIAGNOSIS — I10 Essential (primary) hypertension: Secondary | ICD-10-CM

## 2015-09-19 DIAGNOSIS — E1159 Type 2 diabetes mellitus with other circulatory complications: Secondary | ICD-10-CM | POA: Diagnosis not present

## 2015-09-19 DIAGNOSIS — E785 Hyperlipidemia, unspecified: Secondary | ICD-10-CM

## 2015-09-19 DIAGNOSIS — I2510 Atherosclerotic heart disease of native coronary artery without angina pectoris: Secondary | ICD-10-CM

## 2015-09-19 NOTE — Progress Notes (Signed)
Subjective:    Patient ID: Grant [name], male    DOB: May 15, 1934,    Past Medical History  Diagnosis Date  . Macular degeneration 09-09-13    bilateral"vision is poor"  . Diabetes mellitus   . Hypertension   . GERD (gastroesophageal reflux disease)   . Gout 09-09-13    last flare 8'14 with gallbladder attack at [facility]  . Arthritis   . Cancer ([place])     skin cancer of forehead  . Hyperlipidemia   . Erectile dysfunction   . RBBB   . CAD (coronary artery disease)   . Pancreatitis 10--8-14    8'14 -enzymes improved  . Psoriasis 09-09-13    elbows, knees   Past Surgical History  Procedure Laterality Date  . Coronary artery bypass graft  02-28-2011    LIMA to LAD,SVG to acute marginal branch of RCA & sequential SVG to OM1 & OM2.  . Pilonidal cyst excision    . Colonoscopy  02/25/2012    Procedure: COLONOSCOPY;  Surgeon: Daneil Dolin, MD;  [facility];  Service: Endoscopy;  Laterality: N/A;  7:30 AM polyps removed.  . US echocardiography  11/09/2008    mildly impaired diastolic dysfunction - Grade I  . Nm myocar perf wall motion  02/15/2011    High Risk  . Cardiac catheterization  02/19/2011    3 vessel CAD  . Cholecystectomy N/A 10/27/2013    Procedure: LAPAROSCOPIC CHOLECYSTECTOMY WITH INTRAOPERATIVE CHOLANGIOGRAM WITH LIVER BIOPSY;  Surgeon: Pedro Earls, MD;  Location: [facility];  Service: General;  Laterality: N/A;  . Colonoscopy N/A 02/11/2015    Procedure: COLONOSCOPY;  Surgeon: Daneil Dolin, MD;  [facility];  Service: Endoscopy;  Laterality: N/A;  830    Social History   Social History  . Marital Status: Married    Spouse Name: N/A  . Number of Children: N/A  . Years of Education: N/A   Social History Main Topics  . Smoking status: Former Smoker -- 0.50 packs/day for 25 years    Types: Cigarettes    Quit date: 02/25/1983  . Smokeless tobacco: Never Used  . Alcohol Use: Yes     Comment: occasional  . Drug Use: No  . Sexual  Activity: No   Other Topics Concern  . None   Social History Narrative   Outpatient Encounter Prescriptions as of 09/19/2015  Medication Sig  . allopurinol (ZYLOPRIM) 100 MG tablet Take 100 mg by mouth daily with breakfast.   . aspirin 325 MG tablet Take 325 mg by mouth daily.  Marland Kitchen atorvastatin (LIPITOR) 40 MG tablet Take 40 mg by mouth every evening.   . carvedilol (COREG) 12.5 MG tablet Take 12.5 mg by mouth 2 (two) times daily with a meal.  . escitalopram (LEXAPRO) 10 MG tablet Take 10 mg by mouth daily.  . Insulin Glargine (LANTUS SOLOSTAR) 100 UNIT/ML Solostar Pen Inject 15 Units into the skin at bedtime.  . metFORMIN (GLUCOPHAGE) 500 MG tablet Take 500 mg by mouth 2 (two) times daily.   . Misc Natural Products (TART CHERRY ADVANCED PO) Take 1,200 mg by mouth daily.  . Multiple Vitamins-Iron (MULTIVITAMINS WITH IRON) TABS Take 1 tablet by mouth daily.  . Multiple Vitamins-Minerals (PRESERVISION AREDS PO) Take 1 capsule by mouth 2 (two) times daily.   . naproxen sodium (ANAPROX) 220 MG tablet Take 220 mg by mouth daily as needed (headache).  . polyethylene glycol-electrolytes (TRILYTE) 420 G solution Take 4,000 mLs by  mouth as directed.  . quinapril (ACCUPRIL) 20 MG tablet Take 20 mg by mouth every morning.   . ranitidine (ZANTAC) 150 MG tablet Take 150 mg by mouth 2 (two) times daily.  . insulin glargine (LANTUS) 100 UNIT/ML injection Inject 15 Units into the skin at bedtime.    No facility-administered encounter medications on file as of 09/19/2015.   ALLERGIES: Allergies  Allergen Reactions  . Amiodarone Nausea Only   VACCINATION STATUS: Immunization History  Administered Date(s) Administered  . Influenza-Unspecified 09/02/2013    Diabetes He presents for his follow-up diabetic visit. He has type 2 diabetes mellitus. Onset time: He was diagnosed at approximate age of 53 years. His disease course has been improving. There are no hypoglycemic associated symptoms. Pertinent  negatives for hypoglycemia include no confusion, headaches, pallor or seizures. There are no diabetic associated symptoms. Pertinent negatives for diabetes include no chest pain, no fatigue, no polydipsia, no polyphagia, no polyuria and no weakness. There are no hypoglycemic complications. Symptoms are improving. Diabetic complications include heart disease. Risk factors for coronary artery disease include dyslipidemia, diabetes mellitus, hypertension and tobacco exposure. He is compliant with treatment most of the time. His weight is stable. He is following a generally healthy diet. He participates in exercise intermittently. His home blood glucose trend is decreasing steadily. His overall blood glucose range is 140-180 mg/dl. Eye exam is current.  Hypertension This is a chronic problem. The current episode started more than 1 year ago. Pertinent negatives include no chest pain, headaches, neck pain, palpitations or shortness of breath. Hypertensive end-organ damage includes CAD/MI.  Hyperlipidemia This is a chronic problem. The current episode started more than 1 year ago. The problem is controlled. Pertinent negatives include no chest pain, myalgias or shortness of breath.     Review of Systems  Constitutional: Negative for fatigue and unexpected weight change.  HENT: Negative for dental problem, mouth sores and trouble swallowing.   Eyes: Negative for visual disturbance.  Respiratory: Negative for cough, choking, chest tightness, shortness of breath and wheezing.   Cardiovascular: Negative for chest pain, palpitations and leg swelling.  Gastrointestinal: Negative for nausea, vomiting, abdominal pain, diarrhea, constipation and abdominal distention.  Endocrine: Negative for polydipsia, polyphagia and polyuria.  Genitourinary: Negative for dysuria, urgency, hematuria and flank pain.  Musculoskeletal: Negative for myalgias, back pain, gait problem and neck pain.  Skin: Negative for pallor, rash  and wound.  Neurological: Negative for seizures, syncope, weakness, numbness and headaches.  Psychiatric/Behavioral: Negative.  Negative for confusion and dysphoric mood.    Objective:    BP 119/68 mmHg  Pulse 57  Ht 5\' 10"  (1.778 m)  Wt 196 lb (88.905 kg)  BMI 28.12 kg/m2  SpO2 98%  Wt Readings from Last 3 Encounters:  09/19/15 196 lb (88.905 kg)  07/22/15 197 lb (89.359 kg)  04/07/15 197 lb (89.359 kg)    Physical Exam  Constitutional: He is oriented to person, place, and time. He appears well-developed and well-nourished. He is cooperative. No distress.  HENT:  Head: Normocephalic and atraumatic.  Eyes: EOM are normal.  Neck: Normal range of motion. Neck supple. No tracheal deviation present. No thyromegaly present.  Cardiovascular: Normal rate, S1 normal, S2 normal and normal heart sounds.  Exam reveals no gallop.   No murmur heard. Pulses:      Dorsalis pedis pulses are 1+ on the right side, and 1+ on the left side.       Posterior tibial pulses are 1+ on the right side,  and 1+ on the left side.  Pulmonary/Chest: Breath sounds normal. No respiratory distress. He has no wheezes.  Abdominal: Soft. Bowel sounds are normal. He exhibits no distension. There is no tenderness. There is no guarding and no CVA tenderness.  Musculoskeletal: He exhibits no edema.       Right shoulder: He exhibits no swelling and no deformity.  Neurological: He is alert and oriented to person, place, and time. He has normal strength and normal reflexes. No cranial nerve deficit or sensory deficit. Gait normal.  Skin: Skin is warm and dry. No rash noted. No cyanosis. Nails show no clubbing.  Psychiatric: He has a normal mood and affect. His speech is normal and behavior is normal. Judgment and thought content normal. Cognition and memory are normal.    Results for orders placed or performed in visit on 09/19/15  Hemoglobin A1c  Result Value Ref Range   Hgb A1c MFr Bld 6.9 (A) 4.0 - 6.0 %   Complete  Blood Count (Most recent): Lab Results  Component Value Date   WBC 5.5 01/31/2015   HGB 11.4* 01/31/2015   HCT 35.0* 01/31/2015   MCV 92.8 01/31/2015   PLT 150 01/31/2015   Chemistry (most recent): Lab Results  Component Value Date   NA 138 10/20/2013   K 5.0 10/20/2013   CL 103 10/20/2013   CO2 28 10/20/2013   BUN 22 10/20/2013   CREATININE 1.25 10/27/2013   Diabetic Labs (most recent): Lab Results  Component Value Date   HGBA1C 6.9* 09/08/2015   HGBA1C 8.8* 10/27/2013   HGBA1C * 02/26/2011    6.8 (NOTE)                                                                       According to the ADA Clinical Practice Recommendations for 2011, when HbA1c is used as a screening test:   >=6.5%   Diagnostic of Diabetes Mellitus           (if abnormal result  is confirmed)  5.7-6.4%   Increased risk of developing Diabetes Mellitus  References:Diagnosis and Classification of Diabetes Mellitus,Diabetes JOAC,1660,63(KZSWF 1):S62-S69 and Standards of Medical Care in         Diabetes - 2011,Diabetes Care,2011,34  (Suppl 1):S11-S61.   Lipid profile (most recent): No results found for: TRIG, CHOL       Assessment & Plan:   1. Type 2 diabetes mellitus with vascular disease ([place])  his diabetes is  complicated by coronary artery disease. Patient came with stable glucose profile, and  recent A1c of 6.9 %.  Glucose logs and insulin administration records pertaining to this visit,  to be scanned into patient's records.  Recent labs reviewed. - Patient remains at a high risk for more acute and chronic complications of diabetes which include CAD, CVA, CKD, retinopathy, and neuropathy. These are all discussed in detail with the patient.  - I have re-counseled the patient on diet management and   by adopting a carbohydrate restricted / protein rich  Diet. - Patient is advised to stick to a routine mealtimes to eat 3 meals  a day and avoid unnecessary snacks ( to snack only to correct  hypoglycemia).  - Suggestion is made for patient to avoid  simple carbohydrates   from their diet including Cakes , Desserts, Ice Cream,  Soda (  diet and regular) , Sweet Tea , Candies,  Chips, Cookies, Artificial Sweeteners,   and "Sugar-free" Products .  This will help patient to have stable blood glucose profile and potentially avoid unintended  Weight gain.  - The patient  has been  scheduled with Jearld Fenton, RDN, CDE for individualized DM education. - I have approached patient with the following individualized plan to manage diabetes and patient agrees.  -I will continue Metformin to 500mg  po BID. I will continue Lantus 15 units qhs, monitor before breakfast everyday.   - Patient specific target  for A1c; LDL, HDL, Triglycerides, and  Waist Circumference were discussed in detail.  2) BP/HTN: Controlled. Continue current medications including ACEI. 3) Lipids/HPL: continue statins. 4)  Weight/Diet: CDE consult in progress, exercise, and carbohydrates information provided.  5) Chronic Care/Health Maintenance:  -Patient  on ACEI and Statin medications and encouraged to continue to follow up with Ophthalmology, Podiatrist at least yearly or according to recommendations, and advised to  stay away from smoking. I have recommended yearly flu vaccine and pneumonia vaccination at least every 5 years; moderate intensity exercise for up to 150 minutes weekly; and  sleep for at least 7 hours a day.  I advised patient to maintain close follow up with their PCP for primary care needs.  Patient is asked to bring meter and  blood glucose logs during their next visit.   Follow up plan: Return in about 3 months (around 12/20/2015) for diabetes, high blood pressure, high cholesterol, follow up with pre-visit labs, meter, and logs.  Glade Lloyd, MD Phone: [phone]  Fax: [phone]   09/19/2015, 9:35 AM

## 2015-09-19 NOTE — Patient Instructions (Signed)

## 2015-09-26 ENCOUNTER — Telehealth: Payer: Self-pay | Admitting: Cardiovascular Disease

## 2015-09-26 NOTE — Telephone Encounter (Signed)
Jackelyn Poling is calling because Grant [name] is at the office to get his teeth clean and they need to know if he needs any pre-meds . Please call

## 2015-09-26 NOTE — Telephone Encounter (Signed)
Spoke with Jackelyn Poling at dentist office. Patient is there now and marked on his health history form that he has "artificial heart valves". Informed Debbie that patient has had bypass but no valve replacement/repair - no pre-meds needed

## 2015-10-05 ENCOUNTER — Encounter: Payer: Medicare Other | Admitting: Nutrition

## 2015-10-10 ENCOUNTER — Encounter: Payer: Medicare Other | Attending: "Endocrinology | Admitting: Nutrition

## 2015-10-10 ENCOUNTER — Encounter: Payer: Self-pay | Admitting: Nutrition

## 2015-10-10 VITALS — Ht 70.0 in | Wt 193.0 lb

## 2015-10-10 DIAGNOSIS — Z794 Long term (current) use of insulin: Secondary | ICD-10-CM | POA: Diagnosis not present

## 2015-10-10 DIAGNOSIS — Z713 Dietary counseling and surveillance: Secondary | ICD-10-CM | POA: Insufficient documentation

## 2015-10-10 DIAGNOSIS — E119 Type 2 diabetes mellitus without complications: Secondary | ICD-10-CM | POA: Insufficient documentation

## 2015-10-10 NOTE — Progress Notes (Signed)
  Medical Nutrition Therapy:  Appt start time: 1630  end time: 1645 MAssessment:  Primary concerns today: Diabetes. Follow up DM. A1C A1c 6.7%. Doing very well.  Got more serious with his diet. 15 units of Lantus. 500 MG of Metformin BID Walking some now. Eating more regular balanced meals. Taking insulin on schedule. No low blood sugars. BS log reveals FBS 109-130 mg/dl. Visually impaired. Can use the insulin pen without any problems. He give his insulin in his stomach area and rotates the site. Feels good.Walking daily. Eating meals on schedule. Diet is very well balanced.  Preferred Learning Style:   Auditory  Hands on  Learning Readiness:   Ready  Change in progress  MEDICATIONS: see list   DIETARY INTAKE:  24-hr recall:  B) Eggs, special k,  With milk, amd fruit. L)  Pinto beans, collard greens, beets,  Cornbread, water D Scrambled eggs, 1 slice bacon, potatoes and toast, water.  Usual physical activity: walking for about 1 mile daily.  Estimated energy needs: 1800 calories 200 g carbohydrates 135 g protein 50 g fat  Progress Towards Goal(s):  In progress.   Nutritional Diagnosis:  Nutrition and diabetes knowledge deficit As related to diabetes As evidenced by A1C of 8%.    Intervention:  Nutrition counseling and diabetes education on CHO counting, meal planning and the need for increased physical activity.  Goal 1.  Eat three meals per day with 45-60 g carbs per meal.. 2. Prevent low blood sugars. 1. Follow My Palte 2. Keep walking daily. 3. Continue fresh fruits and vegetables. 4.  Keep A1C 6.5%. Keep up the [place]!!!  Teaching Method Utilized:  Visual Auditory Hands on  Handouts given during visit include: My Plate  Barriers to learning/adherence to lifestyle change:vision  Demonstrated degree of understanding via:  Teach Back   Monitoring/Evaluation:  Dietary intake, exercise, meal planning, SBG , and body weight in 6 month(s).

## 2015-10-10 NOTE — Patient Instructions (Addendum)
  Goal 1.  Eat three meals per day with 45-60 g carbs per meal.. 2. Prevent low blood sugars. 1. Follow My Palte 2. Keep walking daily. 3. COntinue fresh fruits and vegetables. 4.  Keep A1C 6.5%. Keep up the [place]!!!

## 2015-10-25 DIAGNOSIS — H353133 Nonexudative age-related macular degeneration, bilateral, advanced atrophic without subfoveal involvement: Secondary | ICD-10-CM | POA: Diagnosis not present

## 2015-10-25 DIAGNOSIS — E119 Type 2 diabetes mellitus without complications: Secondary | ICD-10-CM | POA: Diagnosis not present

## 2015-10-25 DIAGNOSIS — H2513 Age-related nuclear cataract, bilateral: Secondary | ICD-10-CM | POA: Diagnosis not present

## 2015-10-25 LAB — HM DIABETES EYE EXAM

## 2015-11-08 DIAGNOSIS — E1142 Type 2 diabetes mellitus with diabetic polyneuropathy: Secondary | ICD-10-CM | POA: Diagnosis not present

## 2015-11-08 DIAGNOSIS — B351 Tinea unguium: Secondary | ICD-10-CM | POA: Diagnosis not present

## 2015-11-21 ENCOUNTER — Other Ambulatory Visit: Payer: Self-pay | Admitting: "Endocrinology

## 2015-12-08 DIAGNOSIS — E119 Type 2 diabetes mellitus without complications: Secondary | ICD-10-CM | POA: Diagnosis not present

## 2015-12-08 DIAGNOSIS — H43813 Vitreous degeneration, bilateral: Secondary | ICD-10-CM | POA: Diagnosis not present

## 2015-12-08 DIAGNOSIS — H2513 Age-related nuclear cataract, bilateral: Secondary | ICD-10-CM | POA: Diagnosis not present

## 2015-12-08 DIAGNOSIS — Z794 Long term (current) use of insulin: Secondary | ICD-10-CM | POA: Diagnosis not present

## 2015-12-08 DIAGNOSIS — H353134 Nonexudative age-related macular degeneration, bilateral, advanced atrophic with subfoveal involvement: Secondary | ICD-10-CM | POA: Diagnosis not present

## 2015-12-12 ENCOUNTER — Encounter: Payer: Self-pay | Admitting: "Endocrinology

## 2015-12-16 ENCOUNTER — Other Ambulatory Visit: Payer: Self-pay | Admitting: "Endocrinology

## 2015-12-16 DIAGNOSIS — E1159 Type 2 diabetes mellitus with other circulatory complications: Secondary | ICD-10-CM | POA: Diagnosis not present

## 2015-12-16 LAB — BASIC METABOLIC PANEL
BUN: 23 mg/dL (ref 7–25)
CO2: 30 mmol/L (ref 20–31)
Calcium: 9.2 mg/dL (ref 8.6–10.3)
Chloride: 103 mmol/L (ref 98–110)
Creat: 1.12 mg/dL (ref 0.70–1.18)
Glucose, Bld: 90 mg/dL (ref 65–99)
POTASSIUM: 4.8 mmol/L (ref 3.5–5.3)
Sodium: 141 mmol/L (ref 135–146)

## 2015-12-23 ENCOUNTER — Encounter: Payer: Self-pay | Admitting: "Endocrinology

## 2015-12-23 ENCOUNTER — Ambulatory Visit (INDEPENDENT_AMBULATORY_CARE_PROVIDER_SITE_OTHER): Payer: Medicare Other | Admitting: "Endocrinology

## 2015-12-23 VITALS — BP 135/78 | HR 60 | Ht 70.0 in | Wt 196.0 lb

## 2015-12-23 DIAGNOSIS — E785 Hyperlipidemia, unspecified: Secondary | ICD-10-CM

## 2015-12-23 DIAGNOSIS — E1159 Type 2 diabetes mellitus with other circulatory complications: Secondary | ICD-10-CM | POA: Diagnosis not present

## 2015-12-23 DIAGNOSIS — I10 Essential (primary) hypertension: Secondary | ICD-10-CM

## 2015-12-23 NOTE — Progress Notes (Signed)
Subjective:    Patient ID: Grant [name], male    DOB: [birth year],    Past Medical History  Diagnosis Date  . Macular degeneration 09-09-13    bilateral"vision is poor"  . Diabetes mellitus   . Hypertension   . GERD (gastroesophageal reflux disease)   . Gout 09-09-13    last flare 8'14 with gallbladder attack at [facility]  . Arthritis   . Cancer ([place])     skin cancer of forehead  . Hyperlipidemia   . Erectile dysfunction   . RBBB   . CAD (coronary artery disease)   . Pancreatitis 10--8-14    8'14 -enzymes improved  . Psoriasis 09-09-13    elbows, knees   Past Surgical History  Procedure Laterality Date  . Coronary artery bypass graft  02-28-2011    LIMA to LAD,SVG to acute marginal branch of RCA & sequential SVG to OM1 & OM2.  . Pilonidal cyst excision    . Colonoscopy  02/25/2012    Procedure: COLONOSCOPY;  Surgeon: Daneil Dolin, MD;  Location: [facility];  Service: Endoscopy;  Laterality: N/A;  7:30 AM polyps removed.  . US echocardiography  11/09/2008    mildly impaired diastolic dysfunction - Grade I  . Nm myocar perf wall motion  02/15/2011    High Risk  . Cardiac catheterization  02/19/2011    3 vessel CAD  . Cholecystectomy N/A 10/27/2013    Procedure: LAPAROSCOPIC CHOLECYSTECTOMY WITH INTRAOPERATIVE CHOLANGIOGRAM WITH LIVER BIOPSY;  Surgeon: Pedro Earls, MD;  Location: [facility];  Service: General;  Laterality: N/A;  . Colonoscopy N/A 02/11/2015    Procedure: COLONOSCOPY;  Surgeon: Daneil Dolin, MD;  Location: [facility];  Service: Endoscopy;  Laterality: N/A;  830    Social History   Social History  . Marital Status: Married    Spouse Name: N/A  . Number of Children: N/A  . Years of Education: N/A   Social History Main Topics  . Smoking status: Former Smoker -- 0.50 packs/day for 25 years    Types: Cigarettes    Quit date: 02/25/1983  . Smokeless tobacco: Never Used  . Alcohol Use: Yes     Comment: occasional  . Drug Use: No  . Sexual  Activity: No   Other Topics Concern  . None   Social History Narrative   Outpatient Encounter Prescriptions as of 12/23/2015  Medication Sig  . Insulin Glargine (LANTUS SOLOSTAR) 100 UNIT/ML Solostar Pen Inject 15 Units into the skin at bedtime.  . metFORMIN (GLUCOPHAGE) 500 MG tablet Take 500 mg by mouth 2 (two) times daily.   Marland Kitchen allopurinol (ZYLOPRIM) 100 MG tablet Take 100 mg by mouth daily with breakfast.   . aspirin 325 MG tablet Take 325 mg by mouth daily.  Marland Kitchen atorvastatin (LIPITOR) 40 MG tablet Take 40 mg by mouth every evening.   . B-D ULTRAFINE III SHORT PEN 31G X 8 MM MISC USE 1 DAILY AS DIRECTED  . carvedilol (COREG) 12.5 MG tablet Take 12.5 mg by mouth 2 (two) times daily with a meal.  . escitalopram (LEXAPRO) 10 MG tablet Take 10 mg by mouth daily.  . Misc Natural Products (TART CHERRY ADVANCED PO) Take 1,200 mg by mouth daily.  . Multiple Vitamins-Iron (MULTIVITAMINS WITH IRON) TABS Take 1 tablet by mouth daily.  . Multiple Vitamins-Minerals (PRESERVISION AREDS PO) Take 1 capsule by mouth 2 (two) times daily.   . naproxen sodium (ANAPROX) 220 MG tablet Take 220 mg by  mouth daily as needed (headache).  . polyethylene glycol-electrolytes (TRILYTE) 420 G solution Take 4,000 mLs by mouth as directed.  . quinapril (ACCUPRIL) 20 MG tablet Take 20 mg by mouth every morning.   . ranitidine (ZANTAC) 150 MG tablet Take 150 mg by mouth 2 (two) times daily.  . [DISCONTINUED] insulin glargine (LANTUS) 100 UNIT/ML injection Inject 15 Units into the skin at bedtime.    No facility-administered encounter medications on file as of 12/23/2015.   ALLERGIES: Allergies  Allergen Reactions  . Amiodarone Nausea Only   VACCINATION STATUS: Immunization History  Administered Date(s) Administered  . Influenza-Unspecified 09/02/2013    Diabetes He presents for his follow-up diabetic visit. He has type 2 diabetes mellitus. Onset time: He was diagnosed at approximate age of 68 years. His disease  course has been improving. There are no hypoglycemic associated symptoms. Pertinent negatives for hypoglycemia include no confusion, headaches, pallor or seizures. There are no diabetic associated symptoms. Pertinent negatives for diabetes include no chest pain, no fatigue, no polydipsia, no polyphagia, no polyuria and no weakness. There are no hypoglycemic complications. Symptoms are improving. Diabetic complications include heart disease. Risk factors for coronary artery disease include dyslipidemia, diabetes mellitus, hypertension and tobacco exposure. He is compliant with treatment most of the time. His weight is stable. He is following a generally healthy diet. He participates in exercise intermittently. His home blood glucose trend is decreasing steadily. His breakfast blood glucose range is generally 130-140 mg/dl. His overall blood glucose range is 130-140 mg/dl. Eye exam is current.  Hypertension This is a chronic problem. The current episode started more than 1 year ago. Pertinent negatives include no chest pain, headaches, neck pain, palpitations or shortness of breath. Hypertensive end-organ damage includes CAD/MI.  Hyperlipidemia This is a chronic problem. The current episode started more than 1 year ago. The problem is controlled. Pertinent negatives include no chest pain, myalgias or shortness of breath.     Review of Systems  Constitutional: Negative for fatigue and unexpected weight change.  HENT: Negative for dental problem, mouth sores and trouble swallowing.   Eyes: Negative for visual disturbance.  Respiratory: Negative for cough, choking, chest tightness, shortness of breath and wheezing.   Cardiovascular: Negative for chest pain, palpitations and leg swelling.  Gastrointestinal: Negative for nausea, vomiting, abdominal pain, diarrhea, constipation and abdominal distention.  Endocrine: Negative for polydipsia, polyphagia and polyuria.  Genitourinary: Negative for dysuria,  urgency, hematuria and flank pain.  Musculoskeletal: Negative for myalgias, back pain, gait problem and neck pain.  Skin: Negative for pallor, rash and wound.  Neurological: Negative for seizures, syncope, weakness, numbness and headaches.  Psychiatric/Behavioral: Negative.  Negative for confusion and dysphoric mood.    Objective:    BP 135/78 mmHg  Pulse 60  Ht 5\' 10"  (1.778 m)  Wt 196 lb (88.905 kg)  BMI 28.12 kg/m2  SpO2 98%  Wt Readings from Last 3 Encounters:  12/23/15 196 lb (88.905 kg)  10/10/15 193 lb (87.544 kg)  09/19/15 196 lb (88.905 kg)    Physical Exam  Constitutional: He is oriented to person, place, and time. He appears well-developed and well-nourished. He is cooperative. No distress.  HENT:  Head: Normocephalic and atraumatic.  Eyes: EOM are normal.  Neck: Normal range of motion. Neck supple. No tracheal deviation present. No thyromegaly present.  Cardiovascular: Normal rate, S1 normal, S2 normal and normal heart sounds.  Exam reveals no gallop.   No murmur heard. Pulses:      Dorsalis pedis pulses  are 1+ on the right side, and 1+ on the left side.       Posterior tibial pulses are 1+ on the right side, and 1+ on the left side.  Pulmonary/Chest: Breath sounds normal. No respiratory distress. He has no wheezes.  Abdominal: Soft. Bowel sounds are normal. He exhibits no distension. There is no tenderness. There is no guarding and no CVA tenderness.  Musculoskeletal: He exhibits no edema.       Right shoulder: He exhibits no swelling and no deformity.  Neurological: He is alert and oriented to person, place, and time. He has normal strength and normal reflexes. No cranial nerve deficit or sensory deficit. Gait normal.  Skin: Skin is warm and dry. No rash noted. No cyanosis. Nails show no clubbing.  Psychiatric: He has a normal mood and affect. His speech is normal and behavior is normal. Judgment and thought content normal. Cognition and memory are normal.     Results for orders placed or performed in visit on 0000000  Basic metabolic panel  Result Value Ref Range   Sodium 141 135 - 146 mmol/L   Potassium 4.8 3.5 - 5.3 mmol/L   Chloride 103 98 - 110 mmol/L   CO2 30 20 - 31 mmol/L   Glucose, Bld 90 65 - 99 mg/dL   BUN 23 7 - 25 mg/dL   Creat 1.12 0.70 - 1.18 mg/dL   Calcium 9.2 8.6 - 10.3 mg/dL   Complete Blood Count (Most recent): Lab Results  Component Value Date   WBC 5.5 01/31/2015   HGB 11.4* 01/31/2015   HCT 35.0* 01/31/2015   MCV 92.8 01/31/2015   PLT 150 01/31/2015   Chemistry (most recent): Lab Results  Component Value Date   NA 141 12/16/2015   K 4.8 12/16/2015   CL 103 12/16/2015   CO2 30 12/16/2015   BUN 23 12/16/2015   CREATININE 1.12 12/16/2015   Diabetic Labs (most recent): Lab Results  Component Value Date   HGBA1C 6.9* 09/08/2015   HGBA1C 8.8* 10/27/2013   HGBA1C * 02/26/2011    6.8 (NOTE)                                                                       According to the ADA Clinical Practice Recommendations for 2011, when HbA1c is used as a screening test:   >=6.5%   Diagnostic of Diabetes Mellitus           (if abnormal result  is confirmed)  5.7-6.4%   Increased risk of developing Diabetes Mellitus  References:Diagnosis and Classification of Diabetes Mellitus,Diabetes [id number] 1):S62-S69 and Standards of Medical Care in         Diabetes - 2011,Diabetes Care,2011,34  (Suppl 1):S11-S61.   Lipid profile (most recent): No results found for: TRIG, CHOL       Assessment & Plan:   1. Type 2 diabetes mellitus with vascular disease ([place])  his diabetes is  complicated by coronary artery disease. Patient came with stable glucose profile, and  recent A1c of 6.9 %.  Glucose logs and insulin administration records pertaining to this visit,  to be scanned into patient's records.  Recent labs reviewed. - Patient remains at a high risk for more acute and chronic  complications of diabetes which  include CAD, CVA, CKD, retinopathy, and neuropathy. These are all discussed in detail with the patient.  - I have re-counseled the patient on diet management and   by adopting a carbohydrate restricted / protein rich  Diet. - Patient is advised to stick to a routine mealtimes to eat 3 meals  a day and avoid unnecessary snacks ( to snack only to correct hypoglycemia).  - Suggestion is made for patient to avoid simple carbohydrates   from their diet including Cakes , Desserts, Ice Cream,  Soda (  diet and regular) , Sweet Tea , Candies,  Chips, Cookies, Artificial Sweeteners,   and "Sugar-free" Products .  This will help patient to have stable blood glucose profile and potentially avoid unintended  Weight gain.  - The patient  has been  scheduled with Jearld Fenton, RDN, CDE for individualized DM education. - I have approached patient with the following individualized plan to manage diabetes and patient agrees.  -I will continue Metformin to 500mg  po BID. I will continue Lantus 15 units qhs, monitor before breakfast everyday.   - Patient specific target  for A1c; LDL, HDL, Triglycerides, and  Waist Circumference were discussed in detail.  2) BP/HTN: Controlled. Continue current medications including ACEI. 3) Lipids/HPL: continue statins. 4)  Weight/Diet: CDE consult in progress, exercise, and carbohydrates information provided.  5) Chronic Care/Health Maintenance:  -Patient  on ACEI and Statin medications and encouraged to continue to follow up with Ophthalmology, Podiatrist at least yearly or according to recommendations, and advised to  stay away from smoking. I have recommended yearly flu vaccine and pneumonia vaccination at least every 5 years; moderate intensity exercise for up to 150 minutes weekly; and  sleep for at least 7 hours a day.  I advised patient to maintain close follow up with their PCP for primary care needs.  Patient is asked to bring meter and  blood glucose logs during  their next visit.   Follow up plan: Return in about 3 months (around 03/22/2016) for diabetes, high blood pressure, high cholesterol, follow up with pre-visit labs, meter, and logs.  Glade Lloyd, MD Phone: [phone]  Fax: [phone]   12/23/2015, 2:08 PM

## 2016-01-31 DIAGNOSIS — H2512 Age-related nuclear cataract, left eye: Secondary | ICD-10-CM | POA: Diagnosis not present

## 2016-01-31 DIAGNOSIS — H2513 Age-related nuclear cataract, bilateral: Secondary | ICD-10-CM | POA: Diagnosis not present

## 2016-01-31 DIAGNOSIS — H353133 Nonexudative age-related macular degeneration, bilateral, advanced atrophic without subfoveal involvement: Secondary | ICD-10-CM | POA: Diagnosis not present

## 2016-02-06 NOTE — Patient Instructions (Signed)
Grant [name]  02/06/2016     @PREFPERIOPPHARMACY @   Your procedure is scheduled on 02/14/16.  Report to [facility] at 7:30 A.M.  Call this number if you have problems the morning of surgery:  [phone]   Remember:  Do not eat food or drink liquids after midnight.  Take these medicines the morning of surgery with A SIP OF WATER Allopurinol, Coreg, Lexapro, Accupril, Zantac   Do not wear jewelry, make-up or nail polish.  Do not wear lotions, powders, or perfumes.  You may wear deodorant.  Do not shave 48 hours prior to surgery.  Men may shave face and neck.  Do not bring valuables to the hospital.  [facility] is not responsible for any belongings or valuables.  Contacts, dentures or bridgework may not be worn into surgery.  Leave your suitcase in the car.  After surgery it may be brought to your room.  For patients admitted to the hospital, discharge time will be determined by your treatment team.  Patients discharged the day of surgery will not be allowed to drive home.    Please read over the following fact sheets that you were given. Anesthesia Post-op Instructions     PATIENT INSTRUCTIONS POST-ANESTHESIA  IMMEDIATELY FOLLOWING SURGERY:  Do not drive or operate machinery for the first twenty four hours after surgery.  Do not make any important decisions for twenty four hours after surgery or while taking narcotic pain medications or sedatives.  If you develop intractable nausea and vomiting or a severe headache please notify your doctor immediately.  FOLLOW-UP:  Please make an appointment with your surgeon as instructed. You do not need to follow up with anesthesia unless specifically instructed to do so.  WOUND CARE INSTRUCTIONS (if applicable):  Keep a dry clean dressing on the anesthesia/puncture wound site if there is drainage.  Once the wound has quit draining you may leave it open to air.  Generally you should leave the bandage intact for twenty four hours  unless there is drainage.  If the epidural site drains for more than 36-48 hours please call the anesthesia department.  QUESTIONS?:  Please feel free to call your physician or the hospital operator if you have any questions, and they will be happy to assist you.       A cataract is a clouding of the lens of the eye. When a lens becomes cloudy, vision is reduced based on the degree and nature of the clouding. Surgery may be needed to improve vision. Surgery removes the cloudy lens and usually replaces it with a substitute lens (intraocular lens, IOL). LET YOUR EYE DOCTOR KNOW ABOUT:  Allergies to food or medicine.  Medicines taken including herbs, eye drops, over-the-counter medicines, and creams.  Use of steroids (by mouth or creams).  Previous problems with anesthetics or numbing medicine.  History of bleeding problems or blood clots.  Previous surgery.  Other health problems, including diabetes and kidney problems.  Possibility of pregnancy, if this applies. RISKS AND COMPLICATIONS  Infection.  Inflammation of the eyeball (endophthalmitis) that can spread to both eyes (sympathetic ophthalmia).  Poor wound healing.  If an IOL is inserted, it can later fall out of proper position. This is very uncommon.  Clouding of the part of your eye that holds an IOL in place. This is called an "after-cataract." These are uncommon but easily treated. BEFORE THE PROCEDURE  Do not eat or drink anything except small amounts of water for 8 to 12  before your surgery, or as directed by your caregiver.  Unless you are told otherwise, continue any eye drops you have been prescribed.  Talk to your primary caregiver about all other medicines that you take (both prescription and nonprescription). In some cases, you may need to stop or change medicines near the time of your surgery. This is most important if you are taking blood-thinning medicine.Do not stop medicines unless you are told to do  so.  Arrange for someone to drive you to and from the procedure.  Do not put contact lenses in either eye on the day of your surgery. PROCEDURE There is more than one method for safely removing a cataract. Your doctor can explain the differences and help determine which is best for you. Phacoemulsification surgery is the most common form of cataract surgery.  An injection is given behind the eye or eye drops are given to make this a painless procedure.  A small cut (incision) is made on the edge of the clear, dome-shaped surface that covers the front of the eye (cornea).  A tiny probe is painlessly inserted into the eye. This device gives off ultrasound waves that soften and break up the cloudy center of the lens. This makes it easier for the cloudy lens to be removed by suction.  An IOL may be implanted.  The normal lens of the eye is covered by a clear capsule. Part of that capsule is intentionally left in the eye to support the IOL.  Your surgeon may or may not use stitches to close the incision. There are other forms of cataract surgery that require a larger incision and stitches to close the eye. This approach is taken in cases where the doctor feels that the cataract cannot be easily removed using phacoemulsification. AFTER THE PROCEDURE  When an IOL is implanted, it does not need care. It becomes a permanent part of your eye and cannot be seen or felt.  Your doctor will schedule follow-up exams to check on your progress.  Review your other medicines with your doctor to see which can be resumed after surgery.  Use eye drops or take medicine as prescribed by your doctor.   This information is not intended to replace advice given to you by your health care provider. Make sure you discuss any questions you have with your health care provider.   Document Released: 11/08/2011 Document Revised: 12/10/2014 Document Reviewed: 11/08/2011 Elsevier Interactive Patient Education [employer]

## 2016-02-07 ENCOUNTER — Encounter (HOSPITAL_COMMUNITY): Payer: Self-pay

## 2016-02-07 ENCOUNTER — Encounter (HOSPITAL_COMMUNITY)
Admission: RE | Admit: 2016-02-07 | Discharge: 2016-02-07 | Disposition: A | Discharge status: Home / Self Care | Payer: Medicare Other | Source: Ambulatory Visit | Attending: Ophthalmology | Admitting: Ophthalmology

## 2016-02-07 DIAGNOSIS — Z01812 Encounter for preprocedural laboratory examination: Secondary | ICD-10-CM | POA: Diagnosis not present

## 2016-02-07 LAB — CBC
HCT: 31.5 % — ABNORMAL LOW (ref 39.0–52.0)
HEMOGLOBIN: 10.5 g/dL — AB (ref 13.0–17.0)
MCH: 31.3 pg (ref 26.0–34.0)
MCHC: 33.3 g/dL (ref 30.0–36.0)
MCV: 94 fL (ref 78.0–100.0)
Platelets: 168 10*3/uL (ref 150–400)
RBC: 3.35 MIL/uL — ABNORMAL LOW (ref 4.22–5.81)
RDW: 13.1 % (ref 11.5–15.5)
WBC: 7.6 10*3/uL (ref 4.0–10.5)

## 2016-02-07 LAB — BASIC METABOLIC PANEL
Anion gap: 6 (ref 5–15)
BUN: 28 mg/dL — ABNORMAL HIGH (ref 6–20)
CALCIUM: 8.7 mg/dL — AB (ref 8.9–10.3)
CO2: 28 mmol/L (ref 22–32)
CREATININE: 1.24 mg/dL (ref 0.61–1.24)
Chloride: 104 mmol/L (ref 101–111)
GFR, EST NON AFRICAN AMERICAN: 54 mL/min — AB (ref >60)
Glucose, Bld: 194 mg/dL — ABNORMAL HIGH (ref 65–99)
Potassium: 5.4 mmol/L — ABNORMAL HIGH (ref 3.5–5.1)
SODIUM: 138 mmol/L (ref 135–145)

## 2016-02-14 ENCOUNTER — Encounter (HOSPITAL_COMMUNITY)
Admission: RE | Disposition: A | Discharge status: Home / Self Care | Payer: Self-pay | Source: Ambulatory Visit | Attending: Ophthalmology

## 2016-02-14 ENCOUNTER — Ambulatory Visit (HOSPITAL_COMMUNITY)
Admission: RE | Admit: 2016-02-14 | Discharge: 2016-02-14 | Disposition: A | Discharge status: Home / Self Care | Payer: Medicare Other | Source: Ambulatory Visit | Attending: Ophthalmology | Admitting: Ophthalmology

## 2016-02-14 ENCOUNTER — Ambulatory Visit (HOSPITAL_COMMUNITY): Payer: Medicare Other | Admitting: Anesthesiology

## 2016-02-14 ENCOUNTER — Encounter (HOSPITAL_COMMUNITY): Payer: Self-pay | Admitting: *Deleted

## 2016-02-14 DIAGNOSIS — E119 Type 2 diabetes mellitus without complications: Secondary | ICD-10-CM | POA: Insufficient documentation

## 2016-02-14 DIAGNOSIS — H2511 Age-related nuclear cataract, right eye: Secondary | ICD-10-CM | POA: Diagnosis not present

## 2016-02-14 DIAGNOSIS — Z794 Long term (current) use of insulin: Secondary | ICD-10-CM | POA: Insufficient documentation

## 2016-02-14 DIAGNOSIS — H2512 Age-related nuclear cataract, left eye: Secondary | ICD-10-CM | POA: Insufficient documentation

## 2016-02-14 DIAGNOSIS — Z888 Allergy status to other drugs, medicaments and biological substances status: Secondary | ICD-10-CM | POA: Insufficient documentation

## 2016-02-14 DIAGNOSIS — Z79899 Other long term (current) drug therapy: Secondary | ICD-10-CM | POA: Insufficient documentation

## 2016-02-14 DIAGNOSIS — I2510 Atherosclerotic heart disease of native coronary artery without angina pectoris: Secondary | ICD-10-CM | POA: Insufficient documentation

## 2016-02-14 DIAGNOSIS — Z7982 Long term (current) use of aspirin: Secondary | ICD-10-CM | POA: Insufficient documentation

## 2016-02-14 DIAGNOSIS — I10 Essential (primary) hypertension: Secondary | ICD-10-CM | POA: Insufficient documentation

## 2016-02-14 DIAGNOSIS — I499 Cardiac arrhythmia, unspecified: Secondary | ICD-10-CM | POA: Diagnosis not present

## 2016-02-14 DIAGNOSIS — Z87891 Personal history of nicotine dependence: Secondary | ICD-10-CM | POA: Diagnosis not present

## 2016-02-14 DIAGNOSIS — Z951 Presence of aortocoronary bypass graft: Secondary | ICD-10-CM | POA: Insufficient documentation

## 2016-02-14 DIAGNOSIS — K219 Gastro-esophageal reflux disease without esophagitis: Secondary | ICD-10-CM | POA: Insufficient documentation

## 2016-02-14 DIAGNOSIS — M1990 Unspecified osteoarthritis, unspecified site: Secondary | ICD-10-CM | POA: Insufficient documentation

## 2016-02-14 DIAGNOSIS — H269 Unspecified cataract: Secondary | ICD-10-CM | POA: Diagnosis not present

## 2016-02-14 HISTORY — PX: CATARACT EXTRACTION W/PHACO: SHX586

## 2016-02-14 LAB — GLUCOSE, CAPILLARY: GLUCOSE-CAPILLARY: 103 mg/dL — AB (ref 65–99)

## 2016-02-14 SURGERY — PHACOEMULSIFICATION, CATARACT, WITH IOL INSERTION
Anesthesia: Monitor Anesthesia Care | Site: Eye | Laterality: Left

## 2016-02-14 MED ORDER — EPINEPHRINE HCL 1 MG/ML IJ SOLN
INTRAOCULAR | Status: DC | PRN
  Administered 2016-02-14: 500 mL

## 2016-02-14 MED ORDER — FENTANYL CITRATE (PF) 100 MCG/2ML IJ SOLN
25.0000 ug | INTRAMUSCULAR | Status: AC
  Administered 2016-02-14: 25 ug via INTRAVENOUS
  Filled 2016-02-14: qty 2

## 2016-02-14 MED ORDER — KETOROLAC TROMETHAMINE 0.5 % OP SOLN
1.0000 [drp] | OPHTHALMIC | Status: AC
  Administered 2016-02-14 (×3): 1 [drp] via OPHTHALMIC

## 2016-02-14 MED ORDER — PROVISC 10 MG/ML IO SOLN
INTRAOCULAR | Status: DC | PRN
  Administered 2016-02-14: 0.85 mL via INTRAOCULAR

## 2016-02-14 MED ORDER — EPINEPHRINE HCL 1 MG/ML IJ SOLN
INTRAMUSCULAR | Status: AC
  Filled 2016-02-14: qty 1

## 2016-02-14 MED ORDER — LACTATED RINGERS IV SOLN
INTRAVENOUS | Status: DC
  Administered 2016-02-14: 09:00:00 via INTRAVENOUS

## 2016-02-14 MED ORDER — MIDAZOLAM HCL 2 MG/2ML IJ SOLN
1.0000 mg | INTRAMUSCULAR | Status: DC | PRN
  Administered 2016-02-14: 2 mg via INTRAVENOUS
  Filled 2016-02-14: qty 2

## 2016-02-14 MED ORDER — CYCLOPENTOLATE-PHENYLEPHRINE 0.2-1 % OP SOLN
1.0000 [drp] | OPHTHALMIC | Status: AC
  Administered 2016-02-14 (×3): 1 [drp] via OPHTHALMIC

## 2016-02-14 MED ORDER — PHENYLEPHRINE HCL 2.5 % OP SOLN
1.0000 [drp] | OPHTHALMIC | Status: AC
  Administered 2016-02-14 (×3): 1 [drp] via OPHTHALMIC

## 2016-02-14 MED ORDER — TETRACAINE HCL 0.5 % OP SOLN
1.0000 [drp] | OPHTHALMIC | Status: AC
  Administered 2016-02-14 (×3): 1 [drp] via OPHTHALMIC

## 2016-02-14 MED ORDER — TETRACAINE 0.5 % OP SOLN OPTIME - NO CHARGE
OPHTHALMIC | Status: DC | PRN
  Administered 2016-02-14: 2 [drp] via OPHTHALMIC

## 2016-02-14 MED ORDER — BSS IO SOLN
INTRAOCULAR | Status: DC | PRN
  Administered 2016-02-14: 15 mL

## 2016-02-14 SURGICAL SUPPLY — 10 items
CLOTH BEACON ORANGE TIMEOUT ST (SAFETY) ×2 IMPLANT
EYE SHIELD UNIVERSAL CLEAR (GAUZE/BANDAGES/DRESSINGS) ×2 IMPLANT
GLOVE BIOGEL PI IND STRL 7.0 (GLOVE) IMPLANT
GLOVE BIOGEL PI INDICATOR 7.0 (GLOVE) ×2
GLOVE EXAM NITRILE MD LF STRL (GLOVE) ×2 IMPLANT
LENS ALC ACRYL/TECN (Ophthalmic Related) ×3 IMPLANT
PAD ARMBOARD 7.5X6 YLW CONV (MISCELLANEOUS) ×2 IMPLANT
TAPE SURG TRANSPORE 1 IN (GAUZE/BANDAGES/DRESSINGS) IMPLANT
TAPE SURGICAL TRANSPORE 1 IN (GAUZE/BANDAGES/DRESSINGS) ×2
WATER STERILE IRR 250ML POUR (IV SOLUTION) ×2 IMPLANT

## 2016-02-14 NOTE — Anesthesia Postprocedure Evaluation (Signed)
Anesthesia Post Note  Patient: Grant [name]  Procedure(s) Performed: Procedure(s) (LRB): CATARACT EXTRACTION PHACO AND INTRAOCULAR LENS PLACEMENT (IOC) (Left)  Patient location during evaluation: Short Stay Anesthesia Type: MAC Level of consciousness: awake and alert Pain management: pain level controlled Vital Signs Assessment: post-procedure vital signs reviewed and stable Respiratory status: spontaneous breathing Cardiovascular status: stable Postop Assessment: no signs of nausea or vomiting Anesthetic complications: no    Last Vitals:  Filed Vitals:   02/14/16 0850 02/14/16 0855  BP: 124/44 115/51  Temp:    Resp: 9 11    Last Pain: There were no vitals filed for this visit.               Deandrea Vanpelt

## 2016-02-14 NOTE — Op Note (Signed)
Patient brought to the operating room and prepped and draped in the usual manner.  Lid speculum inserted in left eye.  Stab incision made at the twelve o'clock position.  Provisc instilled in the anterior chamber.   A 2.4 mm. Stab incision was made temporally.  An anterior capsulotomy was done with a bent 25 gauge needle.  The nucleus was hydrodissected.  The Phaco tip was inserted in the anterior chamber and the nucleus was emulsified.  CDE was 6.96.  The cortical material was then removed with the I and A tip.  Posterior capsule was the polished.  The anterior chamber was deepened with Provisc.  A 21.5 Diopter Hoya Model 250 IOL was then inserted in the capsular bag.  Provisc was then removed with the I and A tip.  The wound was then hydrated.  Patient sent to the Recovery Room in good condition with follow up in my office.  Preoperative Diagnosis:  Nuclear Cataract OS Postoperative Diagnosis:  Same Procedure name: Kelman Phacoemulsification OS with IOL

## 2016-02-14 NOTE — H&P (Signed)
The patient was re examined and there is no change in the patients condition since the original H and P. 

## 2016-02-14 NOTE — Anesthesia Preprocedure Evaluation (Signed)
Anesthesia Evaluation  Patient identified by MRN, date of birth, ID band Patient awake    Reviewed: Allergy & Precautions, H&P , NPO status , Patient's Chart, lab work & pertinent test results, reviewed documented beta blocker date and time   Airway Mallampati: II  TM Distance: >3 FB Neck ROM: Full    Dental  (+) Dental Advisory Given, Teeth Intact   Pulmonary neg pulmonary ROS, former smoker,    breath sounds clear to auscultation       Cardiovascular hypertension, Pt. on home beta blockers and Pt. on medications + CAD and + CABG  negative cardio ROS  + dysrhythmias  Rhythm:Regular Rate:Normal     Neuro/Psych negative neurological ROS  negative psych ROS   GI/Hepatic Neg liver ROS, GERD  ,  Endo/Other  diabetes, Type 2, Insulin Dependent  Renal/GU negative Renal ROS     Musculoskeletal negative musculoskeletal ROS (+)   Abdominal   Peds  Hematology negative hematology ROS (+)   Anesthesia Other Findings   Reproductive/Obstetrics negative OB ROS                             Anesthesia Physical Anesthesia Plan  ASA: III  Anesthesia Plan: MAC   Post-op Pain Management:    Induction: Intravenous  Airway Management Planned: Nasal Cannula  Additional Equipment:   Intra-op Plan:   Post-operative Plan:   Informed Consent: I have reviewed the patients History and Physical, chart, labs and discussed the procedure including the risks, benefits and alternatives for the proposed anesthesia with the patient or authorized representative who has indicated his/her understanding and acceptance.     Plan Discussed with:   Anesthesia Plan Comments:         Anesthesia Quick Evaluation

## 2016-02-14 NOTE — Discharge Instructions (Signed)
°  °          [facility] Instructions °[address]- [place] [address] °    ° °1. Avoid closing eyes tightly. One often closes the eye tightly when laughing, talking, sneezing, coughing or if they feel irritated. At these times, you should be careful not to close your eyes tightly. ° °2. Instill eye drops as instructed. To instill drops in your eye, open it, look up and have someone gently pull the lower lid down and instill a couple of drops inside the lower lid. ° °3. Do not touch upper lid. ° °4. Take Advil or Tylenol for pain. ° °5. You may use either eye for near work, such as reading or sewing and you may watch television. ° °6. You may have your hair done at the beauty parlor at any time. ° °7. Wear dark glasses with or without your own glasses if you are in bright light. ° °8. Call our office at [phone] or [phone] if you have sharp pain in your eye or unusual symptoms. ° °9.  FOLLOW UP WITH DR. SHAPIRO TODAY IN HIS [place] OFFICE AT 2:45pm. ° °  °I have received a copy of the above instructions and will follow them.  ° ° ° °IF YOU ARE IN IMMEDIATE DANGER CALL 911! ° °It is important for you to keep your follow-up appointment with your physician after discharge, OR, for you /your caregiver to make a follow-up appointment with your physician / medical provider after discharge. ° °Show these instructions to the next healthcare provider you see. ° Anesthesia, Adult, Care After °Refer to this sheet in the next few weeks. These instructions provide you with information on caring for yourself after your procedure. Your health care provider may also give you more specific instructions. Your treatment has been planned according to current medical practices, but problems sometimes occur. Call your health care provider if you have any problems or questions after your procedure. °WHAT TO EXPECT AFTER THE PROCEDURE °After the procedure, it is typical to  experience: °· Sleepiness. °· Nausea and vomiting. °HOME CARE INSTRUCTIONS °· For the first 24 hours after general anesthesia: °¨ Have a responsible person with you. °¨ Do not drive a car. If you are alone, do not take public transportation. °¨ Do not drink alcohol. °¨ Do not take medicine that has not been prescribed by your health care provider. °¨ Do not sign important papers or make important decisions. °¨ You may resume a normal diet and activities as directed by your health care provider. °· Change bandages (dressings) as directed. °· If you have questions or problems that seem related to general anesthesia, call the hospital and ask for the anesthetist or anesthesiologist on call. °SEEK MEDICAL CARE IF: °· You have nausea and vomiting that continue the day after anesthesia. °· You develop a rash. °SEEK IMMEDIATE MEDICAL CARE IF:  °· You have difficulty breathing. °· You have chest pain. °· You have any allergic problems. °  °This information is not intended to replace advice given to you by your health care provider. Make sure you discuss any questions you have with your health care provider. °  °Document Released: 02/25/2001 Document Revised: 12/10/2014 Document Reviewed: 03/19/2012 °Elsevier Interactive Patient Education ©2016 [employer] ° °

## 2016-02-14 NOTE — Transfer of Care (Signed)
Immediate Anesthesia Transfer of Care Note  Patient: Grant [name]  Procedure(s) Performed: Procedure(s) with comments: CATARACT EXTRACTION PHACO AND INTRAOCULAR LENS PLACEMENT (IOC) (Left) - CDE:6.96  Patient Location: Short Stay  Anesthesia Type:MAC  Level of Consciousness: awake  Airway & Oxygen Therapy: Patient Spontanous Breathing  Post-op Assessment: Report given to RN  Post vital signs: Reviewed  Last Vitals:  Filed Vitals:   02/14/16 0850 02/14/16 0855  BP: 124/44 115/51  Temp:    Resp: 9 11    Complications: No apparent anesthesia complications

## 2016-02-15 ENCOUNTER — Encounter (HOSPITAL_COMMUNITY): Payer: Self-pay | Admitting: Ophthalmology

## 2016-02-17 DIAGNOSIS — B351 Tinea unguium: Secondary | ICD-10-CM | POA: Diagnosis not present

## 2016-02-17 DIAGNOSIS — E1142 Type 2 diabetes mellitus with diabetic polyneuropathy: Secondary | ICD-10-CM | POA: Diagnosis not present

## 2016-02-21 MED ORDER — CYCLOPENTOLATE-PHENYLEPHRINE 0.2-1 % OP SOLN: 1.0000 [drp] | OPHTHALMIC | Status: DC

## 2016-02-21 MED ORDER — PHENYLEPHRINE HCL 2.5 % OP SOLN: 1.0000 [drp] | OPHTHALMIC | Status: DC

## 2016-02-21 MED ORDER — KETOROLAC TROMETHAMINE 0.5 % OP SOLN: 1.0000 [drp] | OPHTHALMIC | Status: DC

## 2016-02-21 MED ORDER — TETRACAINE HCL 0.5 % OP SOLN: 1.0000 [drp] | OPHTHALMIC | Status: DC

## 2016-02-22 ENCOUNTER — Encounter (HOSPITAL_COMMUNITY): Payer: Self-pay

## 2016-02-22 ENCOUNTER — Encounter (HOSPITAL_COMMUNITY)
Admission: RE | Admit: 2016-02-22 | Discharge: 2016-02-22 | Disposition: A | Discharge status: Home / Self Care | Payer: Medicare Other | Source: Ambulatory Visit | Attending: Ophthalmology | Admitting: Ophthalmology

## 2016-02-28 ENCOUNTER — Ambulatory Visit (HOSPITAL_COMMUNITY): Payer: Medicare Other | Admitting: Anesthesiology

## 2016-02-28 ENCOUNTER — Ambulatory Visit (HOSPITAL_COMMUNITY)
Admission: RE | Admit: 2016-02-28 | Discharge: 2016-02-28 | Disposition: A | Discharge status: Home / Self Care | Payer: Medicare Other | Source: Ambulatory Visit | Attending: Ophthalmology | Admitting: Ophthalmology

## 2016-02-28 ENCOUNTER — Encounter (HOSPITAL_COMMUNITY)
Admission: RE | Disposition: A | Discharge status: Home / Self Care | Payer: Self-pay | Source: Ambulatory Visit | Attending: Ophthalmology

## 2016-02-28 ENCOUNTER — Encounter (HOSPITAL_COMMUNITY): Payer: Self-pay | Admitting: *Deleted

## 2016-02-28 DIAGNOSIS — E119 Type 2 diabetes mellitus without complications: Secondary | ICD-10-CM | POA: Diagnosis not present

## 2016-02-28 DIAGNOSIS — H268 Other specified cataract: Secondary | ICD-10-CM | POA: Diagnosis not present

## 2016-02-28 DIAGNOSIS — M1991 Primary osteoarthritis, unspecified site: Secondary | ICD-10-CM | POA: Diagnosis not present

## 2016-02-28 DIAGNOSIS — Z79899 Other long term (current) drug therapy: Secondary | ICD-10-CM | POA: Insufficient documentation

## 2016-02-28 DIAGNOSIS — I2510 Atherosclerotic heart disease of native coronary artery without angina pectoris: Secondary | ICD-10-CM | POA: Insufficient documentation

## 2016-02-28 DIAGNOSIS — Z951 Presence of aortocoronary bypass graft: Secondary | ICD-10-CM | POA: Diagnosis not present

## 2016-02-28 DIAGNOSIS — K219 Gastro-esophageal reflux disease without esophagitis: Secondary | ICD-10-CM | POA: Insufficient documentation

## 2016-02-28 DIAGNOSIS — H2511 Age-related nuclear cataract, right eye: Secondary | ICD-10-CM | POA: Diagnosis not present

## 2016-02-28 DIAGNOSIS — E7800 Pure hypercholesterolemia, unspecified: Secondary | ICD-10-CM | POA: Insufficient documentation

## 2016-02-28 DIAGNOSIS — Z7982 Long term (current) use of aspirin: Secondary | ICD-10-CM | POA: Insufficient documentation

## 2016-02-28 DIAGNOSIS — H269 Unspecified cataract: Secondary | ICD-10-CM | POA: Diagnosis not present

## 2016-02-28 DIAGNOSIS — Z794 Long term (current) use of insulin: Secondary | ICD-10-CM | POA: Diagnosis not present

## 2016-02-28 HISTORY — PX: CATARACT EXTRACTION W/PHACO: SHX586

## 2016-02-28 LAB — GLUCOSE, CAPILLARY: GLUCOSE-CAPILLARY: 107 mg/dL — AB (ref 65–99)

## 2016-02-28 SURGERY — PHACOEMULSIFICATION, CATARACT, WITH IOL INSERTION
Anesthesia: Monitor Anesthesia Care | Site: Eye | Laterality: Right

## 2016-02-28 MED ORDER — ONDANSETRON HCL 4 MG/2ML IJ SOLN: 4.0000 mg | Freq: Once | INTRAMUSCULAR | Status: DC | PRN

## 2016-02-28 MED ORDER — BSS IO SOLN
INTRAOCULAR | Status: DC | PRN
  Administered 2016-02-28: 15 mL

## 2016-02-28 MED ORDER — CYCLOPENTOLATE-PHENYLEPHRINE 0.2-1 % OP SOLN
1.0000 [drp] | OPHTHALMIC | Status: AC
  Administered 2016-02-28 (×3): 1 [drp] via OPHTHALMIC

## 2016-02-28 MED ORDER — TETRACAINE 0.5 % OP SOLN OPTIME - NO CHARGE
OPHTHALMIC | Status: DC | PRN
  Administered 2016-02-28: 2 [drp] via OPHTHALMIC

## 2016-02-28 MED ORDER — KETOROLAC TROMETHAMINE 0.5 % OP SOLN
1.0000 [drp] | OPHTHALMIC | Status: AC
  Administered 2016-02-28 (×3): 1 [drp] via OPHTHALMIC

## 2016-02-28 MED ORDER — MIDAZOLAM HCL 2 MG/2ML IJ SOLN
INTRAMUSCULAR | Status: AC
  Filled 2016-02-28: qty 2

## 2016-02-28 MED ORDER — EPINEPHRINE HCL 1 MG/ML IJ SOLN
INTRAOCULAR | Status: DC | PRN
  Administered 2016-02-28: 500 mL

## 2016-02-28 MED ORDER — FENTANYL CITRATE (PF) 100 MCG/2ML IJ SOLN: 25.0000 ug | INTRAMUSCULAR | Status: DC | PRN

## 2016-02-28 MED ORDER — TETRACAINE HCL 0.5 % OP SOLN
1.0000 [drp] | OPHTHALMIC | Status: AC
  Administered 2016-02-28 (×3): 1 [drp] via OPHTHALMIC

## 2016-02-28 MED ORDER — MIDAZOLAM HCL 2 MG/2ML IJ SOLN
1.0000 mg | INTRAMUSCULAR | Status: DC | PRN
  Administered 2016-02-28: 2 mg via INTRAVENOUS

## 2016-02-28 MED ORDER — PROVISC 10 MG/ML IO SOLN
INTRAOCULAR | Status: DC | PRN
  Administered 2016-02-28: 0.85 mL via INTRAOCULAR

## 2016-02-28 MED ORDER — EPINEPHRINE HCL 1 MG/ML IJ SOLN
INTRAMUSCULAR | Status: AC
  Filled 2016-02-28: qty 1

## 2016-02-28 MED ORDER — LACTATED RINGERS IV SOLN
INTRAVENOUS | Status: DC
  Administered 2016-02-28: 08:00:00 via INTRAVENOUS

## 2016-02-28 MED ORDER — FENTANYL CITRATE (PF) 100 MCG/2ML IJ SOLN
INTRAMUSCULAR | Status: AC
  Filled 2016-02-28: qty 2

## 2016-02-28 MED ORDER — PHENYLEPHRINE HCL 2.5 % OP SOLN
1.0000 [drp] | OPHTHALMIC | Status: AC
  Administered 2016-02-28 (×3): 1 [drp] via OPHTHALMIC

## 2016-02-28 MED ORDER — FENTANYL CITRATE (PF) 100 MCG/2ML IJ SOLN
25.0000 ug | INTRAMUSCULAR | Status: AC
  Administered 2016-02-28: 25 ug via INTRAVENOUS

## 2016-02-28 SURGICAL SUPPLY — 10 items
CLOTH BEACON ORANGE TIMEOUT ST (SAFETY) ×2 IMPLANT
EYE SHIELD UNIVERSAL CLEAR (GAUZE/BANDAGES/DRESSINGS) ×2 IMPLANT
GLOVE BIOGEL PI IND STRL 6.5 (GLOVE) IMPLANT
GLOVE BIOGEL PI INDICATOR 6.5 (GLOVE) ×2
GLOVE EXAM NITRILE MD LF STRL (GLOVE) ×2 IMPLANT
LENS ALC ACRYL/TECN (Ophthalmic Related) ×3 IMPLANT
PAD ARMBOARD 7.5X6 YLW CONV (MISCELLANEOUS) ×2 IMPLANT
TAPE SURG TRANSPORE 1 IN (GAUZE/BANDAGES/DRESSINGS) IMPLANT
TAPE SURGICAL TRANSPORE 1 IN (GAUZE/BANDAGES/DRESSINGS) ×2
WATER STERILE IRR 250ML POUR (IV SOLUTION) ×2 IMPLANT

## 2016-02-28 NOTE — H&P (Signed)
The patient was re examined and there is no change in the patients condition since the original H and P. 

## 2016-02-28 NOTE — Transfer of Care (Signed)
Immediate Anesthesia Transfer of Care Note  Patient: Grant [name]  Procedure(s) Performed: Procedure(s) with comments: CATARACT EXTRACTION PHACO AND INTRAOCULAR LENS PLACEMENT (IOC) (Right) - CDE:9.54  Patient Location: Short Stay  Anesthesia Type:MAC  Level of Consciousness: awake  Airway & Oxygen Therapy: Patient Spontanous Breathing  Post-op Assessment: Report given to RN  Post vital signs: Reviewed  Last Vitals: There were no vitals filed for this visit.  Complications: No apparent anesthesia complications

## 2016-02-28 NOTE — Anesthesia Postprocedure Evaluation (Signed)
Anesthesia Post Note  Patient: Grant [name]  Procedure(s) Performed: Procedure(s) (LRB): CATARACT EXTRACTION PHACO AND INTRAOCULAR LENS PLACEMENT (IOC) (Right)  Patient location during evaluation: Short Stay Anesthesia Type: MAC Level of consciousness: awake and alert and awake Pain management: pain level controlled Respiratory status: spontaneous breathing Cardiovascular status: blood pressure returned to baseline Postop Assessment: no signs of nausea or vomiting Anesthetic complications: no    Last Vitals: There were no vitals filed for this visit.  Last Pain: There were no vitals filed for this visit.               Leanord Thibeau

## 2016-02-28 NOTE — Discharge Instructions (Signed)
°  °          [facility] Instructions °[address]- [place] [address]-[place] °    ° °1. Avoid closing eyes tightly. One often closes the eye tightly when laughing, talking, sneezing, coughing or if they feel irritated. At these times, you should be careful not to close your eyes tightly. ° °2. Instill eye drops as instructed. To instill drops in your eye, open it, look up and have someone gently pull the lower lid down and instill a couple of drops inside the lower lid. ° °3. Do not touch upper lid. ° °4. Take Advil or Tylenol for pain. ° °5. You may use either eye for near work, such as reading or sewing and you may watch television. ° °6. You may have your hair done at the beauty parlor at any time. ° °7. Wear dark glasses with or without your own glasses if you are in bright light. ° °8. Call our office at [phone] or [phone] if you have sharp pain in your eye or unusual symptoms. ° °9.  FOLLOW UP WITH DR. SHAPIRO TODAY IN HIS [place] OFFICE AT 2:45pm. ° °  °I have received a copy of the above instructions and will follow them.  ° ° ° °IF YOU ARE IN IMMEDIATE DANGER CALL 911! ° °It is important for you to keep your follow-up appointment with your physician after discharge, OR, for you /your caregiver to make a follow-up appointment with your physician / medical provider after discharge. ° °Show these instructions to the next healthcare provider you see. ° °

## 2016-02-28 NOTE — Anesthesia Preprocedure Evaluation (Signed)
Anesthesia Evaluation  Patient identified by MRN, date of birth, ID band Patient awake    Reviewed: Allergy & Precautions, H&P , NPO status , Patient's Chart, lab work & pertinent test results, reviewed documented beta blocker date and time   Airway Mallampati: II  TM Distance: >3 FB Neck ROM: Full    Dental  (+) Dental Advisory Given, Teeth Intact   Pulmonary neg pulmonary ROS, former smoker,    breath sounds clear to auscultation       Cardiovascular hypertension, Pt. on home beta blockers and Pt. on medications + CAD and + CABG  negative cardio ROS  + dysrhythmias  Rhythm:Regular Rate:Normal     Neuro/Psych negative neurological ROS  negative psych ROS   GI/Hepatic Neg liver ROS, GERD  ,  Endo/Other  diabetes, Type 2, Insulin Dependent  Renal/GU negative Renal ROS     Musculoskeletal negative musculoskeletal ROS (+)   Abdominal   Peds  Hematology negative hematology ROS (+)   Anesthesia Other Findings   Reproductive/Obstetrics negative OB ROS                             Anesthesia Physical Anesthesia Plan  ASA: III  Anesthesia Plan: MAC   Post-op Pain Management:    Induction: Intravenous  Airway Management Planned: Nasal Cannula  Additional Equipment:   Intra-op Plan:   Post-operative Plan:   Informed Consent: I have reviewed the patients History and Physical, chart, labs and discussed the procedure including the risks, benefits and alternatives for the proposed anesthesia with the patient or authorized representative who has indicated his/her understanding and acceptance.     Plan Discussed with:   Anesthesia Plan Comments:         Anesthesia Quick Evaluation

## 2016-02-28 NOTE — Op Note (Signed)
Patient brought to the operating room and prepped and draped in the usual manner.  Lid speculum inserted in right eye.  Stab incision made at the twelve o'clock position.  Provisc instilled in the anterior chamber.   A 2.4 mm. Stab incision was made temporally.  An anterior capsulotomy was done with a bent 25 gauge needle.  The nucleus was hydrodissected.  The Phaco tip was inserted in the anterior chamber and the nucleus was emulsified.  CDE was 9.54.  The cortical material was then removed with the I and A tip.  Posterior capsule was the polished.  The anterior chamber was deepened with Provisc.  A 22.5 Diopter Hoya Model 250 IOL was then inserted in the capsular bag.  Provisc was then removed with the I and A tip.  The wound was then hydrated.  Patient sent to the Recovery Room in good condition with follow up in my office.  Preoperative Diagnosis:  Nuclear Cataract OD Postoperative Diagnosis:  Same Procedure name: Kelman Phacoemulsification with IOL

## 2016-02-29 ENCOUNTER — Encounter (HOSPITAL_COMMUNITY): Payer: Self-pay | Admitting: Ophthalmology

## 2016-03-05 DIAGNOSIS — E663 Overweight: Secondary | ICD-10-CM | POA: Diagnosis not present

## 2016-03-05 DIAGNOSIS — Z6827 Body mass index (BMI) 27.0-27.9, adult: Secondary | ICD-10-CM | POA: Diagnosis not present

## 2016-03-05 DIAGNOSIS — R6889 Other general symptoms and signs: Secondary | ICD-10-CM | POA: Diagnosis not present

## 2016-03-05 DIAGNOSIS — J180 Bronchopneumonia, unspecified organism: Secondary | ICD-10-CM | POA: Diagnosis not present

## 2016-03-05 DIAGNOSIS — B349 Viral infection, unspecified: Secondary | ICD-10-CM | POA: Diagnosis not present

## 2016-03-05 DIAGNOSIS — M25552 Pain in left hip: Secondary | ICD-10-CM | POA: Diagnosis not present

## 2016-03-15 ENCOUNTER — Other Ambulatory Visit: Payer: Self-pay | Admitting: "Endocrinology

## 2016-03-15 DIAGNOSIS — E1159 Type 2 diabetes mellitus with other circulatory complications: Secondary | ICD-10-CM | POA: Diagnosis not present

## 2016-03-15 LAB — BASIC METABOLIC PANEL
BUN: 25 mg/dL (ref 7–25)
CALCIUM: 8.7 mg/dL (ref 8.6–10.3)
CO2: 22 mmol/L (ref 20–31)
Chloride: 104 mmol/L (ref 98–110)
Creat: 1.75 mg/dL — ABNORMAL HIGH (ref 0.70–1.11)
GLUCOSE: 104 mg/dL — AB (ref 65–99)
Potassium: 5.5 mmol/L — ABNORMAL HIGH (ref 3.5–5.3)
SODIUM: 139 mmol/L (ref 135–146)

## 2016-03-16 LAB — HEMOGLOBIN A1C
HEMOGLOBIN A1C: 6.6 % — AB (ref <5.7)
Mean Plasma Glucose: 143 mg/dL

## 2016-03-23 ENCOUNTER — Ambulatory Visit: Payer: Medicare Other | Admitting: "Endocrinology

## 2016-03-28 ENCOUNTER — Encounter: Payer: Self-pay | Admitting: "Endocrinology

## 2016-03-28 ENCOUNTER — Ambulatory Visit (INDEPENDENT_AMBULATORY_CARE_PROVIDER_SITE_OTHER): Payer: Medicare Other | Admitting: "Endocrinology

## 2016-03-28 VITALS — BP 118/68 | HR 68 | Ht 70.0 in | Wt 181.0 lb

## 2016-03-28 DIAGNOSIS — E785 Hyperlipidemia, unspecified: Secondary | ICD-10-CM | POA: Diagnosis not present

## 2016-03-28 DIAGNOSIS — I10 Essential (primary) hypertension: Secondary | ICD-10-CM | POA: Diagnosis not present

## 2016-03-28 DIAGNOSIS — E1159 Type 2 diabetes mellitus with other circulatory complications: Secondary | ICD-10-CM | POA: Diagnosis not present

## 2016-03-28 NOTE — Progress Notes (Signed)
Subjective:    Patient ID: Grant [name], male    DOB: 04-16-1934,    Past Medical History  Diagnosis Date  . Macular degeneration 09-09-13    bilateral"vision is poor"  . Diabetes mellitus   . Hypertension   . GERD (gastroesophageal reflux disease)   . Gout 09-09-13    last flare 8'14 with gallbladder attack at [facility]  . Arthritis   . Cancer ([place])     skin cancer of forehead  . Hyperlipidemia   . Erectile dysfunction   . RBBB   . CAD (coronary artery disease)   . Pancreatitis 10--8-14    8'14 -enzymes improved  . Psoriasis 09-09-13    elbows, knees   Past Surgical History  Procedure Laterality Date  . Coronary artery bypass graft  02-28-2011    LIMA to LAD,SVG to acute marginal branch of RCA & sequential SVG to OM1 & OM2.  . Pilonidal cyst excision    . Colonoscopy  02/25/2012    Procedure: COLONOSCOPY;  Surgeon: Daneil Dolin, MD;  Location: [facility];  Service: Endoscopy;  Laterality: N/A;  7:30 AM polyps removed.  . US echocardiography  11/09/2008    mildly impaired diastolic dysfunction - Grade I  . Nm myocar perf wall motion  02/15/2011    High Risk  . Cardiac catheterization  02/19/2011    3 vessel CAD  . Cholecystectomy N/A 10/27/2013    Procedure: LAPAROSCOPIC CHOLECYSTECTOMY WITH INTRAOPERATIVE CHOLANGIOGRAM WITH LIVER BIOPSY;  Surgeon: Pedro Earls, MD;  Location: [facility];  Service: General;  Laterality: N/A;  . Colonoscopy N/A 02/11/2015    Procedure: COLONOSCOPY;  Surgeon: Daneil Dolin, MD;  Location: [facility];  Service: Endoscopy;  Laterality: N/A;  830   . Cataract extraction w/phaco Left 02/14/2016    Procedure: CATARACT EXTRACTION PHACO AND INTRAOCULAR LENS PLACEMENT (IOC);  Surgeon: Rutherford Guys, MD;  Location: [facility] ORS;  Service: Ophthalmology;  Laterality: Left;  CDE:6.96  . Cataract extraction w/phaco Right 02/28/2016    Procedure: CATARACT EXTRACTION PHACO AND INTRAOCULAR LENS PLACEMENT (IOC);  Surgeon: Rutherford Guys, MD;  Location: [facility] OR[facility];   Service: Ophthalmology;  Laterality: Right;  CDE:9.54   Social History   Social History  . Marital Status: Married    Spouse Name: N/A  . Number of Children: N/A  . Years of Education: N/A   Social History Main Topics  . Smoking status: Former Smoker -- 0.50 packs/day for 25 years    Types: Cigarettes    Quit date: 02/25/1983  . Smokeless tobacco: Never Used  . Alcohol Use: Yes     Comment: occasional  . Drug Use: No  . Sexual Activity: No   Other Topics Concern  . None   Social History Narrative   Outpatient Encounter Prescriptions as of 03/28/2016  Medication Sig  . allopurinol (ZYLOPRIM) 100 MG tablet Take 100 mg by mouth daily with breakfast.   . aspirin 325 MG tablet Take 325 mg by mouth daily.  Marland Kitchen atorvastatin (LIPITOR) 40 MG tablet Take 40 mg by mouth every evening.   . betamethasone dipropionate (DIPROLENE) 0.05 % cream Apply 1 application topically daily as needed (siriasis).   . carvedilol (COREG) 12.5 MG tablet Take 12.5 mg by mouth 2 (two) times daily with a meal.  . escitalopram (LEXAPRO) 10 MG tablet Take 10 mg by mouth daily.  . Insulin Glargine (LANTUS SOLOSTAR) 100 UNIT/ML Solostar Pen Inject 15 Units into the skin at bedtime.  . Misc  Natural Products (TART CHERRY ADVANCED PO) Take 1,200 mg by mouth daily.  . Multiple Vitamins-Iron (MULTIVITAMINS WITH IRON) TABS Take 1 tablet by mouth daily.  . Multiple Vitamins-Minerals (PRESERVISION AREDS PO) Take 1 capsule by mouth 2 (two) times daily.   . naproxen sodium (ANAPROX) 220 MG tablet Take 220 mg by mouth daily as needed (headache).  . polyethylene glycol-electrolytes (TRILYTE) 420 G solution Take 4,000 mLs by mouth as directed.  . quinapril (ACCUPRIL) 20 MG tablet Take 20 mg by mouth every morning.   . ranitidine (ZANTAC) 150 MG tablet Take 150 mg by mouth 2 (two) times daily.  . [DISCONTINUED] metFORMIN (GLUCOPHAGE) 500 MG tablet Take 500 mg by mouth 2 (two) times daily.    No facility-administered encounter  medications on file as of 03/28/2016.   ALLERGIES: Allergies  Allergen Reactions  . Amiodarone Nausea Only   VACCINATION STATUS: Immunization History  Administered Date(s) Administered  . Influenza-Unspecified 09/02/2013    Diabetes He presents for his follow-up diabetic visit. He has type 2 diabetes mellitus. Onset time: He was diagnosed at approximate age of 75 years. His disease course has been improving. There are no hypoglycemic associated symptoms. Pertinent negatives for hypoglycemia include no confusion, headaches, pallor or seizures. There are no diabetic associated symptoms. Pertinent negatives for diabetes include no chest pain, no fatigue, no polydipsia, no polyphagia, no polyuria and no weakness. There are no hypoglycemic complications. Symptoms are improving. Diabetic complications include heart disease. Risk factors for coronary artery disease include dyslipidemia, diabetes mellitus, hypertension and tobacco exposure. He is compliant with treatment most of the time. His weight is stable. He is following a generally healthy diet. He participates in exercise intermittently. His home blood glucose trend is decreasing steadily. His breakfast blood glucose range is generally 130-140 mg/dl. His overall blood glucose range is 130-140 mg/dl. Eye exam is current.  Hypertension This is a chronic problem. The current episode started more than 1 year ago. Pertinent negatives include no chest pain, headaches, neck pain, palpitations or shortness of breath. Hypertensive end-organ damage includes CAD/MI.  Hyperlipidemia This is a chronic problem. The current episode started more than 1 year ago. The problem is controlled. Pertinent negatives include no chest pain, myalgias or shortness of breath.     Review of Systems  Constitutional: Positive for unexpected weight change. Negative for fatigue.  HENT: Negative for dental problem, mouth sores and trouble swallowing.   Eyes: Negative for visual  disturbance.  Respiratory: Negative for cough, choking, chest tightness, shortness of breath and wheezing.   Cardiovascular: Negative for chest pain, palpitations and leg swelling.  Gastrointestinal: Negative for nausea, vomiting, abdominal pain, diarrhea, constipation and abdominal distention.  Endocrine: Negative for polydipsia, polyphagia and polyuria.  Genitourinary: Negative for dysuria, urgency, hematuria and flank pain.  Musculoskeletal: Negative for myalgias, back pain, gait problem and neck pain.  Skin: Negative for pallor, rash and wound.  Neurological: Negative for seizures, syncope, weakness, numbness and headaches.  Psychiatric/Behavioral: Negative.  Negative for confusion and dysphoric mood.    Objective:    BP 118/68 mmHg  Pulse 68  Ht 5\' 10"  (1.778 m)  Wt 181 lb (82.101 kg)  BMI 25.97 kg/m2  SpO2 96%  Wt Readings from Last 3 Encounters:  03/28/16 181 lb (82.101 kg)  02/07/16 198 lb (89.812 kg)  12/23/15 196 lb (88.905 kg)    Physical Exam  Constitutional: He is oriented to person, place, and time. He appears well-developed and well-nourished. He is cooperative. No distress.  HENT:  Head: Normocephalic and atraumatic.  Eyes: EOM are normal.  Neck: Normal range of motion. Neck supple. No tracheal deviation present. No thyromegaly present.  Cardiovascular: Normal rate, S1 normal, S2 normal and normal heart sounds.  Exam reveals no gallop.   No murmur heard. Pulses:      Dorsalis pedis pulses are 1+ on the right side, and 1+ on the left side.       Posterior tibial pulses are 1+ on the right side, and 1+ on the left side.  Pulmonary/Chest: Breath sounds normal. No respiratory distress. He has no wheezes.  Abdominal: Soft. Bowel sounds are normal. He exhibits no distension. There is no tenderness. There is no guarding and no CVA tenderness.  Musculoskeletal: He exhibits no edema.       Right shoulder: He exhibits no swelling and no deformity.  Neurological: He is  alert and oriented to person, place, and time. He has normal strength and normal reflexes. No cranial nerve deficit or sensory deficit. Gait normal.  Skin: Skin is warm and dry. No rash noted. No cyanosis. Nails show no clubbing.  Psychiatric: He has a normal mood and affect. His speech is normal and behavior is normal. Judgment and thought content normal. Cognition and memory are normal.    Results for orders placed or performed in visit on [id number]  Basic metabolic panel  Result Value Ref Range   Sodium 139 135 - 146 mmol/L   Potassium 5.5 (H) 3.5 - 5.3 mmol/L   Chloride 104 98 - 110 mmol/L   CO2 22 20 - 31 mmol/L   Glucose, Bld 104 (H) 65 - 99 mg/dL   BUN 25 7 - 25 mg/dL   Creat 1.75 (H) 0.70 - 1.11 mg/dL   Calcium 8.7 8.6 - 10.3 mg/dL  Hemoglobin A1c  Result Value Ref Range   Hgb A1c MFr Bld 6.6 (H) <5.7 %   Mean Plasma Glucose 143 mg/dL   Complete Blood Count (Most recent): Lab Results  Component Value Date   WBC 7.6 02/07/2016   HGB 10.5* 02/07/2016   HCT 31.5* 02/07/2016   MCV 94.0 02/07/2016   PLT 168 02/07/2016   Chemistry (most recent): Lab Results  Component Value Date   NA 139 03/15/2016   K 5.5* 03/15/2016   CL 104 03/15/2016   CO2 22 03/15/2016   BUN 25 03/15/2016   CREATININE 1.75* 03/15/2016   Diabetic Labs (most recent): Lab Results  Component Value Date   HGBA1C 6.6* 03/15/2016   HGBA1C 6.9* 09/08/2015   HGBA1C 8.8* 10/27/2013   Lipid profile (most recent): No results found for: TRIG, CHOL       Assessment & Plan:   1. Type 2 diabetes mellitus with vascular disease ([place])  his diabetes is  complicated by coronary artery disease. Patient came with stable glucose profile, and  recent A1c of 6.6 %.  Glucose logs and insulin administration records pertaining to this visit,  to be scanned into patient's records.  Recent labs reviewed, His creatinine increasing to 1.75. - Patient remains at a high risk for more acute and chronic complications of  diabetes which include CAD, CVA, CKD, retinopathy, and neuropathy. These are all discussed in detail with the patient.  - I have re-counseled the patient on diet management and   by adopting a carbohydrate restricted / protein rich  Diet. - Patient is advised to stick to a routine mealtimes to eat 3 meals  a day and avoid unnecessary snacks ( to snack only  to correct hypoglycemia).  - Suggestion is made for patient to avoid simple carbohydrates   from their diet including Cakes , Desserts, Ice Cream,  Soda (  diet and regular) , Sweet Tea , Candies,  Chips, Cookies, Artificial Sweeteners,   and "Sugar-free" Products .  This will help patient to have stable blood glucose profile and potentially avoid unintended  Weight gain.  - The patient  has been  scheduled with Jearld Fenton, RDN, CDE for individualized DM education. - I have approached patient with the following individualized plan to manage diabetes and patient agrees.  -I will discontinue Metformin  for now due to CK D.  - I will continue Lantus 15 units qhs, monitor before breakfast everyday. Target A1c for him would be between 7 and 7.5%.   - Patient specific target  for A1c; LDL, HDL, Triglycerides, and  Waist Circumference were discussed in detail.  2) BP/HTN: Controlled. Continue current medications including ACEI. 3) Lipids/HPL: continue statins. 4)  Weight/Diet: CDE consult in progress, exercise, and carbohydrates information provided.  5) Chronic Care/Health Maintenance:  -Patient  on ACEI and Statin medications and encouraged to continue to follow up with Ophthalmology, Podiatrist at least yearly or according to recommendations, and advised to  stay away from smoking. I have recommended yearly flu vaccine and pneumonia vaccination at least every 5 years; moderate intensity exercise for up to 150 minutes weekly; and  sleep for at least 7 hours a day.  I advised patient to maintain close follow up with their PCP for primary care  needs.  Patient is asked to bring meter and  blood glucose logs during their next visit.   Follow up plan: Return in about 3 months (around 06/27/2016) for diabetes, high blood pressure, high cholesterol, follow up with pre-visit labs, meter, and logs.  Glade Lloyd, MD Phone: [phone]  Fax: [phone]   03/28/2016, 12:04 PM

## 2016-03-28 NOTE — Patient Instructions (Signed)

## 2016-03-29 DIAGNOSIS — I10 Essential (primary) hypertension: Secondary | ICD-10-CM | POA: Diagnosis not present

## 2016-03-29 DIAGNOSIS — R300 Dysuria: Secondary | ICD-10-CM | POA: Diagnosis not present

## 2016-03-29 DIAGNOSIS — Z6826 Body mass index (BMI) 26.0-26.9, adult: Secondary | ICD-10-CM | POA: Diagnosis not present

## 2016-03-29 DIAGNOSIS — E1159 Type 2 diabetes mellitus with other circulatory complications: Secondary | ICD-10-CM | POA: Diagnosis not present

## 2016-03-29 DIAGNOSIS — E119 Type 2 diabetes mellitus without complications: Secondary | ICD-10-CM | POA: Diagnosis not present

## 2016-03-30 DIAGNOSIS — R5383 Other fatigue: Secondary | ICD-10-CM | POA: Diagnosis not present

## 2016-03-30 DIAGNOSIS — R972 Elevated prostate specific antigen [PSA]: Secondary | ICD-10-CM | POA: Diagnosis not present

## 2016-03-30 DIAGNOSIS — D649 Anemia, unspecified: Secondary | ICD-10-CM | POA: Diagnosis not present

## 2016-03-30 DIAGNOSIS — R634 Abnormal weight loss: Secondary | ICD-10-CM | POA: Diagnosis not present

## 2016-03-30 DIAGNOSIS — E538 Deficiency of other specified B group vitamins: Secondary | ICD-10-CM | POA: Diagnosis not present

## 2016-04-04 DIAGNOSIS — Z9889 Other specified postprocedural states: Secondary | ICD-10-CM | POA: Diagnosis not present

## 2016-04-04 DIAGNOSIS — R634 Abnormal weight loss: Secondary | ICD-10-CM | POA: Diagnosis not present

## 2016-04-06 DIAGNOSIS — E119 Type 2 diabetes mellitus without complications: Secondary | ICD-10-CM | POA: Diagnosis not present

## 2016-04-06 DIAGNOSIS — Z6826 Body mass index (BMI) 26.0-26.9, adult: Secondary | ICD-10-CM | POA: Diagnosis not present

## 2016-04-06 DIAGNOSIS — E663 Overweight: Secondary | ICD-10-CM | POA: Diagnosis not present

## 2016-04-06 DIAGNOSIS — Z1389 Encounter for screening for other disorder: Secondary | ICD-10-CM | POA: Diagnosis not present

## 2016-04-06 DIAGNOSIS — J209 Acute bronchitis, unspecified: Secondary | ICD-10-CM | POA: Diagnosis not present

## 2016-04-06 DIAGNOSIS — E1159 Type 2 diabetes mellitus with other circulatory complications: Secondary | ICD-10-CM | POA: Diagnosis not present

## 2016-04-06 DIAGNOSIS — I10 Essential (primary) hypertension: Secondary | ICD-10-CM | POA: Diagnosis not present

## 2016-04-06 DIAGNOSIS — E782 Mixed hyperlipidemia: Secondary | ICD-10-CM | POA: Diagnosis not present

## 2016-04-06 DIAGNOSIS — J018 Other acute sinusitis: Secondary | ICD-10-CM | POA: Diagnosis not present

## 2016-04-11 ENCOUNTER — Ambulatory Visit: Payer: Medicare Other | Admitting: Nutrition

## 2016-04-19 DIAGNOSIS — D631 Anemia in chronic kidney disease: Secondary | ICD-10-CM | POA: Diagnosis not present

## 2016-04-19 DIAGNOSIS — N183 Chronic kidney disease, stage 3 (moderate): Secondary | ICD-10-CM | POA: Diagnosis not present

## 2016-04-19 DIAGNOSIS — I129 Hypertensive chronic kidney disease with stage 1 through stage 4 chronic kidney disease, or unspecified chronic kidney disease: Secondary | ICD-10-CM | POA: Diagnosis not present

## 2016-04-19 DIAGNOSIS — R809 Proteinuria, unspecified: Secondary | ICD-10-CM | POA: Diagnosis not present

## 2016-04-19 DIAGNOSIS — E1122 Type 2 diabetes mellitus with diabetic chronic kidney disease: Secondary | ICD-10-CM | POA: Diagnosis not present

## 2016-04-25 ENCOUNTER — Other Ambulatory Visit (HOSPITAL_COMMUNITY): Payer: Self-pay | Admitting: Nephrology

## 2016-04-25 DIAGNOSIS — N183 Chronic kidney disease, stage 3 (moderate): Secondary | ICD-10-CM

## 2016-04-27 DIAGNOSIS — E1142 Type 2 diabetes mellitus with diabetic polyneuropathy: Secondary | ICD-10-CM | POA: Diagnosis not present

## 2016-04-27 DIAGNOSIS — E875 Hyperkalemia: Secondary | ICD-10-CM | POA: Diagnosis not present

## 2016-04-27 DIAGNOSIS — B351 Tinea unguium: Secondary | ICD-10-CM | POA: Diagnosis not present

## 2016-04-27 DIAGNOSIS — N179 Acute kidney failure, unspecified: Secondary | ICD-10-CM | POA: Diagnosis not present

## 2016-05-02 ENCOUNTER — Ambulatory Visit (HOSPITAL_COMMUNITY)
Admission: RE | Admit: 2016-05-02 | Discharge: 2016-05-02 | Disposition: A | Discharge status: Home / Self Care | Payer: Medicare Other | Source: Ambulatory Visit | Attending: Nephrology | Admitting: Nephrology

## 2016-05-02 DIAGNOSIS — N183 Chronic kidney disease, stage 3 (moderate): Secondary | ICD-10-CM | POA: Insufficient documentation

## 2016-05-02 DIAGNOSIS — N200 Calculus of kidney: Secondary | ICD-10-CM | POA: Diagnosis not present

## 2016-05-04 ENCOUNTER — Encounter: Payer: Self-pay | Admitting: Internal Medicine

## 2016-05-04 ENCOUNTER — Ambulatory Visit (INDEPENDENT_AMBULATORY_CARE_PROVIDER_SITE_OTHER): Payer: Medicare Other | Admitting: Internal Medicine

## 2016-05-04 VITALS — BP 111/59 | HR 59 | Temp 98.2°F | Ht 72.0 in | Wt 181.4 lb

## 2016-05-04 DIAGNOSIS — R634 Abnormal weight loss: Secondary | ICD-10-CM

## 2016-05-04 DIAGNOSIS — K219 Gastro-esophageal reflux disease without esophagitis: Secondary | ICD-10-CM | POA: Diagnosis not present

## 2016-05-04 LAB — CBC WITH DIFFERENTIAL/PLATELET
BASOS PCT: 0 %
Basophils Absolute: 0 cells/uL (ref 0–200)
Eosinophils Absolute: 220 cells/uL (ref 15–500)
Eosinophils Relative: 4 %
HEMATOCRIT: 32.5 % — AB (ref 38.5–50.0)
Hemoglobin: 10.7 g/dL — ABNORMAL LOW (ref 13.2–17.1)
LYMPHS PCT: 29 %
Lymphs Abs: 1595 cells/uL (ref 850–3900)
MCH: 30.3 pg (ref 27.0–33.0)
MCHC: 32.9 g/dL (ref 32.0–36.0)
MCV: 92.1 fL (ref 80.0–100.0)
MONO ABS: 385 {cells}/uL (ref 200–950)
MONOS PCT: 7 %
MPV: 10.8 fL (ref 7.5–12.5)
NEUTROS ABS: 3300 {cells}/uL (ref 1500–7800)
Neutrophils Relative %: 60 %
PLATELETS: 154 10*3/uL (ref 140–400)
RBC: 3.53 MIL/uL — AB (ref 4.20–5.80)
RDW: 15 % (ref 11.0–15.0)
WBC: 5.5 10*3/uL (ref 3.8–10.8)

## 2016-05-04 NOTE — Patient Instructions (Addendum)
Schedule diagnostic  EGD - GERD ; weight loss;  Conscious sedation  GERD information  Decrease lantus to 5 units the night before the procedure  TSH, CMP and CBC today  Further recommendations to follow

## 2016-05-04 NOTE — Progress Notes (Signed)
Primary Care Physician:  Glo Herring., MD Primary Gastroenterologist:  Dr. Gala Romney Pre-Procedure History & Physical: HPI:  Grant [name] is a 82 y.o. male here for evaluation of weight loss in a background of long-standing GERD. Mr. Catts was last seen by [place] in February of last year. History multiple colon polyps. Last colonoscopy was in March 2016. Multiple small simple adenomas and inflammatory polyps removed. Exam felt to be adequate. No future colonoscopy recommended. Patient is noted to have some weight loss earlier this year; weight hovering around 198 pounds back in March of this year; on 03/28/2016, he weighed 181 pounds. He weighs 181.4 pounds today. Patient states he was down with bronchitis back in March. He rebounded from that and he and his wife he started walking about 25 minutes every day which -   [place].  Patient's that have mild chronic kidney disease followed by nephrologist in [place].  More recently, he feels good;  hasn't had any fever or chills.; denies odynophagia or dysphagia. No early satiety, nausea or vomiting. He does have lifelong reflux symptoms for which he's taking various over-the-counter H2 blockers and antacids products;  never had an EGD. He denies melena or rectal bleeding. Bowel function reportedly normal. Because of weight loss, he was taken off metformin recently. He does continue taking Lantus insulin 15 units at bedtime. Hemoglobin A1c is 6.6 range.  The patient is unsure whether or not he has had  his thyroid checked recently. I cannot find any results in Epic at this time.  Past Medical History  Diagnosis Date  . Macular degeneration 09-09-13    bilateral"vision is poor"  . Diabetes mellitus   . Hypertension   . GERD (gastroesophageal reflux disease)   . Gout 09-09-13    last flare 8'14 with gallbladder attack at [facility]  . Arthritis   . Cancer ([place])     skin cancer of forehead  . Hyperlipidemia   . Erectile  dysfunction   . RBBB   . CAD (coronary artery disease)   . Pancreatitis 10--8-14    8'14 -enzymes improved  . Psoriasis 09-09-13    elbows, knees  . Tubular adenoma     Past Surgical History  Procedure Laterality Date  . Coronary artery bypass graft  02-28-2011    LIMA to LAD,SVG to acute marginal branch of RCA & sequential SVG to OM1 & OM2.  . Pilonidal cyst excision    . Colonoscopy  02/25/2012    Procedure: COLONOSCOPY;  Surgeon: Daneil Dolin, MD;  [facility];  Service: Endoscopy;  Laterality: N/A;  7:30 AM polyps removed.  . US echocardiography  11/09/2008    mildly impaired diastolic dysfunction - Grade I  . Nm myocar perf wall motion  02/15/2011    High Risk  . Cardiac catheterization  02/19/2011    3 vessel CAD  . Cholecystectomy N/A 10/27/2013    Procedure: LAPAROSCOPIC CHOLECYSTECTOMY WITH INTRAOPERATIVE CHOLANGIOGRAM WITH LIVER BIOPSY;  Surgeon: Pedro Earls, MD;  Location: [facility];  Service: General;  Laterality: N/A;  . Colonoscopy N/A 02/11/2015    Dr.Rourk- multiple colonic polyps bx= tubular adenoma and an inflammatory polyp  . Cataract extraction w/phaco Left 02/14/2016    Procedure: CATARACT EXTRACTION PHACO AND INTRAOCULAR LENS PLACEMENT (IOC);  Surgeon: Rutherford Guys, MD;  Location: [facility];  Service: Ophthalmology;  Laterality: Left;  CDE:6.96  . Cataract extraction w/phaco Right 02/28/2016    Procedure: CATARACT EXTRACTION PHACO AND  INTRAOCULAR LENS PLACEMENT (IOC);  Surgeon: Rutherford Guys, MD;  Location: [facility];  Service: Ophthalmology;  Laterality: Right;  CDE:9.54    Prior to Admission medications   Medication Sig Start Date End Date Taking? Authorizing Provider  allopurinol (ZYLOPRIM) 100 MG tablet Take 100 mg by mouth daily with breakfast.    Yes Historical Provider, MD  aspirin 325 MG tablet Take 325 mg by mouth daily.   Yes Historical Provider, MD  atorvastatin (LIPITOR) 40 MG tablet Take 40 mg by mouth every evening.    Yes Historical Provider,  MD  betamethasone dipropionate (DIPROLENE) 0.05 % cream Apply 1 application topically daily as needed (siriasis).  11/22/15  Yes Historical Provider, MD  carvedilol (COREG) 12.5 MG tablet Take 12.5 mg by mouth 2 (two) times daily with a meal.   Yes Historical Provider, MD  escitalopram (LEXAPRO) 10 MG tablet Take 10 mg by mouth daily.   Yes Historical Provider, MD  Insulin Glargine (LANTUS SOLOSTAR) 100 UNIT/ML Solostar Pen Inject 15 Units into the skin at bedtime.   Yes Historical Provider, MD  Misc Natural Products (TART CHERRY ADVANCED PO) Take 1,200 mg by mouth daily.   Yes Historical Provider, MD  Multiple Vitamins-Iron (MULTIVITAMINS WITH IRON) TABS Take 1 tablet by mouth daily.   Yes Historical Provider, MD  Multiple Vitamins-Minerals (PRESERVISION AREDS PO) Take 1 capsule by mouth 2 (two) times daily.    Yes Historical Provider, MD  naproxen sodium (ANAPROX) 220 MG tablet Take 220 mg by mouth daily as needed (headache).   Yes Historical Provider, MD  quinapril (ACCUPRIL) 20 MG tablet Take 20 mg by mouth every morning.    Yes Historical Provider, MD  ranitidine (ZANTAC) 150 MG tablet Take 150 mg by mouth 2 (two) times daily.   Yes Historical Provider, MD  polyethylene glycol-electrolytes (TRILYTE) 420 G solution Take 4,000 mLs by mouth as directed. Patient not taking: Reported on 05/04/2016 01/27/15   Daneil Dolin, MD    Allergies as of 05/04/2016 - Review Complete 05/04/2016  Allergen Reaction Noted  . Amiodarone Nausea Only 07/23/2013    Family History  Problem Relation Age of Onset  . Anesthesia problems Neg Hx   . Hypotension Neg Hx   . Malignant hyperthermia Neg Hx   . Pseudochol deficiency Neg Hx   . Stroke Father   . Heart failure Brother   . Cancer Sister     pancreas & liver & liver  . Hypertension Sister   . Cancer Sister     breast  . Heart failure Sister   . Cancer Mother     leukemia  . Colon cancer Neg Hx     Social History   Social History  . Marital  Status: Married    Spouse Name: N/A  . Number of Children: N/A  . Years of Education: N/A   Occupational History  . Not on file.   Social History Main Topics  . Smoking status: Former Smoker -- 0.50 packs/day for 25 years    Types: Cigarettes    Quit date: 02/25/1983  . Smokeless tobacco: Never Used  . Alcohol Use: Yes     Comment: occasional  . Drug Use: No  . Sexual Activity: No   Other Topics Concern  . Not on file   Social History Narrative    Review of Systems: See HPI, otherwise negative ROS  Physical Exam: BP 111/59 mmHg  Pulse 59  Temp(Src) 98.2 F (36.8 C) (Oral)  Ht 6' (1.829 m)  Wt 181 lb 6.4 oz (82.283 kg)  BMI 24.60 kg/m2 General:   Alert,   pleasant and cooperative in NAD;  appears somewhat younger than stated chronological age. Accompanied by spouse. Skin:  Intact without significant lesions or rashes. Eyes:  Sclera clear, no icterus.   Conjunctiva pink. Ears:  Normal auditory acuity. Lungs:  Clear throughout to auscultation.   No wheezes, crackles, or rhonchi. No acute distress. Heart:  Regular rate and rhythm; no murmurs, clicks, rubs,  or gallops. Abdomen: Non-distended, normal bowel sounds.  Soft and nontender without appreciable mass or hepatosplenomegaly.  Pulses:  Normal pulses noted. Extremities:  Without clubbing or edema.  Impression:  Very pleasant 82 year old gentleman;  history of multiple colonic adenomas -  status post surveillance colonoscopy and removal of polyps seen one year ago.  Now presenting with some weight loss. Lifelong chronic GERD symptoms.  Weight-loss earlier in the year seems to have stabilized a bit. Some overlap with a bout of bronchitis. He has definitely increased his aerobic exercise with walking over the past month or so. His recent illness and increased activity may explain some of his weight loss.  Reports  a new diagnosis of mild chronic kidney disease. Mild normocytic anemia noted. Aside from chronic GERD, he is  devoid any lower GI tract symptoms. Weight loss at this time somewhat nonspecific.    Recommendations:  I have offered the patient diagnostic EGD in the near future to assess long-standing reflux symptoms and weight loss.The risks, benefits, limitations, alternatives and imponderables have been reviewed with the patient. Potential for esophageal dilation, biopsy, etc. have also been reviewed.  Questions have been answered. All parties agreeable.   Schedule diagnostic  EGD - GERD ; weight loss;  Conscious sedation  GERD information  Decrease lantus to 5 units the night before the procedure  TSH, CMP and CBC today  Further recommendations to follow    Notice: This dictation was prepared with Dragon dictation along with smaller phrase technology. Any transcriptional errors that result from this process are unintentional and may not be corrected upon review.

## 2016-05-05 LAB — COMPLETE METABOLIC PANEL WITH GFR
ALT: 15 U/L (ref 9–46)
AST: 19 U/L (ref 10–35)
Albumin: 3.7 g/dL (ref 3.6–5.1)
Alkaline Phosphatase: 96 U/L (ref 40–115)
BUN: 28 mg/dL — ABNORMAL HIGH (ref 7–25)
CALCIUM: 8.5 mg/dL — AB (ref 8.6–10.3)
CHLORIDE: 102 mmol/L (ref 98–110)
CO2: 26 mmol/L (ref 20–31)
CREATININE: 1.36 mg/dL — AB (ref 0.70–1.11)
GFR, EST AFRICAN AMERICAN: 56 mL/min — AB (ref >=60)
GFR, EST NON AFRICAN AMERICAN: 49 mL/min — AB (ref >=60)
Glucose, Bld: 185 mg/dL — ABNORMAL HIGH (ref 65–99)
Potassium: 4.8 mmol/L (ref 3.5–5.3)
Sodium: 138 mmol/L (ref 135–146)
Total Bilirubin: 0.7 mg/dL (ref 0.2–1.2)
Total Protein: 5.6 g/dL — ABNORMAL LOW (ref 6.1–8.1)

## 2016-05-05 LAB — TSH: TSH: 1.47 m[IU]/L (ref 0.40–4.50)

## 2016-05-08 ENCOUNTER — Other Ambulatory Visit: Payer: Self-pay

## 2016-05-08 DIAGNOSIS — K219 Gastro-esophageal reflux disease without esophagitis: Secondary | ICD-10-CM

## 2016-05-08 DIAGNOSIS — R634 Abnormal weight loss: Secondary | ICD-10-CM

## 2016-05-10 ENCOUNTER — Ambulatory Visit (HOSPITAL_COMMUNITY)
Admission: RE | Admit: 2016-05-10 | Discharge: 2016-05-10 | Disposition: A | Discharge status: Home / Self Care | Payer: Medicare Other | Source: Ambulatory Visit | Attending: Internal Medicine | Admitting: Internal Medicine

## 2016-05-10 ENCOUNTER — Encounter (HOSPITAL_COMMUNITY)
Admission: RE | Disposition: A | Discharge status: Home / Self Care | Payer: Self-pay | Source: Ambulatory Visit | Attending: Internal Medicine

## 2016-05-10 DIAGNOSIS — E1122 Type 2 diabetes mellitus with diabetic chronic kidney disease: Secondary | ICD-10-CM | POA: Insufficient documentation

## 2016-05-10 DIAGNOSIS — Z7982 Long term (current) use of aspirin: Secondary | ICD-10-CM | POA: Insufficient documentation

## 2016-05-10 DIAGNOSIS — Z87891 Personal history of nicotine dependence: Secondary | ICD-10-CM | POA: Diagnosis not present

## 2016-05-10 DIAGNOSIS — Z800 Family history of malignant neoplasm of digestive organs: Secondary | ICD-10-CM | POA: Diagnosis not present

## 2016-05-10 DIAGNOSIS — K222 Esophageal obstruction: Secondary | ICD-10-CM | POA: Diagnosis not present

## 2016-05-10 DIAGNOSIS — Z794 Long term (current) use of insulin: Secondary | ICD-10-CM | POA: Diagnosis not present

## 2016-05-10 DIAGNOSIS — N183 Chronic kidney disease, stage 3 (moderate): Secondary | ICD-10-CM | POA: Diagnosis not present

## 2016-05-10 DIAGNOSIS — Z951 Presence of aortocoronary bypass graft: Secondary | ICD-10-CM | POA: Diagnosis not present

## 2016-05-10 DIAGNOSIS — E785 Hyperlipidemia, unspecified: Secondary | ICD-10-CM | POA: Insufficient documentation

## 2016-05-10 DIAGNOSIS — B9681 Helicobacter pylori [H. pylori] as the cause of diseases classified elsewhere: Secondary | ICD-10-CM | POA: Diagnosis not present

## 2016-05-10 DIAGNOSIS — R634 Abnormal weight loss: Secondary | ICD-10-CM

## 2016-05-10 DIAGNOSIS — K2950 Unspecified chronic gastritis without bleeding: Secondary | ICD-10-CM | POA: Insufficient documentation

## 2016-05-10 DIAGNOSIS — I129 Hypertensive chronic kidney disease with stage 1 through stage 4 chronic kidney disease, or unspecified chronic kidney disease: Secondary | ICD-10-CM | POA: Insufficient documentation

## 2016-05-10 DIAGNOSIS — K449 Diaphragmatic hernia without obstruction or gangrene: Secondary | ICD-10-CM | POA: Diagnosis not present

## 2016-05-10 DIAGNOSIS — Z791 Long term (current) use of non-steroidal anti-inflammatories (NSAID): Secondary | ICD-10-CM | POA: Insufficient documentation

## 2016-05-10 DIAGNOSIS — K219 Gastro-esophageal reflux disease without esophagitis: Secondary | ICD-10-CM | POA: Diagnosis not present

## 2016-05-10 DIAGNOSIS — K3189 Other diseases of stomach and duodenum: Secondary | ICD-10-CM | POA: Diagnosis not present

## 2016-05-10 HISTORY — PX: ESOPHAGOGASTRODUODENOSCOPY: SHX5428

## 2016-05-10 LAB — GLUCOSE, CAPILLARY: Glucose-Capillary: 95 mg/dL (ref 65–99)

## 2016-05-10 SURGERY — EGD (ESOPHAGOGASTRODUODENOSCOPY)
Anesthesia: Moderate Sedation

## 2016-05-10 MED ORDER — LIDOCAINE VISCOUS 2 % MT SOLN
OROMUCOSAL | Status: AC
  Filled 2016-05-10: qty 15

## 2016-05-10 MED ORDER — MIDAZOLAM HCL 5 MG/5ML IJ SOLN
INTRAMUSCULAR | Status: DC | PRN
  Administered 2016-05-10: 2 mg via INTRAVENOUS

## 2016-05-10 MED ORDER — MIDAZOLAM HCL 5 MG/5ML IJ SOLN
INTRAMUSCULAR | Status: AC
  Filled 2016-05-10: qty 10

## 2016-05-10 MED ORDER — SODIUM CHLORIDE 0.9 % IV SOLN
INTRAVENOUS | Status: DC
  Administered 2016-05-10: 20 mL/h via INTRAVENOUS

## 2016-05-10 MED ORDER — ONDANSETRON HCL 4 MG/2ML IJ SOLN
INTRAMUSCULAR | Status: DC | PRN
  Administered 2016-05-10: 4 mg via INTRAVENOUS

## 2016-05-10 MED ORDER — ONDANSETRON HCL 4 MG/2ML IJ SOLN
INTRAMUSCULAR | Status: AC
  Filled 2016-05-10: qty 2

## 2016-05-10 MED ORDER — MEPERIDINE HCL 100 MG/ML IJ SOLN
INTRAMUSCULAR | Status: DC | PRN
  Administered 2016-05-10: 25 mg via INTRAVENOUS

## 2016-05-10 MED ORDER — SIMETHICONE 40 MG/0.6ML PO SUSP
ORAL | Status: DC | PRN
  Administered 2016-05-10: 14:00:00

## 2016-05-10 MED ORDER — MEPERIDINE HCL 100 MG/ML IJ SOLN
INTRAMUSCULAR | Status: AC
  Filled 2016-05-10: qty 2

## 2016-05-10 NOTE — H&P (View-Only) (Signed)
Primary Care Physician:  Grant [name]., MD Primary Gastroenterologist:  Dr. Gala [name] Pre-Procedure History & Physical: HPI:  Grant [name] is a 82 y.o. male here for evaluation of weight loss in a background of long-standing GERD. Grant [name] was last seen by [place] in February of last year. History multiple colon polyps. Last colonoscopy was in March 2016. Multiple small simple adenomas and inflammatory polyps removed. Exam felt to be adequate. No future colonoscopy recommended. Patient is noted to have some weight loss earlier this year; weight hovering around 198 pounds back in March of this year; on 03/28/2016, he weighed 181 pounds. He weighs 181.4 pounds today. Patient states he was down with bronchitis back in March. He rebounded from that and he and his wife he started walking about 25 minutes every day which -   over [place] mile on [place] country roads.  Patient's that have mild chronic kidney disease followed by nephrologist in [place].  More recently, he feels good;  hasn't had any fever or chills.; denies odynophagia or dysphagia. No early satiety, nausea or vomiting. He does have lifelong reflux symptoms for which he's taking various over-the-counter H2 blockers and antacids products;  never had an EGD. He denies melena or rectal bleeding. Bowel function reportedly normal. Because of weight loss, he was taken off metformin recently. He does continue taking Lantus insulin 15 units at bedtime. Hemoglobin A1c is 6.6 range.  The patient is unsure whether or not he has had  his thyroid checked recently. I cannot find any results in Epic at this time.  Past Medical History  Diagnosis Date  . Macular degeneration 09-09-13    bilateral"vision is poor"  . Diabetes mellitus   . Hypertension   . GERD (gastroesophageal reflux disease)   . Gout 09-09-13    last flare 8'14 with gallbladder attack at [facility]  . Arthritis   . Cancer ([place])     skin cancer of forehead  . Hyperlipidemia   . Erectile  dysfunction   . RBBB   . CAD (coronary artery disease)   . Pancreatitis 10--8-14    8'14 -enzymes improved  . Psoriasis 09-09-13    elbows, knees  . Tubular adenoma     Past Surgical History  Procedure Laterality Date  . Coronary artery bypass graft  02-28-2011    LIMA to LAD,SVG to acute marginal branch of RCA & sequential SVG to OM1 & OM2.  . Pilonidal cyst excision    . Colonoscopy  02/25/2012    Procedure: COLONOSCOPY;  Surgeon: Grant Dolin, MD;  [facility];  Service: Endoscopy;  Laterality: N/A;  7:30 AM polyps removed.  . US echocardiography  11/09/2008    mildly impaired diastolic dysfunction - Grade I  . Nm myocar perf wall motion  02/15/2011    High Risk  . Cardiac catheterization  02/19/2011    3 vessel CAD  . Cholecystectomy N/A 10/27/2013    Procedure: LAPAROSCOPIC CHOLECYSTECTOMY WITH INTRAOPERATIVE CHOLANGIOGRAM WITH LIVER BIOPSY;  Surgeon: Grant Earls, MD;  Location: [facility];  Service: General;  Laterality: N/A;  . Colonoscopy N/A 02/11/2015    Dr.Alayja [name]- multiple colonic polyps bx= tubular adenoma and an inflammatory polyp  . Cataract extraction w/phaco Left 02/14/2016    Procedure: CATARACT EXTRACTION PHACO AND INTRAOCULAR LENS PLACEMENT (IOC);  Surgeon: Grant Guys, MD;  Location: [facility];  Service: Ophthalmology;  Laterality: Left;  CDE:6.96  . Cataract extraction w/phaco Right 02/28/2016    Procedure: CATARACT EXTRACTION PHACO AND  INTRAOCULAR LENS PLACEMENT (IOC);  Surgeon: Grant Guys, MD;  Location: [facility];  Service: Ophthalmology;  Laterality: Right;  CDE:9.54    Prior to Admission medications   Medication Sig Start Date End Date Taking? Authorizing Provider  allopurinol (ZYLOPRIM) 100 MG tablet Take 100 mg by mouth daily with breakfast.    Yes Historical Provider, MD  aspirin 325 MG tablet Take 325 mg by mouth daily.   Yes Historical Provider, MD  atorvastatin (LIPITOR) 40 MG tablet Take 40 mg by mouth every evening.    Yes Historical Provider,  MD  betamethasone dipropionate (DIPROLENE) 0.05 % cream Apply 1 application topically daily as needed (siriasis).  11/22/15  Yes Historical Provider, MD  carvedilol (COREG) 12.5 MG tablet Take 12.5 mg by mouth 2 (two) times daily with a meal.   Yes Historical Provider, MD  escitalopram (LEXAPRO) 10 MG tablet Take 10 mg by mouth daily.   Yes Historical Provider, MD  Insulin Glargine (LANTUS SOLOSTAR) 100 UNIT/ML Solostar Pen Inject 15 Units into the skin at bedtime.   Yes Historical Provider, MD  Misc Natural Products (TART CHERRY ADVANCED PO) Take 1,200 mg by mouth daily.   Yes Historical Provider, MD  Multiple Vitamins-Iron (MULTIVITAMINS WITH IRON) TABS Take 1 tablet by mouth daily.   Yes Historical Provider, MD  Multiple Vitamins-Minerals (PRESERVISION AREDS PO) Take 1 capsule by mouth 2 (two) times daily.    Yes Historical Provider, MD  naproxen sodium (ANAPROX) 220 MG tablet Take 220 mg by mouth daily as needed (headache).   Yes Historical Provider, MD  quinapril (ACCUPRIL) 20 MG tablet Take 20 mg by mouth every morning.    Yes Historical Provider, MD  ranitidine (ZANTAC) 150 MG tablet Take 150 mg by mouth 2 (two) times daily.   Yes Historical Provider, MD  polyethylene glycol-electrolytes (TRILYTE) 420 G solution Take 4,000 mLs by mouth as directed. Patient not taking: Reported on 05/04/2016 01/27/15   Grant Dolin, MD    Allergies as of 05/04/2016 - Review Complete 05/04/2016  Allergen Reaction Noted  . Amiodarone Nausea Only 07/23/2013    Family History  Problem Relation Age of Onset  . Anesthesia problems Neg Hx   . Hypotension Neg Hx   . Malignant hyperthermia Neg Hx   . Pseudochol deficiency Neg Hx   . Stroke Father   . Heart failure Brother   . Cancer Sister     pancreas & liver & liver  . Hypertension Sister   . Cancer Sister     breast  . Heart failure Sister   . Cancer Mother     leukemia  . Colon cancer Neg Hx     Social History   Social History  . Marital  Status: Married    Spouse Name: N/A  . Number of Children: N/A  . Years of Education: N/A   Occupational History  . Not on file.   Social History Main Topics  . Smoking status: Former Smoker -- 0.50 packs/day for 25 years    Types: Cigarettes    Quit date: 02/25/1983  . Smokeless tobacco: Never Used  . Alcohol Use: Yes     Comment: occasional  . Drug Use: No  . Sexual Activity: No   Other Topics Concern  . Not on file   Social History Narrative    Review of Systems: See HPI, otherwise negative ROS  Physical Exam: BP 111/59 mmHg  Pulse 59  Temp(Src) 98.2 F (36.8 C) (Oral)  Ht 6' (1.829 m)  Wt 181 lb 6.4 oz (82.283 kg)  BMI 24.60 kg/m2 General:   Alert,   pleasant and cooperative in NAD;  appears somewhat younger than stated chronological age. Accompanied by spouse. Skin:  Intact without significant lesions or rashes. Eyes:  Sclera clear, no icterus.   Conjunctiva pink. Ears:  Normal auditory acuity. Lungs:  Clear throughout to auscultation.   No wheezes, crackles, or rhonchi. No acute distress. Heart:  Regular rate and rhythm; no murmurs, clicks, rubs,  or gallops. Abdomen: Non-distended, normal bowel sounds.  Soft and nontender without appreciable mass or hepatosplenomegaly.  Pulses:  Normal pulses noted. Extremities:  Without clubbing or edema.  Impression:  Very pleasant 82 year old gentleman;  history of multiple colonic adenomas -  status post surveillance colonoscopy and removal of polyps seen one year ago.  Now presenting with some weight loss. Lifelong chronic GERD symptoms.  Weight-loss earlier in the year seems to have stabilized a bit. Some overlap with a bout of bronchitis. He has definitely increased his aerobic exercise with walking over the past month or so. His recent illness and increased activity may explain some of his weight loss.  Reports  a new diagnosis of mild chronic kidney disease. Mild normocytic anemia noted. Aside from chronic GERD, he is  devoid any lower GI tract symptoms. Weight loss at this time somewhat nonspecific.    Recommendations:  I have offered the patient diagnostic EGD in the near future to assess long-standing reflux symptoms and weight loss.The risks, benefits, limitations, alternatives and imponderables have been reviewed with the patient. Potential for esophageal dilation, biopsy, etc. have also been reviewed.  Questions have been answered. All parties agreeable.   Schedule diagnostic  EGD - GERD ; weight loss;  Conscious sedation  GERD information  Decrease lantus to 5 units the night before the procedure  TSH, CMP and CBC today  Further recommendations to follow    Notice: This dictation was prepared with Dragon dictation along with smaller phrase technology. Any transcriptional errors that result from this process are unintentional and may not be corrected upon review.

## 2016-05-10 NOTE — Interval H&P Note (Signed)
History and Physical Interval Note:  05/10/2016 1:52 PM  Grant [name]  has presented today for surgery, with the diagnosis of GERD, weight loss  The various methods of treatment have been discussed with the patient and family. After consideration of risks, benefits and other options for treatment, the patient has consented to  Procedure(s) with comments: ESOPHAGOGASTRODUODENOSCOPY (EGD) (N/A) - 1300 as a surgical intervention .  The patient's history has been reviewed, patient examined, no change in status, stable for surgery.  I have reviewed the patient's chart and labs.  Questions were answered to the patient's satisfaction.     Grant [name]  No change. No dysphagia. Diagnostic EGD per plan. The risks, benefits, limitations, alternatives and imponderables have been reviewed with the patient. Potential for esophageal dilation, biopsy, etc. have also been reviewed.  Questions have been answered. All parties agreeable.

## 2016-05-10 NOTE — Op Note (Signed)
[facility] Patient Name: Grant [name] Procedure Date: 05/10/2016 1:39 PM MRN: [id number] Date of Birth: Jul 22, 1934 Attending MD: Norvel Richards , MD CSN: [id number] Age: 82 Admit Type: Outpatient Procedure:                Upper GI endoscopy with gastric biopsy Indications:              Weight loss; GERD Providers:                Norvel Richards, MD, Gwenlyn Fudge, RN, Randa Spike, Technician Referring MD:              Medicines:                Midazolam 2 mg IV, Meperidine 25 mg IV, Ondansetron                            4 mg IV Complications:            No immediate complications. Estimated Blood Loss:     Estimated blood loss was minimal. Procedure:                Pre-Anesthesia Assessment:                           - Prior to the procedure, a History and Physical                            was performed, and patient medications and                            allergies were reviewed. The patient's tolerance of                            previous anesthesia was also reviewed. The risks                            and benefits of the procedure and the sedation                            options and risks were discussed with the patient.                            All questions were answered, and informed consent                            was obtained. Prior Anticoagulants: The patient has                            taken no previous anticoagulant or antiplatelet                            agents. ASA Grade Assessment: II - A patient with  mild systemic disease. After reviewing the risks                            and benefits, the patient was deemed in                            satisfactory condition to undergo the procedure.                           - Prior to the procedure, a History and Physical                            was performed, and patient medications and                            allergies were  reviewed. The patient's tolerance of                            previous anesthesia was also reviewed. The risks                            and benefits of the procedure and the sedation                            options and risks were discussed with the patient.                            All questions were answered, and informed consent                            was obtained. Prior Anticoagulants: The patient has                            taken no previous anticoagulant or antiplatelet                            agents. ASA Grade Assessment: II - A patient with                            mild systemic disease. After reviewing the risks                            and benefits, the patient was deemed in                            satisfactory condition to undergo the procedure.                           After obtaining informed consent, the endoscope was                            passed under direct vision. Throughout the  procedure, the patient's blood pressure, pulse, and                            oxygen saturations were monitored continuously. The                            [id number]) was introduced through the mouth,                            and advanced to the second part of duodenum. The                            upper GI endoscopy was accomplished without                            difficulty. The patient tolerated the procedure                            well. The upper GI endoscopy was accomplished                            without difficulty. The patient tolerated the                            procedure well. Scope In: 2:01:55 PM Scope Out: 2:05:58 PM Total Procedure Duration: 0 hours 4 minutes 3 seconds  Findings:      The examined esophagus was normal.      A mild Schatzki ring (acquired) was found at the gastroesophageal       junction.      A medium-sized hiatal hernia was present.      Diffuse moderately erythematous and eroded  mucosa was found in the       stomach. This was biopsied with a cold forceps for histology. Estimated       blood loss was minimal.      The second portion of the duodenum was normal. Impression:               - Normal esophagus.                           - Mild Schatzki ring.                           - Medium-sized hiatal hernia.                           - Erythematous /eroded gastric mucosa. Biopsied.                           - Normal second portion of the duodenum. Moderate Sedation:      Moderate (conscious) sedation was administered by the endoscopy nurse       and supervised by the endoscopist. The following parameters were       monitored: oxygen saturation, heart rate, blood pressure, respiratory       rate, EKG, adequacy of pulmonary ventilation, and response to care.       Total  physician intraservice time was 9 minutes. Recommendation:           - Patient has a contact number available for                            emergencies. The signs and symptoms of potential                            delayed complications were discussed with the                            patient. Return to normal activities tomorrow.                            Written discharge instructions were provided to the                            patient.                           - Advance diet as tolerated.                           - Continue present medications.                           - Await pathology results.                           - Return to GI office in 4 weeks. Procedure Code(s):        --- Professional ---                           [phone], Esophagogastroduodenoscopy, flexible,                            transoral; with biopsy, single or multiple Diagnosis Code(s):        --- Professional ---                           K22.2, Esophageal obstruction                           K44.9, Diaphragmatic hernia without obstruction or                            gangrene                            K31.89, Other diseases of stomach and duodenum                           R63.4, Abnormal weight loss CPT copyright 2016 American Medical Association. All rights reserved. The codes documented in this report are preliminary and upon coder review may  be revised to meet current compliance requirements. Cristopher Estimable. Gabriel Conry, MD Norvel Richards, MD 05/10/2016 2:22:03 PM This report has been signed electronically. Number of  Addenda: 0

## 2016-05-10 NOTE — Discharge Instructions (Signed)
EGD Discharge instructions Please read the instructions outlined below and refer to this sheet in the next few weeks. These discharge instructions provide you with general information on caring for yourself after you leave the hospital. Your doctor may also give you specific instructions. While your treatment has been planned according to the most current medical practices available, unavoidable complications occasionally occur. If you have any problems or questions after discharge, please call your doctor. ACTIVITY  You may resume your regular activity but move at a slower pace for the next 24 hours.   Take frequent rest periods for the next 24 hours.   Walking will help expel (get rid of) the air and reduce the bloated feeling in your abdomen.   No driving for 24 hours (because of the anesthesia (medicine) used during the test).   You may shower.   Do not sign any important legal documents or operate any machinery for 24 hours (because of the anesthesia used during the test).  NUTRITION  Drink plenty of fluids.   You may resume your normal diet.   Begin with a light meal and progress to your normal diet.   Avoid alcoholic beverages for 24 hours or as instructed by your caregiver.  MEDICATIONS  You may resume your normal medications unless your caregiver tells you otherwise.  WHAT YOU CAN EXPECT TODAY  You may experience abdominal discomfort such as a feeling of fullness or gas pains.  FOLLOW-UP  Your doctor will discuss the results of your test with you.  SEEK IMMEDIATE MEDICAL ATTENTION IF ANY OF THE FOLLOWING OCCUR:  Excessive nausea (feeling sick to your stomach) and/or vomiting.   Severe abdominal pain and distention (swelling).   Trouble swallowing.   Temperature over 101 F (37.8 C).   Rectal bleeding or vomiting of blood.    Further recommendations to follow pending review of pathology report  GERD information provided  Gastroesophageal Reflux Disease,  Adult Normally, food travels down the esophagus and stays in the stomach to be digested. However, when a person has gastroesophageal reflux disease (GERD), food and stomach acid move back up into the esophagus. When this happens, the esophagus becomes sore and inflamed. Over time, GERD can create small holes (ulcers) in the lining of the esophagus.  CAUSES This condition is caused by a problem with the muscle between the esophagus and the stomach (lower esophageal sphincter, or LES). Normally, the LES muscle closes after food passes through the esophagus to the stomach. When the LES is weakened or abnormal, it does not close properly, and that allows food and stomach acid to go back up into the esophagus. The LES can be weakened by certain dietary substances, medicines, and medical conditions, including:  Tobacco use.  Pregnancy.  Having a hiatal hernia.  Heavy alcohol use.  Certain foods and beverages, such as coffee, chocolate, onions, and peppermint. RISK FACTORS This condition is more likely to develop in:  People who have an increased body weight.  People who have connective tissue disorders.  People who use NSAID medicines. SYMPTOMS Symptoms of this condition include:  Heartburn.  Difficult or painful swallowing.  The feeling of having a lump in the throat.  Abitter taste in the mouth.  Bad breath.  Having a large amount of saliva.  Having an upset or bloated stomach.  Belching.  Chest pain.  Shortness of breath or wheezing.  Ongoing (chronic) cough or a night-time cough.  Wearing away of tooth enamel.  Weight loss. Different conditions can cause  chest pain. Make sure to see your health care provider if you experience chest pain. DIAGNOSIS Your health care provider will take a medical history and perform a physical exam. To determine if you have mild or severe GERD, your health care provider may also monitor how you respond to treatment. You may also have  other tests, including:  An endoscopy toexamine your stomach and esophagus with a small camera.  A test thatmeasures the acidity level in your esophagus.  A test thatmeasures how much pressure is on your esophagus.  A barium swallow or modified barium swallow to show the shape, size, and functioning of your esophagus. TREATMENT The goal of treatment is to help relieve your symptoms and to prevent complications. Treatment for this condition may vary depending on how severe your symptoms are. Your health care provider may recommend:  Changes to your diet.  Medicine.  Surgery. HOME CARE INSTRUCTIONS Diet  Follow a diet as recommended by your health care provider. This may involve avoiding foods and drinks such as:  Coffee and tea (with or without caffeine).  Drinks that containalcohol.  Energy drinks and sports drinks.  Carbonated drinks or sodas.  Chocolate and cocoa.  Peppermint and mint flavorings.  Garlic and onions.  Horseradish.  Spicy and acidic foods, including peppers, chili powder, curry powder, vinegar, hot sauces, and barbecue sauce.  Citrus fruit juices and citrus fruits, such as oranges, lemons, and limes.  Tomato-based foods, such as red sauce, chili, salsa, and pizza with red sauce.  Fried and fatty foods, such as donuts, french fries, potato chips, and high-fat dressings.  High-fat meats, such as hot dogs and fatty cuts of red and white meats, such as rib eye steak, sausage, ham, and bacon.  High-fat dairy items, such as whole milk, butter, and cream cheese.  Eat small, frequent meals instead of large meals.  Avoid drinking large amounts of liquid with your meals.  Avoid eating meals during the 2-3 hours before bedtime.  Avoid lying down right after you eat.  Do not exercise right after you eat. General Instructions  Pay attention to any changes in your symptoms.  Take over-the-counter and prescription medicines only as told by your  health care provider. Do not take aspirin, ibuprofen, or other NSAIDs unless your health care provider told you to do so.  Do not use any tobacco products, including cigarettes, chewing tobacco, and e-cigarettes. If you need help quitting, ask your health care provider.  Wear loose-fitting clothing. Do not wear anything tight around your waist that causes pressure on your abdomen.  Raise (elevate) the head of your bed 6 inches (15cm).  Try to reduce your stress, such as with yoga or meditation. If you need help reducing stress, ask your health care provider.  If you are overweight, reduce your weight to an amount that is healthy for you. Ask your health care provider for guidance about a safe weight loss goal.  Keep all follow-up visits as told by your health care provider. This is important. SEEK MEDICAL CARE IF:  You have new symptoms.  You have unexplained weight loss.  You have difficulty swallowing, or it hurts to swallow.  You have wheezing or a persistent cough.  Your symptoms do not improve with treatment.  You have a hoarse voice. SEEK IMMEDIATE MEDICAL CARE IF:  You have pain in your arms, neck, jaw, teeth, or back.  You feel sweaty, dizzy, or light-headed.  You have chest pain or shortness of breath.  You  vomit and your vomit looks like blood or coffee grounds.  You faint.  Your stool is bloody or black.  You cannot swallow, drink, or eat.   This information is not intended to replace advice given to you by your health care provider. Make sure you discuss any questions you have with your health care provider.   Document Released: 08/29/2005 Document Revised: 08/10/2015 Document Reviewed: 03/16/2015 Elsevier Interactive Patient Education [employer].

## 2016-05-14 DIAGNOSIS — L400 Psoriasis vulgaris: Secondary | ICD-10-CM | POA: Diagnosis not present

## 2016-05-14 DIAGNOSIS — L570 Actinic keratosis: Secondary | ICD-10-CM | POA: Diagnosis not present

## 2016-05-14 DIAGNOSIS — Z85828 Personal history of other malignant neoplasm of skin: Secondary | ICD-10-CM | POA: Diagnosis not present

## 2016-05-15 ENCOUNTER — Telehealth: Payer: Self-pay

## 2016-05-15 ENCOUNTER — Encounter: Payer: Self-pay | Admitting: Internal Medicine

## 2016-05-15 ENCOUNTER — Encounter (HOSPITAL_COMMUNITY): Payer: Self-pay | Admitting: Internal Medicine

## 2016-05-15 NOTE — Telephone Encounter (Signed)
Per RMR-  Send letter to patient.  Send copy of letter with path to referring provider and PCP.  Patient needs PrevPak or generic equivalent x 14 days--hold any acid suppression and/or statin therapy patient may be taking during treatment.Then resume.   Patient should have a followup appointment with [place] in 4-6 weeks to check weight etc.

## 2016-05-15 NOTE — Telephone Encounter (Signed)
Letter mailed to the pt. 

## 2016-05-15 NOTE — Telephone Encounter (Signed)
OV made and letter mailed °

## 2016-05-17 DIAGNOSIS — N2581 Secondary hyperparathyroidism of renal origin: Secondary | ICD-10-CM | POA: Diagnosis not present

## 2016-05-17 DIAGNOSIS — E1122 Type 2 diabetes mellitus with diabetic chronic kidney disease: Secondary | ICD-10-CM | POA: Diagnosis not present

## 2016-05-17 DIAGNOSIS — N183 Chronic kidney disease, stage 3 (moderate): Secondary | ICD-10-CM | POA: Diagnosis not present

## 2016-05-17 DIAGNOSIS — I129 Hypertensive chronic kidney disease with stage 1 through stage 4 chronic kidney disease, or unspecified chronic kidney disease: Secondary | ICD-10-CM | POA: Diagnosis not present

## 2016-05-22 NOTE — Telephone Encounter (Signed)
Tried to call pt- NA- LMOM 

## 2016-05-24 ENCOUNTER — Other Ambulatory Visit: Payer: Self-pay | Admitting: Internal Medicine

## 2016-05-24 MED ORDER — AMOXICILLIN 500 MG PO TABS: 1000.0000 mg | ORAL_TABLET | Freq: Two times a day (BID) | ORAL | Status: DC

## 2016-05-24 MED ORDER — LANSOPRAZOLE 30 MG PO CPDR: 30.0000 mg | DELAYED_RELEASE_CAPSULE | Freq: Two times a day (BID) | ORAL | Status: DC

## 2016-05-24 MED ORDER — AMOXICILL-CLARITHRO-LANSOPRAZ PO MISC: Freq: Two times a day (BID) | ORAL | Status: DC

## 2016-05-24 MED ORDER — CLARITHROMYCIN 500 MG PO TABS: 500.0000 mg | ORAL_TABLET | Freq: Two times a day (BID) | ORAL | Status: DC

## 2016-05-24 NOTE — Telephone Encounter (Signed)
Please check on this one. Patient was on zantac bid prior to his EGD. The lansoprazole bid was given as part of H.pylori treatment.   If patient is having heartburn type symptoms, we can give lansoprazole in place of zantac but if he is doing well on zantac he can just stay on that. Let me know.

## 2016-05-24 NOTE — Telephone Encounter (Signed)
Pt is aware. rx has been sent in. He is aware to stop reflux and cholesterol medications. He will call me if he has any problems.

## 2016-05-24 NOTE — Telephone Encounter (Signed)
Im trying to do a PA for this for his hpylori treatment. That is the only reason he is on this.

## 2016-05-24 NOTE — Telephone Encounter (Signed)
Pt called back, prevpac is not covered by his insurance. The cost is $700. I have sent in generic rx's. I tried to call pt back to let him know, NA-LMOM

## 2016-05-28 ENCOUNTER — Other Ambulatory Visit: Payer: Self-pay | Admitting: Internal Medicine

## 2016-05-29 ENCOUNTER — Other Ambulatory Visit: Payer: Self-pay | Admitting: "Endocrinology

## 2016-06-11 ENCOUNTER — Other Ambulatory Visit: Payer: Self-pay | Admitting: Gastroenterology

## 2016-06-11 NOTE — Telephone Encounter (Signed)
rx for protonix was sent in for his hpylori treatment on 05/29/16. Lansoprazole was denied by his insurance.

## 2016-06-20 ENCOUNTER — Ambulatory Visit (INDEPENDENT_AMBULATORY_CARE_PROVIDER_SITE_OTHER): Payer: Medicare Other | Admitting: Gastroenterology

## 2016-06-20 ENCOUNTER — Other Ambulatory Visit: Payer: Self-pay | Admitting: "Endocrinology

## 2016-06-20 ENCOUNTER — Encounter: Payer: Self-pay | Admitting: Gastroenterology

## 2016-06-20 VITALS — BP 138/64 | HR 54 | Temp 97.3°F | Ht 72.0 in | Wt 187.4 lb

## 2016-06-20 DIAGNOSIS — R634 Abnormal weight loss: Secondary | ICD-10-CM

## 2016-06-20 DIAGNOSIS — E1159 Type 2 diabetes mellitus with other circulatory complications: Secondary | ICD-10-CM | POA: Diagnosis not present

## 2016-06-20 DIAGNOSIS — B9681 Helicobacter pylori [H. pylori] as the cause of diseases classified elsewhere: Secondary | ICD-10-CM | POA: Diagnosis not present

## 2016-06-20 DIAGNOSIS — K2970 Gastritis, unspecified, without bleeding: Secondary | ICD-10-CM

## 2016-06-20 LAB — COMPLETE METABOLIC PANEL WITH GFR
ALT: 16 U/L (ref 9–46)
AST: 19 U/L (ref 10–35)
Albumin: 3.8 g/dL (ref 3.6–5.1)
Alkaline Phosphatase: 84 U/L (ref 40–115)
BUN: 20 mg/dL (ref 7–25)
CHLORIDE: 107 mmol/L (ref 98–110)
CO2: 25 mmol/L (ref 20–31)
CREATININE: 1.15 mg/dL — AB (ref 0.70–1.11)
Calcium: 8.7 mg/dL (ref 8.6–10.3)
GFR, Est African American: 69 mL/min (ref >=60)
GFR, Est Non African American: 60 mL/min (ref >=60)
GLUCOSE: 96 mg/dL (ref 65–99)
Potassium: 4.7 mmol/L (ref 3.5–5.3)
Sodium: 141 mmol/L (ref 135–146)
Total Bilirubin: 0.7 mg/dL (ref 0.2–1.2)
Total Protein: 6 g/dL — ABNORMAL LOW (ref 6.1–8.1)

## 2016-06-20 LAB — HEMOGLOBIN A1C
Hgb A1c MFr Bld: 7.2 % — ABNORMAL HIGH (ref <5.7)
MEAN PLASMA GLUCOSE: 160 mg/dL

## 2016-06-20 NOTE — Assessment & Plan Note (Signed)
H. pylori gastritis, status post equivalent of Prevpac treatment. Patient has gained 6 pounds. Feels well. He will continue to monitor his weight weekly. If he notices decline in weight he will let us know. In September we will check for H. pylori eradication with H. pylori stool antigen test. He will be off of PPI for 2 weeks prior testing and no antibiotics during that time as well. Return to the office in 4 months to see Dr. Gala Romney.

## 2016-06-20 NOTE — Progress Notes (Signed)
cc'ed to pcp °

## 2016-06-20 NOTE — Patient Instructions (Signed)
1. We will check stool specimen in 2 months from now to make sure we completely treated the H. Pylori bacteria that was in your stomach. You will need to bee off of pantoprazole and other acid reducer medications and NO antibiotics for 2 weeks prior to stool collection. 2. Return to the office in four months for follow up.  3. Call if further weight loss noted.

## 2016-06-20 NOTE — Progress Notes (Signed)
Primary Care Physician:  Glo Herring., MD  Primary Gastroenterologist:  Garfield Cornea, MD   Chief Complaint  Patient presents with  . Follow-up    weight check. doing well    HPI:  Grant [name] is a 82 y.o. male here for follow-up. Recently underwent EGD for long-standing GERD, weight loss. He had a mild Schatzki ring, medium-sized hiatal hernia, H. pylori gastritis. Was treated with the equivalent of Prevpac, completed course. His weight is up 6 pounds since June.He feels well. Appetite has always been good. Denies heartburn or abdominal pain. No dysphagia. Bowel movements are regular. No blood in stool or melena. Both patient and his wife are pleased with his progress.  Current Outpatient Prescriptions  Medication Sig Dispense Refill  . allopurinol (ZYLOPRIM) 100 MG tablet Take 100 mg by mouth daily with breakfast.     . aspirin 325 MG tablet Take 325 mg by mouth daily.    Marland Kitchen atorvastatin (LIPITOR) 40 MG tablet Take 40 mg by mouth every evening.     . betamethasone dipropionate (DIPROLENE) 0.05 % cream Apply 1 application topically daily as needed (siriasis).   12  . carvedilol (COREG) 12.5 MG tablet Take 12.5 mg by mouth 2 (two) times daily with a meal.    . escitalopram (LEXAPRO) 10 MG tablet Take 10 mg by mouth daily.    Marland Kitchen LANTUS SOLOSTAR 100 UNIT/ML Solostar Pen INJECT 20 UNITS SUBCUTANEOUS AT BEDTIME (Patient taking differently: INJECT 15 UNITS SUBCUTANEOUS AT BEDTIME) 15 mL 2  . Misc Natural Products (TART CHERRY ADVANCED PO) Take 1,200 mg by mouth daily.    . Multiple Vitamins-Minerals (PRESERVISION AREDS PO) Take 1 capsule by mouth 2 (two) times daily.     . pantoprazole (PROTONIX) 40 MG tablet TAKE 1 TABLET BY MOUTH TWICE DAILY 180 tablet 2  . quinapril (ACCUPRIL) 20 MG tablet Take 20 mg by mouth every morning.     . ranitidine (ZANTAC) 150 MG tablet Take 150 mg by mouth 2 (two) times daily.     No current facility-administered medications for this visit.     Allergies as of 06/20/2016 - Review Complete 06/20/2016  Allergen Reaction Noted  . Amiodarone Nausea Only 07/23/2013    Past Medical History  Diagnosis Date  . Macular degeneration 09-09-13    bilateral"vision is poor"  . Diabetes mellitus   . Hypertension   . GERD (gastroesophageal reflux disease)   . Gout 09-09-13    last flare 8'14 with gallbladder attack at [facility]  . Arthritis   . Cancer ([place])     skin cancer of forehead  . Hyperlipidemia   . Erectile dysfunction   . RBBB   . CAD (coronary artery disease)   . Pancreatitis 10--8-14    8'14 -enzymes improved  . Psoriasis 09-09-13    elbows, knees  . Tubular adenoma     Past Surgical History  Procedure Laterality Date  . Coronary artery bypass graft  02-28-2011    LIMA to LAD,SVG to acute marginal branch of RCA & sequential SVG to OM1 & OM2.  . Pilonidal cyst excision    . Colonoscopy  02/25/2012    Procedure: COLONOSCOPY;  Surgeon: Daneil Dolin, MD;  Location: [facility] SUITE;  Service: Endoscopy;  Laterality: N/A;  7:30 AM polyps removed.  . US echocardiography  11/09/2008    mildly impaired diastolic dysfunction - Grade I  . Nm myocar perf wall motion  02/15/2011    High Risk  . Cardiac catheterization  02/19/2011  3 vessel CAD  . Cholecystectomy N/A 10/27/2013    Procedure: LAPAROSCOPIC CHOLECYSTECTOMY WITH INTRAOPERATIVE CHOLANGIOGRAM WITH LIVER BIOPSY;  Surgeon: Pedro Earls, MD;  Location: [facility] OR[facility];  Service: General;  Laterality: N/A;  . Colonoscopy N/A 02/11/2015    Dr.Rourk- multiple colonic polyps bx= tubular adenoma and an inflammatory polyp  . Cataract extraction w/phaco Left 02/14/2016    Procedure: CATARACT EXTRACTION PHACO AND INTRAOCULAR LENS PLACEMENT (IOC);  Surgeon: Rutherford Guys, MD;  Location: [facility] ORS;  Service: Ophthalmology;  Laterality: Left;  CDE:6.96  . Cataract extraction w/phaco Right 02/28/2016    Procedure: CATARACT EXTRACTION PHACO AND INTRAOCULAR LENS PLACEMENT (IOC);  Surgeon: Rutherford Guys, MD;  Location: [facility] ORS;  Service: Ophthalmology;  Laterality: Right;  CDE:9.54  . Esophagogastroduodenoscopy N/A 05/10/2016    RMR: mild schatzki ring, medium sized hh, H.pylori gastritis    Family History  Problem Relation Age of Onset  . Anesthesia problems Neg Hx   . Hypotension Neg Hx   . Malignant hyperthermia Neg Hx   . Pseudochol deficiency Neg Hx   . Stroke Father   . Heart failure Brother   . Cancer Sister     pancreas & liver & liver  . Hypertension Sister   . Cancer Sister     breast  . Heart failure Sister   . Cancer Mother     leukemia  . Colon cancer Neg Hx     Social History   Social History  . Marital Status: Married    Spouse Name: N/A  . Number of Children: N/A  . Years of Education: N/A   Occupational History  . Not on file.   Social History Main Topics  . Smoking status: Former Smoker -- 0.50 packs/day for 25 years    Types: Cigarettes    Quit date: 02/25/1983  . Smokeless tobacco: Never Used  . Alcohol Use: Yes     Comment: occasional  . Drug Use: No  . Sexual Activity: No   Other Topics Concern  . Not on file   Social History Narrative      ROS:  General: Negative for anorexia, weight loss, fever, chills, fatigue, weakness. Eyes: Negative for vision changes.  ENT: Negative for hoarseness, difficulty swallowing , nasal congestion. CV: Negative for chest pain, angina, palpitations, dyspnea on exertion, peripheral edema.  Respiratory: Negative for dyspnea at rest, dyspnea on exertion, cough, sputum, wheezing.  GI: See history of present illness. GU:  Negative for dysuria, hematuria, urinary incontinence, urinary frequency, nocturnal urination.  MS: Negative for joint pain, low back pain.  Derm: Negative for rash or itching.  Neuro: Negative for weakness, abnormal sensation, seizure, frequent headaches, memory loss, confusion.  Psych: Negative for anxiety, depression, suicidal ideation, hallucinations.  Endo: Negative for  unusual weight change.  Heme: Negative for bruising or bleeding. Allergy: Negative for rash or hives.    Physical Examination:  BP 138/64 mmHg  Pulse 54  Temp(Src) 97.3 F (36.3 C) (Oral)  Ht 6' (1.829 m)  Wt 187 lb 6.4 oz (85.004 kg)  BMI 25.41 kg/m2   General: Well-nourished, well-developed in no acute distress.  Head: Normocephalic, atraumatic.   Eyes: Conjunctiva pink, no icterus.  Abdomen: Bowel sounds are normal, nontender, nondistended, no hepatosplenomegaly or masses, no abdominal bruits or    hernia , no rebound or guarding.   Rectal: not performed Extremities: No lower extremity edema. No clubbing or deformities.  Neuro: Alert and oriented x 4 , grossly normal neurologically.  Skin: Warm and  dry, no rash or jaundice.   Psych: Alert and cooperative, normal mood and affect.

## 2016-06-25 DIAGNOSIS — Z1389 Encounter for screening for other disorder: Secondary | ICD-10-CM | POA: Diagnosis not present

## 2016-06-25 DIAGNOSIS — Z0000 Encounter for general adult medical examination without abnormal findings: Secondary | ICD-10-CM | POA: Diagnosis not present

## 2016-06-25 DIAGNOSIS — Z6827 Body mass index (BMI) 27.0-27.9, adult: Secondary | ICD-10-CM | POA: Diagnosis not present

## 2016-06-26 DIAGNOSIS — Z961 Presence of intraocular lens: Secondary | ICD-10-CM | POA: Diagnosis not present

## 2016-06-26 DIAGNOSIS — E119 Type 2 diabetes mellitus without complications: Secondary | ICD-10-CM | POA: Diagnosis not present

## 2016-06-28 ENCOUNTER — Encounter: Payer: Self-pay | Admitting: "Endocrinology

## 2016-06-28 ENCOUNTER — Ambulatory Visit (INDEPENDENT_AMBULATORY_CARE_PROVIDER_SITE_OTHER): Payer: Medicare Other | Admitting: "Endocrinology

## 2016-06-28 VITALS — BP 118/66 | HR 61 | Ht 72.0 in | Wt 186.0 lb

## 2016-06-28 DIAGNOSIS — E1159 Type 2 diabetes mellitus with other circulatory complications: Secondary | ICD-10-CM

## 2016-06-28 DIAGNOSIS — E785 Hyperlipidemia, unspecified: Secondary | ICD-10-CM | POA: Diagnosis not present

## 2016-06-28 DIAGNOSIS — I10 Essential (primary) hypertension: Secondary | ICD-10-CM

## 2016-06-28 NOTE — Progress Notes (Signed)
Subjective:    Patient ID: Grant [name], male    DOB: 1934/11/23,    Past Medical History:  Diagnosis Date  . Arthritis   . CAD (coronary artery disease)   . Cancer ([place])    skin cancer of forehead  . Diabetes mellitus   . Erectile dysfunction   . GERD (gastroesophageal reflux disease)   . Gout 09-09-13   last flare 8'14 with gallbladder attack at [facility]  . Hyperlipidemia   . Hypertension   . Macular degeneration 09-09-13   bilateral"vision is poor"  . Pancreatitis 10--8-14   8'14 -enzymes improved  . Psoriasis 09-09-13   elbows, knees  . RBBB   . Tubular adenoma    Past Surgical History:  Procedure Laterality Date  . CARDIAC CATHETERIZATION  02/19/2011   3 vessel CAD  . CATARACT EXTRACTION W/PHACO Left 02/14/2016   Procedure: CATARACT EXTRACTION PHACO AND INTRAOCULAR LENS PLACEMENT (IOC);  Surgeon: Rutherford Guys, MD;  Location: [facility];  Service: Ophthalmology;  Laterality: Left;  CDE:6.96  . CATARACT EXTRACTION W/PHACO Right 02/28/2016   Procedure: CATARACT EXTRACTION PHACO AND INTRAOCULAR LENS PLACEMENT (IOC);  Surgeon: Rutherford Guys, MD;  Location: [facility] ORS;  Service: Ophthalmology;  Laterality: Right;  CDE:9.54  . CHOLECYSTECTOMY N/A 10/27/2013   Procedure: LAPAROSCOPIC CHOLECYSTECTOMY WITH INTRAOPERATIVE CHOLANGIOGRAM WITH LIVER BIOPSY;  Surgeon: Pedro Earls, MD;  Location: [facility];  Service: General;  Laterality: N/A;  . COLONOSCOPY  02/25/2012   Procedure: COLONOSCOPY;  Surgeon: Daneil Dolin, MD;  Location: [facility];  Service: Endoscopy;  Laterality: N/A;  7:30 AM polyps removed.  . COLONOSCOPY N/A 02/11/2015   Dr.Rourk- multiple colonic polyps bx= tubular adenoma and an inflammatory polyp  . CORONARY ARTERY BYPASS GRAFT  02-28-2011   LIMA to LAD,SVG to acute marginal branch of RCA & sequential SVG to OM1 & OM2.  . ESOPHAGOGASTRODUODENOSCOPY N/A 05/10/2016   RMR: mild schatzki ring, medium sized hh, H.pylori gastritis  . NM MYOCAR PERF WALL MOTION  02/15/2011    High Risk  . PILONIDAL CYST EXCISION    . US ECHOCARDIOGRAPHY  11/09/2008   mildly impaired diastolic dysfunction - Grade I   Social History   Social History  . Marital status: Married    Spouse name: N/A  . Number of children: N/A  . Years of education: N/A   Social History Main Topics  . Smoking status: Former Smoker    Packs/day: 0.50    Years: 25.00    Types: Cigarettes    Quit date: 02/25/1983  . Smokeless tobacco: Never Used  . Alcohol use Yes     Comment: occasional  . Drug use: No  . Sexual activity: No   Other Topics Concern  . None   Social History Narrative  . None   Outpatient Encounter Prescriptions as of 06/28/2016  Medication Sig  . allopurinol (ZYLOPRIM) 100 MG tablet Take 100 mg by mouth daily with breakfast.   . aspirin 325 MG tablet Take 325 mg by mouth daily.  Marland Kitchen atorvastatin (LIPITOR) 40 MG tablet Take 40 mg by mouth every evening.   . betamethasone dipropionate (DIPROLENE) 0.05 % cream Apply 1 application topically daily as needed (siriasis).   . carvedilol (COREG) 12.5 MG tablet Take 12.5 mg by mouth 2 (two) times daily with a meal.  . escitalopram (LEXAPRO) 10 MG tablet Take 10 mg by mouth daily.  Marland Kitchen LANTUS SOLOSTAR 100 UNIT/ML Solostar Pen INJECT 20 UNITS SUBCUTANEOUS AT BEDTIME (Patient taking differently: INJECT  15 UNITS SUBCUTANEOUS AT BEDTIME)  . Misc Natural Products (TART CHERRY ADVANCED PO) Take 1,200 mg by mouth daily.  . Multiple Vitamins-Minerals (PRESERVISION AREDS PO) Take 1 capsule by mouth 2 (two) times daily.   . pantoprazole (PROTONIX) 40 MG tablet TAKE 1 TABLET BY MOUTH TWICE DAILY  . quinapril (ACCUPRIL) 20 MG tablet Take 20 mg by mouth every morning.   . ranitidine (ZANTAC) 150 MG tablet Take 150 mg by mouth 2 (two) times daily.   No facility-administered encounter medications on file as of 06/28/2016.    ALLERGIES: Allergies  Allergen Reactions  . Amiodarone Nausea Only   VACCINATION STATUS: Immunization History   Administered Date(s) Administered  . Influenza-Unspecified 09/02/2013    Diabetes  He presents for his follow-up diabetic visit. He has type 2 diabetes mellitus. Onset time: He was diagnosed at approximate age of 65 years. His disease course has been stable. There are no hypoglycemic associated symptoms. Pertinent negatives for hypoglycemia include no confusion, headaches, pallor or seizures. There are no diabetic associated symptoms. Pertinent negatives for diabetes include no chest pain, no fatigue, no polydipsia, no polyphagia, no polyuria and no weakness. There are no hypoglycemic complications. Symptoms are stable. Diabetic complications include heart disease. Risk factors for coronary artery disease include dyslipidemia, diabetes mellitus, hypertension and tobacco exposure. He is compliant with treatment most of the time. His weight is stable. He is following a generally healthy diet. He participates in exercise intermittently. His home blood glucose trend is decreasing steadily. His breakfast blood glucose range is generally 130-140 mg/dl. His overall blood glucose range is 130-140 mg/dl. Eye exam is current.  Hypertension  This is a chronic problem. The current episode started more than 1 year ago. Pertinent negatives include no chest pain, headaches, neck pain, palpitations or shortness of breath. Hypertensive end-organ damage includes CAD/MI.  Hyperlipidemia  This is a chronic problem. The current episode started more than 1 year ago. The problem is controlled. Pertinent negatives include no chest pain, myalgias or shortness of breath.     Review of Systems  Constitutional: Positive for unexpected weight change. Negative for fatigue.  HENT: Negative for dental problem, mouth sores and trouble swallowing.   Eyes: Negative for visual disturbance.  Respiratory: Negative for cough, choking, chest tightness, shortness of breath and wheezing.   Cardiovascular: Negative for chest pain,  palpitations and leg swelling.  Gastrointestinal: Negative for abdominal distention, abdominal pain, constipation, diarrhea, nausea and vomiting.  Endocrine: Negative for polydipsia, polyphagia and polyuria.  Genitourinary: Negative for dysuria, flank pain, hematuria and urgency.  Musculoskeletal: Negative for back pain, gait problem, myalgias and neck pain.  Skin: Negative for pallor, rash and wound.  Neurological: Negative for seizures, syncope, weakness, numbness and headaches.  Psychiatric/Behavioral: Negative.  Negative for confusion and dysphoric mood.    Objective:    BP 118/66   Pulse 61   Ht 6' (1.829 m)   Wt 186 lb (84.4 kg)   BMI 25.23 kg/m   Wt Readings from Last 3 Encounters:  06/28/16 186 lb (84.4 kg)  06/20/16 187 lb 6.4 oz (85 kg)  05/04/16 181 lb 6.4 oz (82.3 kg)    Physical Exam  Constitutional: He is oriented to person, place, and time. He appears well-developed and well-nourished. He is cooperative. No distress.  HENT:  Head: Normocephalic and atraumatic.  Eyes: EOM are normal.  Neck: Normal range of motion. Neck supple. No tracheal deviation present. No thyromegaly present.  Cardiovascular: Normal rate, S1 normal, S2 normal and  normal heart sounds.  Exam reveals no gallop.   No murmur heard. Pulses:      Dorsalis pedis pulses are 1+ on the right side, and 1+ on the left side.       Posterior tibial pulses are 1+ on the right side, and 1+ on the left side.  Pulmonary/Chest: Breath sounds normal. No respiratory distress. He has no wheezes.  Abdominal: Soft. Bowel sounds are normal. He exhibits no distension. There is no tenderness. There is no guarding and no CVA tenderness.  Musculoskeletal: He exhibits no edema.       Right shoulder: He exhibits no swelling and no deformity.  Neurological: He is alert and oriented to person, place, and time. He has normal strength and normal reflexes. No cranial nerve deficit or sensory deficit. Gait normal.  Skin: Skin is  warm and dry. No rash noted. No cyanosis. Nails show no clubbing.  Psychiatric: He has a normal mood and affect. His speech is normal and behavior is normal. Judgment and thought content normal. Cognition and memory are normal.    Results for orders placed or performed in visit on 06/20/16  COMPLETE METABOLIC PANEL WITH GFR  Result Value Ref Range   Sodium 141 135 - 146 mmol/L   Potassium 4.7 3.5 - 5.3 mmol/L   Chloride 107 98 - 110 mmol/L   CO2 25 20 - 31 mmol/L   Glucose, Bld 96 65 - 99 mg/dL   BUN 20 7 - 25 mg/dL   Creat 1.15 (H) 0.70 - 1.11 mg/dL   Total Bilirubin 0.7 0.2 - 1.2 mg/dL   Alkaline Phosphatase 84 40 - 115 U/L   AST 19 10 - 35 U/L   ALT 16 9 - 46 U/L   Total Protein 6.0 (L) 6.1 - 8.1 g/dL   Albumin 3.8 3.6 - 5.1 g/dL   Calcium 8.7 8.6 - 10.3 mg/dL   GFR, Est African American 69 >=60 mL/min   GFR, Est Non African American 60 >=60 mL/min  Hemoglobin A1c  Result Value Ref Range   Hgb A1c MFr Bld 7.2 (H) <5.7 %   Mean Plasma Glucose 160 mg/dL   Complete Blood Count (Most recent): Lab Results  Component Value Date   WBC 5.5 05/04/2016   HGB 10.7 (L) 05/04/2016   HCT 32.5 (L) 05/04/2016   MCV 92.1 05/04/2016   PLT 154 05/04/2016   Chemistry (most recent): Lab Results  Component Value Date   NA 141 06/20/2016   K 4.7 06/20/2016   CL 107 06/20/2016   CO2 25 06/20/2016   BUN 20 06/20/2016   CREATININE 1.15 (H) 06/20/2016   Diabetic Labs (most recent): Lab Results  Component Value Date   HGBA1C 7.2 (H) 06/20/2016   HGBA1C 6.6 (H) 03/15/2016   HGBA1C 6.9 (A) 09/08/2015   Lipid profile (most recent): No results found for: TRIG, CHOL       Assessment & Plan:   1. Type 2 diabetes mellitus with vascular disease ([place])  his diabetes is  complicated by coronary artery disease. Patient came with stable glucose profile, and  recent A1c of 7.2 %.  Glucose logs and insulin administration records pertaining to this visit,  to be scanned into patient's  records.  Recent labs reviewed, His creatinine improving to 1.15 With improving GFR of 60. - Patient remains at a high risk for more acute and chronic complications of diabetes which include CAD, CVA, CKD, retinopathy, and neuropathy. These are all discussed in detail with the  patient.  - I have re-counseled the patient on diet management and   by adopting a carbohydrate restricted / protein rich  Diet. - Patient is advised to stick to a routine mealtimes to eat 3 meals  a day and avoid unnecessary snacks ( to snack only to correct hypoglycemia).  - Suggestion is made for patient to avoid simple carbohydrates   from their diet including Cakes , Desserts, Ice Cream,  Soda (  diet and regular) , Sweet Tea , Candies,  Chips, Cookies, Artificial Sweeteners,   and "Sugar-free" Products .  This will help patient to have stable blood glucose profile and potentially avoid unintended  Weight gain.  - The patient  has been  scheduled with Jearld Fenton, RDN, CDE for individualized DM education. - I have approached patient with the following individualized plan to manage diabetes and patient agrees.  -I will keep him off of  Metformin  for now due to CKD.  - I will continue Lantus 15 units qhs, monitor before breakfast everyday. Target A1c for him would be between 7 and 7.5%.   - Patient specific target  for A1c; LDL, HDL, Triglycerides, and  Waist Circumference were discussed in detail.  2) BP/HTN: Controlled. Continue current medications including ACEI. 3) Lipids/HPL: continue statins. 4)  Weight/Diet: CDE consult in progress, exercise, and carbohydrates information provided.  5) Chronic Care/Health Maintenance:  -Patient  on ACEI and Statin medications and encouraged to continue to follow up with Ophthalmology, Podiatrist at least yearly or according to recommendations, and advised to  stay away from smoking. I have recommended yearly flu vaccine and pneumonia vaccination at least every 5 years;  moderate intensity exercise for up to 150 minutes weekly; and  sleep for at least 7 hours a day.  I advised patient to maintain close follow up with their PCP for primary care needs.  Patient is asked to bring meter and  blood glucose logs during their next visit.   Follow up plan: Return in about 3 months (around 09/28/2016) for follow up with pre-visit labs, meter, and logs.  Glade Lloyd, MD Phone: [phone]  Fax: [phone]   06/28/2016, 2:03 PM

## 2016-07-10 DIAGNOSIS — E1142 Type 2 diabetes mellitus with diabetic polyneuropathy: Secondary | ICD-10-CM | POA: Diagnosis not present

## 2016-07-10 DIAGNOSIS — B351 Tinea unguium: Secondary | ICD-10-CM | POA: Diagnosis not present

## 2016-07-25 ENCOUNTER — Emergency Department (HOSPITAL_COMMUNITY): Payer: Medicare Other

## 2016-07-25 ENCOUNTER — Encounter (HOSPITAL_COMMUNITY): Payer: Self-pay | Admitting: Emergency Medicine

## 2016-07-25 ENCOUNTER — Emergency Department (HOSPITAL_COMMUNITY)
Admission: EM | Admit: 2016-07-25 | Discharge: 2016-07-25 | Disposition: A | Discharge status: Home / Self Care | Payer: Medicare Other | Attending: Emergency Medicine | Admitting: Emergency Medicine

## 2016-07-25 DIAGNOSIS — E119 Type 2 diabetes mellitus without complications: Secondary | ICD-10-CM | POA: Diagnosis not present

## 2016-07-25 DIAGNOSIS — I10 Essential (primary) hypertension: Secondary | ICD-10-CM | POA: Insufficient documentation

## 2016-07-25 DIAGNOSIS — Z87891 Personal history of nicotine dependence: Secondary | ICD-10-CM | POA: Insufficient documentation

## 2016-07-25 DIAGNOSIS — Z79899 Other long term (current) drug therapy: Secondary | ICD-10-CM | POA: Insufficient documentation

## 2016-07-25 DIAGNOSIS — Z85828 Personal history of other malignant neoplasm of skin: Secondary | ICD-10-CM | POA: Insufficient documentation

## 2016-07-25 DIAGNOSIS — Y9389 Activity, other specified: Secondary | ICD-10-CM | POA: Diagnosis not present

## 2016-07-25 DIAGNOSIS — M25531 Pain in right wrist: Secondary | ICD-10-CM | POA: Diagnosis not present

## 2016-07-25 DIAGNOSIS — X500XXA Overexertion from strenuous movement or load, initial encounter: Secondary | ICD-10-CM | POA: Insufficient documentation

## 2016-07-25 DIAGNOSIS — Y929 Unspecified place or not applicable: Secondary | ICD-10-CM | POA: Diagnosis not present

## 2016-07-25 DIAGNOSIS — Y999 Unspecified external cause status: Secondary | ICD-10-CM | POA: Insufficient documentation

## 2016-07-25 DIAGNOSIS — S63501A Unspecified sprain of right wrist, initial encounter: Secondary | ICD-10-CM | POA: Diagnosis not present

## 2016-07-25 DIAGNOSIS — I2510 Atherosclerotic heart disease of native coronary artery without angina pectoris: Secondary | ICD-10-CM | POA: Insufficient documentation

## 2016-07-25 DIAGNOSIS — M79641 Pain in right hand: Secondary | ICD-10-CM | POA: Diagnosis not present

## 2016-07-25 DIAGNOSIS — M7989 Other specified soft tissue disorders: Secondary | ICD-10-CM | POA: Diagnosis not present

## 2016-07-25 MED ORDER — HYDROCODONE-ACETAMINOPHEN 5-325 MG PO TABS: ORAL_TABLET | ORAL | 0 refills | Status: DC

## 2016-07-25 MED ORDER — HYDROCODONE-ACETAMINOPHEN 5-325 MG PO TABS
1.0000 | ORAL_TABLET | Freq: Once | ORAL | Status: AC
Start: 2016-07-25 — End: 2016-07-25
  Administered 2016-07-25: 1 via ORAL
  Filled 2016-07-25: qty 1

## 2016-07-25 NOTE — ED Notes (Signed)
Pt taken to xray 

## 2016-07-25 NOTE — ED Provider Notes (Addendum)
Medical screening examination/treatment/procedure(s) were conducted as a shared visit with non-physician practitioner(s) and myself.  I personally evaluated the patient during the encounter.   EKG Interpretation None      Patient with a complaint of right wrist pain after a fall on Saturday. Also complaint of proximal middle finger pain. Swelling to the proximal joint. Swelling over the dorsum of the wrist. Patient when he fell did not feel as if he heard a crack or pop but the pain with the movement is increased over time.  Patients Refill to his fingers is 2 seconds. Radial pulses 2+. Limited range of motion of the middle finger approximately. Limited range of motion at the wrist. No tenderness at the elbow or shoulder.  X-ray of the right wrist hand is pending. Disposition based on results.   Fredia Sorrow, MD 07/25/16 [place], MD 07/25/16 [phone]

## 2016-07-25 NOTE — ED Triage Notes (Signed)
Pt c/o RT wrist pain after using a broom handle to stab at a snake. Pt reports he was not bitten. Pt noted to have edema and redness to wrist. Skin appears to be tight. Cap refill brisk.

## 2016-07-25 NOTE — Discharge Instructions (Signed)
Elevate and apply ice packs on/off to your wrist.  Call one of the orthopedic doctors listed to arrange a follow-up appt.

## 2016-07-25 NOTE — ED Provider Notes (Signed)
[facility] DEPT Provider Note   CSN: [id number] Arrival date & time: 07/25/16  1522     History   Chief Complaint Chief Complaint  Patient presents with  . Wrist Pain    HPI Grant [name] is a 82 y.o. male.  HPI   Grant [name] is a 82 y.o. male who presents to the Emergency Department complaining of right wrist and hand pain for three days.  He states that he was using a broom stick to kill a snake when the handle broke causing him to lunge forward and his weight went onto his right hand.  He reports continued pain and swelling of the hand and wrist.  He has applied ice packs on/off and taken tylenol without relief.  He denies numbness of his hand, elbow pain, redness or open wounds.    Past Medical History:  Diagnosis Date  . Arthritis   . CAD (coronary artery disease)   . Cancer ([place])    skin cancer of forehead  . Diabetes mellitus   . Erectile dysfunction   . GERD (gastroesophageal reflux disease)   . Gout 09-09-13   last flare 8'14 with [facility]bladder attack at [facility]  . Hyperlipidemia   . Hypertension   . Macular degeneration 09-09-13   bilateral"vision is poor"  . Pancreatitis 10--8-14   8'14 -enzymes improved  . Psoriasis 09-09-13   elbows, knees  . RBBB   . Tubular adenoma     Patient Active Problem List   Diagnosis Date Noted  . Essential hypertension, benign 06/28/2016  . Loss of weight 06/20/2016  . Helicobacter pylori gastritis 06/20/2016  . Mucosal abnormality of stomach   . History of colonic polyps   . History of adenomatous polyp of colon 01/27/2015  . Anemia 01/27/2015  . Inguinal hernia, bilateral-containing fat 09/03/2013  . Gallstones 09/03/2013  . Hyperlipidemia 08/03/2013  . CAD (coronary artery disease) 07/31/2013  . Pancreatitis 07/31/2013  . Hyperkalemia 07/31/2013  . Type 2 diabetes mellitus with vascular disease ([place]) 07/31/2013  . Elevated liver enzymes 07/31/2013    Past Surgical History:  Procedure Laterality Date  .  CARDIAC CATHETERIZATION  02/19/2011   3 vessel CAD  . CATARACT EXTRACTION W/PHACO Left 02/14/2016   Procedure: CATARACT EXTRACTION PHACO AND INTRAOCULAR LENS PLACEMENT (IOC);  Surgeon: Rutherford Guys, MD;  Location: [facility];  Service: Ophthalmology;  Laterality: Left;  CDE:6.96  . CATARACT EXTRACTION W/PHACO Right 02/28/2016   Procedure: CATARACT EXTRACTION PHACO AND INTRAOCULAR LENS PLACEMENT (IOC);  Surgeon: Rutherford Guys, MD;  Location: [facility];  Service: Ophthalmology;  Laterality: Right;  CDE:9.54  . CHOLECYSTECTOMY N/A 10/27/2013   Procedure: LAPAROSCOPIC CHOLECYSTECTOMY WITH INTRAOPERATIVE CHOLANGIOGRAM WITH LIVER BIOPSY;  Surgeon: Pedro Earls, MD;  Location: [facility];  Service: General;  Laterality: N/A;  . COLONOSCOPY  02/25/2012   Procedure: COLONOSCOPY;  Surgeon: Daneil Dolin, MD;  Location[facility];  Service: Endoscopy;  Laterality: N/A;  7:30 AM polyps removed.  . COLONOSCOPY N/A 02/11/2015   Dr.Rourk- multiple colonic polyps bx= tubular adenoma and an inflammatory polyp  . CORONARY ARTERY BYPASS GRAFT  02-28-2011   LIMA to LAD,SVG to acute marginal branch of RCA & sequential SVG to OM1 & OM2.  . ESOPHAGOGASTRODUODENOSCOPY N/A 05/10/2016   RMR: mild schatzki ring, medium sized hh, H.pylori gastritis  . NM MYOCAR PERF WALL MOTION  02/15/2011   High Risk  . PILONIDAL CYST EXCISION    . US ECHOCARDIOGRAPHY  11/09/2008   mildly impaired diastolic dysfunction -  Grade I       Home Medications    Prior to Admission medications   Medication Sig Start Date End Date Taking? Authorizing Provider  allopurinol (ZYLOPRIM) 100 MG tablet Take 100 mg by mouth daily with breakfast.     Historical Provider, MD  aspirin 325 MG tablet Take 325 mg by mouth daily.    Historical Provider, MD  atorvastatin (LIPITOR) 40 MG tablet Take 40 mg by mouth every evening.     Historical Provider, MD  betamethasone dipropionate (DIPROLENE) 0.05 % cream Apply 1 application topically daily as needed (siriasis).   11/22/15   Historical Provider, MD  carvedilol (COREG) 12.5 MG tablet Take 12.5 mg by mouth 2 (two) times daily with a meal.    Historical Provider, MD  escitalopram (LEXAPRO) 10 MG tablet Take 10 mg by mouth daily.    Historical Provider, MD  LANTUS SOLOSTAR 100 UNIT/ML Solostar Pen INJECT 20 UNITS SUBCUTANEOUS AT BEDTIME Patient taking differently: INJECT 15 UNITS SUBCUTANEOUS AT BEDTIME 05/29/16   Cassandria Anger, MD  Misc Natural Products (TART CHERRY ADVANCED PO) Take 1,200 mg by mouth daily.    Historical Provider, MD  Multiple Vitamins-Minerals (PRESERVISION AREDS PO) Take 1 capsule by mouth 2 (two) times daily.     Historical Provider, MD  pantoprazole (PROTONIX) 40 MG tablet TAKE 1 TABLET BY MOUTH TWICE DAILY 05/29/16   Carlis Stable, NP  quinapril (ACCUPRIL) 20 MG tablet Take 20 mg by mouth every morning.     Historical Provider, MD  ranitidine (ZANTAC) 150 MG tablet Take 150 mg by mouth 2 (two) times daily.    Historical Provider, MD    Family History Family History  Problem Relation Age of Onset  . Stroke Father   . Cancer Mother     leukemia  . Heart failure Brother   . Cancer Sister     pancreas & liver & liver  . Hypertension Sister   . Cancer Sister     breast  . Heart failure Sister   . Anesthesia problems Neg Hx   . Hypotension Neg Hx   . Malignant hyperthermia Neg Hx   . Pseudochol deficiency Neg Hx   . Colon cancer Neg Hx     Social History Social History  Substance Use Topics  . Smoking status: Former Smoker    Packs/day: 0.50    Years: 25.00    Types: Cigarettes    Quit date: 02/25/1983  . Smokeless tobacco: Never Used  . Alcohol use Yes     Comment: occasional     Allergies   Amiodarone   Review of Systems Review of Systems  Constitutional: Negative for chills and fever.  Genitourinary: Negative for difficulty urinating and dysuria.  Musculoskeletal: Positive for arthralgias and joint swelling.  Skin: Negative for color change and wound.    All other systems reviewed and are negative.    Physical Exam Updated Vital Signs BP 156/61 (BP Location: Left Arm)   Pulse 68   Temp 98.3 F (36.8 C) (Oral)   Resp 22   Ht 6' (1.829 m)   Wt 84.8 kg   SpO2 97%   BMI 25.36 kg/m   Physical Exam  Constitutional: He is oriented to person, place, and time. He appears well-developed and well-nourished. No distress.  HENT:  Head: Normocephalic and atraumatic.  Neck: Normal range of motion.  Cardiovascular: Normal rate, regular rhythm and intact distal pulses.   Pulmonary/Chest: Effort normal and breath sounds normal.  Musculoskeletal:  He exhibits tenderness. He exhibits no edema.   Diffuse edema and tenderness of the dorsal right hand and wrist. Radial pulse is brisk, distal sensation intact.  CR 2 sec.  No bruising or bony deformity.  Compartments soft  Neurological: He is alert and oriented to person, place, and time. He exhibits normal muscle tone. Coordination normal.  Skin: Skin is warm and dry.  Nursing note and vitals reviewed.    ED Treatments / Results  Labs (all labs ordered are listed, but only abnormal results are displayed) Labs Reviewed - No data to display  EKG  EKG Interpretation None       Radiology Dg Wrist Complete Right  Result Date: 07/25/2016 CLINICAL DATA:  [place] while holding a broom.  Pain and swelling. EXAM: RIGHT WRIST - COMPLETE 3+ VIEW; RIGHT HAND - COMPLETE 3+ VIEW COMPARISON:  None. FINDINGS: No acute fracture deformity or dislocation. Joint space intact without erosions. No destructive bony lesions. Soft tissue planes are not suspicious. Mild fluffy intra-articular calcifications at the wrist most compatible with CPPD. Mild vascular calcifications. Radiopaque suture material within dorsum of the hand. IMPRESSION: No acute fracture deformity nor dislocation. Electronically Signed   By: Elon Alas M.D.   On: 07/25/2016 16:25   Dg Hand Complete Right  Result Date: 07/25/2016 CLINICAL  DATA:  [place] while holding a broom.  Pain and swelling. EXAM: RIGHT WRIST - COMPLETE 3+ VIEW; RIGHT HAND - COMPLETE 3+ VIEW COMPARISON:  None. FINDINGS: No acute fracture deformity or dislocation. Joint space intact without erosions. No destructive bony lesions. Soft tissue planes are not suspicious. Mild fluffy intra-articular calcifications at the wrist most compatible with CPPD. Mild vascular calcifications. Radiopaque suture material within dorsum of the hand. IMPRESSION: No acute fracture deformity nor dislocation. Electronically Signed   By: Elon Alas M.D.   On: 07/25/2016 16:25    Procedures Procedures (including critical care time)  Medications Ordered in ED Medications  HYDROcodone-acetaminophen (NORCO/VICODIN) 5-325 MG per tablet 1 tablet (not administered)     Initial Impression / Assessment and Plan / ED Course  I have reviewed the triage vital signs and the nursing notes.  Pertinent labs & imaging results that were available during my care of the patient were reviewed by me and considered in my medical decision making (see chart for details).  Clinical Course   Wrist pain, edema , NV intact  Pt also seen by Dr. Rogene Houston and care plan discussed.    XR neg for fx, but clinically concerning for possible ligament injury.  Pt placed in velcro wrist splint and sling given.  Pt agrees to orthopedic referal  Final Clinical Impressions(s) / ED Diagnoses   Final diagnoses:  Wrist sprain, right, initial encounter    New Prescriptions New Prescriptions   No medications on file     Kem Parkinson, PA-C 07/25/16 1702

## 2016-07-31 DIAGNOSIS — Z8739 Personal history of other diseases of the musculoskeletal system and connective tissue: Secondary | ICD-10-CM | POA: Diagnosis not present

## 2016-07-31 DIAGNOSIS — M25531 Pain in right wrist: Secondary | ICD-10-CM | POA: Diagnosis not present

## 2016-08-02 ENCOUNTER — Encounter: Payer: Self-pay | Admitting: Cardiovascular Disease

## 2016-08-02 ENCOUNTER — Ambulatory Visit (INDEPENDENT_AMBULATORY_CARE_PROVIDER_SITE_OTHER): Payer: Medicare Other | Admitting: Cardiovascular Disease

## 2016-08-02 ENCOUNTER — Telehealth: Payer: Self-pay

## 2016-08-02 ENCOUNTER — Other Ambulatory Visit: Payer: Self-pay

## 2016-08-02 VITALS — BP 132/65 | HR 58 | Ht 72.0 in | Wt 188.4 lb

## 2016-08-02 DIAGNOSIS — I453 Trifascicular block: Secondary | ICD-10-CM | POA: Insufficient documentation

## 2016-08-02 DIAGNOSIS — B9681 Helicobacter pylori [H. pylori] as the cause of diseases classified elsewhere: Secondary | ICD-10-CM

## 2016-08-02 DIAGNOSIS — I2510 Atherosclerotic heart disease of native coronary artery without angina pectoris: Secondary | ICD-10-CM | POA: Diagnosis not present

## 2016-08-02 DIAGNOSIS — M25431 Effusion, right wrist: Secondary | ICD-10-CM | POA: Diagnosis not present

## 2016-08-02 DIAGNOSIS — K2970 Gastritis, unspecified, without bleeding: Principal | ICD-10-CM

## 2016-08-02 DIAGNOSIS — I10 Essential (primary) hypertension: Secondary | ICD-10-CM | POA: Diagnosis not present

## 2016-08-02 DIAGNOSIS — E1159 Type 2 diabetes mellitus with other circulatory complications: Secondary | ICD-10-CM

## 2016-08-02 DIAGNOSIS — E785 Hyperlipidemia, unspecified: Secondary | ICD-10-CM | POA: Diagnosis not present

## 2016-08-02 DIAGNOSIS — R29898 Other symptoms and signs involving the musculoskeletal system: Secondary | ICD-10-CM | POA: Diagnosis not present

## 2016-08-02 DIAGNOSIS — M25531 Pain in right wrist: Secondary | ICD-10-CM | POA: Diagnosis not present

## 2016-08-02 DIAGNOSIS — M25631 Stiffness of right wrist, not elsewhere classified: Secondary | ICD-10-CM | POA: Diagnosis not present

## 2016-08-02 NOTE — Telephone Encounter (Signed)
Pt was on my reminder list for H Pylori stool Ag in 08/2016. I am mailing him the orders and letter to remind him to come by office to pick up container which will be at the front.

## 2016-08-02 NOTE — Patient Instructions (Signed)
NO CHANGES WITH MEDICATIONS  Your physician wants you to follow-up in: [place]. You will receive a reminder letter in the mail two months in advance. If you don't receive a letter, please call our office to schedule the follow-up appointment.  If you need a refill on your cardiac medications before your next appointment, please call your pharmacy.

## 2016-08-02 NOTE — Progress Notes (Signed)
Cardiology Office Note    Date:  08/02/2016   ID:  Grant [name], DOB 1934-08-10, MRN [id number]  PCP:  Glo Herring., MD  Cardiologist:   Sanda Klein, MD   Chief Complaint  Patient presents with  . Follow-up    12 Months; cramping in legs occasionally in the mornings.     History of Present Illness:  Grant [name] is a 82 y.o. male of coronary artery disease who is now roughly 5 years status post multivessel bypass surgery and also has hyperlipidemia, hypertension, mild diabetes mellitus, history of gout and advanced macular degeneration. He lost quite a bit of weight due to anorexia, but the problems resolved after he received treatment for H. pylori. With the weight loss he no longer requires metformin. He had transient renal dysfunction which is also improving. His most recent hemoglobin A1c was 7%.  He tried to hit a snake with a broom and the handle broke. He has a severe sprain to his right hand and is wearing a splint  Grant [name] underwent four-vessel bypass surgery in 2012 (LIMA to LAD, SVG to acute marginal-RCA, sequential SVG to OM1 and OM 2) by Dr. Roxan [name]. He has treated hyperlipidemia, hypertension, type 2 diabetes mellitus and history of gout. He has a chronic right bundle branch block and in 2015 also developed a left anterior fascicular block.  Denies angina or dyspnea with activity, leg edema, claudication, palpitations, syncope, new focal neurological complaints or leg edema.   Past Medical History:  Diagnosis Date  . Arthritis   . CAD (coronary artery disease)   . Cancer ([place])    skin cancer of forehead  . Diabetes mellitus   . Erectile dysfunction   . GERD (gastroesophageal reflux disease)   . Gout 09-09-13   last flare 8'14 with gallbladder attack at [facility]  . Hyperlipidemia   . Hypertension   . Macular degeneration 09-09-13   bilateral"vision is poor"  . Pancreatitis 10--8-14   8'14 -enzymes improved  . Psoriasis 09-09-13   elbows, knees   . RBBB   . Tubular adenoma     Past Surgical History:  Procedure Laterality Date  . CARDIAC CATHETERIZATION  02/19/2011   3 vessel CAD  . CATARACT EXTRACTION W/PHACO Left 02/14/2016   Procedure: CATARACT EXTRACTION PHACO AND INTRAOCULAR LENS PLACEMENT (IOC);  Surgeon: Rutherford Guys, MD;  Location: [facility];  Service: Ophthalmology;  Laterality: Left;  CDE:6.96  . CATARACT EXTRACTION W/PHACO Right 02/28/2016   Procedure: CATARACT EXTRACTION PHACO AND INTRAOCULAR LENS PLACEMENT (IOC);  Surgeon: Rutherford Guys, MD;  Location: [facility];  Service: Ophthalmology;  Laterality: Right;  CDE:9.54  . CHOLECYSTECTOMY N/A 10/27/2013   Procedure: LAPAROSCOPIC CHOLECYSTECTOMY WITH INTRAOPERATIVE CHOLANGIOGRAM WITH LIVER BIOPSY;  Surgeon: Pedro Earls, MD;  Location: [facility];  Service: General;  Laterality: N/A;  . COLONOSCOPY  02/25/2012   Procedure: COLONOSCOPY;  Surgeon: Daneil Dolin, MD;  Location: [facility];  Service: Endoscopy;  Laterality: N/A;  7:30 AM polyps removed.  . COLONOSCOPY N/A 02/11/2015   Dr.Rourk- multiple colonic polyps bx= tubular adenoma and an inflammatory polyp  . CORONARY ARTERY BYPASS GRAFT  02-28-2011   LIMA to LAD,SVG to acute marginal branch of RCA & sequential SVG to OM1 & OM2.  . ESOPHAGOGASTRODUODENOSCOPY N/A 05/10/2016   RMR: mild schatzki ring, medium sized hh, H.pylori gastritis  . NM MYOCAR PERF WALL MOTION  02/15/2011   High Risk  . PILONIDAL CYST EXCISION    . US ECHOCARDIOGRAPHY  11/09/2008  mildly impaired diastolic dysfunction - Grade I    Current Medications: Outpatient Medications Prior to Visit  Medication Sig Dispense Refill  . allopurinol (ZYLOPRIM) 100 MG tablet Take 100 mg by mouth daily with breakfast.     . aspirin 325 MG tablet Take 325 mg by mouth daily.    Marland Kitchen atorvastatin (LIPITOR) 40 MG tablet Take 40 mg by mouth every evening.     . betamethasone dipropionate (DIPROLENE) 0.05 % cream Apply 1 application topically daily as needed (siriasis).   12    . carvedilol (COREG) 12.5 MG tablet Take 12.5 mg by mouth 2 (two) times daily with a meal.    . escitalopram (LEXAPRO) 10 MG tablet Take 10 mg by mouth daily.    Marland Kitchen HYDROcodone-acetaminophen (NORCO/VICODIN) 5-325 MG tablet Take one tab po q 4-6 hrs prn pain 20 tablet 0  . LANTUS SOLOSTAR 100 UNIT/ML Solostar Pen INJECT 20 UNITS SUBCUTANEOUS AT BEDTIME (Patient taking differently: INJECT 15 UNITS SUBCUTANEOUS AT BEDTIME) 15 mL 2  . Misc Natural Products (TART CHERRY ADVANCED PO) Take 1,200 mg by mouth daily.    . Multiple Vitamins-Minerals (PRESERVISION AREDS PO) Take 1 capsule by mouth 2 (two) times daily.     . pantoprazole (PROTONIX) 40 MG tablet TAKE 1 TABLET BY MOUTH TWICE DAILY 180 tablet 2  . quinapril (ACCUPRIL) 20 MG tablet Take 20 mg by mouth every morning.     . ranitidine (ZANTAC) 150 MG tablet Take 150 mg by mouth 2 (two) times daily.     No facility-administered medications prior to visit.      Allergies:   Amiodarone   Social History   Social History  . Marital status: Married    Spouse name: N/A  . Number of children: N/A  . Years of education: N/A   Social History Main Topics  . Smoking status: Former Smoker    Packs/day: 0.50    Years: 25.00    Types: Cigarettes    Quit date: 02/25/1983  . Smokeless tobacco: Never Used  . Alcohol use Yes     Comment: occasional  . Drug use: No  . Sexual activity: No   Other Topics Concern  . None   Social History Narrative  . None     Family History:  The patient's family history includes Cancer in his mother, sister, and sister; Heart failure in his brother and sister; Hypertension in his sister; Stroke in his father.   ROS:   Please see the history of present illness.    ROS All other systems reviewed and are negative.   PHYSICAL EXAM:   VS:  BP 132/65   Pulse (!) 58   Ht 6' (1.829 m)   Wt 188 lb 6.4 oz (85.5 kg)   BMI 25.55 kg/m    GEN: Well nourished, well developed, in no acute distress  HEENT: normal   Neck: no JVD, carotid bruits, or masses Cardiac: Widely split S2, RRR; no murmurs, rubs, or gallops,no edema  Respiratory:  clear to auscultation bilaterally, normal work of breathing GI: soft, nontender, nondistended, + BS MS: no deformity or atrophy, splint right hand  Skin: warm and dry, no rash Neuro:  Alert and Oriented x 3, Strength and sensation are intact Psych: euthymic mood, full affect  Wt Readings from Last 3 Encounters:  08/02/16 188 lb 6.4 oz (85.5 kg)  07/25/16 187 lb (84.8 kg)  06/28/16 186 lb (84.4 kg)      Studies/Labs Reviewed:   EKG:  EKG  is ordered today.  The ekg ordered today Is essentially unchanged from a year ago: Sinus bradycardia, first-degree AV block (246 ms) right bundle branch block, left anterior fascicular block, QTC 437.  Recent Labs: 05/04/2016: Hemoglobin 10.7; Platelets 154; TSH 1.47 06/20/2016: ALT 16; BUN 20; Creat 1.15; Potassium 4.7; Sodium 141     ASSESSMENT:    1. Coronary artery disease involving native coronary artery of native heart without angina pectoris   2. Essential hypertension, benign   3. Hyperlipidemia   4. Type 2 diabetes mellitus with vascular disease ([place])   5. Trifascicular block      PLAN:  In order of problems listed above:  1. CAD s/p CABG; Asymptomatic 2. HTN: Well controlled 3. HLP: On statin, get labs from PCP 4. DM: Fair control, note weight loss; no longer taking pharmaceutical agents for diabetes 5. Trifascicular block: Without symptoms to suggest high-grade AV block. No indication for pacemaker that this time. Asked to contact us immediately should he developed abrupt syncope/near-syncope, extreme fatigue or dyspnea.    Medication Adjustments/Labs and Tests Ordered: Current medicines are reviewed at length with the patient today.  Concerns regarding medicines are outlined above.  Medication changes, Labs and Tests ordered today are listed in the Patient Instructions below. Patient Instructions  NO  CHANGES WITH MEDICATIONS  Your physician wants you to follow-up in: [place]. You will receive a reminder letter in the mail two months in advance. If you don't receive a letter, please call our office to schedule the follow-up appointment.  If you need a refill on your cardiac medications before your next appointment, please call your pharmacy.      Signed, Sanda Klein, MD  08/02/2016 2:29 PM    [address], [place], [place]  [address] Phone: [phone]; Fax: [phone]

## 2016-08-08 DIAGNOSIS — M25641 Stiffness of right hand, not elsewhere classified: Secondary | ICD-10-CM | POA: Diagnosis not present

## 2016-08-08 DIAGNOSIS — R29898 Other symptoms and signs involving the musculoskeletal system: Secondary | ICD-10-CM | POA: Diagnosis not present

## 2016-08-08 DIAGNOSIS — M25631 Stiffness of right wrist, not elsewhere classified: Secondary | ICD-10-CM | POA: Diagnosis not present

## 2016-08-08 DIAGNOSIS — M25431 Effusion, right wrist: Secondary | ICD-10-CM | POA: Diagnosis not present

## 2016-08-15 DIAGNOSIS — M25641 Stiffness of right hand, not elsewhere classified: Secondary | ICD-10-CM | POA: Diagnosis not present

## 2016-08-15 DIAGNOSIS — M25431 Effusion, right wrist: Secondary | ICD-10-CM | POA: Diagnosis not present

## 2016-08-15 DIAGNOSIS — R29898 Other symptoms and signs involving the musculoskeletal system: Secondary | ICD-10-CM | POA: Diagnosis not present

## 2016-08-22 DIAGNOSIS — I129 Hypertensive chronic kidney disease with stage 1 through stage 4 chronic kidney disease, or unspecified chronic kidney disease: Secondary | ICD-10-CM | POA: Diagnosis not present

## 2016-08-22 DIAGNOSIS — N183 Chronic kidney disease, stage 3 (moderate): Secondary | ICD-10-CM | POA: Diagnosis not present

## 2016-08-22 DIAGNOSIS — E1122 Type 2 diabetes mellitus with diabetic chronic kidney disease: Secondary | ICD-10-CM | POA: Diagnosis not present

## 2016-08-22 DIAGNOSIS — M109 Gout, unspecified: Secondary | ICD-10-CM | POA: Diagnosis not present

## 2016-08-23 DIAGNOSIS — M7021 Olecranon bursitis, right elbow: Secondary | ICD-10-CM | POA: Diagnosis not present

## 2016-08-23 DIAGNOSIS — M25531 Pain in right wrist: Secondary | ICD-10-CM | POA: Diagnosis not present

## 2016-08-28 ENCOUNTER — Telehealth: Payer: Self-pay | Admitting: Internal Medicine

## 2016-08-28 NOTE — Telephone Encounter (Signed)
I spoke with the pts wife and answered her questions.

## 2016-08-28 NOTE — Telephone Encounter (Signed)
Pt's wife called to say that they have the stool specimen to take to the lab, but she had some questions first and needed to speak with the nurse. Please call [phone]

## 2016-08-30 ENCOUNTER — Other Ambulatory Visit: Payer: Self-pay

## 2016-08-30 DIAGNOSIS — B9681 Helicobacter pylori [H. pylori] as the cause of diseases classified elsewhere: Secondary | ICD-10-CM

## 2016-08-30 DIAGNOSIS — K2970 Gastritis, unspecified, without bleeding: Principal | ICD-10-CM

## 2016-08-31 LAB — HELICOBACTER PYLORI  SPECIAL ANTIGEN: H. PYLORI ANTIGEN STOOL: NOT DETECTED

## 2016-09-06 DIAGNOSIS — Z23 Encounter for immunization: Secondary | ICD-10-CM | POA: Diagnosis not present

## 2016-09-10 ENCOUNTER — Encounter: Payer: Self-pay | Admitting: Internal Medicine

## 2016-09-10 NOTE — Progress Notes (Signed)
MADE APPOINTMENT AND MAILED LETTER  °

## 2016-09-10 NOTE — Progress Notes (Signed)
Please let patient know his H.pylori stool antigen was negative.  Resume PPI if he hasn't already. Keep upcoming ov with rmr due in 10/2016. Make appt if schedule is out.

## 2016-09-28 ENCOUNTER — Other Ambulatory Visit: Payer: Self-pay | Admitting: "Endocrinology

## 2016-09-28 DIAGNOSIS — E1159 Type 2 diabetes mellitus with other circulatory complications: Secondary | ICD-10-CM | POA: Diagnosis not present

## 2016-09-28 LAB — COMPLETE METABOLIC PANEL WITH GFR
ALBUMIN: 3.9 g/dL (ref 3.6–5.1)
ALK PHOS: 77 U/L (ref 40–115)
ALT: 14 U/L (ref 9–46)
AST: 16 U/L (ref 10–35)
BILIRUBIN TOTAL: 0.7 mg/dL (ref 0.2–1.2)
BUN: 28 mg/dL — AB (ref 7–25)
CALCIUM: 8.9 mg/dL (ref 8.6–10.3)
CO2: 28 mmol/L (ref 20–31)
Chloride: 104 mmol/L (ref 98–110)
Creat: 1.34 mg/dL — ABNORMAL HIGH (ref 0.70–1.11)
GFR, EST NON AFRICAN AMERICAN: 50 mL/min — AB (ref >=60)
GFR, Est African American: 57 mL/min — ABNORMAL LOW (ref >=60)
GLUCOSE: 175 mg/dL — AB (ref 65–99)
POTASSIUM: 5.4 mmol/L — AB (ref 3.5–5.3)
SODIUM: 138 mmol/L (ref 135–146)
TOTAL PROTEIN: 5.9 g/dL — AB (ref 6.1–8.1)

## 2016-09-28 LAB — HEMOGLOBIN A1C
HEMOGLOBIN A1C: 6.8 % — AB (ref <5.7)
Mean Plasma Glucose: 148 mg/dL

## 2016-10-04 ENCOUNTER — Other Ambulatory Visit: Payer: Self-pay | Admitting: "Endocrinology

## 2016-10-04 ENCOUNTER — Ambulatory Visit (INDEPENDENT_AMBULATORY_CARE_PROVIDER_SITE_OTHER): Payer: Medicare Other | Admitting: "Endocrinology

## 2016-10-04 ENCOUNTER — Encounter: Payer: Self-pay | Admitting: "Endocrinology

## 2016-10-04 VITALS — BP 118/60 | HR 66 | Resp 18 | Ht 72.0 in | Wt 190.0 lb

## 2016-10-04 DIAGNOSIS — E875 Hyperkalemia: Secondary | ICD-10-CM

## 2016-10-04 DIAGNOSIS — E782 Mixed hyperlipidemia: Secondary | ICD-10-CM | POA: Diagnosis not present

## 2016-10-04 DIAGNOSIS — I2510 Atherosclerotic heart disease of native coronary artery without angina pectoris: Secondary | ICD-10-CM | POA: Diagnosis not present

## 2016-10-04 DIAGNOSIS — I10 Essential (primary) hypertension: Secondary | ICD-10-CM

## 2016-10-04 DIAGNOSIS — E1159 Type 2 diabetes mellitus with other circulatory complications: Secondary | ICD-10-CM | POA: Diagnosis not present

## 2016-10-04 MED ORDER — FUROSEMIDE 20 MG PO TABS: 20.0000 mg | ORAL_TABLET | Freq: Every day | ORAL | 0 refills | Status: DC

## 2016-10-04 NOTE — Progress Notes (Signed)
Subjective:    Patient ID: Grant [name], male    DOB: 1934/06/02,    Past Medical History:  Diagnosis Date  . Arthritis   . CAD (coronary artery disease)   . Cancer ([place])    skin cancer of forehead  . Diabetes mellitus   . Erectile dysfunction   . GERD (gastroesophageal reflux disease)   . Gout 09-09-13   last flare 8'14 with [facility]bladder attack at [facility]  . Hyperlipidemia   . Hypertension   . Macular degeneration 09-09-13   bilateral"vision is poor"  . Pancreatitis 10--8-14   8'14 -enzymes improved  . Psoriasis 09-09-13   elbows, knees  . RBBB   . Tubular adenoma    Past Surgical History:  Procedure Laterality Date  . CARDIAC CATHETERIZATION  02/19/2011   3 vessel CAD  . CATARACT EXTRACTION W/PHACO Left 02/14/2016   Procedure: CATARACT EXTRACTION PHACO AND INTRAOCULAR LENS PLACEMENT (IOC);  Surgeon: Rutherford Guys, MD;  Location: [facility];  Service: Ophthalmology;  Laterality: Left;  CDE:6.96  . CATARACT EXTRACTION W/PHACO Right 02/28/2016   Procedure: CATARACT EXTRACTION PHACO AND INTRAOCULAR LENS PLACEMENT (IOC);  Surgeon: Rutherford Guys, MD;  Location: [facility];  Service: Ophthalmology;  Laterality: Right;  CDE:9.54  . CHOLECYSTECTOMY N/A 10/27/2013   Procedure: LAPAROSCOPIC CHOLECYSTECTOMY WITH INTRAOPERATIVE CHOLANGIOGRAM WITH LIVER BIOPSY;  Surgeon: Pedro Earls, MD;  Location: [facility];  Service: General;  Laterality: N/A;  . COLONOSCOPY  02/25/2012   Procedure: COLONOSCOPY;  Surgeon: Daneil Dolin, MD;  Location[facility];  Service: Endoscopy;  Laterality: N/A;  7:30 AM polyps removed.  . COLONOSCOPY N/A 02/11/2015   Dr.Rourk- multiple colonic polyps bx= tubular adenoma and an inflammatory polyp  . CORONARY ARTERY BYPASS GRAFT  02-28-2011   LIMA to LAD,SVG to acute marginal branch of RCA & sequential SVG to OM1 & OM2.  . ESOPHAGOGASTRODUODENOSCOPY N/A 05/10/2016   RMR: mild schatzki ring, medium sized hh, H.pylori gastritis  . NM MYOCAR PERF WALL MOTION  02/15/2011    High Risk  . PILONIDAL CYST EXCISION    . US ECHOCARDIOGRAPHY  11/09/2008   mildly impaired diastolic dysfunction - Grade I   Social History   Social History  . Marital status: Married    Spouse name: N/A  . Number of children: N/A  . Years of education: N/A   Social History Main Topics  . Smoking status: Former Smoker    Packs/day: 0.50    Years: 25.00    Types: Cigarettes    Quit date: 02/25/1983  . Smokeless tobacco: Never Used  . Alcohol use Yes     Comment: occasional  . Drug use: No  . Sexual activity: No   Other Topics Concern  . None   Social History Narrative  . None   Outpatient Encounter Prescriptions as of 10/04/2016  Medication Sig  . allopurinol (ZYLOPRIM) 100 MG tablet Take 100 mg by mouth daily with breakfast.   . aspirin 325 MG tablet Take 325 mg by mouth daily.  Marland Kitchen atorvastatin (LIPITOR) 40 MG tablet Take 40 mg by mouth every evening.   . betamethasone dipropionate (DIPROLENE) 0.05 % cream Apply 1 application topically daily as needed (siriasis).   . carvedilol (COREG) 12.5 MG tablet Take 12.5 mg by mouth 2 (two) times daily with a meal.  . escitalopram (LEXAPRO) 10 MG tablet Take 10 mg by mouth daily.  Marland Kitchen HYDROcodone-acetaminophen (NORCO/VICODIN) 5-325 MG tablet Take one tab po q 4-6 hrs prn pain  .  LANTUS SOLOSTAR 100 UNIT/ML Solostar Pen INJECT 20 UNITS SUBCUTANEOUS AT BEDTIME (Patient taking differently: INJECT 15 UNITS SUBCUTANEOUS AT BEDTIME)  . Misc Natural Products (TART CHERRY ADVANCED PO) Take 1,200 mg by mouth daily.  . Multiple Vitamins-Minerals (PRESERVISION AREDS PO) Take 1 capsule by mouth 2 (two) times daily.   . pantoprazole (PROTONIX) 40 MG tablet TAKE 1 TABLET BY MOUTH TWICE DAILY  . quinapril (ACCUPRIL) 20 MG tablet Take 20 mg by mouth every morning.   . ranitidine (ZANTAC) 150 MG tablet Take 150 mg by mouth 2 (two) times daily.  . furosemide (LASIX) 20 MG tablet Take 1 tablet (20 mg total) by mouth daily.   No facility-administered  encounter medications on file as of 10/04/2016.    ALLERGIES: Allergies  Allergen Reactions  . Amiodarone Nausea Only   VACCINATION STATUS: Immunization History  Administered Date(s) Administered  . Influenza-Unspecified 09/02/2013    Diabetes  He presents for his follow-up diabetic visit. He has type 2 diabetes mellitus. Onset time: He was diagnosed at approximate age of 24 years. His disease course has been stable. There are no hypoglycemic associated symptoms. Pertinent negatives for hypoglycemia include no confusion, headaches, pallor or seizures. There are no diabetic associated symptoms. Pertinent negatives for diabetes include no chest pain, no fatigue, no polydipsia, no polyphagia, no polyuria and no weakness. There are no hypoglycemic complications. Symptoms are stable. Diabetic complications include heart disease. Risk factors for coronary artery disease include dyslipidemia, diabetes mellitus, hypertension and tobacco exposure. He is compliant with treatment most of the time. His weight is stable. He is following a generally healthy diet. He participates in exercise intermittently. His home blood glucose trend is decreasing steadily. His breakfast blood glucose range is generally 130-140 mg/dl. His overall blood glucose range is 130-140 mg/dl. Eye exam is current.  Hypertension  This is a chronic problem. The current episode started more than 1 year ago. Pertinent negatives include no chest pain, headaches, neck pain, palpitations or shortness of breath. Hypertensive end-organ damage includes CAD/MI.  Hyperlipidemia  This is a chronic problem. The current episode started more than 1 year ago. The problem is controlled. Pertinent negatives include no chest pain, myalgias or shortness of breath.     Review of Systems  Constitutional: Positive for unexpected weight change. Negative for fatigue.  HENT: Negative for dental problem, mouth sores and trouble swallowing.   Eyes: Negative  for visual disturbance.  Respiratory: Negative for cough, choking, chest tightness, shortness of breath and wheezing.   Cardiovascular: Negative for chest pain, palpitations and leg swelling.  Gastrointestinal: Negative for abdominal distention, abdominal pain, constipation, diarrhea, nausea and vomiting.  Endocrine: Negative for polydipsia, polyphagia and polyuria.  Genitourinary: Negative for dysuria, flank pain, hematuria and urgency.  Musculoskeletal: Negative for back pain, gait problem, myalgias and neck pain.  Skin: Negative for pallor, rash and wound.  Neurological: Negative for seizures, syncope, weakness, numbness and headaches.  Psychiatric/Behavioral: Negative.  Negative for confusion and dysphoric mood.    Objective:    BP 118/60   Pulse 66   Resp 18   Ht 6' (1.829 m)   Wt 190 lb (86.2 kg)   SpO2 97%   BMI 25.77 kg/m   Wt Readings from Last 3 Encounters:  10/04/16 190 lb (86.2 kg)  08/02/16 188 lb 6.4 oz (85.5 kg)  07/25/16 187 lb (84.8 kg)    Physical Exam  Constitutional: He is oriented to person, place, and time. He appears well-developed and well-nourished. He is cooperative.  No distress.  HENT:  Head: Normocephalic and atraumatic.  Eyes: EOM are normal.  Neck: Normal range of motion. Neck supple. No tracheal deviation present. No thyromegaly present.  Cardiovascular: Normal rate, S1 normal, S2 normal and normal heart sounds.  Exam reveals no gallop.   No murmur heard. Pulses:      Dorsalis pedis pulses are 1+ on the right side, and 1+ on the left side.       Posterior tibial pulses are 1+ on the right side, and 1+ on the left side.  Pulmonary/Chest: Breath sounds normal. No respiratory distress. He has no wheezes.  Abdominal: Soft. Bowel sounds are normal. He exhibits no distension. There is no tenderness. There is no guarding and no CVA tenderness.  Musculoskeletal: He exhibits no edema.       Right shoulder: He exhibits no swelling and no deformity.   Neurological: He is alert and oriented to person, place, and time. He has normal strength and normal reflexes. No cranial nerve deficit or sensory deficit. Gait normal.  Skin: Skin is warm and dry. No rash noted. No cyanosis. Nails show no clubbing.  Psychiatric: He has a normal mood and affect. His speech is normal and behavior is normal. Judgment and thought content normal. Cognition and memory are normal.    Results for orders placed or performed in visit on 09/28/16  COMPLETE METABOLIC PANEL WITH GFR  Result Value Ref Range   Sodium 138 135 - 146 mmol/L   Potassium 5.4 (H) 3.5 - 5.3 mmol/L   Chloride 104 98 - 110 mmol/L   CO2 28 20 - 31 mmol/L   Glucose, Bld 175 (H) 65 - 99 mg/dL   BUN 28 (H) 7 - 25 mg/dL   Creat 1.34 (H) 0.70 - 1.11 mg/dL   Total Bilirubin 0.7 0.2 - 1.2 mg/dL   Alkaline Phosphatase 77 40 - 115 U/L   AST 16 10 - 35 U/L   ALT 14 9 - 46 U/L   Total Protein 5.9 (L) 6.1 - 8.1 g/dL   Albumin 3.9 3.6 - 5.1 g/dL   Calcium 8.9 8.6 - 10.3 mg/dL   GFR, Est African American 57 (L) >=60 mL/min   GFR, Est Non African American 50 (L) >=60 mL/min  Hemoglobin A1c  Result Value Ref Range   Hgb A1c MFr Bld 6.8 (H) <5.7 %   Mean Plasma Glucose 148 mg/dL   Complete Blood Count (Most recent): Lab Results  Component Value Date   WBC 5.5 05/04/2016   HGB 10.7 (L) 05/04/2016   HCT 32.5 (L) 05/04/2016   MCV 92.1 05/04/2016   PLT 154 05/04/2016   Chemistry (most recent): Lab Results  Component Value Date   NA 138 09/28/2016   K 5.4 (H) 09/28/2016   CL 104 09/28/2016   CO2 28 09/28/2016   BUN 28 (H) 09/28/2016   CREATININE 1.34 (H) 09/28/2016   Diabetic Labs (most recent): Lab Results  Component Value Date   HGBA1C 6.8 (H) 09/28/2016   HGBA1C 7.2 (H) 06/20/2016   HGBA1C 6.6 (H) 03/15/2016     Assessment & Plan:   1. Type 2 diabetes mellitus with vascular disease ([place])  his diabetes is  complicated by coronary artery disease. Patient came with stable glucose  profile, and  recent A1c of 6.8 %.  Glucose logs and insulin administration records pertaining to this visit,  to be scanned into patient's records.  Recent labs reviewed, His creatinine improving to 1.15 With improving GFR of 60. -  Patient remains at a high risk for more acute and chronic complications of diabetes which include CAD, CVA, CKD, retinopathy, and neuropathy. These are all discussed in detail with the patient.  - I have re-counseled the patient on diet management and   by adopting a carbohydrate restricted / protein rich  Diet. - Patient is advised to stick to a routine mealtimes to eat 3 meals  a day and avoid unnecessary snacks ( to snack only to correct hypoglycemia).  - Suggestion is made for patient to avoid simple carbohydrates   from their diet including Cakes , Desserts, Ice Cream,  Soda (  diet and regular) , Sweet Tea , Candies,  Chips, Cookies, Artificial Sweeteners,   and "Sugar-free" Products .  This will help patient to have stable blood glucose profile and potentially avoid unintended  Weight gain.  - The patient  has been  scheduled with Jearld Fenton, RDN, CDE for individualized DM education. - I have approached patient with the following individualized plan to manage diabetes and patient agrees.  -I will keep him off of  Metformin  for now due to CKD.  - I will continue Lantus 15 units qhs, monitor before breakfast everyday. Target A1c for him would be between 7 and 7.5%.  - Given his mild hypercalcemia at 5.4, I will prescribed 20 mg of Lasix for him to take daily. He will repeat his CMP in 1 week. If hyperkalemia resolves he will be called back to discontinue Lasix. - Patient specific target  for A1c; LDL, HDL, Triglycerides, and  Waist Circumference were discussed in detail.  2) BP/HTN: Controlled. Continue current medications including ACEI. 3) Lipids/HPL: continue statins. 4)  Weight/Diet: CDE consult in progress, exercise, and carbohydrates information  provided.  5) Chronic Care/Health Maintenance:  -Patient  on ACEI and Statin medications and encouraged to continue to follow up with Ophthalmology, Podiatrist at least yearly or according to recommendations, and advised to  stay away from smoking. I have recommended yearly flu vaccine and pneumonia vaccination at least every 5 years; moderate intensity exercise for up to 150 minutes weekly; and  sleep for at least 7 hours a day.  I advised patient to maintain close follow up with their PCP for primary care needs.  Patient is asked to bring meter and  blood glucose logs during their next visit.   Follow up plan: Return in about 3 months (around 01/04/2017) for follow up with pre-visit labs, meter, and logs.  Glade Lloyd, MD Phone: [phone]  Fax: [phone]   10/04/2016, 1:59 PM

## 2016-10-12 DIAGNOSIS — E1159 Type 2 diabetes mellitus with other circulatory complications: Secondary | ICD-10-CM | POA: Diagnosis not present

## 2016-10-13 LAB — COMPREHENSIVE METABOLIC PANEL
ALBUMIN: 3.9 g/dL (ref 3.6–5.1)
ALK PHOS: 81 U/L (ref 40–115)
ALT: 15 U/L (ref 9–46)
AST: 17 U/L (ref 10–35)
BILIRUBIN TOTAL: 0.7 mg/dL (ref 0.2–1.2)
BUN: 39 mg/dL — ABNORMAL HIGH (ref 7–25)
CO2: 28 mmol/L (ref 20–31)
CREATININE: 1.48 mg/dL — AB (ref 0.70–1.11)
Calcium: 8.6 mg/dL (ref 8.6–10.3)
Chloride: 104 mmol/L (ref 98–110)
GLUCOSE: 105 mg/dL — AB (ref 65–99)
Potassium: 5 mmol/L (ref 3.5–5.3)
SODIUM: 140 mmol/L (ref 135–146)
Total Protein: 6.1 g/dL (ref 6.1–8.1)

## 2016-10-23 ENCOUNTER — Encounter: Payer: Self-pay | Admitting: Internal Medicine

## 2016-10-23 ENCOUNTER — Ambulatory Visit (INDEPENDENT_AMBULATORY_CARE_PROVIDER_SITE_OTHER): Payer: Medicare Other | Admitting: Internal Medicine

## 2016-10-23 ENCOUNTER — Ambulatory Visit: Payer: Medicare Other | Admitting: Internal Medicine

## 2016-10-23 VITALS — BP 129/53 | HR 63 | Temp 97.9°F | Ht 72.0 in | Wt 191.0 lb

## 2016-10-23 DIAGNOSIS — R634 Abnormal weight loss: Secondary | ICD-10-CM

## 2016-10-23 DIAGNOSIS — B351 Tinea unguium: Secondary | ICD-10-CM | POA: Diagnosis not present

## 2016-10-23 DIAGNOSIS — A048 Other specified bacterial intestinal infections: Secondary | ICD-10-CM

## 2016-10-23 DIAGNOSIS — K219 Gastro-esophageal reflux disease without esophagitis: Secondary | ICD-10-CM | POA: Diagnosis not present

## 2016-10-23 DIAGNOSIS — I2510 Atherosclerotic heart disease of native coronary artery without angina pectoris: Secondary | ICD-10-CM

## 2016-10-23 DIAGNOSIS — E1142 Type 2 diabetes mellitus with diabetic polyneuropathy: Secondary | ICD-10-CM | POA: Diagnosis not present

## 2016-10-23 NOTE — Progress Notes (Signed)
Primary Care Physician:  Glo Herring., MD Primary Gastroenterologist:  Dr. Gala Romney  Pre-Procedure History & Physical: HPI:  Grant [name] is a 82 y.o. male here for follow-up of weight loss and GERD. Describes feeling very well these days. Was found to have H pylori gastritis  -  previously treated. Eradication proven by negative H pylori stool antigen test. He has come all pantoprazole  - along the way takes ranitidine daily. Has any typical reflux symptoms;  He does describe 3 episodes of food impactions out at restaurants. Does well at home. Does have an upper denture. (Steakhouse coronary syndrome). He's gained 4 more pounds since he was seen here in July;  he gained 6 pounds from June to July. He feels well. His wife accompanies him today.  Past Medical History:  Diagnosis Date  . Arthritis   . CAD (coronary artery disease)   . Cancer ([place])    skin cancer of forehead  . Diabetes mellitus   . Erectile dysfunction   . GERD (gastroesophageal reflux disease)   . Gout 09-09-13   last flare 8'14 with gallbladder attack at [facility]  . Hyperlipidemia   . Hypertension   . Macular degeneration 09-09-13   bilateral"vision is poor"  . Pancreatitis 10--8-14   8'14 -enzymes improved  . Psoriasis 09-09-13   elbows, knees  . RBBB   . Tubular adenoma     Past Surgical History:  Procedure Laterality Date  . CARDIAC CATHETERIZATION  02/19/2011   3 vessel CAD  . CATARACT EXTRACTION W/PHACO Left 02/14/2016   Procedure: CATARACT EXTRACTION PHACO AND INTRAOCULAR LENS PLACEMENT (IOC);  Surgeon: Rutherford Guys, MD;  Location: [facility];  Service: Ophthalmology;  Laterality: Left;  CDE:6.96  . CATARACT EXTRACTION W/PHACO Right 02/28/2016   Procedure: CATARACT EXTRACTION PHACO AND INTRAOCULAR LENS PLACEMENT (IOC);  Surgeon: Rutherford Guys, MD;  Location: [facility];  Service: Ophthalmology;  Laterality: Right;  CDE:9.54  . CHOLECYSTECTOMY N/A 10/27/2013   Procedure: LAPAROSCOPIC CHOLECYSTECTOMY WITH  INTRAOPERATIVE CHOLANGIOGRAM WITH LIVER BIOPSY;  Surgeon: Pedro Earls, MD;  Location: [facility];  Service: General;  Laterality: N/A;  . COLONOSCOPY  02/25/2012   Procedure: COLONOSCOPY;  Surgeon: Daneil Dolin, MD;  Location: [facility];  Service: Endoscopy;  Laterality: N/A;  7:30 AM polyps removed.  . COLONOSCOPY N/A 02/11/2015   Dr.Creighton Longley- multiple colonic polyps bx= tubular adenoma and an inflammatory polyp  . CORONARY ARTERY BYPASS GRAFT  02-28-2011   LIMA to LAD,SVG to acute marginal branch of RCA & sequential SVG to OM1 & OM2.  . ESOPHAGOGASTRODUODENOSCOPY N/A 05/10/2016   RMR: mild schatzki ring, medium sized hh, H.pylori gastritis  . NM MYOCAR PERF WALL MOTION  02/15/2011   High Risk  . PILONIDAL CYST EXCISION    . US ECHOCARDIOGRAPHY  11/09/2008   mildly impaired diastolic dysfunction - Grade I    Prior to Admission medications   Medication Sig Start Date End Date Taking? Authorizing Provider  allopurinol (ZYLOPRIM) 100 MG tablet Take 100 mg by mouth daily with breakfast.    Yes Historical Provider, MD  aspirin 325 MG tablet Take 325 mg by mouth daily.   Yes Historical Provider, MD  atorvastatin (LIPITOR) 40 MG tablet Take 40 mg by mouth every evening.    Yes Historical Provider, MD  betamethasone dipropionate (DIPROLENE) 0.05 % cream Apply 1 application topically daily as needed (siriasis).  11/22/15  Yes Historical Provider, MD  carvedilol (COREG) 12.5 MG tablet Take 12.5 mg by mouth 2 (two)  times daily with a meal.   Yes Historical Provider, MD  escitalopram (LEXAPRO) 10 MG tablet Take 10 mg by mouth daily.   Yes Historical Provider, MD  furosemide (LASIX) 20 MG tablet TAKE 1 TABLET(20 MG) BY MOUTH DAILY 10/04/16  Yes Cassandria Anger, MD  LANTUS SOLOSTAR 100 UNIT/ML Solostar Pen INJECT 20 UNITS SUBCUTANEOUS AT BEDTIME Patient taking differently: INJECT 15 UNITS SUBCUTANEOUS AT BEDTIME 05/29/16  Yes Cassandria Anger, MD  Misc Natural Products (TART CHERRY ADVANCED PO)  Take 1,200 mg by mouth daily.   Yes Historical Provider, MD  Multiple Vitamin (MULTIVITAMIN) tablet Take 1 tablet by mouth daily.   Yes Historical Provider, MD  Multiple Vitamins-Minerals (PRESERVISION AREDS PO) Take 1 capsule by mouth 2 (two) times daily.    Yes Historical Provider, MD  quinapril (ACCUPRIL) 20 MG tablet Take 20 mg by mouth every morning.    Yes Historical Provider, MD  ranitidine (ZANTAC) 150 MG tablet Take 150 mg by mouth 2 (two) times daily.   Yes Historical Provider, MD  HYDROcodone-acetaminophen (NORCO/VICODIN) 5-325 MG tablet Take one tab po q 4-6 hrs prn pain Patient not taking: Reported on 10/23/2016 07/25/16   Tammy Triplett, PA-C  pantoprazole (PROTONIX) 40 MG tablet TAKE 1 TABLET BY MOUTH TWICE DAILY Patient not taking: Reported on 10/23/2016 05/29/16   Carlis Stable, NP    Allergies as of 10/23/2016 - Review Complete 10/23/2016  Allergen Reaction Noted  . Amiodarone Nausea Only 07/23/2013    Family History  Problem Relation Age of Onset  . Stroke Father   . Cancer Mother     leukemia  . Heart failure Brother   . Cancer Sister     pancreas & liver & liver  . Hypertension Sister   . Cancer Sister     breast  . Heart failure Sister   . Anesthesia problems Neg Hx   . Hypotension Neg Hx   . Malignant hyperthermia Neg Hx   . Pseudochol deficiency Neg Hx   . Colon cancer Neg Hx     Social History   Social History  . Marital status: Married    Spouse name: N/A  . Number of children: N/A  . Years of education: N/A   Occupational History  . Not on file.   Social History Main Topics  . Smoking status: Former Smoker    Packs/day: 0.50    Years: 25.00    Types: Cigarettes    Quit date: 02/25/1983  . Smokeless tobacco: Never Used  . Alcohol use Yes     Comment: occasional  . Drug use: No  . Sexual activity: No   Other Topics Concern  . Not on file   Social History Narrative  . No narrative on file    Review of Systems: See HPI, otherwise  negative ROS  Physical Exam: BP (!) 129/53   Pulse 63   Temp 97.9 F (36.6 C) (Oral)   Ht 6' (1.829 m)   Wt 191 lb (86.6 kg)   BMI 25.90 kg/m  General:   Alert,   pleasant and cooperative in NAD Skin:  Intact without significant lesions or rashes. Eyes:  Sclera clear, no icterus.   Conjunctiva pink. Neck:  Supple; no masses or thyromegaly. No significant cervical adenopathy. Lungs:  Clear throughout to auscultation.   No wheezes, crackles, or rhonchi. No acute distress. Heart:  Regular rate and rhythm; no murmurs, clicks, rubs,  or gallops. Abdomen: Non-distended, normal bowel sounds.  Soft and nontender  without appreciable mass or hepatosplenomegaly.  Pulses:  Normal pulses noted. Extremities:  Without clubbing or edema.  Impression:  Pleasant 82 year old gentleman with recent weight loss which has rebounded nicely. History of H. pylori infection treated with eradication documented. GERD;  needs a daily PPI and not H2-blocker therapy.  Dentures and schatzki's ring likely contributing to relatively infrequent episodes of dysphagia.  No persisting worrisome GI symptoms at this time. Weight gain is most reassuring.  I explained to the patient and his wife he may need his esophagus dilated at some point in the future.   Recommendations:  GERD information reviewed  Resume Protonix 40 mg daily  Use Ranitidine only as needed  Swallowing precautions reviewed.    Office visit in 1 year  If dysphagia worsens in the interim, patient is to let me know.    Notice: This dictation was prepared with Dragon dictation along with smaller phrase technology. Any transcriptional errors that result from this process are unintentional and may not be corrected upon review.

## 2016-10-23 NOTE — Patient Instructions (Addendum)
GERD information reviewed  Resume Protonix 40 mg daily  Use Ranitidine only as needed  Swallowing precautions reviewed  Office visit in 1 year

## 2016-10-31 ENCOUNTER — Telehealth: Payer: Self-pay | Admitting: Internal Medicine

## 2016-10-31 NOTE — Telephone Encounter (Signed)
Pt's wife called to say that RMR had seen patient recently and said that his pharmacy hasn't received the prescription for Ranitidine (as needed). Please advise. [phone]

## 2016-11-01 NOTE — Telephone Encounter (Signed)
yes

## 2016-11-01 NOTE — Telephone Encounter (Signed)
Dr.Rourk, is it ok for [place] to send in rx for ranitidine prn?

## 2016-11-02 MED ORDER — RANITIDINE HCL 150 MG PO TABS: 150.0000 mg | ORAL_TABLET | Freq: Two times a day (BID) | ORAL | 3 refills | Status: DC

## 2016-11-02 NOTE — Addendum Note (Signed)
Addended by: Claudina Lick on: 11/02/2016 11:28 AM   Modules accepted: Orders

## 2016-11-02 NOTE — Telephone Encounter (Signed)
rx has been sent in

## 2016-11-12 ENCOUNTER — Telehealth: Payer: Self-pay

## 2016-11-12 NOTE — Telephone Encounter (Signed)
pts wife called- they need an rx for pantoprazole sent to [facility]. Per RMR last ov note, pt is to resume pantoprazole 40mg  daily.  Dr.Rourk, how many refills do you want me to give this pt?

## 2016-11-12 NOTE — Telephone Encounter (Signed)
Pantoprazole 40 mg daily. Dispense 30;  11 refills.

## 2016-11-13 MED ORDER — PANTOPRAZOLE SODIUM 40 MG PO TBEC: 40.0000 mg | DELAYED_RELEASE_TABLET | Freq: Every day | ORAL | 11 refills | Status: DC

## 2016-11-13 NOTE — Telephone Encounter (Signed)
rx has been sent in

## 2016-12-25 DIAGNOSIS — I10 Essential (primary) hypertension: Secondary | ICD-10-CM | POA: Diagnosis not present

## 2016-12-25 DIAGNOSIS — Z1389 Encounter for screening for other disorder: Secondary | ICD-10-CM | POA: Diagnosis not present

## 2016-12-25 DIAGNOSIS — Z6828 Body mass index (BMI) 28.0-28.9, adult: Secondary | ICD-10-CM | POA: Diagnosis not present

## 2016-12-25 DIAGNOSIS — E119 Type 2 diabetes mellitus without complications: Secondary | ICD-10-CM | POA: Diagnosis not present

## 2016-12-25 DIAGNOSIS — I2510 Atherosclerotic heart disease of native coronary artery without angina pectoris: Secondary | ICD-10-CM | POA: Diagnosis not present

## 2016-12-25 DIAGNOSIS — R201 Hypoesthesia of skin: Secondary | ICD-10-CM | POA: Diagnosis not present

## 2016-12-25 DIAGNOSIS — E1129 Type 2 diabetes mellitus with other diabetic kidney complication: Secondary | ICD-10-CM | POA: Diagnosis not present

## 2017-01-01 ENCOUNTER — Other Ambulatory Visit: Payer: Self-pay | Admitting: "Endocrinology

## 2017-01-01 DIAGNOSIS — B351 Tinea unguium: Secondary | ICD-10-CM | POA: Diagnosis not present

## 2017-01-01 DIAGNOSIS — E1142 Type 2 diabetes mellitus with diabetic polyneuropathy: Secondary | ICD-10-CM | POA: Diagnosis not present

## 2017-01-01 DIAGNOSIS — E1159 Type 2 diabetes mellitus with other circulatory complications: Secondary | ICD-10-CM | POA: Diagnosis not present

## 2017-01-01 LAB — COMPREHENSIVE METABOLIC PANEL
ALBUMIN: 3.8 g/dL (ref 3.6–5.1)
ALT: 15 U/L (ref 9–46)
AST: 16 U/L (ref 10–35)
Alkaline Phosphatase: 79 U/L (ref 40–115)
BUN: 25 mg/dL (ref 7–25)
CHLORIDE: 106 mmol/L (ref 98–110)
CO2: 30 mmol/L (ref 20–31)
CREATININE: 1.21 mg/dL — AB (ref 0.70–1.11)
Calcium: 9 mg/dL (ref 8.6–10.3)
Glucose, Bld: 102 mg/dL — ABNORMAL HIGH (ref 65–99)
POTASSIUM: 4.6 mmol/L (ref 3.5–5.3)
Sodium: 141 mmol/L (ref 135–146)
TOTAL PROTEIN: 5.9 g/dL — AB (ref 6.1–8.1)
Total Bilirubin: 0.8 mg/dL (ref 0.2–1.2)

## 2017-01-02 LAB — HEMOGLOBIN A1C
Hgb A1c MFr Bld: 7.2 % — ABNORMAL HIGH (ref <5.7)
MEAN PLASMA GLUCOSE: 160 mg/dL

## 2017-01-09 ENCOUNTER — Ambulatory Visit (INDEPENDENT_AMBULATORY_CARE_PROVIDER_SITE_OTHER): Payer: Medicare Other | Admitting: "Endocrinology

## 2017-01-09 ENCOUNTER — Encounter: Payer: Self-pay | Admitting: "Endocrinology

## 2017-01-09 VITALS — BP 138/67 | HR 59 | Ht 72.0 in | Wt 195.0 lb

## 2017-01-09 DIAGNOSIS — E1159 Type 2 diabetes mellitus with other circulatory complications: Secondary | ICD-10-CM | POA: Diagnosis not present

## 2017-01-09 DIAGNOSIS — I10 Essential (primary) hypertension: Secondary | ICD-10-CM

## 2017-01-09 DIAGNOSIS — E782 Mixed hyperlipidemia: Secondary | ICD-10-CM | POA: Diagnosis not present

## 2017-01-09 NOTE — Progress Notes (Signed)
Subjective:    Patient ID: Grant [name], male    DOB: Apr 24, 1934,    Past Medical History:  Diagnosis Date  . Arthritis   . CAD (coronary artery disease)   . Cancer ([place])    skin cancer of forehead  . Diabetes mellitus   . Erectile dysfunction   . GERD (gastroesophageal reflux disease)   . Gout 09-09-13   last flare 8'14 with gallbladder attack at [facility]  . Hyperlipidemia   . Hypertension   . Macular degeneration 09-09-13   bilateral"vision is poor"  . Pancreatitis 10--8-14   8'14 -enzymes improved  . Psoriasis 09-09-13   elbows, knees  . RBBB   . Tubular adenoma    Past Surgical History:  Procedure Laterality Date  . CARDIAC CATHETERIZATION  02/19/2011   3 vessel CAD  . CATARACT EXTRACTION W/PHACO Left 02/14/2016   Procedure: CATARACT EXTRACTION PHACO AND INTRAOCULAR LENS PLACEMENT (IOC);  Surgeon: Rutherford Guys, MD;  Location: [facility];  Service: Ophthalmology;  Laterality: Left;  CDE:6.96  . CATARACT EXTRACTION W/PHACO Right 02/28/2016   Procedure: CATARACT EXTRACTION PHACO AND INTRAOCULAR LENS PLACEMENT (IOC);  Surgeon: Rutherford Guys, MD;  Location: [facility];  Service: Ophthalmology;  Laterality: Right;  CDE:9.54  . CHOLECYSTECTOMY N/A 10/27/2013   Procedure: LAPAROSCOPIC CHOLECYSTECTOMY WITH INTRAOPERATIVE CHOLANGIOGRAM WITH LIVER BIOPSY;  Surgeon: Pedro Earls, MD;  Location: [facility];  Service: General;  Laterality: N/A;  . COLONOSCOPY  02/25/2012   Procedure: COLONOSCOPY;  Surgeon: Daneil Dolin, MD;  [facility];  Service: Endoscopy;  Laterality: N/A;  7:30 AM polyps removed.  . COLONOSCOPY N/A 02/11/2015   Dr.Rourk- multiple colonic polyps bx= tubular adenoma and an inflammatory polyp  . CORONARY ARTERY BYPASS GRAFT  02-28-2011   LIMA to LAD,SVG to acute marginal branch of RCA & sequential SVG to OM1 & OM2.  . ESOPHAGOGASTRODUODENOSCOPY N/A 05/10/2016   RMR: mild schatzki ring, medium sized hh, H.pylori gastritis  . NM MYOCAR PERF WALL MOTION  02/15/2011    High Risk  . PILONIDAL CYST EXCISION    . US ECHOCARDIOGRAPHY  11/09/2008   mildly impaired diastolic dysfunction - Grade I   Social History   Social History  . Marital status: Married    Spouse name: N/A  . Number of children: N/A  . Years of education: N/A   Social History Main Topics  . Smoking status: Former Smoker    Packs/day: 0.50    Years: 25.00    Types: Cigarettes    Quit date: 02/25/1983  . Smokeless tobacco: Never Used  . Alcohol use Yes     Comment: occasional  . Drug use: No  . Sexual activity: No   Other Topics Concern  . None   Social History Narrative  . None   Outpatient Encounter Prescriptions as of 01/09/2017  Medication Sig  . allopurinol (ZYLOPRIM) 100 MG tablet Take 100 mg by mouth daily with breakfast.   . aspirin 325 MG tablet Take 325 mg by mouth daily.  Marland Kitchen atorvastatin (LIPITOR) 40 MG tablet Take 40 mg by mouth every evening.   . betamethasone dipropionate (DIPROLENE) 0.05 % cream Apply 1 application topically daily as needed (siriasis).   . carvedilol (COREG) 12.5 MG tablet Take 12.5 mg by mouth 2 (two) times daily with a meal.  . escitalopram (LEXAPRO) 10 MG tablet Take 10 mg by mouth daily.  Marland Kitchen LANTUS SOLOSTAR 100 UNIT/ML Solostar Pen INJECT 20 UNITS SUBCUTANEOUS AT BEDTIME (Patient taking differently: INJECT  15 UNITS SUBCUTANEOUS AT BEDTIME)  . Misc Natural Products (TART CHERRY ADVANCED PO) Take 1,200 mg by mouth daily.  . Multiple Vitamin (MULTIVITAMIN) tablet Take 1 tablet by mouth daily.  . Multiple Vitamins-Minerals (PRESERVISION AREDS PO) Take 1 capsule by mouth 2 (two) times daily.   . pantoprazole (PROTONIX) 40 MG tablet Take 1 tablet (40 mg total) by mouth daily.  . quinapril (ACCUPRIL) 20 MG tablet Take 20 mg by mouth every morning.   . ranitidine (ZANTAC) 150 MG tablet Take 1 tablet (150 mg total) by mouth 2 (two) times daily.  . [DISCONTINUED] furosemide (LASIX) 20 MG tablet TAKE 1 TABLET(20 MG) BY MOUTH DAILY  . [DISCONTINUED]  HYDROcodone-acetaminophen (NORCO/VICODIN) 5-325 MG tablet Take one tab po q 4-6 hrs prn pain (Patient not taking: Reported on 10/23/2016)  . [DISCONTINUED] pantoprazole (PROTONIX) 40 MG tablet TAKE 1 TABLET BY MOUTH TWICE DAILY (Patient not taking: Reported on 10/23/2016)   No facility-administered encounter medications on file as of 01/09/2017.    ALLERGIES: Allergies  Allergen Reactions  . Amiodarone Nausea Only   VACCINATION STATUS: Immunization History  Administered Date(s) Administered  . Influenza-Unspecified 09/02/2013    Diabetes  He presents for his follow-up diabetic visit. He has type 2 diabetes mellitus. Onset time: He was diagnosed at approximate age of 41 years. His disease course has been stable. There are no hypoglycemic associated symptoms. Pertinent negatives for hypoglycemia include no confusion, headaches, pallor or seizures. There are no diabetic associated symptoms. Pertinent negatives for diabetes include no chest pain, no fatigue, no polydipsia, no polyphagia, no polyuria and no weakness. There are no hypoglycemic complications. Symptoms are stable. Diabetic complications include heart disease. Risk factors for coronary artery disease include dyslipidemia, diabetes mellitus, hypertension and tobacco exposure. He is compliant with treatment most of the time. His weight is increasing steadily. He is following a generally healthy diet. He participates in exercise intermittently. His home blood glucose trend is decreasing steadily. His breakfast blood glucose range is generally 130-140 mg/dl. His overall blood glucose range is 130-140 mg/dl. Eye exam is current.  Hypertension  This is a chronic problem. The current episode started more than 1 year ago. Pertinent negatives include no chest pain, headaches, neck pain, palpitations or shortness of breath. Hypertensive end-organ damage includes CAD/MI.  Hyperlipidemia  This is a chronic problem. The current episode started more  than 1 year ago. The problem is controlled. Pertinent negatives include no chest pain, myalgias or shortness of breath.     Review of Systems  Constitutional: Positive for unexpected weight change. Negative for fatigue.  HENT: Negative for dental problem, mouth sores and trouble swallowing.   Eyes: Negative for visual disturbance.  Respiratory: Negative for cough, choking, chest tightness, shortness of breath and wheezing.   Cardiovascular: Negative for chest pain, palpitations and leg swelling.  Gastrointestinal: Negative for abdominal distention, abdominal pain, constipation, diarrhea, nausea and vomiting.  Endocrine: Negative for polydipsia, polyphagia and polyuria.  Genitourinary: Negative for dysuria, flank pain, hematuria and urgency.  Musculoskeletal: Negative for back pain, gait problem, myalgias and neck pain.  Skin: Negative for pallor, rash and wound.  Neurological: Negative for seizures, syncope, weakness, numbness and headaches.  Psychiatric/Behavioral: Negative.  Negative for confusion and dysphoric mood.    Objective:    BP 138/67   Pulse (!) 59   Ht 6' (1.829 m)   Wt 195 lb (88.5 kg)   BMI 26.45 kg/m   Wt Readings from Last 3 Encounters:  01/09/17 195 lb (88.5  kg)  10/23/16 191 lb (86.6 kg)  10/04/16 190 lb (86.2 kg)    Physical Exam  Constitutional: He is oriented to person, place, and time. He appears well-developed and well-nourished. He is cooperative. No distress.  HENT:  Head: Normocephalic and atraumatic.  Eyes: EOM are normal.  Neck: Normal range of motion. Neck supple. No tracheal deviation present. No thyromegaly present.  Cardiovascular: Normal rate, S1 normal, S2 normal and normal heart sounds.  Exam reveals no gallop.   No murmur heard. Pulses:      Dorsalis pedis pulses are 1+ on the right side, and 1+ on the left side.       Posterior tibial pulses are 1+ on the right side, and 1+ on the left side.  Pulmonary/Chest: Breath sounds normal. No  respiratory distress. He has no wheezes.  Abdominal: Soft. Bowel sounds are normal. He exhibits no distension. There is no tenderness. There is no guarding and no CVA tenderness.  Musculoskeletal: He exhibits no edema.       Right shoulder: He exhibits no swelling and no deformity.  Neurological: He is alert and oriented to person, place, and time. He has normal strength and normal reflexes. No cranial nerve deficit or sensory deficit. Gait normal.  Skin: Skin is warm and dry. No rash noted. No cyanosis. Nails show no clubbing.  Psychiatric: He has a normal mood and affect. His speech is normal and behavior is normal. Judgment and thought content normal. Cognition and memory are normal.    Results for orders placed or performed in visit on 01/01/17  Comprehensive metabolic panel  Result Value Ref Range   Sodium 141 135 - 146 mmol/L   Potassium 4.6 3.5 - 5.3 mmol/L   Chloride 106 98 - 110 mmol/L   CO2 30 20 - 31 mmol/L   Glucose, Bld 102 (H) 65 - 99 mg/dL   BUN 25 7 - 25 mg/dL   Creat 1.21 (H) 0.70 - 1.11 mg/dL   Total Bilirubin 0.8 0.2 - 1.2 mg/dL   Alkaline Phosphatase 79 40 - 115 U/L   AST 16 10 - 35 U/L   ALT 15 9 - 46 U/L   Total Protein 5.9 (L) 6.1 - 8.1 g/dL   Albumin 3.8 3.6 - 5.1 g/dL   Calcium 9.0 8.6 - 10.3 mg/dL  Hemoglobin A1c  Result Value Ref Range   Hgb A1c MFr Bld 7.2 (H) <5.7 %   Mean Plasma Glucose 160 mg/dL   Complete Blood Count (Most recent): Lab Results  Component Value Date   WBC 5.5 05/04/2016   HGB 10.7 (L) 05/04/2016   HCT 32.5 (L) 05/04/2016   MCV 92.1 05/04/2016   PLT 154 05/04/2016   Chemistry (most recent): Lab Results  Component Value Date   NA 141 01/01/2017   K 4.6 01/01/2017   CL 106 01/01/2017   CO2 30 01/01/2017   BUN 25 01/01/2017   CREATININE 1.21 (H) 01/01/2017   Diabetic Labs (most recent): Lab Results  Component Value Date   HGBA1C 7.2 (H) 01/01/2017   HGBA1C 6.8 (H) 09/28/2016   HGBA1C 7.2 (H) 06/20/2016      Assessment & Plan:   1. Type 2 diabetes mellitus with vascular disease ([place])  his diabetes is  complicated by coronary artery disease. Patient came with stable glucose profile, and  recent A1c stable at 7.2%.  Glucose logs and insulin administration records pertaining to this visit,  to be scanned into patient's records.    Recent labs  reviewed, His creatinine stable at 1.21 GFR of 50. - Patient remains at a high risk for more acute and chronic complications of diabetes which include CAD, CVA, CKD, retinopathy, and neuropathy. These are all discussed in detail with the patient.  - I have re-counseled the patient on diet management and   by adopting a carbohydrate restricted / protein rich  Diet. - Patient is advised to stick to a routine mealtimes to eat 3 meals  a day and avoid unnecessary snacks ( to snack only to correct hypoglycemia).  - Suggestion is made for patient to avoid simple carbohydrates   from their diet including Cakes , Desserts, Ice Cream,  Soda (  diet and regular) , Sweet Tea , Candies,  Chips, Cookies, Artificial Sweeteners,   and "Sugar-free" Products .  This will help patient to have stable blood glucose profile and potentially avoid unintended  Weight gain.  - The patient  has been  scheduled with Jearld Fenton, RDN, CDE for individualized DM education. - I have approached patient with the following individualized plan to manage diabetes and patient agrees.  -I will keep him off of  Metformin  for now due to CKD.  - I will continue Lantus 15 units qhs, monitor before breakfast everyday. Target A1c for him would be between 7 and 7.5%.  - Hyperkalemia has resolved to 4.6 after a months of therapy with 20 mg of Lasix. He is advised to stay off of Lasix for now. - Patient specific target  for A1c; LDL, HDL, Triglycerides, and  Waist Circumference were discussed in detail.  2) BP/HTN: Controlled. Continue current medications including ACEI. 3) Lipids/HPL: continue  statins. 4)  Weight/Diet: CDE consult in progress, exercise, and carbohydrates information provided.  5) Chronic Care/Health Maintenance:  -Patient  on ACEI and Statin medications and encouraged to continue to follow up with Ophthalmology, Podiatrist at least yearly or according to recommendations, and advised to  stay away from smoking. I have recommended yearly flu vaccine and pneumonia vaccination at least every 5 years; moderate intensity exercise for up to 150 minutes weekly; and  sleep for at least 7 hours a day.  I advised patient to maintain close follow up with their PCP for primary care needs.  Patient is asked to bring meter and  blood glucose logs during their next visit.   Follow up plan: Return in about 3 months (around 04/08/2017) for meter, and logs.  Glade Lloyd, MD Phone: [phone]  Fax: [phone]   01/09/2017, 2:06 PM

## 2017-01-23 DIAGNOSIS — E1122 Type 2 diabetes mellitus with diabetic chronic kidney disease: Secondary | ICD-10-CM | POA: Diagnosis not present

## 2017-01-23 DIAGNOSIS — E875 Hyperkalemia: Secondary | ICD-10-CM | POA: Diagnosis not present

## 2017-01-23 DIAGNOSIS — M109 Gout, unspecified: Secondary | ICD-10-CM | POA: Diagnosis not present

## 2017-01-23 DIAGNOSIS — N183 Chronic kidney disease, stage 3 (moderate): Secondary | ICD-10-CM | POA: Diagnosis not present

## 2017-01-23 DIAGNOSIS — N2581 Secondary hyperparathyroidism of renal origin: Secondary | ICD-10-CM | POA: Diagnosis not present

## 2017-01-23 DIAGNOSIS — I129 Hypertensive chronic kidney disease with stage 1 through stage 4 chronic kidney disease, or unspecified chronic kidney disease: Secondary | ICD-10-CM | POA: Diagnosis not present

## 2017-02-14 DIAGNOSIS — Z961 Presence of intraocular lens: Secondary | ICD-10-CM | POA: Diagnosis not present

## 2017-02-14 DIAGNOSIS — H43813 Vitreous degeneration, bilateral: Secondary | ICD-10-CM | POA: Diagnosis not present

## 2017-02-14 DIAGNOSIS — H353134 Nonexudative age-related macular degeneration, bilateral, advanced atrophic with subfoveal involvement: Secondary | ICD-10-CM | POA: Diagnosis not present

## 2017-02-14 DIAGNOSIS — Z794 Long term (current) use of insulin: Secondary | ICD-10-CM | POA: Diagnosis not present

## 2017-02-14 DIAGNOSIS — E119 Type 2 diabetes mellitus without complications: Secondary | ICD-10-CM | POA: Diagnosis not present

## 2017-03-19 DIAGNOSIS — B351 Tinea unguium: Secondary | ICD-10-CM | POA: Diagnosis not present

## 2017-03-19 DIAGNOSIS — E1142 Type 2 diabetes mellitus with diabetic polyneuropathy: Secondary | ICD-10-CM | POA: Diagnosis not present

## 2017-04-01 IMAGING — DX DG WRIST COMPLETE 3+V*R*
4 series · 4 of 4 positions shown · non-contrast
Comparison: None.

CLINICAL DATA: Fell while holding a broom.  Pain and swelling.

EXAM:
RIGHT WRIST - COMPLETE 3+ VIEW; RIGHT HAND - COMPLETE 3+ VIEW

[wrist pa]
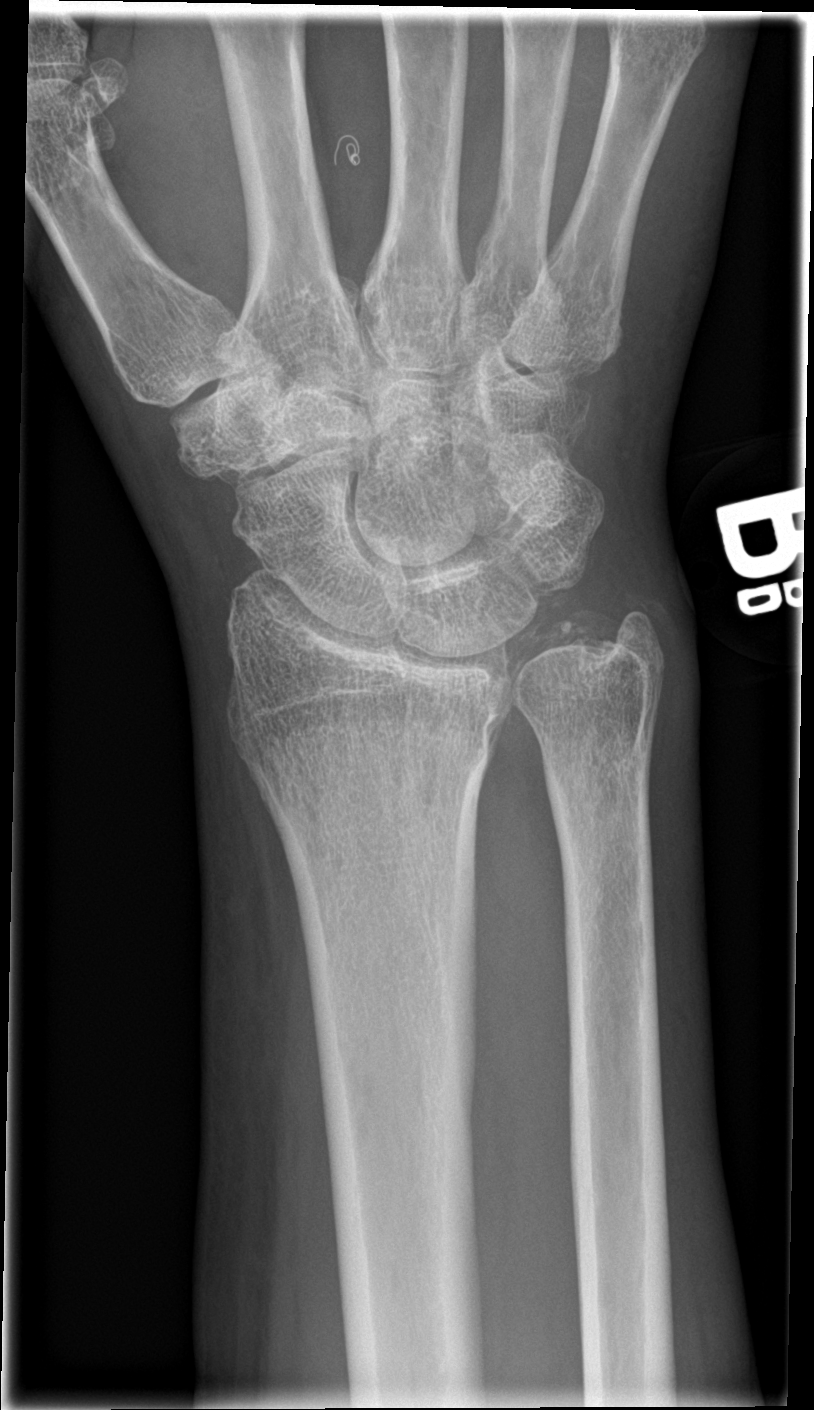

[wrist obl]
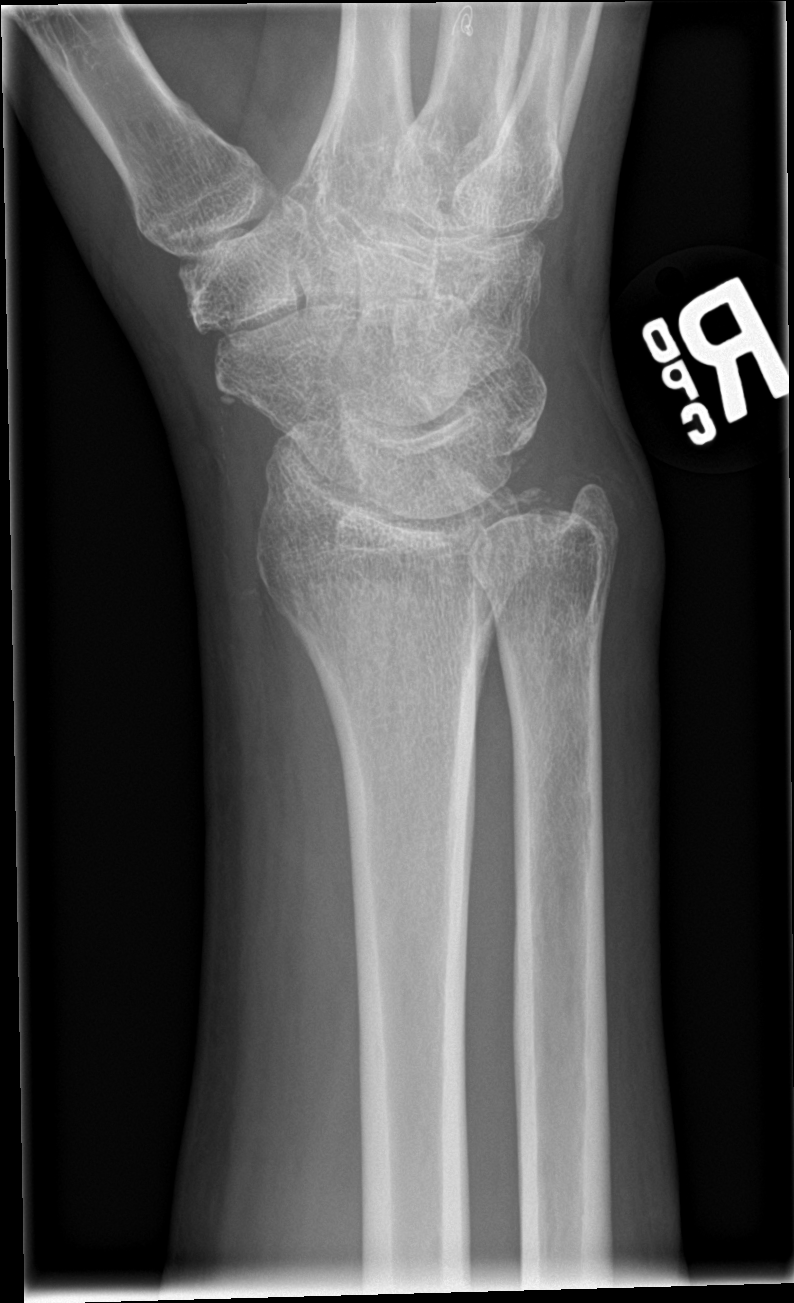

[wrist lat]
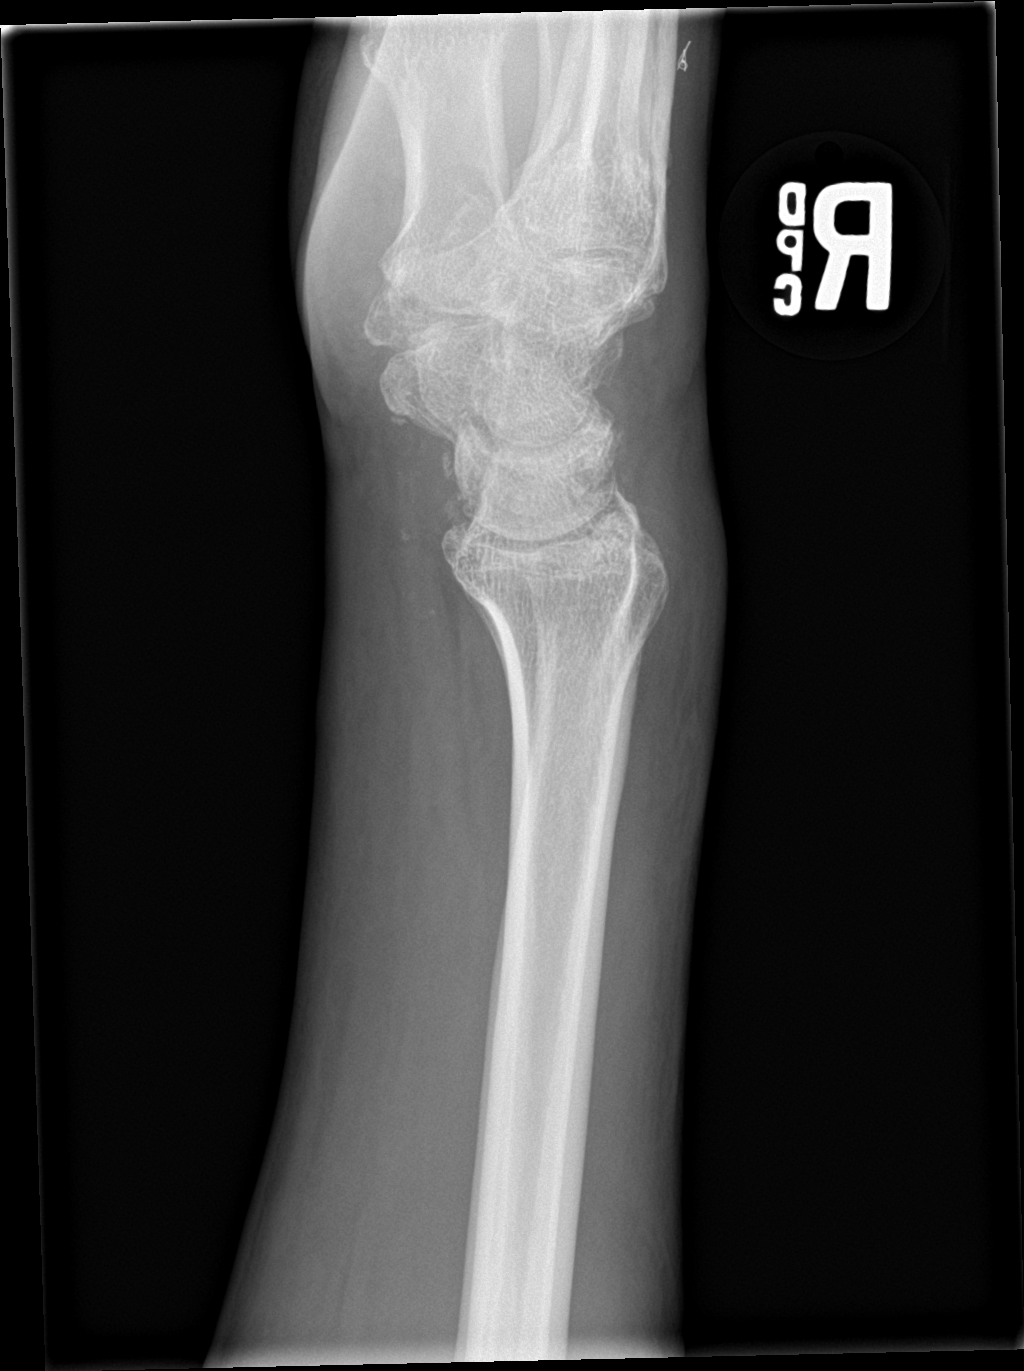

[wrist navicular]
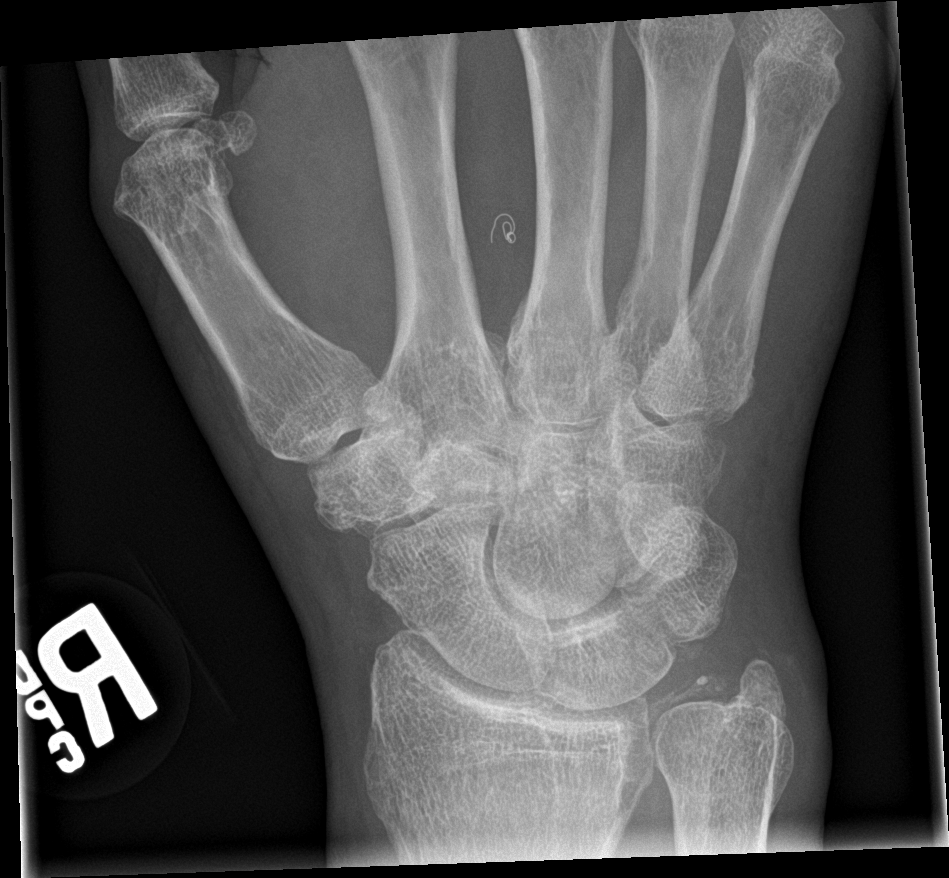

[4 of 4 positions shown; findings below may reference images not displayed]

FINDINGS: No acute fracture deformity or dislocation. Joint space intact
without erosions. No destructive bony lesions. Soft tissue planes
are not suspicious. Mild fluffy intra-articular calcifications at
the wrist most compatible with CPPD. Mild vascular calcifications.
Radiopaque suture material within dorsum of the hand.
IMPRESSION: No acute fracture deformity nor dislocation.

## 2017-04-09 ENCOUNTER — Other Ambulatory Visit: Payer: Self-pay | Admitting: "Endocrinology

## 2017-04-09 DIAGNOSIS — E1159 Type 2 diabetes mellitus with other circulatory complications: Secondary | ICD-10-CM | POA: Diagnosis not present

## 2017-04-09 DIAGNOSIS — E119 Type 2 diabetes mellitus without complications: Secondary | ICD-10-CM | POA: Diagnosis not present

## 2017-04-09 DIAGNOSIS — E782 Mixed hyperlipidemia: Secondary | ICD-10-CM | POA: Diagnosis not present

## 2017-04-09 LAB — LIPID PANEL
CHOLESTEROL: 133 mg/dL (ref <200)
HDL: 44 mg/dL (ref >40)
LDL Cholesterol: 74 mg/dL (ref <100)
Total CHOL/HDL Ratio: 3 Ratio (ref <5.0)
Triglycerides: 75 mg/dL (ref <150)
VLDL: 15 mg/dL (ref <30)

## 2017-04-09 LAB — COMPREHENSIVE METABOLIC PANEL
ALK PHOS: 85 U/L (ref 40–115)
ALT: 16 U/L (ref 9–46)
AST: 20 U/L (ref 10–35)
Albumin: 3.9 g/dL (ref 3.6–5.1)
BUN: 28 mg/dL — AB (ref 7–25)
CHLORIDE: 109 mmol/L (ref 98–110)
CO2: 29 mmol/L (ref 20–31)
Calcium: 8.8 mg/dL (ref 8.6–10.3)
Creat: 1.32 mg/dL — ABNORMAL HIGH (ref 0.70–1.11)
Glucose, Bld: 123 mg/dL — ABNORMAL HIGH (ref 65–99)
Potassium: 5 mmol/L (ref 3.5–5.3)
Sodium: 143 mmol/L (ref 135–146)
TOTAL PROTEIN: 5.8 g/dL — AB (ref 6.1–8.1)
Total Bilirubin: 0.7 mg/dL (ref 0.2–1.2)

## 2017-04-09 LAB — MICROALBUMIN / CREATININE URINE RATIO
Creatinine, Urine: 121 mg/dL (ref 20–370)
MICROALB/CREAT RATIO: 6 ug/mg{creat} (ref <30)
Microalb, Ur: 0.7 mg/dL

## 2017-04-10 LAB — HEMOGLOBIN A1C
Hgb A1c MFr Bld: 8.1 % — ABNORMAL HIGH (ref <5.7)
Mean Plasma Glucose: 186 mg/dL

## 2017-04-11 ENCOUNTER — Ambulatory Visit: Payer: Medicare Other | Admitting: "Endocrinology

## 2017-04-17 ENCOUNTER — Other Ambulatory Visit: Payer: Self-pay | Admitting: "Endocrinology

## 2017-04-22 ENCOUNTER — Encounter: Payer: Self-pay | Admitting: "Endocrinology

## 2017-04-22 ENCOUNTER — Ambulatory Visit (INDEPENDENT_AMBULATORY_CARE_PROVIDER_SITE_OTHER): Payer: Medicare Other | Admitting: "Endocrinology

## 2017-04-22 VITALS — BP 125/67 | HR 54 | Ht 72.0 in | Wt 194.0 lb

## 2017-04-22 DIAGNOSIS — N183 Chronic kidney disease, stage 3 (moderate): Secondary | ICD-10-CM

## 2017-04-22 DIAGNOSIS — I10 Essential (primary) hypertension: Secondary | ICD-10-CM

## 2017-04-22 DIAGNOSIS — E1159 Type 2 diabetes mellitus with other circulatory complications: Secondary | ICD-10-CM | POA: Diagnosis not present

## 2017-04-22 DIAGNOSIS — E782 Mixed hyperlipidemia: Secondary | ICD-10-CM

## 2017-04-22 DIAGNOSIS — Z794 Long term (current) use of insulin: Secondary | ICD-10-CM

## 2017-04-22 DIAGNOSIS — E1122 Type 2 diabetes mellitus with diabetic chronic kidney disease: Secondary | ICD-10-CM | POA: Diagnosis not present

## 2017-04-22 MED ORDER — INSULIN GLARGINE 100 UNIT/ML SOLOSTAR PEN: 20.0000 [IU] | PEN_INJECTOR | Freq: Every day | SUBCUTANEOUS | 1 refills | Status: DC

## 2017-04-22 NOTE — Patient Instructions (Signed)

## 2017-04-22 NOTE — Progress Notes (Signed)
Subjective:    Patient ID: Grant [name], male    DOB: 01-27-1934,    Past Medical History:  Diagnosis Date  . Arthritis   . CAD (coronary artery disease)   . Cancer ([place])    skin cancer of forehead  . Diabetes mellitus   . Erectile dysfunction   . GERD (gastroesophageal reflux disease)   . Gout 09-09-13   last flare 8'14 with gallbladder attack at [facility]  . Hyperlipidemia   . Hypertension   . Macular degeneration 09-09-13   bilateral"vision is poor"  . Pancreatitis 10--8-14   8'14 -enzymes improved  . Psoriasis 09-09-13   elbows, knees  . RBBB   . Tubular adenoma    Past Surgical History:  Procedure Laterality Date  . CARDIAC CATHETERIZATION  02/19/2011   3 vessel CAD  . CATARACT EXTRACTION W/PHACO Left 02/14/2016   Procedure: CATARACT EXTRACTION PHACO AND INTRAOCULAR LENS PLACEMENT (IOC);  Surgeon: Rutherford Guys, MD;  Location: [facility];  Service: Ophthalmology;  Laterality: Left;  CDE:6.96  . CATARACT EXTRACTION W/PHACO Right 02/28/2016   Procedure: CATARACT EXTRACTION PHACO AND INTRAOCULAR LENS PLACEMENT (IOC);  Surgeon: Rutherford Guys, MD;  Location: [facility];  Service: Ophthalmology;  Laterality: Right;  CDE:9.54  . CHOLECYSTECTOMY N/A 10/27/2013   Procedure: LAPAROSCOPIC CHOLECYSTECTOMY WITH INTRAOPERATIVE CHOLANGIOGRAM WITH LIVER BIOPSY;  Surgeon: Pedro Earls, MD;  Location: [facility];  Service: General;  Laterality: N/A;  . COLONOSCOPY  02/25/2012   Procedure: COLONOSCOPY;  Surgeon: Daneil Dolin, MD;  Location: [facility];  Service: Endoscopy;  Laterality: N/A;  7:30 AM polyps removed.  . COLONOSCOPY N/A 02/11/2015   Dr.Rourk- multiple colonic polyps bx= tubular adenoma and an inflammatory polyp  . CORONARY ARTERY BYPASS GRAFT  02-28-2011   LIMA to LAD,SVG to acute marginal branch of RCA & sequential SVG to OM1 & OM2.  . ESOPHAGOGASTRODUODENOSCOPY N/A 05/10/2016   RMR: mild schatzki ring, medium sized hh, H.pylori gastritis  . NM MYOCAR PERF WALL MOTION  02/15/2011   High Risk  . PILONIDAL CYST EXCISION    . US ECHOCARDIOGRAPHY  11/09/2008   mildly impaired diastolic dysfunction - Grade I   Social History   Social History  . Marital status: Married    Spouse name: N/A  . Number of children: N/A  . Years of education: N/A   Social History Main Topics  . Smoking status: Former Smoker    Packs/day: 0.50    Years: 25.00    Types: Cigarettes    Quit date: 02/25/1983  . Smokeless tobacco: Never Used  . Alcohol use Yes     Comment: occasional  . Drug use: No  . Sexual activity: No   Other Topics Concern  . None   Social History Narrative  . None   Outpatient Encounter Prescriptions as of 04/22/2017  Medication Sig  . allopurinol (ZYLOPRIM) 100 MG tablet Take 100 mg by mouth daily with breakfast.   . aspirin 325 MG tablet Take 325 mg by mouth daily.  Marland Kitchen atorvastatin (LIPITOR) 40 MG tablet Take 40 mg by mouth every evening.   . betamethasone dipropionate (DIPROLENE) 0.05 % cream Apply 1 application topically daily as needed (siriasis).   . carvedilol (COREG) 12.5 MG tablet Take 12.5 mg by mouth 2 (two) times daily with a meal.  . escitalopram (LEXAPRO) 10 MG tablet Take 10 mg by mouth daily.  . Insulin Glargine (LANTUS SOLOSTAR) 100 UNIT/ML Solostar Pen Inject 20 Units into the skin daily at 10  pm.  . Misc Natural Products (TART CHERRY ADVANCED PO) Take 1,200 mg by mouth daily.  . Multiple Vitamin (MULTIVITAMIN) tablet Take 1 tablet by mouth daily.  . Multiple Vitamins-Minerals (PRESERVISION AREDS PO) Take 1 capsule by mouth 2 (two) times daily.   . pantoprazole (PROTONIX) 40 MG tablet Take 1 tablet (40 mg total) by mouth daily.  . quinapril (ACCUPRIL) 20 MG tablet Take 20 mg by mouth every morning.   . ranitidine (ZANTAC) 150 MG tablet Take 1 tablet (150 mg total) by mouth 2 (two) times daily.  . [DISCONTINUED] LANTUS SOLOSTAR 100 UNIT/ML Solostar Pen INJECT 20 UNITS SUBCUTANEOUS AT BEDTIME (Patient taking differently: INJECT 15 UNITS  SUBCUTANEOUS AT BEDTIME)   No facility-administered encounter medications on file as of 04/22/2017.    ALLERGIES: Allergies  Allergen Reactions  . Amiodarone Nausea Only   VACCINATION STATUS: Immunization History  Administered Date(s) Administered  . Influenza-Unspecified 09/02/2013    Diabetes  He presents for his follow-up diabetic visit. He has type 2 diabetes mellitus. Onset time: He was diagnosed at approximate age of 7 years. His disease course has been stable. There are no hypoglycemic associated symptoms. Pertinent negatives for hypoglycemia include no confusion, headaches, pallor or seizures. There are no diabetic associated symptoms. Pertinent negatives for diabetes include no chest pain, no fatigue, no polydipsia, no polyphagia, no polyuria and no weakness. There are no hypoglycemic complications. Symptoms are stable. Diabetic complications include heart disease. Risk factors for coronary artery disease include dyslipidemia, diabetes mellitus, hypertension and tobacco exposure. He is compliant with treatment most of the time. His weight is increasing steadily. He is following a generally healthy diet. He participates in exercise intermittently. His home blood glucose trend is decreasing steadily. His breakfast blood glucose range is generally 130-140 mg/dl. His overall blood glucose range is 130-140 mg/dl. Eye exam is current.  Hypertension  This is a chronic problem. The current episode started more than 1 year ago. Pertinent negatives include no chest pain, headaches, neck pain, palpitations or shortness of breath. Hypertensive end-organ damage includes CAD/MI.  Hyperlipidemia  This is a chronic problem. The current episode started more than 1 year ago. The problem is controlled. Pertinent negatives include no chest pain, myalgias or shortness of breath.     Review of Systems  Constitutional: Positive for unexpected weight change. Negative for fatigue.  HENT: Negative for  dental problem, mouth sores and trouble swallowing.   Eyes: Negative for visual disturbance.  Respiratory: Negative for cough, choking, chest tightness, shortness of breath and wheezing.   Cardiovascular: Negative for chest pain, palpitations and leg swelling.  Gastrointestinal: Negative for abdominal distention, abdominal pain, constipation, diarrhea, nausea and vomiting.  Endocrine: Negative for polydipsia, polyphagia and polyuria.  Genitourinary: Negative for dysuria, flank pain, hematuria and urgency.  Musculoskeletal: Negative for back pain, gait problem, myalgias and neck pain.  Skin: Negative for pallor, rash and wound.  Neurological: Negative for seizures, syncope, weakness, numbness and headaches.  Psychiatric/Behavioral: Negative.  Negative for confusion and dysphoric mood.    Objective:    BP 125/67   Pulse (!) 54   Ht 6' (1.829 m)   Wt 194 lb (88 kg)   BMI 26.31 kg/m   Wt Readings from Last 3 Encounters:  04/22/17 194 lb (88 kg)  01/09/17 195 lb (88.5 kg)  10/23/16 191 lb (86.6 kg)    Physical Exam  Constitutional: He is oriented to person, place, and time. He appears well-developed and well-nourished. He is cooperative. No distress.  HENT:  Head: Normocephalic and atraumatic.  Eyes: EOM are normal.  Neck: Normal range of motion. Neck supple. No tracheal deviation present. No thyromegaly present.  Cardiovascular: Normal rate, S1 normal, S2 normal and normal heart sounds.  Exam reveals no gallop.   No murmur heard. Pulses:      Dorsalis pedis pulses are 1+ on the right side, and 1+ on the left side.       Posterior tibial pulses are 1+ on the right side, and 1+ on the left side.  Pulmonary/Chest: Breath sounds normal. No respiratory distress. He has no wheezes.  Abdominal: Soft. Bowel sounds are normal. He exhibits no distension. There is no tenderness. There is no guarding and no CVA tenderness.  Musculoskeletal: He exhibits no edema.       Right shoulder: He  exhibits no swelling and no deformity.  Neurological: He is alert and oriented to person, place, and time. He has normal strength and normal reflexes. No cranial nerve deficit or sensory deficit. Gait normal.  Skin: Skin is warm and dry. No rash noted. No cyanosis. Nails show no clubbing.  Psychiatric: He has a normal mood and affect. His speech is normal and behavior is normal. Judgment and thought content normal. Cognition and memory are normal.    Results for orders placed or performed in visit on 04/09/17  Microalbumin / creatinine urine ratio  Result Value Ref Range   Creatinine, Urine 121 20 - 370 mg/dL   Microalb, Ur 0.7 Not estab mg/dL   Microalb Creat Ratio 6 <30 mcg/mg creat  Comprehensive metabolic panel  Result Value Ref Range   Sodium 143 135 - 146 mmol/L   Potassium 5.0 3.5 - 5.3 mmol/L   Chloride 109 98 - 110 mmol/L   CO2 29 20 - 31 mmol/L   Glucose, Bld 123 (H) 65 - 99 mg/dL   BUN 28 (H) 7 - 25 mg/dL   Creat 1.32 (H) 0.70 - 1.11 mg/dL   Total Bilirubin 0.7 0.2 - 1.2 mg/dL   Alkaline Phosphatase 85 40 - 115 U/L   AST 20 10 - 35 U/L   ALT 16 9 - 46 U/L   Total Protein 5.8 (L) 6.1 - 8.1 g/dL   Albumin 3.9 3.6 - 5.1 g/dL   Calcium 8.8 8.6 - 10.3 mg/dL  Lipid panel  Result Value Ref Range   Cholesterol 133 <200 mg/dL   Triglycerides 75 <150 mg/dL   HDL 44 >40 mg/dL   Total CHOL/HDL Ratio 3.0 <5.0 Ratio   VLDL 15 <30 mg/dL   LDL Cholesterol 74 <100 mg/dL  Hemoglobin A1c  Result Value Ref Range   Hgb A1c MFr Bld 8.1 (H) <5.7 %   Mean Plasma Glucose 186 mg/dL   Complete Blood Count (Most recent): Lab Results  Component Value Date   WBC 5.5 05/04/2016   HGB 10.7 (L) 05/04/2016   HCT 32.5 (L) 05/04/2016   MCV 92.1 05/04/2016   PLT 154 05/04/2016   Chemistry (most recent): Lab Results  Component Value Date   NA 143 04/09/2017   K 5.0 04/09/2017   CL 109 04/09/2017   CO2 29 04/09/2017   BUN 28 (H) 04/09/2017   CREATININE 1.32 (H) 04/09/2017   Diabetic  Labs (most recent): Lab Results  Component Value Date   HGBA1C 8.1 (H) 04/09/2017   HGBA1C 7.2 (H) 01/01/2017   HGBA1C 6.8 (H) 09/28/2016     Assessment & Plan:   1. Type 2 diabetes mellitus with vascular disease (  [place])  his diabetes is  complicated by coronary artery disease. Patient came with above target fasting glucose profile, and  recent A1c  8.1% increasing from 7.2%.   Glucose logs and insulin administration records pertaining to this visit,  to be scanned into patient's records.    Recent labs reviewed, His creatinine stable at 1.21 GFR of 50. - Patient remains at a high risk for more acute and chronic complications of diabetes which include CAD, CVA, CKD, retinopathy, and neuropathy. These are all discussed in detail with the patient.  - I have re-counseled the patient on diet management and   by adopting a carbohydrate restricted / protein rich  Diet. - Patient is advised to stick to a routine mealtimes to eat 3 meals  a day and avoid unnecessary snacks ( to snack only to correct hypoglycemia).  - Suggestion is made for patient to avoid simple carbohydrates   from their diet including Cakes , Desserts, Ice Cream,  Soda (  diet and regular) , Sweet Tea , Candies,  Chips, Cookies, Artificial Sweeteners,   and "Sugar-free" Products .  This will help patient to have stable blood glucose profile and potentially avoid unintended  Weight gain.  - The patient  has been  scheduled with Jearld Fenton, RDN, CDE for individualized DM education. - I have approached patient with the following individualized plan to manage diabetes and patient agrees.  -I will keep him off of  Metformin  for now due to CKD.  - I will increase Lantus to 20 units daily at bedtime, monitor before breakfast everyday. Target A1c for him would be between 7 and 7.5%.   - Patient specific target  for A1c; LDL, HDL, Triglycerides, and  Waist Circumference were discussed in detail.  2) BP/HTN: Controlled.  Continue current medications including ACEI. 3) Lipids/HPL: continue statins. 4)  Weight/Diet: CDE consult in progress, exercise, and carbohydrates information provided.  5) Chronic Care/Health Maintenance:  -Patient  on ACEI and Statin medications and encouraged to continue to follow up with Ophthalmology, Podiatrist at least yearly or according to recommendations, and advised to  stay away from smoking. I have recommended yearly flu vaccine and pneumonia vaccination at least every 5 years; moderate intensity exercise for up to 150 minutes weekly; and  sleep for at least 7 hours a day.  I advised patient to maintain close follow up with their PCP for primary care needs.  Patient is asked to bring meter and  blood glucose logs during their next visit.   Follow up plan: Return in about 3 months (around 07/23/2017) for follow up with pre-visit labs, meter, and logs.  Glade Lloyd, MD Phone: [phone]  Fax: [phone]   04/22/2017, 9:28 AM

## 2017-05-02 ENCOUNTER — Other Ambulatory Visit: Payer: Self-pay | Admitting: "Endocrinology

## 2017-05-02 DIAGNOSIS — E118 Type 2 diabetes mellitus with unspecified complications: Secondary | ICD-10-CM

## 2017-05-14 DIAGNOSIS — L400 Psoriasis vulgaris: Secondary | ICD-10-CM | POA: Diagnosis not present

## 2017-05-14 DIAGNOSIS — L570 Actinic keratosis: Secondary | ICD-10-CM | POA: Diagnosis not present

## 2017-06-04 DIAGNOSIS — B351 Tinea unguium: Secondary | ICD-10-CM | POA: Diagnosis not present

## 2017-06-04 DIAGNOSIS — E1142 Type 2 diabetes mellitus with diabetic polyneuropathy: Secondary | ICD-10-CM | POA: Diagnosis not present

## 2017-07-09 DIAGNOSIS — E663 Overweight: Secondary | ICD-10-CM | POA: Diagnosis not present

## 2017-07-09 DIAGNOSIS — Z1389 Encounter for screening for other disorder: Secondary | ICD-10-CM | POA: Diagnosis not present

## 2017-07-09 DIAGNOSIS — Z6827 Body mass index (BMI) 27.0-27.9, adult: Secondary | ICD-10-CM | POA: Diagnosis not present

## 2017-07-09 DIAGNOSIS — H353134 Nonexudative age-related macular degeneration, bilateral, advanced atrophic with subfoveal involvement: Secondary | ICD-10-CM | POA: Diagnosis not present

## 2017-07-09 DIAGNOSIS — Z961 Presence of intraocular lens: Secondary | ICD-10-CM | POA: Diagnosis not present

## 2017-07-09 DIAGNOSIS — E119 Type 2 diabetes mellitus without complications: Secondary | ICD-10-CM | POA: Diagnosis not present

## 2017-07-09 DIAGNOSIS — Z794 Long term (current) use of insulin: Secondary | ICD-10-CM | POA: Diagnosis not present

## 2017-07-09 DIAGNOSIS — R201 Hypoesthesia of skin: Secondary | ICD-10-CM | POA: Diagnosis not present

## 2017-07-09 DIAGNOSIS — B351 Tinea unguium: Secondary | ICD-10-CM | POA: Diagnosis not present

## 2017-07-09 DIAGNOSIS — Z0000 Encounter for general adult medical examination without abnormal findings: Secondary | ICD-10-CM | POA: Diagnosis not present

## 2017-07-09 DIAGNOSIS — I10 Essential (primary) hypertension: Secondary | ICD-10-CM | POA: Diagnosis not present

## 2017-07-09 LAB — HM DIABETES EYE EXAM

## 2017-07-12 ENCOUNTER — Other Ambulatory Visit: Payer: Self-pay | Admitting: "Endocrinology

## 2017-07-12 DIAGNOSIS — E118 Type 2 diabetes mellitus with unspecified complications: Secondary | ICD-10-CM | POA: Diagnosis not present

## 2017-07-12 LAB — COMPREHENSIVE METABOLIC PANEL
ALBUMIN: 3.9 g/dL (ref 3.6–5.1)
ALT: 13 U/L (ref 9–46)
AST: 15 U/L (ref 10–35)
Alkaline Phosphatase: 92 U/L (ref 40–115)
BUN: 27 mg/dL — ABNORMAL HIGH (ref 7–25)
CALCIUM: 8.9 mg/dL (ref 8.6–10.3)
CHLORIDE: 105 mmol/L (ref 98–110)
CO2: 27 mmol/L (ref 20–32)
Creat: 1.31 mg/dL — ABNORMAL HIGH (ref 0.70–1.11)
Glucose, Bld: 86 mg/dL (ref 65–99)
Potassium: 4.8 mmol/L (ref 3.5–5.3)
Sodium: 141 mmol/L (ref 135–146)
TOTAL PROTEIN: 5.9 g/dL — AB (ref 6.1–8.1)
Total Bilirubin: 0.8 mg/dL (ref 0.2–1.2)

## 2017-07-13 LAB — HEMOGLOBIN A1C
Hgb A1c MFr Bld: 7.5 % — ABNORMAL HIGH (ref <5.7)
Mean Plasma Glucose: 169 mg/dL

## 2017-07-23 ENCOUNTER — Ambulatory Visit (INDEPENDENT_AMBULATORY_CARE_PROVIDER_SITE_OTHER): Payer: Medicare Other | Admitting: "Endocrinology

## 2017-07-23 ENCOUNTER — Encounter: Payer: Self-pay | Admitting: "Endocrinology

## 2017-07-23 VITALS — BP 132/65 | HR 54 | Ht 72.0 in | Wt 190.0 lb

## 2017-07-23 DIAGNOSIS — E782 Mixed hyperlipidemia: Secondary | ICD-10-CM | POA: Diagnosis not present

## 2017-07-23 DIAGNOSIS — E1159 Type 2 diabetes mellitus with other circulatory complications: Secondary | ICD-10-CM

## 2017-07-23 DIAGNOSIS — I10 Essential (primary) hypertension: Secondary | ICD-10-CM

## 2017-07-23 NOTE — Patient Instructions (Signed)

## 2017-07-23 NOTE — Progress Notes (Signed)
Subjective:    Patient ID: Grant [name], male    DOB: 12/28/1933,    Past Medical History:  Diagnosis Date  . Arthritis   . CAD (coronary artery disease)   . Cancer ([place])    skin cancer of forehead  . Diabetes mellitus   . Erectile dysfunction   . GERD (gastroesophageal reflux disease)   . Gout 09-09-13   last flare 8'14 with gallbladder attack at [facility]  . Hyperlipidemia   . Hypertension   . Macular degeneration 09-09-13   bilateral"vision is poor"  . Pancreatitis 10--8-14   8'14 -enzymes improved  . Psoriasis 09-09-13   elbows, knees  . RBBB   . Tubular adenoma    Past Surgical History:  Procedure Laterality Date  . CARDIAC CATHETERIZATION  02/19/2011   3 vessel CAD  . CATARACT EXTRACTION W/PHACO Left 02/14/2016   Procedure: CATARACT EXTRACTION PHACO AND INTRAOCULAR LENS PLACEMENT (IOC);  Surgeon: Rutherford Guys, MD;  Location: [facility];  Service: Ophthalmology;  Laterality: Left;  CDE:6.96  . CATARACT EXTRACTION W/PHACO Right 02/28/2016   Procedure: CATARACT EXTRACTION PHACO AND INTRAOCULAR LENS PLACEMENT (IOC);  Surgeon: Rutherford Guys, MD;  Location: [facility];  Service: Ophthalmology;  Laterality: Right;  CDE:9.54  . CHOLECYSTECTOMY N/A 10/27/2013   Procedure: LAPAROSCOPIC CHOLECYSTECTOMY WITH INTRAOPERATIVE CHOLANGIOGRAM WITH LIVER BIOPSY;  Surgeon: Pedro Earls, MD;  Location: [facility];  Service: General;  Laterality: N/A;  . COLONOSCOPY  02/25/2012   Procedure: COLONOSCOPY;  Surgeon: Daneil Dolin, MD;  Location: [facility];  Service: Endoscopy;  Laterality: N/A;  7:30 AM polyps removed.  . COLONOSCOPY N/A 02/11/2015   Dr.Rourk- multiple colonic polyps bx= tubular adenoma and an inflammatory polyp  . CORONARY ARTERY BYPASS GRAFT  02-28-2011   LIMA to LAD,SVG to acute marginal branch of RCA & sequential SVG to OM1 & OM2.  . ESOPHAGOGASTRODUODENOSCOPY N/A 05/10/2016   RMR: mild schatzki ring, medium sized hh, H.pylori gastritis  . NM MYOCAR PERF WALL MOTION  02/15/2011    High Risk  . PILONIDAL CYST EXCISION    . US ECHOCARDIOGRAPHY  11/09/2008   mildly impaired diastolic dysfunction - Grade I   Social History   Social History  . Marital status: Married    Spouse name: N/A  . Number of children: N/A  . Years of education: N/A   Social History Main Topics  . Smoking status: Former Smoker    Packs/day: 0.50    Years: 25.00    Types: Cigarettes    Quit date: 02/25/1983  . Smokeless tobacco: Never Used  . Alcohol use Yes     Comment: occasional  . Drug use: No  . Sexual activity: No   Other Topics Concern  . None   Social History Narrative  . None   Outpatient Encounter Prescriptions as of 07/23/2017  Medication Sig  . allopurinol (ZYLOPRIM) 100 MG tablet Take 100 mg by mouth daily with breakfast.   . aspirin 325 MG tablet Take 325 mg by mouth daily.  Marland Kitchen atorvastatin (LIPITOR) 40 MG tablet Take 40 mg by mouth every evening.   . betamethasone dipropionate (DIPROLENE) 0.05 % cream Apply 1 application topically daily as needed (siriasis).   . carvedilol (COREG) 12.5 MG tablet Take 12.5 mg by mouth 2 (two) times daily with a meal.  . escitalopram (LEXAPRO) 10 MG tablet Take 10 mg by mouth daily.  . Insulin Glargine (LANTUS SOLOSTAR) 100 UNIT/ML Solostar Pen Inject 20 Units into the skin daily at  10 pm.  . Misc Natural Products (TART CHERRY ADVANCED PO) Take 1,200 mg by mouth daily.  . Multiple Vitamin (MULTIVITAMIN) tablet Take 1 tablet by mouth daily.  . Multiple Vitamins-Minerals (PRESERVISION AREDS PO) Take 1 capsule by mouth 2 (two) times daily.   . pantoprazole (PROTONIX) 40 MG tablet Take 1 tablet (40 mg total) by mouth daily.  . quinapril (ACCUPRIL) 20 MG tablet Take 20 mg by mouth every morning.   . ranitidine (ZANTAC) 150 MG tablet Take 1 tablet (150 mg total) by mouth 2 (two) times daily.   No facility-administered encounter medications on file as of 07/23/2017.    ALLERGIES: Allergies  Allergen Reactions  . Amiodarone Nausea Only    VACCINATION STATUS: Immunization History  Administered Date(s) Administered  . Influenza-Unspecified 09/02/2013    Diabetes  He presents for his follow-up diabetic visit. He has type 2 diabetes mellitus. Onset time: He was diagnosed at approximate age of 34 years. His disease course has been improving. There are no hypoglycemic associated symptoms. Pertinent negatives for hypoglycemia include no confusion, headaches, pallor or seizures. There are no diabetic associated symptoms. Pertinent negatives for diabetes include no chest pain, no fatigue, no polydipsia, no polyphagia, no polyuria and no weakness. There are no hypoglycemic complications. Symptoms are improving. Diabetic complications include heart disease. Risk factors for coronary artery disease include dyslipidemia, diabetes mellitus, hypertension and tobacco exposure. He is compliant with treatment most of the time. His weight is decreasing steadily. He is following a generally healthy diet. He participates in exercise intermittently. His home blood glucose trend is decreasing steadily. His breakfast blood glucose range is generally 130-140 mg/dl. His overall blood glucose range is 130-140 mg/dl. Eye exam is current.  Hypertension  This is a chronic problem. The current episode started more than 1 year ago. Pertinent negatives include no chest pain, headaches, neck pain, palpitations or shortness of breath. Hypertensive end-organ damage includes CAD/MI.  Hyperlipidemia  This is a chronic problem. The current episode started more than 1 year ago. The problem is controlled. Pertinent negatives include no chest pain, myalgias or shortness of breath.     Review of Systems  Constitutional: Positive for unexpected weight change. Negative for fatigue.  HENT: Negative for dental problem, mouth sores and trouble swallowing.   Eyes: Negative for visual disturbance.  Respiratory: Negative for cough, choking, chest tightness, shortness of breath  and wheezing.   Cardiovascular: Negative for chest pain, palpitations and leg swelling.  Gastrointestinal: Negative for abdominal distention, abdominal pain, constipation, diarrhea, nausea and vomiting.  Endocrine: Negative for polydipsia, polyphagia and polyuria.  Genitourinary: Negative for dysuria, flank pain, hematuria and urgency.  Musculoskeletal: Negative for back pain, gait problem, myalgias and neck pain.  Skin: Negative for pallor, rash and wound.  Neurological: Negative for seizures, syncope, weakness, numbness and headaches.  Psychiatric/Behavioral: Negative.  Negative for confusion and dysphoric mood.    Objective:    BP 132/65   Pulse (!) 54   Ht 6' (1.829 m)   Wt 190 lb (86.2 kg)   BMI 25.77 kg/m   Wt Readings from Last 3 Encounters:  07/23/17 190 lb (86.2 kg)  04/22/17 194 lb (88 kg)  01/09/17 195 lb (88.5 kg)    Physical Exam  Constitutional: He is oriented to person, place, and time. He appears well-developed and well-nourished. He is cooperative. No distress.  HENT:  Head: Normocephalic and atraumatic.  Eyes: EOM are normal.  Neck: Normal range of motion. Neck supple. No tracheal deviation present.  No thyromegaly present.  Cardiovascular: Normal rate, S1 normal, S2 normal and normal heart sounds.  Exam reveals no gallop.   No murmur heard. Pulses:      Dorsalis pedis pulses are 1+ on the right side, and 1+ on the left side.       Posterior tibial pulses are 1+ on the right side, and 1+ on the left side.  Pulmonary/Chest: Breath sounds normal. No respiratory distress. He has no wheezes.  Abdominal: Soft. Bowel sounds are normal. He exhibits no distension. There is no tenderness. There is no guarding and no CVA tenderness.  Musculoskeletal: He exhibits no edema.       Right shoulder: He exhibits no swelling and no deformity.  Neurological: He is alert and oriented to person, place, and time. He has normal strength and normal reflexes. No cranial nerve deficit  or sensory deficit. Gait normal.  Skin: Skin is warm and dry. No rash noted. No cyanosis. Nails show no clubbing.  Psychiatric: He has a normal mood and affect. His speech is normal and behavior is normal. Judgment and thought content normal. Cognition and memory are normal.    Results for orders placed or performed in visit on 07/12/17  Hemoglobin A1c  Result Value Ref Range   Hgb A1c MFr Bld 7.5 (H) <5.7 %   Mean Plasma Glucose 169 mg/dL   Complete Blood Count (Most recent): Lab Results  Component Value Date   WBC 5.5 05/04/2016   HGB 10.7 (L) 05/04/2016   HCT 32.5 (L) 05/04/2016   MCV 92.1 05/04/2016   PLT 154 05/04/2016   Chemistry (most recent): Lab Results  Component Value Date   NA 141 07/12/2017   K 4.8 07/12/2017   CL 105 07/12/2017   CO2 27 07/12/2017   BUN 27 (H) 07/12/2017   CREATININE 1.31 (H) 07/12/2017   Diabetic Labs (most recent): Lab Results  Component Value Date   HGBA1C 7.5 (H) 07/12/2017   HGBA1C 8.1 (H) 04/09/2017   HGBA1C 7.2 (H) 01/01/2017     Assessment & Plan:   1. Type 2 diabetes mellitus with vascular disease ([place])  his diabetes is  complicated by coronary artery disease. Patient came with near target fasting glucose profile, and  recent A1c   7.5% improving from 8.1% .   Glucose logs and insulin administration records pertaining to this visit,  to be scanned into patient's records.  - Patient remains at a high risk for more acute and chronic complications of diabetes which include CAD, CVA, CKD, retinopathy, and neuropathy. These are all discussed in detail with the patient.  - I have re-counseled the patient on diet management and   by adopting a carbohydrate restricted / protein rich  Diet. - Patient is advised to stick to a routine mealtimes to eat 3 meals  a day and avoid unnecessary snacks ( to snack only to correct hypoglycemia).  Suggestion is made for him to avoid simple carbohydrates  from his diet including Cakes, Sweet  Desserts, Ice Cream, Soda (diet and regular), Sweet Tea, Candies, Chips, Cookies, Store Bought Juices, Alcohol in Excess of  1-2 drinks a day, Artificial Sweeteners, and "Sugar-free" Products. This will help patient to have stable blood glucose profile and potentially avoid unintended weight gain.   - I have approached patient with the following individualized plan to manage diabetes and patient agrees.  -I will keep him off of  Metformin  for now due to CKD.  - I will continue Lantus  20  units daily at bedtime, monitor before breakfast everyday. Target A1c for him would be between 7 and 7.5%.  - Patient specific target  for A1c; LDL, HDL, Triglycerides, and  Waist Circumference were discussed in detail.  2) BP/HTN: Controlled. Continue current medications including ACEI. 3) Lipids/HPL: continue statins. 4)  Weight/Diet: CDE consult in progress, exercise, and carbohydrates information provided.  5) Chronic Care/Health Maintenance:  -Patient  on ACEI and Statin medications and encouraged to continue to follow up with Ophthalmology, Podiatrist at least yearly or according to recommendations, and advised to  stay away from smoking. I have recommended yearly flu vaccine and pneumonia vaccination at least every 5 years; moderate intensity exercise for up to 150 minutes weekly; and  sleep for at least 7 hours a day.  I advised patient to maintain close follow up with his PCP for primary care needs.  Patient is asked to bring meter and  blood glucose logs during his next visit.  - Time spent with the patient: 25 min, of which >50% was spent in reviewing his sugar logs , discussing his hypo- and hyper-glycemic episodes, reviewing  previous labs and insulin doses and developing a plan to avoid hypo- and hyper-glycemia.   Follow up plan: Return in about 3 months (around 10/23/2017) for meter, and logs.  Glade Lloyd, MD Phone: [phone]  Fax: [phone]   This note was partially dictated with  voice recognition software. Similar sounding words can be transcribed inadequately or may not  be corrected upon review.  07/23/2017, 2:53 PM

## 2017-07-25 DIAGNOSIS — D631 Anemia in chronic kidney disease: Secondary | ICD-10-CM | POA: Diagnosis not present

## 2017-07-25 DIAGNOSIS — E1122 Type 2 diabetes mellitus with diabetic chronic kidney disease: Secondary | ICD-10-CM | POA: Diagnosis not present

## 2017-07-25 DIAGNOSIS — R809 Proteinuria, unspecified: Secondary | ICD-10-CM | POA: Diagnosis not present

## 2017-07-25 DIAGNOSIS — E875 Hyperkalemia: Secondary | ICD-10-CM | POA: Diagnosis not present

## 2017-07-25 DIAGNOSIS — N183 Chronic kidney disease, stage 3 (moderate): Secondary | ICD-10-CM | POA: Diagnosis not present

## 2017-07-25 DIAGNOSIS — E785 Hyperlipidemia, unspecified: Secondary | ICD-10-CM | POA: Diagnosis not present

## 2017-07-25 DIAGNOSIS — I129 Hypertensive chronic kidney disease with stage 1 through stage 4 chronic kidney disease, or unspecified chronic kidney disease: Secondary | ICD-10-CM | POA: Diagnosis not present

## 2017-08-20 DIAGNOSIS — B351 Tinea unguium: Secondary | ICD-10-CM | POA: Diagnosis not present

## 2017-08-20 DIAGNOSIS — E1142 Type 2 diabetes mellitus with diabetic polyneuropathy: Secondary | ICD-10-CM | POA: Diagnosis not present

## 2017-09-05 ENCOUNTER — Encounter: Payer: Self-pay | Admitting: Internal Medicine

## 2017-09-10 ENCOUNTER — Ambulatory Visit (INDEPENDENT_AMBULATORY_CARE_PROVIDER_SITE_OTHER): Payer: Medicare Other | Admitting: Cardiovascular Disease

## 2017-09-10 ENCOUNTER — Encounter: Payer: Self-pay | Admitting: Cardiovascular Disease

## 2017-09-10 VITALS — BP 120/62 | HR 61 | Ht 72.0 in | Wt 190.0 lb

## 2017-09-10 DIAGNOSIS — Z794 Long term (current) use of insulin: Secondary | ICD-10-CM

## 2017-09-10 DIAGNOSIS — I453 Trifascicular block: Secondary | ICD-10-CM

## 2017-09-10 DIAGNOSIS — N183 Chronic kidney disease, stage 3 (moderate): Secondary | ICD-10-CM

## 2017-09-10 DIAGNOSIS — E7800 Pure hypercholesterolemia, unspecified: Secondary | ICD-10-CM

## 2017-09-10 DIAGNOSIS — I10 Essential (primary) hypertension: Secondary | ICD-10-CM

## 2017-09-10 DIAGNOSIS — I2510 Atherosclerotic heart disease of native coronary artery without angina pectoris: Secondary | ICD-10-CM | POA: Diagnosis not present

## 2017-09-10 DIAGNOSIS — E1122 Type 2 diabetes mellitus with diabetic chronic kidney disease: Secondary | ICD-10-CM

## 2017-09-10 MED ORDER — ASPIRIN EC 81 MG PO TBEC: 81.0000 mg | DELAYED_RELEASE_TABLET | Freq: Every day | ORAL | Status: DC

## 2017-09-10 NOTE — Patient Instructions (Signed)
Dr Sallyanne Kuster has recommended making the following medication changes: 1. DECREASE Aspirin to 81 mg daily  Your physician recommends that you schedule a follow-up appointment in 12 months. You will receive a reminder letter in the mail two months in advance. If you don't receive a letter, please call our office to schedule the follow-up appointment.  If you need a refill on your cardiac medications before your next appointment, please call your pharmacy.   Please call the office if you feel symptoms or passing out, lightheadedness/dizziness, or increased fatigue.

## 2017-09-10 NOTE — Progress Notes (Signed)
Cardiology Office Note    Date:  09/10/2017   ID:  Grant [name], DOB [birth year], MRN [id number]  PCP:  Redmond School, MD  Cardiologist:   Sanda Klein, MD   No chief complaint on file.   History of Present Illness:  Grant [name] is a 83 y.o. male of coronary artery disease who is now roughly 6 years status post multivessel bypass surgery and also has hyperlipidemia, hypertension, mild diabetes mellitus, history of gout and advanced macular degeneration.   He has not had any new cardiovascular problems or any serious medical events since his last appointment. Glycemic control deteriorated over last year, but is getting better with his last hemoglobin A1c at 7.5%. He had a good recent lipid profile.  His vision remains poor, but he can still watch television, although he cannot read text.  The patient specifically denies any chest pain at rest or with exertion, dyspnea at rest or with exertion, orthopnea, paroxysmal nocturnal dyspnea, syncope, palpitations, focal neurological deficits, intermittent claudication, lower extremity edema, unexplained weight gain, cough, hemoptysis or wheezing.  The patient also denies abdominal pain, nausea, vomiting, dysphagia, diarrhea, constipation, polyuria, polydipsia, dysuria, hematuria, frequency, urgency, abnormal bleeding or bruising, fever, chills, unexpected weight changes, mood swings, change in skin or hair texture, change in voice quality, auditory or visual problems, allergic reactions or rashes, new musculoskeletal complaints other than usual "aches and pains".  Grant [name] underwent four-vessel bypass surgery in 2012 (LIMA to LAD, SVG to acute marginal-RCA, sequential SVG to OM1 and OM 2) by Dr. Roxan Hockey. He has treated hyperlipidemia, hypertension, type 2 diabetes mellitus and history of gout. He has a chronic right bundle branch block and in 2015 also developed a left anterior fascicular block and prolonged PR interval at approx.  250 ms.  Past Medical History:  Diagnosis Date  . Arthritis   . CAD (coronary artery disease)   . Cancer ([place])    skin cancer of forehead  . Diabetes mellitus   . Erectile dysfunction   . GERD (gastroesophageal reflux disease)   . Gout 09-09-13   last flare 8'14 with gallbladder attack at [facility]  . Hyperlipidemia   . Hypertension   . Macular degeneration 09-09-13   bilateral"vision is poor"  . Pancreatitis 10--8-14   8'14 -enzymes improved  . Psoriasis 09-09-13   elbows, knees  . RBBB   . Tubular adenoma     Past Surgical History:  Procedure Laterality Date  . CARDIAC CATHETERIZATION  02/19/2011   3 vessel CAD  . CATARACT EXTRACTION W/PHACO Left 02/14/2016   Procedure: CATARACT EXTRACTION PHACO AND INTRAOCULAR LENS PLACEMENT (IOC);  Surgeon: Rutherford Guys, MD;  Location: [facility];  Service: Ophthalmology;  Laterality: Left;  CDE:6.96  . CATARACT EXTRACTION W/PHACO Right 02/28/2016   Procedure: CATARACT EXTRACTION PHACO AND INTRAOCULAR LENS PLACEMENT (IOC);  Surgeon: Rutherford Guys, MD;  Location: [facility];  Service: Ophthalmology;  Laterality: Right;  CDE:9.54  . CHOLECYSTECTOMY N/A 10/27/2013   Procedure: LAPAROSCOPIC CHOLECYSTECTOMY WITH INTRAOPERATIVE CHOLANGIOGRAM WITH LIVER BIOPSY;  Surgeon: Pedro Earls, MD;  Location: [facility];  Service: General;  Laterality: N/A;  . COLONOSCOPY  02/25/2012   Procedure: COLONOSCOPY;  Surgeon: Daneil Dolin, MD;  [facility];  Service: Endoscopy;  Laterality: N/A;  7:30 AM polyps removed.  . COLONOSCOPY N/A 02/11/2015   Dr.Rourk- multiple colonic polyps bx= tubular adenoma and an inflammatory polyp  . CORONARY ARTERY BYPASS GRAFT  02-28-2011   LIMA to LAD,SVG to acute marginal branch  of RCA & sequential SVG to OM1 & OM2.  . ESOPHAGOGASTRODUODENOSCOPY N/A 05/10/2016   RMR: mild schatzki ring, medium sized hh, H.pylori gastritis  . NM MYOCAR PERF WALL MOTION  02/15/2011   High Risk  . PILONIDAL CYST EXCISION    . US ECHOCARDIOGRAPHY   11/09/2008   mildly impaired diastolic dysfunction - Grade I    Current Medications: Outpatient Medications Prior to Visit  Medication Sig Dispense Refill  . allopurinol (ZYLOPRIM) 100 MG tablet Take 100 mg by mouth daily with breakfast.     . atorvastatin (LIPITOR) 40 MG tablet Take 40 mg by mouth every evening.     . betamethasone dipropionate (DIPROLENE) 0.05 % cream Apply 1 application topically daily as needed (siriasis).   12  . carvedilol (COREG) 12.5 MG tablet Take 12.5 mg by mouth 2 (two) times daily with a meal.    . escitalopram (LEXAPRO) 10 MG tablet Take 10 mg by mouth daily.    . Insulin Glargine (LANTUS SOLOSTAR) 100 UNIT/ML Solostar Pen Inject 20 Units into the skin daily at 10 pm. 15 mL 1  . Misc Natural Products (TART CHERRY ADVANCED PO) Take 1,200 mg by mouth daily.    . Multiple Vitamin (MULTIVITAMIN) tablet Take 1 tablet by mouth daily.    . Multiple Vitamins-Minerals (PRESERVISION AREDS PO) Take 1 capsule by mouth 2 (two) times daily.     . pantoprazole (PROTONIX) 40 MG tablet Take 1 tablet (40 mg total) by mouth daily. 30 tablet 11  . quinapril (ACCUPRIL) 20 MG tablet Take 20 mg by mouth every morning.     Marland Kitchen aspirin 325 MG tablet Take 325 mg by mouth daily.    . ranitidine (ZANTAC) 150 MG tablet Take 1 tablet (150 mg total) by mouth 2 (two) times daily. (Patient not taking: Reported on 09/10/2017) 60 tablet 3   No facility-administered medications prior to visit.      Allergies:   Amiodarone   Social History   Social History  . Marital status: Married    Spouse name: N/A  . Number of children: N/A  . Years of education: N/A   Social History Main Topics  . Smoking status: Former Smoker    Packs/day: 0.50    Years: 25.00    Types: Cigarettes    Quit date: 02/25/1983  . Smokeless tobacco: Never Used  . Alcohol use Yes     Comment: occasional  . Drug use: No  . Sexual activity: No   Other Topics Concern  . None   Social History Narrative  . None      Family History:  The patient's family history includes Cancer in his mother, sister, and sister; Heart failure in his brother and sister; Hypertension in his sister; Stroke in his father.   ROS:   Please see the history of present illness.    ROS All other systems reviewed and are negative.   PHYSICAL EXAM:   VS:  BP 120/62   Pulse 61   Ht 6' (1.829 m)   Wt 190 lb (86.2 kg)   BMI 25.77 kg/m     General: Alert, oriented x3, no distress, Relatively lean Head: no evidence of trauma, PERRL, EOMI, no exophtalmos or lid lag, no myxedema, no xanthelasma; normal ears, nose and oropharynx Neck: normal jugular venous pulsations and no hepatojugular reflux; brisk carotid pulses without delay and no carotid bruits Chest: clear to auscultation, no signs of consolidation by percussion or palpation, normal fremitus, symmetrical and full respiratory  excursions Cardiovascular: normal position and quality of the apical impulse, regular rhythm, normal first and widely split second heart sounds, no murmurs, rubs or gallops Abdomen: no tenderness or distention, no masses by palpation, no abnormal pulsatility or arterial bruits, normal bowel sounds, no hepatosplenomegaly Extremities: no clubbing, cyanosis or edema; 2+ radial, ulnar and brachial pulses bilaterally; 2+ right femoral, posterior tibial and dorsalis pedis pulses; 2+ left femoral, posterior tibial and dorsalis pedis pulses; no subclavian or femoral bruits Neurological: grossly nonfocal Psych: Normal mood and affect   Wt Readings from Last 3 Encounters:  09/10/17 190 lb (86.2 kg)  07/23/17 190 lb (86.2 kg)  04/22/17 194 lb (88 kg)      Studies/Labs Reviewed:   EKG:  EKG is ordered today.  The ekg ordered today Is essentially unchanged from a year ago: Sinus bradycardia, first-degree AV block (246 ms) right bundle branch block, left anterior fascicular block, QTC 437.  Recent Labs: 07/12/2017: ALT 13; BUN 27; Creat 1.31; Potassium 4.8;  Sodium 141   Lipid Panel     Component Value Date/Time   CHOL 133 04/09/2017 0949   TRIG 75 04/09/2017 0949   HDL 44 04/09/2017 0949   CHOLHDL 3.0 04/09/2017 0949   VLDL 15 04/09/2017 0949   LDLCALC 74 04/09/2017 0949   ASSESSMENT:    1. Coronary artery disease involving native coronary artery of native heart without angina pectoris   2. Essential hypertension, benign   3. Type 2 diabetes mellitus with stage 3 chronic kidney disease, with long-term current use of insulin ([place])   4. Pure hypercholesterolemia   5. Trifascicular block      PLAN:  In order of problems listed above:  1. CAD s/p CABG: Does not have angina, asymptomatic 2. HTN: Excellent control 3. HLP: Good recent lipid profile 4. DM: Recent deterioration in glycemic control, now improving again 5. Trifascicular block: Has not had syncope or other symptoms of high-grade AV block. Instructed to call us if he develops dyspnea on exertion, fatigue, syncope or near syncope.    Medication Adjustments/Labs and Tests Ordered: Current medicines are reviewed at length with the patient today.  Concerns regarding medicines are outlined above.  Medication changes, Labs and Tests ordered today are listed in the Patient Instructions below. Patient Instructions  Dr Sallyanne Kuster has recommended making the following medication changes: 1. DECREASE Aspirin to 81 mg daily  Your physician recommends that you schedule a follow-up appointment in 12 months. You will receive a reminder letter in the mail two months in advance. If you don't receive a letter, please call our office to schedule the follow-up appointment.  If you need a refill on your cardiac medications before your next appointment, please call your pharmacy.   Please call the office if you feel symptoms or passing out, lightheadedness/dizziness, or increased fatigue.    Signed, Sanda Klein, MD  09/10/2017 12:39 PM    [address], [place], [place]  [address] Phone: [phone]; Fax: [phone]

## 2017-09-16 DIAGNOSIS — Z23 Encounter for immunization: Secondary | ICD-10-CM | POA: Diagnosis not present

## 2017-10-17 DIAGNOSIS — E1159 Type 2 diabetes mellitus with other circulatory complications: Secondary | ICD-10-CM | POA: Diagnosis not present

## 2017-10-18 LAB — RENAL FUNCTION PANEL
ALBUMIN MSPROF: 3.9 g/dL (ref 3.6–5.1)
BUN/Creatinine Ratio: 18 (calc) (ref 6–22)
BUN: 25 mg/dL (ref 7–25)
CALCIUM: 8.9 mg/dL (ref 8.6–10.3)
CO2: 29 mmol/L (ref 20–32)
Chloride: 105 mmol/L (ref 98–110)
Creat: 1.36 mg/dL — ABNORMAL HIGH (ref 0.70–1.11)
GLUCOSE: 121 mg/dL (ref 65–139)
Phosphorus: 3.4 mg/dL (ref 2.1–4.3)
Potassium: 4.7 mmol/L (ref 3.5–5.3)
SODIUM: 140 mmol/L (ref 135–146)

## 2017-10-18 LAB — HEMOGLOBIN A1C
HEMOGLOBIN A1C: 7.6 %{Hb} — AB (ref <5.7)
Mean Plasma Glucose: 171 (calc)
eAG (mmol/L): 9.5 (calc)

## 2017-10-23 ENCOUNTER — Ambulatory Visit (INDEPENDENT_AMBULATORY_CARE_PROVIDER_SITE_OTHER): Payer: Medicare Other | Admitting: "Endocrinology

## 2017-10-23 ENCOUNTER — Encounter: Payer: Self-pay | Admitting: "Endocrinology

## 2017-10-23 VITALS — BP 132/69 | HR 59 | Ht 72.0 in | Wt 193.0 lb

## 2017-10-23 DIAGNOSIS — I10 Essential (primary) hypertension: Secondary | ICD-10-CM | POA: Diagnosis not present

## 2017-10-23 DIAGNOSIS — N183 Chronic kidney disease, stage 3 unspecified: Secondary | ICD-10-CM

## 2017-10-23 DIAGNOSIS — E1159 Type 2 diabetes mellitus with other circulatory complications: Secondary | ICD-10-CM

## 2017-10-23 DIAGNOSIS — E1122 Type 2 diabetes mellitus with diabetic chronic kidney disease: Secondary | ICD-10-CM

## 2017-10-23 DIAGNOSIS — I2510 Atherosclerotic heart disease of native coronary artery without angina pectoris: Secondary | ICD-10-CM

## 2017-10-23 DIAGNOSIS — Z794 Long term (current) use of insulin: Secondary | ICD-10-CM | POA: Diagnosis not present

## 2017-10-23 DIAGNOSIS — E782 Mixed hyperlipidemia: Secondary | ICD-10-CM | POA: Diagnosis not present

## 2017-10-23 MED ORDER — INSULIN GLARGINE 100 UNIT/ML SOLOSTAR PEN: 26.0000 [IU] | PEN_INJECTOR | Freq: Every day | SUBCUTANEOUS | 2 refills | Status: DC

## 2017-10-23 NOTE — Progress Notes (Signed)
Subjective:    Patient ID: Grant [name], male    DOB: [birth year],    Past Medical History:  Diagnosis Date  . Arthritis   . CAD (coronary artery disease)   . Cancer ([place])    skin cancer of forehead  . Diabetes mellitus   . Erectile dysfunction   . GERD (gastroesophageal reflux disease)   . Gout 09-09-13   last flare 8'14 with gallbladder attack at [facility]  . Hyperlipidemia   . Hypertension   . Macular degeneration 09-09-13   bilateral"vision is poor"  . Pancreatitis 10--8-14   8'14 -enzymes improved  . Psoriasis 09-09-13   elbows, knees  . RBBB   . Tubular adenoma    Past Surgical History:  Procedure Laterality Date  . CARDIAC CATHETERIZATION  02/19/2011   3 vessel CAD  . CATARACT EXTRACTION W/PHACO Left 02/14/2016   Procedure: CATARACT EXTRACTION PHACO AND INTRAOCULAR LENS PLACEMENT (IOC);  Surgeon: Rutherford Guys, MD;  Location: [facility];  Service: Ophthalmology;  Laterality: Left;  CDE:6.96  . CATARACT EXTRACTION W/PHACO Right 02/28/2016   Procedure: CATARACT EXTRACTION PHACO AND INTRAOCULAR LENS PLACEMENT (IOC);  Surgeon: Rutherford Guys, MD;  Location: [facility];  Service: Ophthalmology;  Laterality: Right;  CDE:9.54  . CHOLECYSTECTOMY N/A 10/27/2013   Procedure: LAPAROSCOPIC CHOLECYSTECTOMY WITH INTRAOPERATIVE CHOLANGIOGRAM WITH LIVER BIOPSY;  Surgeon: Pedro Earls, MD;  Location: [facility];  Service: General;  Laterality: N/A;  . COLONOSCOPY  02/25/2012   Procedure: COLONOSCOPY;  Surgeon: Daneil Dolin, MD;  Location: [facility];  Service: Endoscopy;  Laterality: N/A;  7:30 AM polyps removed.  . COLONOSCOPY N/A 02/11/2015   Dr.Rourk- multiple colonic polyps bx= tubular adenoma and an inflammatory polyp  . CORONARY ARTERY BYPASS GRAFT  02-28-2011   LIMA to LAD,SVG to acute marginal branch of RCA & sequential SVG to OM1 & OM2.  . ESOPHAGOGASTRODUODENOSCOPY N/A 05/10/2016   RMR: mild schatzki ring, medium sized hh, H.pylori gastritis  . NM MYOCAR PERF WALL MOTION  02/15/2011   High Risk  . PILONIDAL CYST EXCISION    . US ECHOCARDIOGRAPHY  11/09/2008   mildly impaired diastolic dysfunction - Grade I   Social History   Socioeconomic History  . Marital status: Married    Spouse name: None  . Number of children: None  . Years of education: None  . Highest education level: None  Social Needs  . Financial resource strain: None  . Food insecurity - worry: None  . Food insecurity - inability: None  . Transportation needs - medical: None  . Transportation needs - non-medical: None  Occupational History  . None  Tobacco Use  . Smoking status: Former Smoker    Packs/day: 0.50    Years: 25.00    Pack years: 12.50    Types: Cigarettes    Last attempt to quit: 02/25/1983    Years since quitting: 34.6  . Smokeless tobacco: Never Used  Substance and Sexual Activity  . Alcohol use: Yes    Comment: occasional  . Drug use: No  . Sexual activity: No  Other Topics Concern  . None  Social History Narrative  . None   Outpatient Encounter Medications as of 10/23/2017  Medication Sig  . allopurinol (ZYLOPRIM) 100 MG tablet Take 100 mg by mouth daily with breakfast.   . aspirin EC 81 MG tablet Take 1 tablet (81 mg total) by mouth daily.  Marland Kitchen atorvastatin (LIPITOR) 40 MG tablet Take 40 mg by mouth every evening.   Marland Kitchen  betamethasone dipropionate (DIPROLENE) 0.05 % cream Apply 1 application topically daily as needed (siriasis).   . carvedilol (COREG) 12.5 MG tablet Take 12.5 mg by mouth 2 (two) times daily with a meal.  . escitalopram (LEXAPRO) 10 MG tablet Take 10 mg by mouth daily.  . Insulin Glargine (LANTUS SOLOSTAR) 100 UNIT/ML Solostar Pen Inject 26 Units into the skin daily at 10 pm.  . Misc Natural Products (TART CHERRY ADVANCED PO) Take 1,200 mg by mouth daily.  . Multiple Vitamin (MULTIVITAMIN) tablet Take 1 tablet by mouth daily.  . Multiple Vitamins-Minerals (PRESERVISION AREDS PO) Take 1 capsule by mouth 2 (two) times daily.   . pantoprazole (PROTONIX) 40 MG  tablet Take 1 tablet (40 mg total) by mouth daily.  . quinapril (ACCUPRIL) 20 MG tablet Take 20 mg by mouth every morning.   . [DISCONTINUED] Insulin Glargine (LANTUS SOLOSTAR) 100 UNIT/ML Solostar Pen Inject 20 Units into the skin daily at 10 pm.   No facility-administered encounter medications on file as of 10/23/2017.    ALLERGIES: Allergies  Allergen Reactions  . Amiodarone Nausea Only   VACCINATION STATUS: Immunization History  Administered Date(s) Administered  . Influenza-Unspecified 09/02/2013    Diabetes  He presents for his follow-up diabetic visit. He has type 2 diabetes mellitus. Onset time: He was diagnosed at approximate age of 31 years. His disease course has been stable. There are no hypoglycemic associated symptoms. Pertinent negatives for hypoglycemia include no confusion, headaches, pallor or seizures. There are no diabetic associated symptoms. Pertinent negatives for diabetes include no chest pain, no fatigue, no polydipsia, no polyphagia, no polyuria and no weakness. There are no hypoglycemic complications. Symptoms are stable. Diabetic complications include heart disease. Risk factors for coronary artery disease include dyslipidemia, diabetes mellitus, hypertension and tobacco exposure. He is compliant with treatment most of the time. His weight is increasing steadily. He is following a generally healthy diet. He participates in exercise intermittently. There is no change in his home blood glucose trend. His breakfast blood glucose range is generally 130-140 mg/dl. His overall blood glucose range is 130-140 mg/dl. Eye exam is current.  Hypertension  This is a chronic problem. The current episode started more than 1 year ago. Pertinent negatives include no chest pain, headaches, neck pain, palpitations or shortness of breath. Hypertensive end-organ damage includes CAD/MI.  Hyperlipidemia  This is a chronic problem. The current episode started more than 1 year ago. The  problem is controlled. Pertinent negatives include no chest pain, myalgias or shortness of breath.     Review of Systems  Constitutional: Positive for unexpected weight change. Negative for fatigue.  HENT: Negative for dental problem, mouth sores and trouble swallowing.   Eyes: Negative for visual disturbance.  Respiratory: Negative for cough, choking, chest tightness, shortness of breath and wheezing.   Cardiovascular: Negative for chest pain, palpitations and leg swelling.  Gastrointestinal: Negative for abdominal distention, abdominal pain, constipation, diarrhea, nausea and vomiting.  Endocrine: Negative for polydipsia, polyphagia and polyuria.  Genitourinary: Negative for dysuria, flank pain, hematuria and urgency.  Musculoskeletal: Negative for back pain, gait problem, myalgias and neck pain.  Skin: Negative for pallor, rash and wound.  Neurological: Negative for seizures, syncope, weakness, numbness and headaches.  Psychiatric/Behavioral: Negative.  Negative for confusion and dysphoric mood.    Objective:    BP 132/69   Pulse (!) 59   Ht 6' (1.829 m)   Wt 193 lb (87.5 kg)   BMI 26.18 kg/m   Wt Readings from Last  3 Encounters:  10/23/17 193 lb (87.5 kg)  09/10/17 190 lb (86.2 kg)  07/23/17 190 lb (86.2 kg)    Physical Exam  Constitutional: He is oriented to person, place, and time. He appears well-developed and well-nourished. He is cooperative. No distress.  HENT:  Head: Normocephalic and atraumatic.  Eyes: EOM are normal.  Neck: Normal range of motion. Neck supple. No tracheal deviation present. No thyromegaly present.  Cardiovascular: Normal rate, S1 normal, S2 normal and normal heart sounds. Exam reveals no gallop.  No murmur heard. Pulses:      Dorsalis pedis pulses are 1+ on the right side, and 1+ on the left side.       Posterior tibial pulses are 1+ on the right side, and 1+ on the left side.  Pulmonary/Chest: Breath sounds normal. No respiratory distress. He  has no wheezes.  Abdominal: Soft. Bowel sounds are normal. He exhibits no distension. There is no tenderness. There is no guarding and no CVA tenderness.  Musculoskeletal: He exhibits no edema.       Right shoulder: He exhibits no swelling and no deformity.  Neurological: He is alert and oriented to person, place, and time. He has normal strength and normal reflexes. No cranial nerve deficit or sensory deficit. Gait normal.  Skin: Skin is warm and dry. No rash noted. No cyanosis. Nails show no clubbing.  Psychiatric: He has a normal mood and affect. His speech is normal and behavior is normal. Judgment and thought content normal. Cognition and memory are normal.    Results for orders placed or performed in visit on 07/23/17  Renal function panel  Result Value Ref Range   Glucose, Bld 121 65 - 139 mg/dL   BUN 25 7 - 25 mg/dL   Creat 1.36 (H) 0.70 - 1.11 mg/dL   BUN/Creatinine Ratio 18 6 - 22 (calc)   Sodium 140 135 - 146 mmol/L   Potassium 4.7 3.5 - 5.3 mmol/L   Chloride 105 98 - 110 mmol/L   CO2 29 20 - 32 mmol/L   Calcium 8.9 8.6 - 10.3 mg/dL   Phosphorus 3.4 2.1 - 4.3 mg/dL   Albumin 3.9 3.6 - 5.1 g/dL  Hemoglobin A1c  Result Value Ref Range   Hgb A1c MFr Bld 7.6 (H) <5.7 % of total Hgb   Mean Plasma Glucose 171 (calc)   eAG (mmol/L) 9.5 (calc)   Complete Blood Count (Most recent): Lab Results  Component Value Date   WBC 5.5 05/04/2016   HGB 10.7 (L) 05/04/2016   HCT 32.5 (L) 05/04/2016   MCV 92.1 05/04/2016   PLT 154 05/04/2016   Chemistry (most recent): Lab Results  Component Value Date   NA 140 10/17/2017   K 4.7 10/17/2017   CL 105 10/17/2017   CO2 29 10/17/2017   BUN 25 10/17/2017   CREATININE 1.36 (H) 10/17/2017   Diabetic Labs (most recent): Lab Results  Component Value Date   HGBA1C 7.6 (H) 10/17/2017   HGBA1C 7.5 (H) 07/12/2017   HGBA1C 8.1 (H) 04/09/2017     Assessment & Plan:   1. Type 2 diabetes mellitus with vascular disease ([place])  his  diabetes is  complicated by coronary artery disease. Patient came with near target fasting glucose profile, and  recent A1c   7.6% improving from 8.1% .   Glucose logs and insulin administration records pertaining to this visit,  to be scanned into patient's records.  - Patient remains at a high risk for more acute and  chronic complications of diabetes which include CAD, CVA, CKD, retinopathy, and neuropathy. These are all discussed in detail with the patient.  - I have re-counseled the patient on diet management and   by adopting a carbohydrate restricted / protein rich  Diet. - Patient is advised to stick to a routine mealtimes to eat 3 meals  a day and avoid unnecessary snacks ( to snack only to correct hypoglycemia).  -  Suggestion is made for him to avoid simple carbohydrates  from his diet including Cakes, Sweet Desserts / Pastries, Ice Cream, Soda (diet and regular), Sweet Tea, Candies, Chips, Cookies, Store Bought Juices, Alcohol in Excess of  1-2 drinks a day, Artificial Sweeteners, and "Sugar-free" Products. This will help patient to have stable blood glucose profile and potentially avoid unintended weight gain.   - I have approached patient with the following individualized plan to manage diabetes and patient agrees.  -I will keep him off of  Metformin  for now due to CKD.  - I will increase Lantus to 26 units daily at bedtime, monitor before breakfast everyday, and as needed.   Target A1c for him would be between 7 and 7.5%.  - Patient specific target  for A1c; LDL, HDL, Triglycerides, and  Waist Circumference were discussed in detail.  2) BP/HTN: Controlled. I advised him to continue current medications including  ACEI. 3) Lipids/HPL: continue statins. 4)  Weight/Diet: CDE consult in progress, exercise, and carbohydrates information provided.  5) Chronic Care/Health Maintenance:  -Patient  on ACEI and Statin medications and encouraged to continue to follow up with  Ophthalmology, Podiatrist at least yearly or according to recommendations, and advised to  stay away from smoking. I have recommended yearly flu vaccine and pneumonia vaccination at least every 5 years; moderate intensity exercise for up to 150 minutes weekly; and  sleep for at least 7 hours a day.  I advised patient to maintain close follow up with his PCP for primary care needs. - Time spent with the patient: 25 min, of which >50% was spent in reviewing his sugar logs , discussing his hypo- and hyper-glycemic episodes, reviewing his current and  previous labs and insulin doses and developing a plan to avoid hypo- and hyper-glycemia.   Follow up plan: Return in about 3 months (around 01/23/2018).  Glade Lloyd, MD Phone: [phone]  Fax: [phone]   This note was partially dictated with voice recognition software. Similar sounding words can be transcribed inadequately or may not  be corrected upon review.  10/23/2017, 1:55 PM

## 2017-10-29 DIAGNOSIS — E1142 Type 2 diabetes mellitus with diabetic polyneuropathy: Secondary | ICD-10-CM | POA: Diagnosis not present

## 2017-10-29 DIAGNOSIS — B351 Tinea unguium: Secondary | ICD-10-CM | POA: Diagnosis not present

## 2017-10-30 ENCOUNTER — Telehealth: Payer: Self-pay | Admitting: "Endocrinology

## 2017-10-30 MED ORDER — INSULIN GLARGINE 100 UNIT/ML SOLOSTAR PEN: 26.0000 [IU] | PEN_INJECTOR | Freq: Every day | SUBCUTANEOUS | 2 refills | Status: DC

## 2017-10-30 NOTE — Telephone Encounter (Signed)
Opened in error

## 2017-10-30 NOTE — Addendum Note (Signed)
Addended by: Lavell Luster A on: 10/30/2017 11:15 AM   Modules accepted: Orders

## 2017-10-30 NOTE — Telephone Encounter (Signed)
rx sent

## 2017-10-30 NOTE — Telephone Encounter (Signed)
Grant [name] is asking for a refill on his Insulin Glargine (LANTUS SOLOSTAR) 100 UNIT/ML Solostar Pen   please advise?

## 2017-11-05 ENCOUNTER — Ambulatory Visit: Payer: Medicare Other | Admitting: Internal Medicine

## 2017-12-17 ENCOUNTER — Encounter: Payer: Self-pay | Admitting: Internal Medicine

## 2017-12-17 ENCOUNTER — Ambulatory Visit (INDEPENDENT_AMBULATORY_CARE_PROVIDER_SITE_OTHER): Payer: PPO | Admitting: Internal Medicine

## 2017-12-17 VITALS — BP 161/61 | HR 57 | Temp 97.5°F | Ht 72.0 in | Wt 196.0 lb

## 2017-12-17 DIAGNOSIS — R634 Abnormal weight loss: Secondary | ICD-10-CM | POA: Diagnosis not present

## 2017-12-17 DIAGNOSIS — K219 Gastro-esophageal reflux disease without esophagitis: Secondary | ICD-10-CM | POA: Diagnosis not present

## 2017-12-17 NOTE — Progress Notes (Signed)
Primary Care Physician:  Redmond School, MD Primary Gastroenterologist:  Dr. Gala Romney  Pre-Procedure History & Physical: HPI:  Grant [name] is a 84 y.o. male here for follow-up of GERD and weight loss. He scans 5 pounds since he was here a year ago. His appetite is good no dysphagia. Reflux symptoms well controlled on Protonix 40 mg daily.  History of multiple adenomas removed his colon couple years ago with no plans for surveillance given his age. Blood pressure is elevated today.  Reports he took generic Zyrtec for sinus congestion; denies taking a decongestant recently.  Past Medical History:  Diagnosis Date  . Arthritis   . CAD (coronary artery disease)   . Cancer ([place])    skin cancer of forehead  . Diabetes mellitus   . Erectile dysfunction   . GERD (gastroesophageal reflux disease)   . Gout 09-09-13   last flare 8'14 with gallbladder attack at [facility]  . Hyperlipidemia   . Hypertension   . Macular degeneration 09-09-13   bilateral"vision is poor"  . Pancreatitis 10--8-14   8'14 -enzymes improved  . Psoriasis 09-09-13   elbows, knees  . RBBB   . Tubular adenoma     Past Surgical History:  Procedure Laterality Date  . CARDIAC CATHETERIZATION  02/19/2011   3 vessel CAD  . CATARACT EXTRACTION W/PHACO Left 02/14/2016   Procedure: CATARACT EXTRACTION PHACO AND INTRAOCULAR LENS PLACEMENT (IOC);  Surgeon: Rutherford Guys, MD;  Location: [facility];  Service: Ophthalmology;  Laterality: Left;  CDE:6.96  . CATARACT EXTRACTION W/PHACO Right 02/28/2016   Procedure: CATARACT EXTRACTION PHACO AND INTRAOCULAR LENS PLACEMENT (IOC);  Surgeon: Rutherford Guys, MD;  Location: [facility];  Service: Ophthalmology;  Laterality: Right;  CDE:9.54  . CHOLECYSTECTOMY N/A 10/27/2013   Procedure: LAPAROSCOPIC CHOLECYSTECTOMY WITH INTRAOPERATIVE CHOLANGIOGRAM WITH LIVER BIOPSY;  Surgeon: Pedro Earls, MD;  Location: [facility];  Service: General;  Laterality: N/A;  . COLONOSCOPY  02/25/2012   Procedure:  COLONOSCOPY;  Surgeon: Daneil Dolin, MD;  [facility];  Service: Endoscopy;  Laterality: N/A;  7:30 AM polyps removed.  . COLONOSCOPY N/A 02/11/2015   Dr.Parisa Pinela- multiple colonic polyps bx= tubular adenoma and an inflammatory polyp  . CORONARY ARTERY BYPASS GRAFT  02-28-2011   LIMA to LAD,SVG to acute marginal branch of RCA & sequential SVG to OM1 & OM2.  . ESOPHAGOGASTRODUODENOSCOPY N/A 05/10/2016   RMR: mild schatzki ring, medium sized hh, H.pylori gastritis  . NM MYOCAR PERF WALL MOTION  02/15/2011   High Risk  . PILONIDAL CYST EXCISION    . US ECHOCARDIOGRAPHY  11/09/2008   mildly impaired diastolic dysfunction - Grade I    Prior to Admission medications   Medication Sig Start Date End Date Taking? Authorizing Provider  allopurinol (ZYLOPRIM) 100 MG tablet Take 100 mg by mouth daily with breakfast.    Yes [provider]  aspirin EC 81 MG tablet Take 1 tablet (81 mg total) by mouth daily. 09/10/17  Yes Croitoru, Mihai, MD  atorvastatin (LIPITOR) 40 MG tablet Take 40 mg by mouth every evening.    Yes [provider]  betamethasone dipropionate (DIPROLENE) 0.05 % cream Apply 1 application topically daily as needed (siriasis).  11/22/15  Yes [provider]  carvedilol (COREG) 12.5 MG tablet Take 12.5 mg by mouth 2 (two) times daily with a meal.   Yes [provider]  escitalopram (LEXAPRO) 10 MG tablet Take 10 mg by mouth daily.   Yes [provider]  Insulin Glargine (LANTUS SOLOSTAR) 100 UNIT/ML Solostar Pen Inject 26 Units into the skin daily at 10 pm. 10/30/17  Yes Nida, Marella Chimes, MD  Misc Natural Products (TART CHERRY ADVANCED PO) Take 1,200 mg by mouth daily.   Yes [provider]  Multiple Vitamin (MULTIVITAMIN) tablet Take 1 tablet by mouth daily.   Yes [provider]  Multiple Vitamins-Minerals (PRESERVISION AREDS PO) Take 1 capsule by mouth 2 (two) times daily.    Yes [provider]    pantoprazole (PROTONIX) 40 MG tablet Take 1 tablet (40 mg total) by mouth daily. 11/13/16  Yes Raymon Schlarb, Cristopher Estimable, MD  quinapril (ACCUPRIL) 20 MG tablet Take 20 mg by mouth every morning.    Yes [provider]    Allergies as of 12/17/2017 - Review Complete 12/17/2017  Allergen Reaction Noted  . Amiodarone Nausea Only 07/23/2013    Family History  Problem Relation Age of Onset  . Stroke Father   . Cancer Mother        leukemia  . Heart failure Brother   . Cancer Sister        pancreas & liver & liver  . Hypertension Sister   . Cancer Sister        breast  . Heart failure Sister   . Anesthesia problems Neg Hx   . Hypotension Neg Hx   . Malignant hyperthermia Neg Hx   . Pseudochol deficiency Neg Hx   . Colon cancer Neg Hx     Social History   Socioeconomic History  . Marital status: Married    Spouse name: Not on file  . Number of children: Not on file  . Years of education: Not on file  . Highest education level: Not on file  Social Needs  . Financial resource strain: Not on file  . Food insecurity - worry: Not on file  . Food insecurity - inability: Not on file  . Transportation needs - medical: Not on file  . Transportation needs - non-medical: Not on file  Occupational History  . Not on file  Tobacco Use  . Smoking status: Former Smoker    Packs/day: 0.50    Years: 25.00    Pack years: 12.50    Types: Cigarettes    Last attempt to quit: 02/25/1983    Years since quitting: 34.8  . Smokeless tobacco: Never Used  Substance and Sexual Activity  . Alcohol use: Yes    Comment: occasional  . Drug use: No  . Sexual activity: No  Other Topics Concern  . Not on file  Social History Narrative  . Not on file    Review of Systems: See HPI, otherwise negative ROS  Physical Exam: BP (!) 161/61   Pulse (!) 57   Temp (!) 97.5 F (36.4 C) (Oral)   Ht 6' (1.829 m)   Wt 196 lb (88.9 kg)   BMI 26.58 kg/m  General:   Alert,  Well-developed,  well-nourished, pleasant and cooperative in NAD;  Accompanied by spouse. Neck:  Supple; no masses or thyromegaly. No significant cervical adenopathy. Lungs:  Clear throughout to auscultation.   No wheezes, crackles, or rhonchi. No acute distress. Heart:  Regular rate and rhythm; no murmurs, clicks, rubs,  or gallops. Abdomen: Non-distended, normal bowel sounds.  Soft and nontender without appreciable mass or hepatosplenomegaly.  Pulses:  Normal pulses noted. Extremities:  Without clubbing or edema.  Impression:   Pleasant 84 year old gentleman long-standing GERD -  now well controlled on Protonix  once daily. Weight is stable.  No active GI symptoms at this time. Blood pressure is elevated today which is unusual as patient and spouse report. It's been sometime since he seen Dr. Gerarda Fraction.  Recommendations:  GERD information provided  Continue Protonix 40 mg daily  See Dr. Gerarda Fraction about elevated blood pressure noted today  Office visit in 1 year       Notice: This dictation was prepared with Dragon dictation along with smaller phrase technology. Any transcriptional errors that result from this process are unintentional and may not be corrected upon review.

## 2017-12-17 NOTE — Patient Instructions (Signed)
GERD information provided  Continue Protonix 40 mg daily  See Dr. Gerarda Fraction about elevated blood pressure noted today  Office visit in 1 year

## 2017-12-26 DIAGNOSIS — Z1389 Encounter for screening for other disorder: Secondary | ICD-10-CM | POA: Diagnosis not present

## 2017-12-26 DIAGNOSIS — Z6828 Body mass index (BMI) 28.0-28.9, adult: Secondary | ICD-10-CM | POA: Diagnosis not present

## 2017-12-26 DIAGNOSIS — J209 Acute bronchitis, unspecified: Secondary | ICD-10-CM | POA: Diagnosis not present

## 2017-12-26 DIAGNOSIS — E1129 Type 2 diabetes mellitus with other diabetic kidney complication: Secondary | ICD-10-CM | POA: Diagnosis not present

## 2017-12-26 DIAGNOSIS — N529 Male erectile dysfunction, unspecified: Secondary | ICD-10-CM | POA: Diagnosis not present

## 2017-12-26 DIAGNOSIS — I10 Essential (primary) hypertension: Secondary | ICD-10-CM | POA: Diagnosis not present

## 2018-01-07 DIAGNOSIS — B351 Tinea unguium: Secondary | ICD-10-CM | POA: Diagnosis not present

## 2018-01-07 DIAGNOSIS — E1142 Type 2 diabetes mellitus with diabetic polyneuropathy: Secondary | ICD-10-CM | POA: Diagnosis not present

## 2018-01-13 DIAGNOSIS — E1159 Type 2 diabetes mellitus with other circulatory complications: Secondary | ICD-10-CM | POA: Diagnosis not present

## 2018-01-14 LAB — COMPLETE METABOLIC PANEL WITH GFR
AG Ratio: 2 (calc) (ref 1.0–2.5)
ALKALINE PHOSPHATASE (APISO): 81 U/L (ref 40–115)
ALT: 16 U/L (ref 9–46)
AST: 17 U/L (ref 10–35)
Albumin: 4.1 g/dL (ref 3.6–5.1)
BUN/Creatinine Ratio: 27 (calc) — ABNORMAL HIGH (ref 6–22)
BUN: 33 mg/dL — ABNORMAL HIGH (ref 7–25)
CALCIUM: 9.1 mg/dL (ref 8.6–10.3)
CO2: 29 mmol/L (ref 20–32)
CREATININE: 1.23 mg/dL — AB (ref 0.70–1.11)
Chloride: 107 mmol/L (ref 98–110)
GFR, EST NON AFRICAN AMERICAN: 55 mL/min/{1.73_m2} — AB (ref >=60)
GFR, Est African American: 63 mL/min/{1.73_m2} (ref >=60)
GLOBULIN: 2.1 g/dL (ref 1.9–3.7)
GLUCOSE: 105 mg/dL — AB (ref 65–99)
Potassium: 4.9 mmol/L (ref 3.5–5.3)
SODIUM: 142 mmol/L (ref 135–146)
Total Bilirubin: 0.7 mg/dL (ref 0.2–1.2)
Total Protein: 6.2 g/dL (ref 6.1–8.1)

## 2018-01-14 LAB — HEMOGLOBIN A1C
Hgb A1c MFr Bld: 7.7 % of total Hgb — ABNORMAL HIGH (ref <5.7)
MEAN PLASMA GLUCOSE: 174 (calc)
eAG (mmol/L): 9.7 (calc)

## 2018-01-23 ENCOUNTER — Encounter: Payer: Self-pay | Admitting: "Endocrinology

## 2018-01-23 ENCOUNTER — Ambulatory Visit (INDEPENDENT_AMBULATORY_CARE_PROVIDER_SITE_OTHER): Payer: PPO | Admitting: "Endocrinology

## 2018-01-23 VITALS — BP 132/71 | HR 86 | Ht 72.0 in | Wt 190.0 lb

## 2018-01-23 DIAGNOSIS — E1122 Type 2 diabetes mellitus with diabetic chronic kidney disease: Secondary | ICD-10-CM

## 2018-01-23 DIAGNOSIS — E1159 Type 2 diabetes mellitus with other circulatory complications: Secondary | ICD-10-CM

## 2018-01-23 DIAGNOSIS — E782 Mixed hyperlipidemia: Secondary | ICD-10-CM

## 2018-01-23 DIAGNOSIS — I10 Essential (primary) hypertension: Secondary | ICD-10-CM | POA: Diagnosis not present

## 2018-01-23 DIAGNOSIS — N183 Chronic kidney disease, stage 3 (moderate): Secondary | ICD-10-CM | POA: Diagnosis not present

## 2018-01-23 DIAGNOSIS — Z794 Long term (current) use of insulin: Secondary | ICD-10-CM | POA: Diagnosis not present

## 2018-01-23 NOTE — Patient Instructions (Signed)

## 2018-01-23 NOTE — Progress Notes (Signed)
Subjective:    Patient ID: Grant [name], male    DOB: 07/13/1934,    Past Medical History:  Diagnosis Date  . Arthritis   . CAD (coronary artery disease)   . Cancer ([place])    skin cancer of forehead  . Diabetes mellitus   . Erectile dysfunction   . GERD (gastroesophageal reflux disease)   . Gout 09-09-13   last flare 8'14 with gallbladder attack at [facility]  . Hyperlipidemia   . Hypertension   . Macular degeneration 09-09-13   bilateral"vision is poor"  . Pancreatitis 10--8-14   8'14 -enzymes improved  . Psoriasis 09-09-13   elbows, knees  . RBBB   . Tubular adenoma    Past Surgical History:  Procedure Laterality Date  . CARDIAC CATHETERIZATION  02/19/2011   3 vessel CAD  . CATARACT EXTRACTION W/PHACO Left 02/14/2016   Procedure: CATARACT EXTRACTION PHACO AND INTRAOCULAR LENS PLACEMENT (IOC);  Surgeon: Rutherford Guys, MD;  Location: [facility];  Service: Ophthalmology;  Laterality: Left;  CDE:6.96  . CATARACT EXTRACTION W/PHACO Right 02/28/2016   Procedure: CATARACT EXTRACTION PHACO AND INTRAOCULAR LENS PLACEMENT (IOC);  Surgeon: Rutherford Guys, MD;  Location: [facility];  Service: Ophthalmology;  Laterality: Right;  CDE:9.54  . CHOLECYSTECTOMY N/A 10/27/2013   Procedure: LAPAROSCOPIC CHOLECYSTECTOMY WITH INTRAOPERATIVE CHOLANGIOGRAM WITH LIVER BIOPSY;  Surgeon: Pedro Earls, MD;  Location: [facility];  Service: General;  Laterality: N/A;  . COLONOSCOPY  02/25/2012   Procedure: COLONOSCOPY;  Surgeon: Daneil Dolin, MD;  Location: [facility];  Service: Endoscopy;  Laterality: N/A;  7:30 AM polyps removed.  . COLONOSCOPY N/A 02/11/2015   Dr.Rourk- multiple colonic polyps bx= tubular adenoma and an inflammatory polyp  . CORONARY ARTERY BYPASS GRAFT  02-28-2011   LIMA to LAD,SVG to acute marginal branch of RCA & sequential SVG to OM1 & OM2.  . ESOPHAGOGASTRODUODENOSCOPY N/A 05/10/2016   RMR: mild schatzki ring, medium sized hh, H.pylori gastritis  . NM MYOCAR PERF WALL MOTION  02/15/2011    High Risk  . PILONIDAL CYST EXCISION    . US ECHOCARDIOGRAPHY  11/09/2008   mildly impaired diastolic dysfunction - Grade I   Social History   Socioeconomic History  . Marital status: Married    Spouse name: None  . Number of children: None  . Years of education: None  . Highest education level: None  Social Needs  . Financial resource strain: None  . Food insecurity - worry: None  . Food insecurity - inability: None  . Transportation needs - medical: None  . Transportation needs - non-medical: None  Occupational History  . None  Tobacco Use  . Smoking status: Former Smoker    Packs/day: 0.50    Years: 25.00    Pack years: 12.50    Types: Cigarettes    Last attempt to quit: 02/25/1983    Years since quitting: 34.9  . Smokeless tobacco: Never Used  Substance and Sexual Activity  . Alcohol use: Yes    Comment: occasional  . Drug use: No  . Sexual activity: No  Other Topics Concern  . None  Social History Narrative  . None   Outpatient Encounter Medications as of 01/23/2018  Medication Sig  . allopurinol (ZYLOPRIM) 100 MG tablet Take 100 mg by mouth daily with breakfast.   . aspirin EC 81 MG tablet Take 1 tablet (81 mg total) by mouth daily.  Marland Kitchen atorvastatin (LIPITOR) 40 MG tablet Take 40 mg by mouth every evening.   Marland Kitchen  betamethasone dipropionate (DIPROLENE) 0.05 % cream Apply 1 application topically daily as needed (siriasis).   . carvedilol (COREG) 12.5 MG tablet Take 12.5 mg by mouth 2 (two) times daily with a meal.  . escitalopram (LEXAPRO) 10 MG tablet Take 10 mg by mouth daily.  . Insulin Glargine (LANTUS SOLOSTAR) 100 UNIT/ML Solostar Pen Inject 26 Units into the skin daily at 10 pm.  . Misc Natural Products (TART CHERRY ADVANCED PO) Take 1,200 mg by mouth daily.  . Multiple Vitamin (MULTIVITAMIN) tablet Take 1 tablet by mouth daily.  . Multiple Vitamins-Minerals (PRESERVISION AREDS PO) Take 1 capsule by mouth 2 (two) times daily.   . pantoprazole (PROTONIX) 40 MG  tablet Take 1 tablet (40 mg total) by mouth daily.  . quinapril (ACCUPRIL) 20 MG tablet Take 20 mg by mouth every morning.   . [DISCONTINUED] Insulin Glargine (LANTUS SOLOSTAR) 100 UNIT/ML Solostar Pen Inject 26 Units into the skin daily at 10 pm.   No facility-administered encounter medications on file as of 01/23/2018.    ALLERGIES: Allergies  Allergen Reactions  . Amiodarone Nausea Only   VACCINATION STATUS: Immunization History  Administered Date(s) Administered  . Influenza-Unspecified 09/02/2013    Diabetes  He presents for his follow-up diabetic visit. He has type 2 diabetes mellitus. Onset time: He was diagnosed at approximate age of 4 years. His disease course has been stable. There are no hypoglycemic associated symptoms. Pertinent negatives for hypoglycemia include no confusion, headaches, pallor or seizures. There are no diabetic associated symptoms. Pertinent negatives for diabetes include no chest pain, no fatigue, no polydipsia, no polyphagia, no polyuria and no weakness. There are no hypoglycemic complications. Symptoms are stable. Diabetic complications include heart disease. Risk factors for coronary artery disease include dyslipidemia, diabetes mellitus, hypertension and tobacco exposure. He is compliant with treatment most of the time. His weight is decreasing steadily. He is following a generally healthy diet. He has had a previous visit with a dietitian. He participates in exercise intermittently. There is no change in his home blood glucose trend. His breakfast blood glucose range is generally 90-110 mg/dl. His overall blood glucose range is 90-110 mg/dl. Eye exam is current.  Hypertension  This is a chronic problem. The current episode started more than 1 year ago. Pertinent negatives include no chest pain, headaches, neck pain, palpitations or shortness of breath. Risk factors for coronary artery disease include dyslipidemia, diabetes mellitus, male gender, sedentary  lifestyle and smoking/tobacco exposure. Hypertensive end-organ damage includes CAD/MI.  Hyperlipidemia  This is a chronic problem. The current episode started more than 1 year ago. The problem is controlled. Pertinent negatives include no chest pain, myalgias or shortness of breath. Risk factors for coronary artery disease include diabetes mellitus, dyslipidemia, hypertension, a sedentary lifestyle and male sex.     Review of Systems  Constitutional: Negative for fatigue and unexpected weight change.  HENT: Negative for dental problem, mouth sores and trouble swallowing.   Eyes: Negative for visual disturbance.  Respiratory: Negative for cough, choking, chest tightness, shortness of breath and wheezing.   Cardiovascular: Negative for chest pain, palpitations and leg swelling.  Gastrointestinal: Negative for abdominal distention, abdominal pain, constipation, diarrhea, nausea and vomiting.  Endocrine: Negative for polydipsia, polyphagia and polyuria.  Genitourinary: Negative for dysuria, flank pain, hematuria and urgency.  Musculoskeletal: Negative for back pain, gait problem, myalgias and neck pain.  Skin: Negative for pallor, rash and wound.  Neurological: Negative for seizures, syncope, weakness, numbness and headaches.  Psychiatric/Behavioral: Negative.  Negative for  confusion and dysphoric mood.    Objective:    BP 132/71   Pulse 86   Ht 6' (1.829 m)   Wt 190 lb (86.2 kg)   BMI 25.77 kg/m   Wt Readings from Last 3 Encounters:  01/23/18 190 lb (86.2 kg)  12/17/17 196 lb (88.9 kg)  10/23/17 193 lb (87.5 kg)    Physical Exam  Constitutional: He is oriented to person, place, and time. He appears well-developed and well-nourished. He is cooperative. No distress.  HENT:  Head: Normocephalic and atraumatic.  Eyes: EOM are normal.  Neck: Normal range of motion. Neck supple. No tracheal deviation present. No thyromegaly present.  Cardiovascular: Normal rate, S1 normal, S2 normal  and normal heart sounds. Exam reveals no gallop.  No murmur heard. Pulses:      Dorsalis pedis pulses are 1+ on the right side, and 1+ on the left side.       Posterior tibial pulses are 1+ on the right side, and 1+ on the left side.  Pulmonary/Chest: Breath sounds normal. No respiratory distress. He has no wheezes.  Abdominal: Soft. Bowel sounds are normal. He exhibits no distension. There is no tenderness. There is no guarding and no CVA tenderness.  Musculoskeletal: He exhibits no edema.       Right shoulder: He exhibits no swelling and no deformity.  Neurological: He is alert and oriented to person, place, and time. He has normal strength and normal reflexes. No cranial nerve deficit or sensory deficit. Gait normal.  Skin: Skin is warm and dry. No rash noted. No cyanosis. Nails show no clubbing.  Psychiatric: He has a normal mood and affect. His speech is normal and behavior is normal. Judgment and thought content normal. Cognition and memory are normal.    Results for orders placed or performed in visit on 10/23/17  COMPLETE METABOLIC PANEL WITH GFR  Result Value Ref Range   Glucose, Bld 105 (H) 65 - 99 mg/dL   BUN 33 (H) 7 - 25 mg/dL   Creat 1.23 (H) 0.70 - 1.11 mg/dL   GFR, Est Non African American 55 (L) > OR = 60 mL/min/1.23m2   GFR, Est African American 63 > OR = 60 mL/min/1.60m2   BUN/Creatinine Ratio 27 (H) 6 - 22 (calc)   Sodium 142 135 - 146 mmol/L   Potassium 4.9 3.5 - 5.3 mmol/L   Chloride 107 98 - 110 mmol/L   CO2 29 20 - 32 mmol/L   Calcium 9.1 8.6 - 10.3 mg/dL   Total Protein 6.2 6.1 - 8.1 g/dL   Albumin 4.1 3.6 - 5.1 g/dL   Globulin 2.1 1.9 - 3.7 g/dL (calc)   AG Ratio 2.0 1.0 - 2.5 (calc)   Total Bilirubin 0.7 0.2 - 1.2 mg/dL   Alkaline phosphatase (APISO) 81 40 - 115 U/L   AST 17 10 - 35 U/L   ALT 16 9 - 46 U/L  Hemoglobin A1c  Result Value Ref Range   Hgb A1c MFr Bld 7.7 (H) <5.7 % of total Hgb   Mean Plasma Glucose 174 (calc)   eAG (mmol/L) 9.7 (calc)    Complete Blood Count (Most recent): Lab Results  Component Value Date   WBC 5.5 05/04/2016   HGB 10.7 (L) 05/04/2016   HCT 32.5 (L) 05/04/2016   MCV 92.1 05/04/2016   PLT 154 05/04/2016    Diabetic Labs (most recent): Lab Results  Component Value Date   HGBA1C 7.7 (H) 01/13/2018   HGBA1C 7.6 (H)  10/17/2017   HGBA1C 7.5 (H) 07/12/2017   Lipid Panel     Component Value Date/Time   CHOL 133 04/09/2017 0949   TRIG 75 04/09/2017 0949   HDL 44 04/09/2017 0949   CHOLHDL 3.0 04/09/2017 0949   VLDL 15 04/09/2017 0949   LDLCALC 74 04/09/2017 0949    Assessment & Plan:   1. Type 2 diabetes mellitus with vascular disease ([place])  his diabetes is  complicated by coronary artery disease. Patient came with on target fasting glucose profile, and  recent A1c 7.5% improving from 8.1% .   Glucose logs and insulin administration records pertaining to this visit,  to be scanned into patient's records.  - Patient remains at a high risk for more acute and chronic complications of diabetes which include CAD, CVA, CKD, retinopathy, and neuropathy. These are all discussed in detail with the patient.  - I have re-counseled the patient on diet management and   by adopting a carbohydrate restricted / protein rich  Diet. - Patient is advised to stick to a routine mealtimes to eat 3 meals  a day and avoid unnecessary snacks ( to snack only to correct hypoglycemia).  -  Suggestion is made for him to avoid simple carbohydrates  from his diet including Cakes, Sweet Desserts / Pastries, Ice Cream, Soda (diet and regular), Sweet Tea, Candies, Chips, Cookies, Store Bought Juices, Alcohol in Excess of  1-2 drinks a day, Artificial Sweeteners, and "Sugar-free" Products. This will help patient to have stable blood glucose profile and potentially avoid unintended weight gain.  - I have approached patient with the following individualized plan to manage diabetes and patient agrees.  -I will keep him off of   Metformin  for now due to CKD (slowly improving).  -I will continue on Lantus 26 units nightly, monitoring blood glucose 2 times a day-daily before breakfast and at bedtime.    - Target A1c for him would be between 7 and 7.5%.  - Patient specific target  for A1c; LDL, HDL, Triglycerides, and  Waist Circumference were discussed in detail.  2) BP/HTN: His blood pressure is controlled to target.  I advised him to continue current medications including  ACEI. 3) Lipids/HPL: Controlled with LDL of 74, I advised him to continue statins.   4)  Weight/Diet: CDE consult in progress, exercise, and carbohydrates information provided.  5) Chronic Care/Health Maintenance:  -Patient  on ACEI and Statin medications and encouraged to continue to follow up with Ophthalmology, Podiatrist at least yearly or according to recommendations, and advised to  stay away from smoking. I have recommended yearly flu vaccine and pneumonia vaccination at least every 5 years; moderate intensity exercise for up to 150 minutes weekly; and  sleep for at least 7 hours a day.  I advised patient to maintain close follow up with his PCP for primary care needs.  - Time spent with the patient: 25 min, of which >50% was spent in reviewing his blood glucose logs , discussing his hypo- and hyper-glycemic episodes, reviewing his current and  previous labs and insulin doses and developing a plan to avoid hypo- and hyper-glycemia. Please refer to Patient Instructions for Blood Glucose Monitoring and Insulin/Medications Dosing Guide"  in media tab for additional information.   Follow up plan: Return in about 6 months (around 07/23/2018) for meter, and logs.  Glade Lloyd, MD Phone: [phone]  Fax: [phone]   This note was partially dictated with voice recognition software. Similar sounding words can be transcribed inadequately  or may not  be corrected upon review.  01/23/2018, 3:16 PM

## 2018-02-04 DIAGNOSIS — E875 Hyperkalemia: Secondary | ICD-10-CM | POA: Diagnosis not present

## 2018-02-04 DIAGNOSIS — E1122 Type 2 diabetes mellitus with diabetic chronic kidney disease: Secondary | ICD-10-CM | POA: Diagnosis not present

## 2018-02-04 DIAGNOSIS — R809 Proteinuria, unspecified: Secondary | ICD-10-CM | POA: Diagnosis not present

## 2018-02-04 DIAGNOSIS — Y649 Contaminated medical or biological substance administered by unspecified means: Secondary | ICD-10-CM | POA: Diagnosis not present

## 2018-02-04 DIAGNOSIS — I129 Hypertensive chronic kidney disease with stage 1 through stage 4 chronic kidney disease, or unspecified chronic kidney disease: Secondary | ICD-10-CM | POA: Diagnosis not present

## 2018-02-04 DIAGNOSIS — N183 Chronic kidney disease, stage 3 (moderate): Secondary | ICD-10-CM | POA: Diagnosis not present

## 2018-03-18 DIAGNOSIS — E1142 Type 2 diabetes mellitus with diabetic polyneuropathy: Secondary | ICD-10-CM | POA: Diagnosis not present

## 2018-03-18 DIAGNOSIS — B351 Tinea unguium: Secondary | ICD-10-CM | POA: Diagnosis not present

## 2018-05-27 DIAGNOSIS — E1142 Type 2 diabetes mellitus with diabetic polyneuropathy: Secondary | ICD-10-CM | POA: Diagnosis not present

## 2018-05-27 DIAGNOSIS — B351 Tinea unguium: Secondary | ICD-10-CM | POA: Diagnosis not present

## 2018-06-23 DIAGNOSIS — E663 Overweight: Secondary | ICD-10-CM | POA: Diagnosis not present

## 2018-06-23 DIAGNOSIS — H3530 Unspecified macular degeneration: Secondary | ICD-10-CM | POA: Diagnosis not present

## 2018-06-23 DIAGNOSIS — I10 Essential (primary) hypertension: Secondary | ICD-10-CM | POA: Diagnosis not present

## 2018-06-23 DIAGNOSIS — N529 Male erectile dysfunction, unspecified: Secondary | ICD-10-CM | POA: Diagnosis not present

## 2018-06-23 DIAGNOSIS — D1631 Benign neoplasm of short bones of right lower limb: Secondary | ICD-10-CM | POA: Diagnosis not present

## 2018-06-23 DIAGNOSIS — Z6827 Body mass index (BMI) 27.0-27.9, adult: Secondary | ICD-10-CM | POA: Diagnosis not present

## 2018-06-23 DIAGNOSIS — N183 Chronic kidney disease, stage 3 (moderate): Secondary | ICD-10-CM | POA: Diagnosis not present

## 2018-06-23 DIAGNOSIS — M109 Gout, unspecified: Secondary | ICD-10-CM | POA: Diagnosis not present

## 2018-06-23 DIAGNOSIS — E1129 Type 2 diabetes mellitus with other diabetic kidney complication: Secondary | ICD-10-CM | POA: Diagnosis not present

## 2018-06-23 DIAGNOSIS — Z0001 Encounter for general adult medical examination with abnormal findings: Secondary | ICD-10-CM | POA: Diagnosis not present

## 2018-06-24 DIAGNOSIS — Z0001 Encounter for general adult medical examination with abnormal findings: Secondary | ICD-10-CM | POA: Diagnosis not present

## 2018-06-24 DIAGNOSIS — E663 Overweight: Secondary | ICD-10-CM | POA: Diagnosis not present

## 2018-06-24 DIAGNOSIS — D649 Anemia, unspecified: Secondary | ICD-10-CM | POA: Diagnosis not present

## 2018-06-24 DIAGNOSIS — Z1389 Encounter for screening for other disorder: Secondary | ICD-10-CM | POA: Diagnosis not present

## 2018-06-24 DIAGNOSIS — Z6827 Body mass index (BMI) 27.0-27.9, adult: Secondary | ICD-10-CM | POA: Diagnosis not present

## 2018-06-24 DIAGNOSIS — E782 Mixed hyperlipidemia: Secondary | ICD-10-CM | POA: Diagnosis not present

## 2018-06-25 DIAGNOSIS — L570 Actinic keratosis: Secondary | ICD-10-CM | POA: Diagnosis not present

## 2018-06-25 DIAGNOSIS — L400 Psoriasis vulgaris: Secondary | ICD-10-CM | POA: Diagnosis not present

## 2018-07-07 ENCOUNTER — Other Ambulatory Visit: Payer: Self-pay

## 2018-07-07 MED ORDER — BLOOD GLUCOSE MONITOR KIT: PACK | 5 refills | Status: AC

## 2018-07-08 DIAGNOSIS — E119 Type 2 diabetes mellitus without complications: Secondary | ICD-10-CM | POA: Diagnosis not present

## 2018-07-08 DIAGNOSIS — Z794 Long term (current) use of insulin: Secondary | ICD-10-CM | POA: Diagnosis not present

## 2018-07-08 DIAGNOSIS — H353134 Nonexudative age-related macular degeneration, bilateral, advanced atrophic with subfoveal involvement: Secondary | ICD-10-CM | POA: Diagnosis not present

## 2018-07-08 DIAGNOSIS — Z961 Presence of intraocular lens: Secondary | ICD-10-CM | POA: Diagnosis not present

## 2018-07-08 LAB — HM DIABETES EYE EXAM

## 2018-07-11 ENCOUNTER — Other Ambulatory Visit: Payer: Self-pay

## 2018-07-11 MED ORDER — BLOOD GLUCOSE MONITOR KIT: PACK | 5 refills | Status: DC

## 2018-07-15 DIAGNOSIS — E1159 Type 2 diabetes mellitus with other circulatory complications: Secondary | ICD-10-CM | POA: Diagnosis not present

## 2018-07-15 DIAGNOSIS — E119 Type 2 diabetes mellitus without complications: Secondary | ICD-10-CM | POA: Diagnosis not present

## 2018-07-16 LAB — COMPLETE METABOLIC PANEL WITH GFR
AG Ratio: 2.1 (calc) (ref 1.0–2.5)
ALBUMIN MSPROF: 3.9 g/dL (ref 3.6–5.1)
ALT: 14 U/L (ref 9–46)
AST: 15 U/L (ref 10–35)
Alkaline phosphatase (APISO): 82 U/L (ref 40–115)
BUN / CREAT RATIO: 17 (calc) (ref 6–22)
BUN: 22 mg/dL (ref 7–25)
CALCIUM: 8.8 mg/dL (ref 8.6–10.3)
CO2: 29 mmol/L (ref 20–32)
CREATININE: 1.26 mg/dL — AB (ref 0.70–1.11)
Chloride: 108 mmol/L (ref 98–110)
GFR, EST AFRICAN AMERICAN: 61 mL/min/{1.73_m2} (ref >=60)
GFR, EST NON AFRICAN AMERICAN: 53 mL/min/{1.73_m2} — AB (ref >=60)
Globulin: 1.9 g/dL (calc) (ref 1.9–3.7)
Glucose, Bld: 79 mg/dL (ref 65–139)
Potassium: 4.4 mmol/L (ref 3.5–5.3)
Sodium: 141 mmol/L (ref 135–146)
TOTAL PROTEIN: 5.8 g/dL — AB (ref 6.1–8.1)
Total Bilirubin: 0.7 mg/dL (ref 0.2–1.2)

## 2018-07-16 LAB — HEMOGLOBIN A1C
EAG (MMOL/L): 8.9 (calc)
Hgb A1c MFr Bld: 7.2 % of total Hgb — ABNORMAL HIGH (ref <5.7)
MEAN PLASMA GLUCOSE: 160 (calc)

## 2018-07-17 DIAGNOSIS — E113293 Type 2 diabetes mellitus with mild nonproliferative diabetic retinopathy without macular edema, bilateral: Secondary | ICD-10-CM | POA: Diagnosis not present

## 2018-07-17 DIAGNOSIS — H353134 Nonexudative age-related macular degeneration, bilateral, advanced atrophic with subfoveal involvement: Secondary | ICD-10-CM | POA: Diagnosis not present

## 2018-07-17 DIAGNOSIS — H43813 Vitreous degeneration, bilateral: Secondary | ICD-10-CM | POA: Diagnosis not present

## 2018-07-17 DIAGNOSIS — Z961 Presence of intraocular lens: Secondary | ICD-10-CM | POA: Diagnosis not present

## 2018-07-24 ENCOUNTER — Encounter: Payer: Self-pay | Admitting: "Endocrinology

## 2018-07-24 ENCOUNTER — Ambulatory Visit (INDEPENDENT_AMBULATORY_CARE_PROVIDER_SITE_OTHER): Payer: PPO | Admitting: "Endocrinology

## 2018-07-24 VITALS — BP 127/67 | HR 58 | Ht 72.0 in | Wt 191.0 lb

## 2018-07-24 DIAGNOSIS — E782 Mixed hyperlipidemia: Secondary | ICD-10-CM

## 2018-07-24 DIAGNOSIS — I10 Essential (primary) hypertension: Secondary | ICD-10-CM | POA: Diagnosis not present

## 2018-07-24 DIAGNOSIS — E1159 Type 2 diabetes mellitus with other circulatory complications: Secondary | ICD-10-CM

## 2018-07-24 NOTE — Progress Notes (Signed)
Endocrinology follow-up note   Subjective:    Patient ID: Grant [name], male    DOB: [birth year],    Past Medical History:  Diagnosis Date  . Arthritis   . CAD (coronary artery disease)   . Cancer ([place])    skin cancer of forehead  . Diabetes mellitus   . Erectile dysfunction   . GERD (gastroesophageal reflux disease)   . Gout 09-09-13   last flare 8'14 with gallbladder attack at [facility]  . Hyperlipidemia   . Hypertension   . Macular degeneration 09-09-13   bilateral"vision is poor"  . Pancreatitis 10--8-14   8'14 -enzymes improved  . Psoriasis 09-09-13   elbows, knees  . RBBB   . Tubular adenoma    Past Surgical History:  Procedure Laterality Date  . CARDIAC CATHETERIZATION  02/19/2011   3 vessel CAD  . CATARACT EXTRACTION W/PHACO Left 02/14/2016   Procedure: CATARACT EXTRACTION PHACO AND INTRAOCULAR LENS PLACEMENT (IOC);  Surgeon: Rutherford Guys, MD;  Location: [facility];  Service: Ophthalmology;  Laterality: Left;  CDE:6.96  . CATARACT EXTRACTION W/PHACO Right 02/28/2016   Procedure: CATARACT EXTRACTION PHACO AND INTRAOCULAR LENS PLACEMENT (IOC);  Surgeon: Rutherford Guys, MD;  Location: [facility];  Service: Ophthalmology;  Laterality: Right;  CDE:9.54  . CHOLECYSTECTOMY N/A 10/27/2013   Procedure: LAPAROSCOPIC CHOLECYSTECTOMY WITH INTRAOPERATIVE CHOLANGIOGRAM WITH LIVER BIOPSY;  Surgeon: Pedro Earls, MD;  Location: [facility];  Service: General;  Laterality: N/A;  . COLONOSCOPY  02/25/2012   Procedure: COLONOSCOPY;  Surgeon: Daneil Dolin, MD;  Location: [facility];  Service: Endoscopy;  Laterality: N/A;  7:30 AM polyps removed.  . COLONOSCOPY N/A 02/11/2015   Dr.Rourk- multiple colonic polyps bx= tubular adenoma and an inflammatory polyp  . CORONARY ARTERY BYPASS GRAFT  02-28-2011   LIMA to LAD,SVG to acute marginal branch of RCA & sequential SVG to OM1 & OM2.  . ESOPHAGOGASTRODUODENOSCOPY N/A 05/10/2016   RMR: mild schatzki ring, medium sized hh, H.pylori gastritis  . NM MYOCAR  PERF WALL MOTION  02/15/2011   High Risk  . PILONIDAL CYST EXCISION    . US ECHOCARDIOGRAPHY  11/09/2008   mildly impaired diastolic dysfunction - Grade I   Social History   Socioeconomic History  . Marital status: Married    Spouse name: Not on file  . Number of children: Not on file  . Years of education: Not on file  . Highest education level: Not on file  Occupational History  . Not on file  Social Needs  . Financial resource strain: Not on file  . Food insecurity:    Worry: Not on file    Inability: Not on file  . Transportation needs:    Medical: Not on file    Non-medical: Not on file  Tobacco Use  . Smoking status: Former Smoker    Packs/day: 0.50    Years: 25.00    Pack years: 12.50    Types: Cigarettes    Last attempt to quit: 02/25/1983    Years since quitting: 35.4  . Smokeless tobacco: Never Used  Substance and Sexual Activity  . Alcohol use: Yes    Comment: occasional  . Drug use: No  . Sexual activity: Never  Lifestyle  . Physical activity:    Days per week: Not on file    Minutes per session: Not on file  . Stress: Not on file  Relationships  . Social connections:    Talks on phone: Not on file  Gets together: Not on file    Attends religious service: Not on file    Active member of club or organization: Not on file    Attends meetings of clubs or organizations: Not on file    Relationship status: Not on file  Other Topics Concern  . Not on file  Social History Narrative  . Not on file   Outpatient Encounter Medications as of 07/24/2018  Medication Sig  . allopurinol (ZYLOPRIM) 100 MG tablet Take 100 mg by mouth daily with breakfast.   . aspirin EC 81 MG tablet Take 1 tablet (81 mg total) by mouth daily.  Marland Kitchen atorvastatin (LIPITOR) 40 MG tablet Take 40 mg by mouth every evening.   . betamethasone dipropionate (DIPROLENE) 0.05 % cream Apply 1 application topically daily as needed (siriasis).   . blood glucose meter kit and supplies KIT Dispense  based on patient and insurance preference. Use up to 2 times daily as directed. (FOR ICD-10 E11.65) Talking blood glucose meter.  . carvedilol (COREG) 12.5 MG tablet Take 12.5 mg by mouth 2 (two) times daily with a meal.  . escitalopram (LEXAPRO) 10 MG tablet Take 10 mg by mouth daily.  . Insulin Glargine (LANTUS SOLOSTAR) 100 UNIT/ML Solostar Pen Inject 26 Units into the skin daily at 10 pm.  . Misc Natural Products (TART CHERRY ADVANCED PO) Take 1,200 mg by mouth daily.  . Multiple Vitamin (MULTIVITAMIN) tablet Take 1 tablet by mouth daily.  . Multiple Vitamins-Minerals (PRESERVISION AREDS PO) Take 1 capsule by mouth 2 (two) times daily.   . pantoprazole (PROTONIX) 40 MG tablet Take 1 tablet (40 mg total) by mouth daily.  . quinapril (ACCUPRIL) 20 MG tablet Take 20 mg by mouth every morning.   . [DISCONTINUED] blood glucose meter kit and supplies KIT Dispense based on patient and insurance preference. Use up to 2 times daily as directed. (FOR ICD-10 E11.65)   No facility-administered encounter medications on file as of 07/24/2018.    ALLERGIES: Allergies  Allergen Reactions  . Amiodarone Nausea Only   VACCINATION STATUS: Immunization History  Administered Date(s) Administered  . Influenza-Unspecified 09/02/2013    Diabetes  He presents for his follow-up diabetic visit. He has type 2 diabetes mellitus. Onset time: He was diagnosed at approximate age of 30 years. His disease course has been improving. There are no hypoglycemic associated symptoms. Pertinent negatives for hypoglycemia include no confusion, headaches, pallor or seizures. There are no diabetic associated symptoms. Pertinent negatives for diabetes include no chest pain, no fatigue, no polydipsia, no polyphagia, no polyuria and no weakness. There are no hypoglycemic complications. Symptoms are improving. Diabetic complications include heart disease and retinopathy. Risk factors for coronary artery disease include dyslipidemia,  diabetes mellitus, hypertension and tobacco exposure. He is compliant with treatment most of the time. His weight is decreasing steadily. He is following a generally healthy diet. He has had a previous visit with a dietitian. He participates in exercise intermittently. There is no change in his home blood glucose trend. His breakfast blood glucose range is generally 90-110 mg/dl. His overall blood glucose range is 90-110 mg/dl. Eye exam is current.  Hypertension  This is a chronic problem. The current episode started more than 1 year ago. Pertinent negatives include no chest pain, headaches, neck pain, palpitations or shortness of breath. Risk factors for coronary artery disease include dyslipidemia, diabetes mellitus, male gender, sedentary lifestyle and smoking/tobacco exposure. Hypertensive end-organ damage includes CAD/MI and retinopathy.  Hyperlipidemia  This is a chronic problem.  The current episode started more than 1 year ago. The problem is controlled. Pertinent negatives include no chest pain, myalgias or shortness of breath. Risk factors for coronary artery disease include diabetes mellitus, dyslipidemia, hypertension, a sedentary lifestyle and male sex.     Review of Systems  Constitutional: Negative for fatigue and unexpected weight change.  HENT: Negative for dental problem, mouth sores and trouble swallowing.   Eyes: Negative for visual disturbance.  Respiratory: Negative for cough, choking, chest tightness, shortness of breath and wheezing.   Cardiovascular: Negative for chest pain, palpitations and leg swelling.  Gastrointestinal: Negative for abdominal distention, abdominal pain, constipation, diarrhea, nausea and vomiting.  Endocrine: Negative for polydipsia, polyphagia and polyuria.  Genitourinary: Negative for dysuria, flank pain, hematuria and urgency.  Musculoskeletal: Negative for back pain, gait problem, myalgias and neck pain.  Skin: Negative for pallor, rash and wound.   Neurological: Negative for seizures, syncope, weakness, numbness and headaches.  Psychiatric/Behavioral: Negative.  Negative for confusion and dysphoric mood.    Objective:    BP 127/67   Pulse (!) 58   Ht 6' (1.829 m)   Wt 191 lb (86.6 kg)   BMI 25.90 kg/m   Wt Readings from Last 3 Encounters:  07/24/18 191 lb (86.6 kg)  01/23/18 190 lb (86.2 kg)  12/17/17 196 lb (88.9 kg)    Physical Exam  Constitutional: He is oriented to person, place, and time. He appears well-developed and well-nourished. He is cooperative. No distress.  HENT:  Head: Normocephalic and atraumatic.  Eyes: EOM are normal.  Neck: Normal range of motion. Neck supple. No tracheal deviation present. No thyromegaly present.  Cardiovascular: Normal rate, S1 normal, S2 normal and normal heart sounds. Exam reveals no gallop.  No murmur heard. Pulses:      Dorsalis pedis pulses are 1+ on the right side, and 1+ on the left side.       Posterior tibial pulses are 1+ on the right side, and 1+ on the left side.  Pulmonary/Chest: Breath sounds normal. No respiratory distress. He has no wheezes.  Abdominal: Soft. Bowel sounds are normal. He exhibits no distension. There is no tenderness. There is no guarding and no CVA tenderness.  Musculoskeletal: He exhibits no edema.       Right shoulder: He exhibits no swelling and no deformity.  Neurological: He is alert and oriented to person, place, and time. He has normal strength and normal reflexes. No cranial nerve deficit or sensory deficit. Gait normal.  Skin: Skin is warm and dry. No rash noted. No cyanosis. Nails show no clubbing.  Psychiatric: He has a normal mood and affect. His speech is normal and behavior is normal. Judgment and thought content normal. Cognition and memory are normal.    Results for orders placed or performed in visit on 07/08/18  [facility] DIABETES EYE EXAM  Result Value Ref Range   [facility] Diabetic Eye Exam     Complete Blood Count (Most recent): Lab  Results  Component Value Date   WBC 5.5 05/04/2016   HGB 10.7 (L) 05/04/2016   HCT 32.5 (L) 05/04/2016   MCV 92.1 05/04/2016   PLT 154 05/04/2016    Diabetic Labs (most recent): Lab Results  Component Value Date   HGBA1C 7.2 (H) 07/15/2018   HGBA1C 7.7 (H) 01/13/2018   HGBA1C 7.6 (H) 10/17/2017   Lipid Panel     Component Value Date/Time   CHOL 133 04/09/2017 0949   TRIG 75 04/09/2017 0949   HDL 44  04/09/2017 0949   CHOLHDL 3.0 04/09/2017 0949   VLDL 15 04/09/2017 0949   LDLCALC 74 04/09/2017 0949    Assessment & Plan:   1. Type 2 diabetes mellitus with vascular disease ([place])  - His diabetes is  complicated by coronary artery disease. - He came with on target fasting glucose profile, and  recent A1c 7.2% improving from 8.1% .   Glucose logs and insulin administration records pertaining to this visit,  to be scanned into patient's records.  - Patient remains at a high risk for more acute and chronic complications of diabetes which include CAD, CVA, CKD, retinopathy, and neuropathy. These are all discussed in detail with the patient.  - I have re-counseled the patient on diet management and   by adopting a carbohydrate restricted / protein rich  Diet. - Patient is advised to stick to a routine mealtimes to eat 3 meals  a day and avoid unnecessary snacks ( to snack only to correct hypoglycemia).  -  Suggestion is made for him to avoid simple carbohydrates  from his diet including Cakes, Sweet Desserts / Pastries, Ice Cream, Soda (diet and regular), Sweet Tea, Candies, Chips, Cookies, Store Bought Juices, Alcohol in Excess of  1-2 drinks a day, Artificial Sweeteners, and "Sugar-free" Products. This will help patient to have stable blood glucose profile and potentially avoid unintended weight gain.  - I have approached patient with the following individualized plan to manage diabetes and patient agrees.   -I discussed and continued Lantus 26 units nightly,  monitoring blood  glucose 2 times a day-daily before breakfast and at bedtime.    - Target A1c for him would be between 7 and 7.5%.  - Patient specific target  for A1c; LDL, HDL, Triglycerides, and  Waist Circumference were discussed in detail.  2) BP/HTN: His blood pressure is controlled to target.  Advised him to continue lisinopril 20 mg p.o. daily.   3) Lipids/HPL: Recent lipid panel showed controlled with LDL of 74, I advised him to continue atorvastatin 40 mg p.o. nightly.    4)  Weight/Diet: CDE consult in progress, exercise, and carbohydrates information provided.  5) Chronic Care/Health Maintenance:  -Patient  on ACEI and Statin medications and encouraged to continue to follow up with Ophthalmology (he is recently diagnosed with macular degeneration), Podiatrist at least yearly or according to recommendations, and advised to  stay away from smoking. I have recommended yearly flu vaccine and pneumonia vaccination at least every 5 years; moderate intensity exercise for up to 150 minutes weekly; and  sleep for at least 7 hours a day.  I advised patient to maintain close follow up with his PCP for primary care needs.  - Time spent with the patient: 25 min, of which >50% was spent in reviewing his blood glucose logs , discussing his hypo- and hyper-glycemic episodes, reviewing his current and  previous labs and insulin doses and developing a plan to avoid hypo- and hyper-glycemia. Please refer to Patient Instructions for Blood Glucose Monitoring and Insulin/Medications Dosing Guide"  in media tab for additional information. Grant [name] participated in the discussions, expressed understanding, and voiced agreement with the above plans.  All questions were answered to his satisfaction. he is encouraged to contact clinic should he have any questions or concerns prior to his return visit.    Follow up plan: Return in about 6 months (around 01/24/2019) for Follow up with Pre-visit Labs, Meter, and  Logs.  Glade Lloyd, MD Phone: [phone]  Fax:  [phone]   This note was partially dictated with voice recognition software. Similar sounding words can be transcribed inadequately or may not  be corrected upon review.  07/24/2018, 1:55 PM

## 2018-07-24 NOTE — Patient Instructions (Signed)

## 2018-08-05 DIAGNOSIS — E875 Hyperkalemia: Secondary | ICD-10-CM | POA: Diagnosis not present

## 2018-08-05 DIAGNOSIS — E1122 Type 2 diabetes mellitus with diabetic chronic kidney disease: Secondary | ICD-10-CM | POA: Diagnosis not present

## 2018-08-05 DIAGNOSIS — M109 Gout, unspecified: Secondary | ICD-10-CM | POA: Diagnosis not present

## 2018-08-05 DIAGNOSIS — R809 Proteinuria, unspecified: Secondary | ICD-10-CM | POA: Diagnosis not present

## 2018-08-05 DIAGNOSIS — I129 Hypertensive chronic kidney disease with stage 1 through stage 4 chronic kidney disease, or unspecified chronic kidney disease: Secondary | ICD-10-CM | POA: Diagnosis not present

## 2018-08-05 DIAGNOSIS — N183 Chronic kidney disease, stage 3 (moderate): Secondary | ICD-10-CM | POA: Diagnosis not present

## 2018-08-08 DIAGNOSIS — B351 Tinea unguium: Secondary | ICD-10-CM | POA: Diagnosis not present

## 2018-08-08 DIAGNOSIS — E1142 Type 2 diabetes mellitus with diabetic polyneuropathy: Secondary | ICD-10-CM | POA: Diagnosis not present

## 2018-09-02 ENCOUNTER — Ambulatory Visit (INDEPENDENT_AMBULATORY_CARE_PROVIDER_SITE_OTHER): Payer: PPO | Admitting: Urology

## 2018-09-02 DIAGNOSIS — R972 Elevated prostate specific antigen [PSA]: Secondary | ICD-10-CM | POA: Diagnosis not present

## 2018-09-11 ENCOUNTER — Other Ambulatory Visit: Payer: Self-pay | Admitting: "Endocrinology

## 2018-10-09 ENCOUNTER — Encounter: Payer: Self-pay | Admitting: Cardiovascular Disease

## 2018-10-09 ENCOUNTER — Ambulatory Visit (INDEPENDENT_AMBULATORY_CARE_PROVIDER_SITE_OTHER): Payer: PPO | Admitting: Cardiovascular Disease

## 2018-10-09 VITALS — BP 134/54 | HR 55 | Ht 72.0 in | Wt 192.4 lb

## 2018-10-09 DIAGNOSIS — E119 Type 2 diabetes mellitus without complications: Secondary | ICD-10-CM

## 2018-10-09 DIAGNOSIS — E785 Hyperlipidemia, unspecified: Secondary | ICD-10-CM | POA: Diagnosis not present

## 2018-10-09 DIAGNOSIS — I453 Trifascicular block: Secondary | ICD-10-CM

## 2018-10-09 DIAGNOSIS — I10 Essential (primary) hypertension: Secondary | ICD-10-CM | POA: Diagnosis not present

## 2018-10-09 DIAGNOSIS — I2510 Atherosclerotic heart disease of native coronary artery without angina pectoris: Secondary | ICD-10-CM

## 2018-10-09 NOTE — Progress Notes (Signed)
Cardiology Office Note    Date:  10/11/2018   ID:  Grant [name], DOB 04/29/1934, MRN [id number]  PCP:  Grant School, MD  Cardiologist:   Grant Klein, MD   Chief Complaint  Patient presents with  . Coronary Artery Disease    History of Present Illness:  Grant [name] is a 84 y.o. male of coronary artery disease who is now roughly 7 years status post multivessel bypass surgery and also has hyperlipidemia, hypertension, mild diabetes mellitus, history of gout and advanced macular degeneration.   He has no cardiovascular complaints.  His biggest limitation is deteriorating eyesight.  He is able to walk in the yard and even lifts of hand weights to stay fit, but cannot engage in more intense physical activity because of his visual problems.  Glycemic control is a little better with a hemoglobin A1c of 7.2%.  His blood pressure is consistently in normal range.  He has not had dizziness, palpitations, syncope, or dyspnea at rest or with activity, orthopnea or PND or new focal neurological complaints.  Grant [name] underwent four-vessel bypass surgery in 2012 (LIMA to LAD, SVG to acute marginal-RCA, sequential SVG to OM1 and OM 2) by Dr. Roxan [name]. He has treated hyperlipidemia, hypertension, type 2 diabetes mellitus and history of gout. He has a chronic right bundle branch block and in 2015 also developed a left anterior fascicular block and prolonged PR interval at approx. 250 ms.  Past Medical History:  Diagnosis Date  . Arthritis   . CAD (coronary artery disease)   . Cancer ([place])    skin cancer of forehead  . Diabetes mellitus   . Erectile dysfunction   . GERD (gastroesophageal reflux disease)   . Gout 09-09-13   last flare 8'14 with gallbladder attack at [facility]  . Hyperlipidemia   . Hypertension   . Macular degeneration 09-09-13   bilateral"vision is poor"  . Pancreatitis 10--8-14   8'14 -enzymes improved  . Psoriasis 09-09-13   elbows, knees  . RBBB   . Tubular  adenoma     Past Surgical History:  Procedure Laterality Date  . CARDIAC CATHETERIZATION  02/19/2011   3 vessel CAD  . CATARACT EXTRACTION W/PHACO Left 02/14/2016   Procedure: CATARACT EXTRACTION PHACO AND INTRAOCULAR LENS PLACEMENT (IOC);  Surgeon: Grant Guys, MD;  Location: [facility];  Service: Ophthalmology;  Laterality: Left;  CDE:6.96  . CATARACT EXTRACTION W/PHACO Right 02/28/2016   Procedure: CATARACT EXTRACTION PHACO AND INTRAOCULAR LENS PLACEMENT (IOC);  Surgeon: Grant Guys, MD;  Location: [facility];  Service: Ophthalmology;  Laterality: Right;  CDE:9.54  . CHOLECYSTECTOMY N/A 10/27/2013   Procedure: LAPAROSCOPIC CHOLECYSTECTOMY WITH INTRAOPERATIVE CHOLANGIOGRAM WITH LIVER BIOPSY;  Surgeon: Grant Earls, MD;  Location: [facility];  Service: General;  Laterality: N/A;  . COLONOSCOPY  02/25/2012   Procedure: COLONOSCOPY;  Surgeon: Grant Dolin, MD;  Location: [facility];  Service: Endoscopy;  Laterality: N/A;  7:30 AM polyps removed.  . COLONOSCOPY N/A 02/11/2015   Dr.Rourk- multiple colonic polyps bx= tubular adenoma and an inflammatory polyp  . CORONARY ARTERY BYPASS GRAFT  02-28-2011   LIMA to LAD,SVG to acute marginal branch of RCA & sequential SVG to OM1 & OM2.  . ESOPHAGOGASTRODUODENOSCOPY N/A 05/10/2016   RMR: mild schatzki ring, medium sized hh, H.pylori gastritis  . NM MYOCAR PERF WALL MOTION  02/15/2011   High Risk  . PILONIDAL CYST EXCISION    . US ECHOCARDIOGRAPHY  11/09/2008   mildly impaired diastolic dysfunction -  Grade I    Current Medications: Outpatient Medications Prior to Visit  Medication Sig Dispense Refill  . allopurinol (ZYLOPRIM) 100 MG tablet Take 100 mg by mouth daily with breakfast.     . aspirin EC 81 MG tablet Take 1 tablet (81 mg total) by mouth daily.    Marland Kitchen atorvastatin (LIPITOR) 40 MG tablet Take 40 mg by mouth every evening.     . betamethasone dipropionate (DIPROLENE) 0.05 % cream Apply 1 application topically daily as needed (siriasis).   12  .  blood glucose meter kit and supplies KIT Dispense based on patient and insurance preference. Use up to 2 times daily as directed. (FOR ICD-10 E11.65) Talking blood glucose meter. 1 each 5  . carvedilol (COREG) 12.5 MG tablet Take 12.5 mg by mouth 2 (two) times daily with a meal.    . LANTUS SOLOSTAR 100 UNIT/ML Solostar Pen ADMINISTER 26 UNITS UNDER THE SKIN DAILY AT 10 PM 15 mL 2  . Misc Natural Products (TART CHERRY ADVANCED PO) Take 1,200 mg by mouth daily.    . Multiple Vitamin (MULTIVITAMIN) tablet Take 1 tablet by mouth daily.    . Multiple Vitamins-Minerals (PRESERVISION AREDS PO) Take 1 capsule by mouth 2 (two) times daily.     . pantoprazole (PROTONIX) 40 MG tablet Take 1 tablet (40 mg total) by mouth daily. 30 tablet 11  . quinapril (ACCUPRIL) 20 MG tablet Take 20 mg by mouth every morning.     . escitalopram (LEXAPRO) 10 MG tablet Take 10 mg by mouth daily.     No facility-administered medications prior to visit.      Allergies:   Amiodarone   Social History   Socioeconomic History  . Marital status: Married    Spouse name: Not on file  . Number of children: Not on file  . Years of education: Not on file  . Highest education level: Not on file  Occupational History  . Not on file  Social Needs  . Financial resource strain: Not on file  . Food insecurity:    Worry: Not on file    Inability: Not on file  . Transportation needs:    Medical: Not on file    Non-medical: Not on file  Tobacco Use  . Smoking status: Former Smoker    Packs/day: 0.50    Years: 25.00    Pack years: 12.50    Types: Cigarettes    Last attempt to quit: 02/25/1983    Years since quitting: 35.6  . Smokeless tobacco: Never Used  Substance and Sexual Activity  . Alcohol use: Yes    Comment: occasional  . Drug use: No  . Sexual activity: Never  Lifestyle  . Physical activity:    Days per week: Not on file    Minutes per session: Not on file  . Stress: Not on file  Relationships  . Social  connections:    Talks on phone: Not on file    Gets together: Not on file    Attends religious service: Not on file    Active member of club or organization: Not on file    Attends meetings of clubs or organizations: Not on file    Relationship status: Not on file  Other Topics Concern  . Not on file  Social History Narrative  . Not on file     Family History:  The patient's family history includes Cancer in his mother, sister, and sister; Heart failure in his brother and sister; Hypertension  in his sister; Stroke in his father.   ROS:   Please see the history of present illness.    ROS all other systems are reviewed and are negative   PHYSICAL EXAM:   VS:  BP (!) 134/54   Pulse (!) 55   Ht 6' (1.829 m)   Wt 192 lb 6.4 oz (87.3 kg)   BMI 26.09 kg/m      General: Alert, oriented x3, no distress, lean Head: no evidence of trauma, PERRL, EOMI, no exophtalmos or lid lag, no myxedema, no xanthelasma; normal ears, nose and oropharynx Neck: normal jugular venous pulsations and no hepatojugular reflux; brisk carotid pulses without delay and no carotid bruits Chest: clear to auscultation, no signs of consolidation by percussion or palpation, normal fremitus, symmetrical and full respiratory excursions Cardiovascular: normal position and quality of the apical impulse, regular rhythm, normal first and widely split second heart sounds, no murmurs, rubs or gallops Abdomen: no tenderness or distention, no masses by palpation, no abnormal pulsatility or arterial bruits, normal bowel sounds, no hepatosplenomegaly Extremities: no clubbing, cyanosis or edema; 2+ radial, ulnar and brachial pulses bilaterally; 2+ right femoral, posterior tibial and dorsalis pedis pulses; 2+ left femoral, posterior tibial and dorsalis pedis pulses; no subclavian or femoral bruits Neurological: grossly nonfocal Psych: Normal mood and affect    Wt Readings from Last 3 Encounters:  10/09/18 192 lb 6.4 oz (87.3 kg)    07/24/18 191 lb (86.6 kg)  01/23/18 190 lb (86.2 kg)      Studies/Labs Reviewed:   EKG:  EKG is ordered today.  The tracing is very similar to past years and shows sinus bradycardia, first-degree AV block (PR 250 ms), right bundle branch block and left anterior fascicular block (QRS 142 ms), no major repolarization abnormalities, QTC 422 ms  Recent Labs: 07/15/2018: ALT 14; BUN 22; Creat 1.26; Potassium 4.4; Sodium 141   Lipid Panel     Component Value Date/Time   CHOL 133 04/09/2017 0949   TRIG 75 04/09/2017 0949   HDL 44 04/09/2017 0949   CHOLHDL 3.0 04/09/2017 0949   VLDL 15 04/09/2017 0949   LDLCALC 74 04/09/2017 0949   ASSESSMENT:    1. Coronary artery disease involving native coronary artery of native heart without angina pectoris   2. Essential hypertension, benign   3. Dyslipidemia   4. Diabetes mellitus type 2 in nonobese (HCC)   5. Trifascicular block      PLAN:  In order of problems listed above:  1. CAD s/p CABG: 7 years in surgery, asymptomatic. 2. HTN: Excellent control 3. HLP: Acceptable recent lipid profile 4. DM: Improved glycemic control, very close to target 5. Trifascicular block: He is never had syncope or other symptoms of bradycardia to suggest high-grade AV block.  Requested that he report any such symptoms promptly.    Medication Adjustments/Labs and Tests Ordered: Current medicines are reviewed at length with the patient today.  Concerns regarding medicines are outlined above.  Medication changes, Labs and Tests ordered today are listed in the Patient Instructions below. Patient Instructions  Medication Instructions:  Dr Sallyanne Kuster recommends that you continue on your current medications as directed. Please refer to the Current Medication list given to you today.  If you need a refill on your cardiac medications before your next appointment, please call your pharmacy.   Lab work: Your physician recommends that you return for lab work at  your convenience - FASTING.  If you have labs (blood work) drawn today and  your tests are completely normal, you will receive your results only by: Marland Kitchen MyChart Message (if you have MyChart) OR . A paper copy in the mail If you have any lab test that is abnormal or we need to change your treatment, we will call you to review the results.  Follow-Up: At [facility], you and your health needs are our priority.  As part of our continuing mission to provide you with exceptional heart care, we have created designated Provider Care Teams.  These Care Teams include your primary Cardiologist (physician) and Advanced Practice Providers (APPs -  Physician Assistants and Nurse Practitioners) who all work together to provide you with the care you need, when you need it. You will need a follow up appointment in 12 months.  Please call our office 2 months in advance to schedule this appointment.  You may see Grant Klein, MD or one of the following Advanced Practice Providers on your designated Care Team: [place], [place] . Fabian Sharp, PA-C    Signed, Grant Klein, MD  10/11/2018 12:46 PM    [address], [place], [place]  [address] Phone: [phone]; Fax: [phone]

## 2018-10-09 NOTE — Patient Instructions (Signed)
Medication Instructions:  Dr Sallyanne Kuster recommends that you continue on your current medications as directed. Please refer to the Current Medication list given to you today.  If you need a refill on your cardiac medications before your next appointment, please call your pharmacy.   Lab work: Your physician recommends that you return for lab work at your convenience - FASTING.  If you have labs (blood work) drawn today and your tests are completely normal, you will receive your results only by: Marland Kitchen MyChart Message (if you have MyChart) OR . A paper copy in the mail If you have any lab test that is abnormal or we need to change your treatment, we will call you to review the results.  Follow-Up: At [facility], you and your health needs are our priority.  As part of our continuing mission to provide you with exceptional heart care, we have created designated Provider Care Teams.  These Care Teams include your primary Cardiologist (physician) and Advanced Practice Providers (APPs -  Physician Assistants and Nurse Practitioners) who all work together to provide you with the care you need, when you need it. You will need a follow up appointment in 12 months.  Please call our office 2 months in advance to schedule this appointment.  You may see Sanda Klein, MD or one of the following Advanced Practice Providers on your designated Care Team: [place], [place] . Fabian Sharp, PA-C

## 2018-10-11 ENCOUNTER — Encounter: Payer: Self-pay | Admitting: Cardiovascular Disease

## 2018-10-17 DIAGNOSIS — B351 Tinea unguium: Secondary | ICD-10-CM | POA: Diagnosis not present

## 2018-10-17 DIAGNOSIS — E1142 Type 2 diabetes mellitus with diabetic polyneuropathy: Secondary | ICD-10-CM | POA: Diagnosis not present

## 2018-11-20 ENCOUNTER — Encounter: Payer: Self-pay | Admitting: Internal Medicine

## 2018-12-26 DIAGNOSIS — B351 Tinea unguium: Secondary | ICD-10-CM | POA: Diagnosis not present

## 2018-12-26 DIAGNOSIS — E1142 Type 2 diabetes mellitus with diabetic polyneuropathy: Secondary | ICD-10-CM | POA: Diagnosis not present

## 2019-01-14 DIAGNOSIS — E119 Type 2 diabetes mellitus without complications: Secondary | ICD-10-CM | POA: Diagnosis not present

## 2019-01-14 DIAGNOSIS — E782 Mixed hyperlipidemia: Secondary | ICD-10-CM | POA: Diagnosis not present

## 2019-01-14 DIAGNOSIS — E7849 Other hyperlipidemia: Secondary | ICD-10-CM | POA: Diagnosis not present

## 2019-01-14 DIAGNOSIS — E1159 Type 2 diabetes mellitus with other circulatory complications: Secondary | ICD-10-CM | POA: Diagnosis not present

## 2019-01-15 LAB — COMPLETE METABOLIC PANEL WITH GFR
AG Ratio: 1.7 (calc) (ref 1.0–2.5)
ALBUMIN MSPROF: 3.8 g/dL (ref 3.6–5.1)
ALT: 18 U/L (ref 9–46)
AST: 19 U/L (ref 10–35)
Alkaline phosphatase (APISO): 98 U/L (ref 35–144)
BILIRUBIN TOTAL: 0.7 mg/dL (ref 0.2–1.2)
BUN / CREAT RATIO: 22 (calc) (ref 6–22)
BUN: 25 mg/dL (ref 7–25)
CHLORIDE: 105 mmol/L (ref 98–110)
CO2: 30 mmol/L (ref 20–32)
CREATININE: 1.15 mg/dL — AB (ref 0.70–1.11)
Calcium: 8.9 mg/dL (ref 8.6–10.3)
GFR, EST AFRICAN AMERICAN: 68 mL/min/{1.73_m2} (ref >=60)
GFR, Est Non African American: 59 mL/min/{1.73_m2} — ABNORMAL LOW (ref >=60)
GLOBULIN: 2.2 g/dL (ref 1.9–3.7)
GLUCOSE: 105 mg/dL — AB (ref 65–99)
Potassium: 4.5 mmol/L (ref 3.5–5.3)
Sodium: 141 mmol/L (ref 135–146)
TOTAL PROTEIN: 6 g/dL — AB (ref 6.1–8.1)

## 2019-01-15 LAB — HEMOGLOBIN A1C
EAG (MMOL/L): 8.7 (calc)
Hgb A1c MFr Bld: 7.1 % of total Hgb — ABNORMAL HIGH (ref <5.7)
MEAN PLASMA GLUCOSE: 157 (calc)

## 2019-01-15 LAB — LIPID PANEL
CHOLESTEROL: 145 mg/dL (ref <200)
HDL: 45 mg/dL (ref >=40)
LDL CHOLESTEROL (CALC): 84 mg/dL
Non-HDL Cholesterol (Calc): 100 mg/dL (calc) (ref <130)
TRIGLYCERIDES: 75 mg/dL (ref <150)
Total CHOL/HDL Ratio: 3.2 (calc) (ref <5.0)

## 2019-01-15 LAB — T4, FREE: FREE T4: 1.3 ng/dL (ref 0.8–1.8)

## 2019-01-15 LAB — MICROALBUMIN / CREATININE URINE RATIO
CREATININE, URINE: 103 mg/dL (ref 20–320)
MICROALB/CREAT RATIO: 15 ug/mg{creat} (ref <30)
Microalb, Ur: 1.5 mg/dL

## 2019-01-15 LAB — VITAMIN D 25 HYDROXY (VIT D DEFICIENCY, FRACTURES): VIT D 25 HYDROXY: 38 ng/mL (ref 30–100)

## 2019-01-15 LAB — TSH: TSH: 2.59 mIU/L (ref 0.40–4.50)

## 2019-01-21 DIAGNOSIS — Z1389 Encounter for screening for other disorder: Secondary | ICD-10-CM | POA: Diagnosis not present

## 2019-01-21 DIAGNOSIS — Z6828 Body mass index (BMI) 28.0-28.9, adult: Secondary | ICD-10-CM | POA: Diagnosis not present

## 2019-01-21 DIAGNOSIS — E119 Type 2 diabetes mellitus without complications: Secondary | ICD-10-CM | POA: Diagnosis not present

## 2019-01-21 DIAGNOSIS — Z794 Long term (current) use of insulin: Secondary | ICD-10-CM | POA: Diagnosis not present

## 2019-01-21 DIAGNOSIS — I10 Essential (primary) hypertension: Secondary | ICD-10-CM | POA: Diagnosis not present

## 2019-01-21 DIAGNOSIS — Z0001 Encounter for general adult medical examination with abnormal findings: Secondary | ICD-10-CM | POA: Diagnosis not present

## 2019-01-21 DIAGNOSIS — J329 Chronic sinusitis, unspecified: Secondary | ICD-10-CM | POA: Diagnosis not present

## 2019-01-21 DIAGNOSIS — E7849 Other hyperlipidemia: Secondary | ICD-10-CM | POA: Diagnosis not present

## 2019-01-26 ENCOUNTER — Ambulatory Visit (INDEPENDENT_AMBULATORY_CARE_PROVIDER_SITE_OTHER): Payer: PPO | Admitting: "Endocrinology

## 2019-01-26 ENCOUNTER — Encounter: Payer: Self-pay | Admitting: "Endocrinology

## 2019-01-26 ENCOUNTER — Other Ambulatory Visit: Payer: Self-pay

## 2019-01-26 VITALS — BP 168/74 | HR 56 | Temp 97.8°F | Ht 72.0 in | Wt 190.4 lb

## 2019-01-26 DIAGNOSIS — E782 Mixed hyperlipidemia: Secondary | ICD-10-CM

## 2019-01-26 DIAGNOSIS — E1159 Type 2 diabetes mellitus with other circulatory complications: Secondary | ICD-10-CM

## 2019-01-26 DIAGNOSIS — I10 Essential (primary) hypertension: Secondary | ICD-10-CM | POA: Diagnosis not present

## 2019-01-26 NOTE — Progress Notes (Signed)
Endocrinology follow-up note   Subjective:    Patient ID: Grant [name], male    DOB: March 25, 1934,    Past Medical History:  Diagnosis Date  . Arthritis   . CAD (coronary artery disease)   . Cancer ([place])    skin cancer of forehead  . Diabetes mellitus   . Erectile dysfunction   . GERD (gastroesophageal reflux disease)   . Gout 09-09-13   last flare 8'14 with gallbladder attack at [facility]  . Hyperlipidemia   . Hypertension   . Macular degeneration 09-09-13   bilateral"vision is poor"  . Pancreatitis 10--8-14   8'14 -enzymes improved  . Psoriasis 09-09-13   elbows, knees  . RBBB   . Tubular adenoma    Past Surgical History:  Procedure Laterality Date  . CARDIAC CATHETERIZATION  02/19/2011   3 vessel CAD  . CATARACT EXTRACTION W/PHACO Left 02/14/2016   Procedure: CATARACT EXTRACTION PHACO AND INTRAOCULAR LENS PLACEMENT (IOC);  Surgeon: Rutherford Guys, MD;  Location: [facility];  Service: Ophthalmology;  Laterality: Left;  CDE:6.96  . CATARACT EXTRACTION W/PHACO Right 02/28/2016   Procedure: CATARACT EXTRACTION PHACO AND INTRAOCULAR LENS PLACEMENT (IOC);  Surgeon: Rutherford Guys, MD;  Location: [facility];  Service: Ophthalmology;  Laterality: Right;  CDE:9.54  . CHOLECYSTECTOMY N/A 10/27/2013   Procedure: LAPAROSCOPIC CHOLECYSTECTOMY WITH INTRAOPERATIVE CHOLANGIOGRAM WITH LIVER BIOPSY;  Surgeon: Pedro Earls, MD;  Location: [facility];  Service: General;  Laterality: N/A;  . COLONOSCOPY  02/25/2012   Procedure: COLONOSCOPY;  Surgeon: Daneil Dolin, MD;  Location: [facility];  Service: Endoscopy;  Laterality: N/A;  7:30 AM polyps removed.  . COLONOSCOPY N/A 02/11/2015   Dr.Rourk- multiple colonic polyps bx= tubular adenoma and an inflammatory polyp  . CORONARY ARTERY BYPASS GRAFT  02-28-2011   LIMA to LAD,SVG to acute marginal branch of RCA & sequential SVG to OM1 & OM2.  . ESOPHAGOGASTRODUODENOSCOPY N/A 05/10/2016   RMR: mild schatzki ring, medium sized hh, H.pylori gastritis  . NM MYOCAR  PERF WALL MOTION  02/15/2011   High Risk  . PILONIDAL CYST EXCISION    . US ECHOCARDIOGRAPHY  11/09/2008   mildly impaired diastolic dysfunction - Grade I   Social History   Socioeconomic History  . Marital status: Married    Spouse name: Not on file  . Number of children: Not on file  . Years of education: Not on file  . Highest education level: Not on file  Occupational History  . Not on file  Social Needs  . Financial resource strain: Not on file  . Food insecurity:    Worry: Not on file    Inability: Not on file  . Transportation needs:    Medical: Not on file    Non-medical: Not on file  Tobacco Use  . Smoking status: Former Smoker    Packs/day: 0.50    Years: 25.00    Pack years: 12.50    Types: Cigarettes    Last attempt to quit: 02/25/1983    Years since quitting: 35.9  . Smokeless tobacco: Never Used  Substance and Sexual Activity  . Alcohol use: Yes    Comment: occasional  . Drug use: No  . Sexual activity: Never  Lifestyle  . Physical activity:    Days per week: Not on file    Minutes per session: Not on file  . Stress: Not on file  Relationships  . Social connections:    Talks on phone: Not on file  Gets together: Not on file    Attends religious service: Not on file    Active member of club or organization: Not on file    Attends meetings of clubs or organizations: Not on file    Relationship status: Not on file  Other Topics Concern  . Not on file  Social History Narrative  . Not on file   Outpatient Encounter Medications as of 01/26/2019  Medication Sig  . allopurinol (ZYLOPRIM) 100 MG tablet Take 100 mg by mouth daily with breakfast.   . aspirin EC 81 MG tablet Take 1 tablet (81 mg total) by mouth daily.  Marland Kitchen atorvastatin (LIPITOR) 40 MG tablet Take 40 mg by mouth every evening.   . betamethasone dipropionate (DIPROLENE) 0.05 % cream Apply 1 application topically daily as needed (siriasis).   . blood glucose meter kit and supplies KIT Dispense  based on patient and insurance preference. Use up to 2 times daily as directed. (FOR ICD-10 E11.65) Talking blood glucose meter.  . carvedilol (COREG) 12.5 MG tablet Take 12.5 mg by mouth 2 (two) times daily with a meal.  . LANTUS SOLOSTAR 100 UNIT/ML Solostar Pen ADMINISTER 26 UNITS UNDER THE SKIN DAILY AT 10 PM  . Misc Natural Products (TART CHERRY ADVANCED PO) Take 1,200 mg by mouth daily.  . Multiple Vitamin (MULTIVITAMIN) tablet Take 1 tablet by mouth daily.  . Multiple Vitamins-Minerals (PRESERVISION AREDS PO) Take 1 capsule by mouth 2 (two) times daily.   . pantoprazole (PROTONIX) 40 MG tablet Take 1 tablet (40 mg total) by mouth daily.  . quinapril (ACCUPRIL) 20 MG tablet Take 20 mg by mouth every morning.    No facility-administered encounter medications on file as of 01/26/2019.    ALLERGIES: Allergies  Allergen Reactions  . Amiodarone Nausea Only   VACCINATION STATUS: Immunization History  Administered Date(s) Administered  . Influenza-Unspecified 09/02/2013    Diabetes  He presents for his follow-up diabetic visit. He has type 2 diabetes mellitus. Onset time: He was diagnosed at approximate age of 74 years. His disease course has been improving. There are no hypoglycemic associated symptoms. Pertinent negatives for hypoglycemia include no confusion, headaches, pallor or seizures. There are no diabetic associated symptoms. Pertinent negatives for diabetes include no chest pain, no fatigue, no polydipsia, no polyphagia, no polyuria and no weakness. There are no hypoglycemic complications. Symptoms are improving. Diabetic complications include heart disease and retinopathy. Risk factors for coronary artery disease include dyslipidemia, diabetes mellitus, hypertension and tobacco exposure. He is compliant with treatment most of the time. His weight is stable. He is following a generally healthy diet. He has had a previous visit with a dietitian. He participates in exercise  intermittently. His breakfast blood glucose range is generally 90-110 mg/dl. His overall blood glucose range is 90-110 mg/dl. Eye exam is current.  Hypertension  This is a chronic problem. The current episode started more than 1 year ago. Pertinent negatives include no chest pain, headaches, neck pain, palpitations or shortness of breath. Risk factors for coronary artery disease include dyslipidemia, diabetes mellitus, male gender, sedentary lifestyle and smoking/tobacco exposure. Hypertensive end-organ damage includes CAD/MI and retinopathy.  Hyperlipidemia  This is a chronic problem. The current episode started more than 1 year ago. The problem is controlled. Pertinent negatives include no chest pain, myalgias or shortness of breath. Risk factors for coronary artery disease include diabetes mellitus, dyslipidemia, hypertension, a sedentary lifestyle and male sex.     Review of Systems  Constitutional: Negative for fatigue and  unexpected weight change.  HENT: Negative for dental problem, mouth sores and trouble swallowing.   Eyes: Negative for visual disturbance.  Respiratory: Negative for cough, choking, chest tightness, shortness of breath and wheezing.   Cardiovascular: Negative for chest pain, palpitations and leg swelling.  Gastrointestinal: Negative for abdominal distention, abdominal pain, constipation, diarrhea, nausea and vomiting.  Endocrine: Negative for polydipsia, polyphagia and polyuria.  Genitourinary: Negative for dysuria, flank pain, hematuria and urgency.  Musculoskeletal: Negative for back pain, gait problem, myalgias and neck pain.  Skin: Negative for pallor, rash and wound.  Neurological: Negative for seizures, syncope, weakness, numbness and headaches.  Psychiatric/Behavioral: Negative.  Negative for confusion and dysphoric mood.    Objective:    BP (!) 168/74 (BP Location: Right Arm, Patient Position: Sitting, Cuff Size: Normal)   Pulse (!) 56   Temp 97.8 F (36.6  C) (Oral)   Ht 6' (1.829 m)   Wt 190 lb 6.4 oz (86.4 kg)   SpO2 100%   BMI 25.82 kg/m   Wt Readings from Last 3 Encounters:  01/26/19 190 lb 6.4 oz (86.4 kg)  10/09/18 192 lb 6.4 oz (87.3 kg)  07/24/18 191 lb (86.6 kg)    Physical Exam  Constitutional: He is oriented to person, place, and time. He appears well-developed and well-nourished. He is cooperative. No distress.  HENT:  Head: Normocephalic and atraumatic.  Eyes: EOM are normal.  Neck: Normal range of motion. Neck supple. No tracheal deviation present. No thyromegaly present.  Cardiovascular: Normal rate, S1 normal, S2 normal and normal heart sounds. Exam reveals no gallop.  No murmur heard. Pulses:      Dorsalis pedis pulses are 1+ on the right side and 1+ on the left side.       Posterior tibial pulses are 1+ on the right side and 1+ on the left side.  Pulmonary/Chest: Breath sounds normal. No respiratory distress. He has no wheezes.  Abdominal: Soft. Bowel sounds are normal. He exhibits no distension. There is no abdominal tenderness. There is no guarding and no CVA tenderness.  Musculoskeletal:        General: No edema.     Right shoulder: He exhibits no swelling and no deformity.  Neurological: He is alert and oriented to person, place, and time. He has normal strength and normal reflexes. No cranial nerve deficit or sensory deficit. Gait normal.  Skin: Skin is warm and dry. No rash noted. No cyanosis. Nails show no clubbing.  Psychiatric: He has a normal mood and affect. His speech is normal and behavior is normal. Judgment and thought content normal. Cognition and memory are normal.    Results for orders placed or performed in visit on 07/24/18  Hemoglobin A1c  Result Value Ref Range   Hgb A1c MFr Bld 7.1 (H) <5.7 % of total Hgb   Mean Plasma Glucose 157 (calc)   eAG (mmol/L) 8.7 (calc)  COMPLETE METABOLIC PANEL WITH GFR  Result Value Ref Range   Glucose, Bld 105 (H) 65 - 99 mg/dL   BUN 25 7 - 25 mg/dL    Creat 1.15 (H) 0.70 - 1.11 mg/dL   GFR, Est Non African American 59 (L) > OR = 60 mL/min/1.63m   GFR, Est African American 68 > OR = 60 mL/min/1.762m  BUN/Creatinine Ratio 22 6 - 22 (calc)   Sodium 141 135 - 146 mmol/L   Potassium 4.5 3.5 - 5.3 mmol/L   Chloride 105 98 - 110 mmol/L   CO2 30 20 -  32 mmol/L   Calcium 8.9 8.6 - 10.3 mg/dL   Total Protein 6.0 (L) 6.1 - 8.1 g/dL   Albumin 3.8 3.6 - 5.1 g/dL   Globulin 2.2 1.9 - 3.7 g/dL (calc)   AG Ratio 1.7 1.0 - 2.5 (calc)   Total Bilirubin 0.7 0.2 - 1.2 mg/dL   Alkaline phosphatase (APISO) 98 35 - 144 U/L   AST 19 10 - 35 U/L   ALT 18 9 - 46 U/L  Lipid panel  Result Value Ref Range   Cholesterol 145 <200 mg/dL   HDL 45 > OR = 40 mg/dL   Triglycerides 75 <150 mg/dL   LDL Cholesterol (Calc) 84 mg/dL (calc)   Total CHOL/HDL Ratio 3.2 <5.0 (calc)   Non-HDL Cholesterol (Calc) 100 <130 mg/dL (calc)  VITAMIN D 25 Hydroxy (Vit-D Deficiency, Fractures)  Result Value Ref Range   Vit D, 25-Hydroxy 38 30 - 100 ng/mL  TSH  Result Value Ref Range   TSH 2.59 0.40 - 4.50 mIU/L  T4, free  Result Value Ref Range   Free T4 1.3 0.8 - 1.8 ng/dL  Microalbumin / creatinine urine ratio  Result Value Ref Range   Creatinine, Urine 103 20 - 320 mg/dL   Microalb, Ur 1.5 mg/dL   Microalb Creat Ratio 15 <30 mcg/mg creat   Complete Blood Count (Most recent): Lab Results  Component Value Date   WBC 5.5 05/04/2016   HGB 10.7 (L) 05/04/2016   HCT 32.5 (L) 05/04/2016   MCV 92.1 05/04/2016   PLT 154 05/04/2016    Diabetic Labs (most recent): Lab Results  Component Value Date   HGBA1C 7.1 (H) 01/14/2019   HGBA1C 7.2 (H) 07/15/2018   HGBA1C 7.7 (H) 01/13/2018   Lipid Panel     Component Value Date/Time   CHOL 145 01/14/2019 1059   TRIG 75 01/14/2019 1059   HDL 45 01/14/2019 1059   CHOLHDL 3.2 01/14/2019 1059   VLDL 15 04/09/2017 0949   LDLCALC 84 01/14/2019 1059    Assessment & Plan:   1. Type 2 diabetes mellitus with vascular disease  ([place])  - His diabetes is  complicated by coronary artery disease. - He returns with controlled glycemia with A1c of 7.1%.     Glucose logs and insulin administration records pertaining to this visit,  to be scanned into patient's records.  - Patient remains at a high risk for more acute and chronic complications of diabetes which include CAD, CVA, CKD, retinopathy, and neuropathy. These are all discussed in detail with the patient.  - I have re-counseled the patient on diet management and   by adopting a carbohydrate restricted / protein rich  Diet. - Patient is advised to stick to a routine mealtimes to eat 3 meals  a day and avoid unnecessary snacks ( to snack only to correct hypoglycemia).  -  Suggestion is made for him to avoid simple carbohydrates  from his diet including Cakes, Sweet Desserts / Pastries, Ice Cream, Soda (diet and regular), Sweet Tea, Candies, Chips, Cookies, Store Bought Juices, Alcohol in Excess of  1-2 drinks a day, Artificial Sweeteners, and "Sugar-free" Products. This will help patient to have stable blood glucose profile and potentially avoid unintended weight gain.  - I have approached patient with the following individualized plan to manage diabetes and patient agrees.   -He is advised to continue Lantus 26 units nightly, continue monitoring blood glucose 2 times a day-daily before breakfast and at bedtime.    - Target A1c  for him would be between 7 and 7.5%.  - Patient specific target  for A1c; LDL, HDL, Triglycerides, and  Waist Circumference were discussed in detail.  2) BP/HTN: His blood pressure is not controlled to target, 162/71.  He is advised to continue his current medications including  lisinopril 20 mg p.o. daily.   3) Lipids/HPL: Recent lipid panel showed controlled with LDL of 84.  He is advised to continue atorvastatin 40 mg p.o. nightly.   4)  Weight/Diet: CDE consult in progress, exercise, and carbohydrates information provided.  5) Chronic  Care/Health Maintenance:  -Patient  on ACEI and Statin medications and encouraged to continue to follow up with Ophthalmology (he is recently diagnosed with macular degeneration), Podiatrist at least yearly or according to recommendations, and advised to  stay away from smoking. I have recommended yearly flu vaccine and pneumonia vaccination at least every 5 years; moderate intensity exercise for up to 150 minutes weekly; and  sleep for at least 7 hours a day.  I advised patient to maintain close follow up with his PCP for primary care needs.  - Time spent with the patient: 25 min, of which >50% was spent in reviewing his blood glucose logs , discussing his hypoglycemia and hyperglycemia episodes, reviewing his current and  previous labs / studies and medications  doses and developing a plan to avoid hypoglycemia and hyperglycemia. Please refer to Patient Instructions for Blood Glucose Monitoring and Insulin/Medications Dosing Guide"  in media tab for additional information. Grant [name] participated in the discussions, expressed understanding, and voiced agreement with the above plans.  All questions were answered to his satisfaction. he is encouraged to contact clinic should he have any questions or concerns prior to his return visit.  Follow up plan: Return in about 6 months (around 07/27/2019) for Follow up with Pre-visit Labs, Meter, and Logs.  Glade Lloyd, MD Phone: [phone]  Fax: [phone]   This note was partially dictated with voice recognition software. Similar sounding words can be transcribed inadequately or may not  be corrected upon review.  01/26/2019, 1:56 PM

## 2019-02-26 ENCOUNTER — Other Ambulatory Visit: Payer: Self-pay | Admitting: "Endocrinology

## 2019-05-08 DIAGNOSIS — E1142 Type 2 diabetes mellitus with diabetic polyneuropathy: Secondary | ICD-10-CM | POA: Diagnosis not present

## 2019-05-08 DIAGNOSIS — B351 Tinea unguium: Secondary | ICD-10-CM | POA: Diagnosis not present

## 2019-05-19 DIAGNOSIS — Z6827 Body mass index (BMI) 27.0-27.9, adult: Secondary | ICD-10-CM | POA: Diagnosis not present

## 2019-05-19 DIAGNOSIS — R600 Localized edema: Secondary | ICD-10-CM | POA: Diagnosis not present

## 2019-05-19 DIAGNOSIS — E663 Overweight: Secondary | ICD-10-CM | POA: Diagnosis not present

## 2019-06-29 DIAGNOSIS — L570 Actinic keratosis: Secondary | ICD-10-CM | POA: Diagnosis not present

## 2019-07-02 DIAGNOSIS — R972 Elevated prostate specific antigen [PSA]: Secondary | ICD-10-CM | POA: Diagnosis not present

## 2019-07-09 ENCOUNTER — Encounter: Payer: Self-pay | Admitting: "Endocrinology

## 2019-07-09 DIAGNOSIS — Z961 Presence of intraocular lens: Secondary | ICD-10-CM | POA: Diagnosis not present

## 2019-07-09 DIAGNOSIS — H353134 Nonexudative age-related macular degeneration, bilateral, advanced atrophic with subfoveal involvement: Secondary | ICD-10-CM | POA: Diagnosis not present

## 2019-07-09 DIAGNOSIS — E119 Type 2 diabetes mellitus without complications: Secondary | ICD-10-CM | POA: Diagnosis not present

## 2019-07-09 DIAGNOSIS — Z794 Long term (current) use of insulin: Secondary | ICD-10-CM | POA: Diagnosis not present

## 2019-07-09 LAB — HM DIABETES EYE EXAM

## 2019-07-14 ENCOUNTER — Other Ambulatory Visit: Payer: Self-pay

## 2019-07-14 ENCOUNTER — Ambulatory Visit (INDEPENDENT_AMBULATORY_CARE_PROVIDER_SITE_OTHER): Payer: PPO | Admitting: Urology

## 2019-07-14 DIAGNOSIS — R972 Elevated prostate specific antigen [PSA]: Secondary | ICD-10-CM | POA: Diagnosis not present

## 2019-07-17 ENCOUNTER — Other Ambulatory Visit (HOSPITAL_COMMUNITY): Payer: Self-pay | Admitting: Urology

## 2019-07-17 DIAGNOSIS — B351 Tinea unguium: Secondary | ICD-10-CM | POA: Diagnosis not present

## 2019-07-17 DIAGNOSIS — E1142 Type 2 diabetes mellitus with diabetic polyneuropathy: Secondary | ICD-10-CM | POA: Diagnosis not present

## 2019-07-17 DIAGNOSIS — E119 Type 2 diabetes mellitus without complications: Secondary | ICD-10-CM | POA: Diagnosis not present

## 2019-07-17 DIAGNOSIS — E1159 Type 2 diabetes mellitus with other circulatory complications: Secondary | ICD-10-CM | POA: Diagnosis not present

## 2019-07-17 DIAGNOSIS — R972 Elevated prostate specific antigen [PSA]: Secondary | ICD-10-CM

## 2019-07-18 LAB — COMPLETE METABOLIC PANEL WITH GFR
AG Ratio: 1.8 (calc) (ref 1.0–2.5)
ALT: 14 U/L (ref 9–46)
AST: 17 U/L (ref 10–35)
Albumin: 3.8 g/dL (ref 3.6–5.1)
Alkaline phosphatase (APISO): 93 U/L (ref 35–144)
BUN/Creatinine Ratio: 23 (calc) — ABNORMAL HIGH (ref 6–22)
BUN: 30 mg/dL — ABNORMAL HIGH (ref 7–25)
CO2: 28 mmol/L (ref 20–32)
Calcium: 8.8 mg/dL (ref 8.6–10.3)
Chloride: 108 mmol/L (ref 98–110)
Creat: 1.31 mg/dL — ABNORMAL HIGH (ref 0.70–1.11)
GFR, Est African American: 58 mL/min/{1.73_m2} — ABNORMAL LOW (ref >=60)
GFR, Est Non African American: 50 mL/min/{1.73_m2} — ABNORMAL LOW (ref >=60)
Globulin: 2.1 g/dL (calc) (ref 1.9–3.7)
Glucose, Bld: 74 mg/dL (ref 65–99)
Potassium: 4.4 mmol/L (ref 3.5–5.3)
Sodium: 143 mmol/L (ref 135–146)
Total Bilirubin: 0.7 mg/dL (ref 0.2–1.2)
Total Protein: 5.9 g/dL — ABNORMAL LOW (ref 6.1–8.1)

## 2019-07-18 LAB — HEMOGLOBIN A1C
Hgb A1c MFr Bld: 6.8 % of total Hgb — ABNORMAL HIGH (ref <5.7)
Mean Plasma Glucose: 148 (calc)
eAG (mmol/L): 8.2 (calc)

## 2019-07-27 ENCOUNTER — Other Ambulatory Visit: Payer: Self-pay

## 2019-07-27 ENCOUNTER — Encounter: Payer: Self-pay | Admitting: "Endocrinology

## 2019-07-27 ENCOUNTER — Ambulatory Visit (INDEPENDENT_AMBULATORY_CARE_PROVIDER_SITE_OTHER): Payer: PPO | Admitting: "Endocrinology

## 2019-07-27 DIAGNOSIS — E782 Mixed hyperlipidemia: Secondary | ICD-10-CM

## 2019-07-27 DIAGNOSIS — I10 Essential (primary) hypertension: Secondary | ICD-10-CM

## 2019-07-27 DIAGNOSIS — E1159 Type 2 diabetes mellitus with other circulatory complications: Secondary | ICD-10-CM

## 2019-07-27 NOTE — Progress Notes (Signed)
07/27/2019                                                    Endocrinology Telehealth Visit Follow up Note -During COVID -19 Pandemic  This visit type was conducted due to national recommendations for restrictions regarding the COVID-19 Pandemic  in an effort to limit this patient's exposure and mitigate transmission of the corona virus.  Due to his co-morbid illnesses, [name] is at  moderate to high risk for complications without adequate follow up.  This format is felt to be most appropriate for him at this time.  I connected with this patient on 07/27/2019   by telephone and verified that I am speaking with the correct person using two identifiers. Grant [name], [birth year]. he has verbally consented to this visit. All issues noted in this document were discussed and addressed. The format was not optimal for physical exam.     Subjective:    Patient ID: Grant [name], male    DOB: [birth year],    Past Medical History:  Diagnosis Date  . Arthritis   . CAD (coronary artery disease)   . Cancer ([place])    skin cancer of forehead  . Diabetes mellitus   . Erectile dysfunction   . GERD (gastroesophageal reflux disease)   . Gout 09-09-13   last flare 8'14 with gallbladder attack at [facility]  . Hyperlipidemia   . Hypertension   . Macular degeneration 09-09-13   bilateral"vision is poor"  . Pancreatitis 10--8-14   8'14 -enzymes improved  . Psoriasis 09-09-13   elbows, knees  . RBBB   . Tubular adenoma    Past Surgical History:  Procedure Laterality Date  . CARDIAC CATHETERIZATION  02/19/2011   3 vessel CAD  . CATARACT EXTRACTION W/PHACO Left 02/14/2016   Procedure: CATARACT EXTRACTION PHACO AND INTRAOCULAR LENS PLACEMENT (IOC);  Surgeon: Rutherford Guys, MD;  Location: [facility];  Service: Ophthalmology;  Laterality: Left;  CDE:6.96  . CATARACT EXTRACTION W/PHACO Right 02/28/2016   Procedure: CATARACT EXTRACTION PHACO AND INTRAOCULAR LENS PLACEMENT (IOC);  Surgeon: Rutherford Guys, MD;   Location: [facility];  Service: Ophthalmology;  Laterality: Right;  CDE:9.54  . CHOLECYSTECTOMY N/A 10/27/2013   Procedure: LAPAROSCOPIC CHOLECYSTECTOMY WITH INTRAOPERATIVE CHOLANGIOGRAM WITH LIVER BIOPSY;  Surgeon: Pedro Earls, MD;  Location: [facility];  Service: General;  Laterality: N/A;  . COLONOSCOPY  02/25/2012   Procedure: COLONOSCOPY;  Surgeon: Daneil Dolin, MD;  Location: [facility];  Service: Endoscopy;  Laterality: N/A;  7:30 AM polyps removed.  . COLONOSCOPY N/A 02/11/2015   Dr.Rourk- multiple colonic polyps bx= tubular adenoma and an inflammatory polyp  . CORONARY ARTERY BYPASS GRAFT  02-28-2011   LIMA to LAD,SVG to acute marginal branch of RCA & sequential SVG to OM1 & OM2.  . ESOPHAGOGASTRODUODENOSCOPY N/A 05/10/2016   RMR: mild schatzki ring, medium sized hh, H.pylori gastritis  . NM MYOCAR PERF WALL MOTION  02/15/2011   High Risk  . PILONIDAL CYST EXCISION    . US ECHOCARDIOGRAPHY  11/09/2008   mildly impaired diastolic dysfunction - Grade I   Social History   Socioeconomic History  . Marital status: Married    Spouse name: Not on file  . Number of children: Not on file  . Years of education: Not on file  . Highest education  level: Not on file  Occupational History  . Not on file  Social Needs  . Financial resource strain: Not on file  . Food insecurity    Worry: Not on file    Inability: Not on file  . Transportation needs    Medical: Not on file    Non-medical: Not on file  Tobacco Use  . Smoking status: Former Smoker    Packs/day: 0.50    Years: 25.00    Pack years: 12.50    Types: Cigarettes    Quit date: 02/25/1983    Years since quitting: 36.4  . Smokeless tobacco: Never Used  Substance and Sexual Activity  . Alcohol use: Yes    Comment: occasional  . Drug use: No  . Sexual activity: Never  Lifestyle  . Physical activity    Days per week: Not on file    Minutes per session: Not on file  . Stress: Not on file  Relationships  . Social Product manager on phone: Not on file    Gets together: Not on file    Attends religious service: Not on file    Active member of club or organization: Not on file    Attends meetings of clubs or organizations: Not on file    Relationship status: Not on file  Other Topics Concern  . Not on file  Social History Narrative  . Not on file   Outpatient Encounter Medications as of 07/27/2019  Medication Sig  . allopurinol (ZYLOPRIM) 100 MG tablet Take 100 mg by mouth daily with breakfast.   . aspirin EC 81 MG tablet Take 1 tablet (81 mg total) by mouth daily.  Marland Kitchen atorvastatin (LIPITOR) 40 MG tablet Take 40 mg by mouth every evening.   . betamethasone dipropionate (DIPROLENE) 0.05 % cream Apply 1 application topically daily as needed (siriasis).   . blood glucose meter kit and supplies KIT Dispense based on patient and insurance preference. Use up to 2 times daily as directed. (FOR ICD-10 E11.65) Talking blood glucose meter.  . carvedilol (COREG) 12.5 MG tablet Take 12.5 mg by mouth 2 (two) times daily with a meal.  . LANTUS SOLOSTAR 100 UNIT/ML Solostar Pen ADMINISTER 26 UNITS UNDER THE SKIN DAILY AT 10 PM  . Misc Natural Products (TART CHERRY ADVANCED PO) Take 1,200 mg by mouth daily.  . Multiple Vitamin (MULTIVITAMIN) tablet Take 1 tablet by mouth daily.  . Multiple Vitamins-Minerals (PRESERVISION AREDS PO) Take 1 capsule by mouth 2 (two) times daily.   . pantoprazole (PROTONIX) 40 MG tablet Take 1 tablet (40 mg total) by mouth daily.  . quinapril (ACCUPRIL) 20 MG tablet Take 20 mg by mouth every morning.    No facility-administered encounter medications on file as of 07/27/2019.    ALLERGIES: Allergies  Allergen Reactions  . Amiodarone Nausea Only   VACCINATION STATUS: Immunization History  Administered Date(s) Administered  . Influenza-Unspecified 09/02/2013    Diabetes He presents for his follow-up diabetic visit. He has type 2 diabetes mellitus. Onset time: He was diagnosed at  approximate age of 34 years. His disease course has been improving. There are no hypoglycemic associated symptoms. Pertinent negatives for hypoglycemia include no confusion, headaches, pallor or seizures. There are no diabetic associated symptoms. Pertinent negatives for diabetes include no chest pain, no fatigue, no polydipsia, no polyphagia, no polyuria and no weakness. There are no hypoglycemic complications. Symptoms are improving. Diabetic complications include heart disease and retinopathy. Risk factors for coronary artery disease  include dyslipidemia, diabetes mellitus, hypertension and tobacco exposure. He is compliant with treatment most of the time. His weight is stable. He is following a generally healthy diet. He has had a previous visit with a dietitian. He participates in exercise intermittently. His breakfast blood glucose range is generally 110-130 mg/dl. His overall blood glucose range is 110-130 mg/dl. Eye exam is current.  Hypertension This is a chronic problem. The current episode started more than 1 year ago. Pertinent negatives include no chest pain, headaches, neck pain, palpitations or shortness of breath. Risk factors for coronary artery disease include dyslipidemia, diabetes mellitus, male gender, sedentary lifestyle and smoking/tobacco exposure. Hypertensive end-organ damage includes CAD/MI and retinopathy.  Hyperlipidemia This is a chronic problem. The current episode started more than 1 year ago. The problem is controlled. Pertinent negatives include no chest pain, myalgias or shortness of breath. Risk factors for coronary artery disease include diabetes mellitus, dyslipidemia, hypertension, a sedentary lifestyle and male sex.     Review of Systems  Constitutional: Negative for fatigue and unexpected weight change.  HENT: Negative for dental problem, mouth sores and trouble swallowing.   Eyes: Negative for visual disturbance.  Respiratory: Negative for cough, choking, chest  tightness, shortness of breath and wheezing.   Cardiovascular: Negative for chest pain, palpitations and leg swelling.  Gastrointestinal: Negative for abdominal distention, abdominal pain, constipation, diarrhea, nausea and vomiting.  Endocrine: Negative for polydipsia, polyphagia and polyuria.  Genitourinary: Negative for dysuria, flank pain, hematuria and urgency.  Musculoskeletal: Negative for back pain, gait problem, myalgias and neck pain.  Skin: Negative for pallor, rash and wound.  Neurological: Negative for seizures, syncope, weakness, numbness and headaches.  Psychiatric/Behavioral: Negative.  Negative for confusion and dysphoric mood.    Objective:    There were no vitals taken for this visit.  Wt Readings from Last 3 Encounters:  01/26/19 190 lb 6.4 oz (86.4 kg)  10/09/18 192 lb 6.4 oz (87.3 kg)  07/24/18 191 lb (86.6 kg)      Results for orders placed or performed in visit on 07/09/19  [facility] DIABETES EYE EXAM  Result Value Ref Range   [facility] Diabetic Eye Exam No Retinopathy No Retinopathy   Complete Blood Count (Most recent): Lab Results  Component Value Date   WBC 5.5 05/04/2016   HGB 10.7 (L) 05/04/2016   HCT 32.5 (L) 05/04/2016   MCV 92.1 05/04/2016   PLT 154 05/04/2016    Diabetic Labs (most recent): Lab Results  Component Value Date   HGBA1C 6.8 (H) 07/17/2019   HGBA1C 7.1 (H) 01/14/2019   HGBA1C 7.2 (H) 07/15/2018   Lipid Panel     Component Value Date/Time   CHOL 145 01/14/2019 1059   TRIG 75 01/14/2019 1059   HDL 45 01/14/2019 1059   CHOLHDL 3.2 01/14/2019 1059   VLDL 15 04/09/2017 0949   LDLCALC 84 01/14/2019 1059    Assessment & Plan:   1. Type 2 diabetes mellitus with vascular disease (HCC)  - His diabetes is  complicated by coronary artery disease. - He reports near target glycemic profile both fasting and postprandial.  He is previsit labs show A1c of 6.8% improving from 7.1%.  -His recent labs are reviewed with him and his wife who is his  caretaker.  He denies recent hypoglycemia.  - Patient remains at a high risk for more acute and chronic complications of diabetes which include CAD, CVA, CKD, retinopathy, and neuropathy. These are all discussed in detail with the patient.  - I  have re-counseled the patient on diet management and   by adopting a carbohydrate restricted / protein rich  Diet. - Patient is advised to stick to a routine mealtimes to eat 3 meals  a day and avoid unnecessary snacks ( to snack only to correct hypoglycemia).  -  Suggestion is made for him to avoid simple carbohydrates  from his diet including Cakes, Sweet Desserts / Pastries, Ice Cream, Soda (diet and regular), Sweet Tea, Candies, Chips, Cookies, Sweet Pastries,  Store Bought Juices, Alcohol in Excess of  1-2 drinks a day, Artificial Sweeteners, Coffee Creamer, and "Sugar-free" Products. This will help patient to have stable blood glucose profile and potentially avoid unintended weight gain.   - I have approached patient with the following individualized plan to manage diabetes and patient agrees.   -He is advised to continue Lantus 26 units nightly, continue monitoring blood glucose 2 times a day-daily before breakfast and at bedtime.    - Target A1c for him would be between 7 and 7.5%.  - Patient specific target  for A1c; LDL, HDL, Triglycerides, and  Waist Circumference were discussed in detail.  2) BP/HTN: he is advised to home monitor blood pressure and report if > 140/90 on 2 separate readings. Marland Kitchen  He is advised to continue his current medications including  lisinopril 20 mg p.o. daily.   3) Lipids/HPL: Recent lipid panel showed controlled with LDL of 84.  He is advised to continue atorvastatin 40 mg p.o. nightly.     4)  Weight/Diet: CDE consult in progress, exercise, and carbohydrates information provided.  5) Chronic Care/Health Maintenance:  -Patient  on ACEI and Statin medications and encouraged to continue to follow up with  Ophthalmology (he is recently diagnosed with macular degeneration), Podiatrist at least yearly or according to recommendations, and advised to  stay away from smoking. I have recommended yearly flu vaccine and pneumonia vaccination at least every 5 years; moderate intensity exercise for up to 150 minutes weekly; and  sleep for at least 7 hours a day.  I advised patient to maintain close follow up with his PCP for primary care needs.  - Patient Care Time Today:  25 min, of which >50% was spent in  counseling and the rest reviewing his  current and  previous labs/studies, previous treatments, his blood glucose readings, and medications' doses and developing a plan for long-term care based on the latest recommendations for standards of care.   Grant [name] participated in the discussions, expressed understanding, and voiced agreement with the above plans.  All questions were answered to his satisfaction. he is encouraged to contact clinic should he have any questions or concerns prior to his return visit.   Follow up plan: Return in about 6 months (around 01/27/2020), or logs 8, for Bring Meter and Logs- A1c in Office.  Glade Lloyd, MD Phone: [phone]  Fax: [phone]   This note was partially dictated with voice recognition software. Similar sounding words can be transcribed inadequately or may not  be corrected upon review.  07/27/2019, 3:58 PM

## 2019-08-05 ENCOUNTER — Other Ambulatory Visit: Payer: Self-pay | Admitting: "Endocrinology

## 2019-08-18 ENCOUNTER — Other Ambulatory Visit: Payer: Self-pay | Admitting: Urology

## 2019-08-18 ENCOUNTER — Other Ambulatory Visit: Payer: Self-pay

## 2019-08-18 ENCOUNTER — Ambulatory Visit (HOSPITAL_COMMUNITY)
Admission: RE | Admit: 2019-08-18 | Discharge: 2019-08-18 | Disposition: A | Discharge status: Home / Self Care | Payer: PPO | Source: Ambulatory Visit | Attending: Urology | Admitting: Urology

## 2019-08-18 DIAGNOSIS — R972 Elevated prostate specific antigen [PSA]: Secondary | ICD-10-CM | POA: Diagnosis not present

## 2019-08-18 MED ORDER — GENTAMICIN SULFATE 40 MG/ML IJ SOLN
INTRAMUSCULAR | Status: AC
  Filled 2019-08-18: qty 4

## 2019-08-18 MED ORDER — GENTAMICIN SULFATE 40 MG/ML IJ SOLN
160.0000 mg | Freq: Once | INTRAMUSCULAR | Status: AC
  Administered 2019-08-18: 12:00:00 160 mg via INTRAMUSCULAR

## 2019-08-18 MED ORDER — LIDOCAINE HCL (PF) 2 % IJ SOLN
INTRAMUSCULAR | Status: AC
  Filled 2019-08-18: qty 10

## 2019-08-27 ENCOUNTER — Other Ambulatory Visit (HOSPITAL_COMMUNITY): Payer: Self-pay | Admitting: Urology

## 2019-08-27 DIAGNOSIS — C61 Malignant neoplasm of prostate: Secondary | ICD-10-CM

## 2019-09-09 ENCOUNTER — Other Ambulatory Visit: Payer: Self-pay

## 2019-09-09 ENCOUNTER — Ambulatory Visit (HOSPITAL_COMMUNITY)
Admission: RE | Admit: 2019-09-09 | Discharge: 2019-09-09 | Disposition: A | Discharge status: Home / Self Care | Payer: PPO | Source: Ambulatory Visit | Attending: Urology | Admitting: Urology

## 2019-09-09 DIAGNOSIS — C61 Malignant neoplasm of prostate: Secondary | ICD-10-CM | POA: Insufficient documentation

## 2019-09-09 LAB — POCT I-STAT CREATININE: Creatinine, Ser: 1.3 mg/dL — ABNORMAL HIGH (ref 0.61–1.24)

## 2019-09-09 MED ORDER — IOHEXOL 300 MG/ML  SOLN
100.0000 mL | Freq: Once | INTRAMUSCULAR | Status: AC | PRN
  Administered 2019-09-09: 16:00:00 100 mL via INTRAVENOUS

## 2019-09-21 DIAGNOSIS — Z23 Encounter for immunization: Secondary | ICD-10-CM | POA: Diagnosis not present

## 2019-09-25 DIAGNOSIS — E1142 Type 2 diabetes mellitus with diabetic polyneuropathy: Secondary | ICD-10-CM | POA: Diagnosis not present

## 2019-09-25 DIAGNOSIS — B351 Tinea unguium: Secondary | ICD-10-CM | POA: Diagnosis not present

## 2019-10-13 ENCOUNTER — Other Ambulatory Visit: Payer: Self-pay | Admitting: "Endocrinology

## 2019-11-03 ENCOUNTER — Telehealth: Payer: Self-pay | Admitting: Cardiovascular Disease

## 2019-11-03 NOTE — Telephone Encounter (Signed)
Noted thank you

## 2019-11-03 NOTE — Telephone Encounter (Signed)
New Message:   Wife called and said  She will need to come in with pt for his appointmentt tomorrow with Dr C. She says pt is visionally impaired. a

## 2019-11-04 ENCOUNTER — Other Ambulatory Visit: Payer: Self-pay

## 2019-11-04 ENCOUNTER — Ambulatory Visit (INDEPENDENT_AMBULATORY_CARE_PROVIDER_SITE_OTHER): Payer: PPO | Admitting: Cardiovascular Disease

## 2019-11-04 ENCOUNTER — Encounter: Payer: Self-pay | Admitting: Cardiovascular Disease

## 2019-11-04 VITALS — BP 122/60 | HR 72 | Temp 97.0°F | Ht 72.0 in | Wt 184.0 lb

## 2019-11-04 DIAGNOSIS — E119 Type 2 diabetes mellitus without complications: Secondary | ICD-10-CM

## 2019-11-04 DIAGNOSIS — I453 Trifascicular block: Secondary | ICD-10-CM | POA: Diagnosis not present

## 2019-11-04 DIAGNOSIS — I10 Essential (primary) hypertension: Secondary | ICD-10-CM

## 2019-11-04 DIAGNOSIS — I2510 Atherosclerotic heart disease of native coronary artery without angina pectoris: Secondary | ICD-10-CM | POA: Diagnosis not present

## 2019-11-04 DIAGNOSIS — E7800 Pure hypercholesterolemia, unspecified: Secondary | ICD-10-CM | POA: Diagnosis not present

## 2019-11-04 NOTE — Patient Instructions (Signed)

## 2019-11-04 NOTE — Progress Notes (Signed)
Cardiology Office Note    Date:  11/06/2019   ID:  DONTE LENZO, DOB Mar 02, 1934, MRN [id number]  PCP:  Redmond School, MD  Cardiologist:   Sanda Klein, MD   Chief Complaint  Patient presents with  . Follow-up    History of Present Illness:  Grant [name] is a 85 y.o. male of coronary artery disease who is now roughly 8 years status post multivessel bypass surgery and also has hyperlipidemia, hypertension, mild diabetes mellitus, history of gout and advanced macular degeneration.   He has no overt cardiovascular complaints.  He was recently diagnosed with prostate cancer and is waiting to hear from his urologist, Dr. Diona Fanti about the possible treatment plan (it sounds like he will receive radiation).  Visual problems continues to steadily deteriorate.  He is not able to read or watch television but can generally recognize familiar faces.  Glycemic control is good with a hemoglobin A1c of 6.8%.  His blood pressure is usually in normal range. The patient specifically denies any chest pain at rest exertion, dyspnea at rest or with exertion, orthopnea, paroxysmal nocturnal dyspnea, syncope, palpitations, focal neurological deficits, intermittent claudication, lower extremity edema, unexplained weight gain, cough, hemoptysis or wheezing.  Grant [name] underwent four-vessel bypass surgery in 2012 (LIMA to LAD, SVG to acute marginal-RCA, sequential SVG to OM1 and OM 2) by Dr. Roxan Hockey. He has treated hyperlipidemia, hypertension, type 2 diabetes mellitus and history of gout. He has a chronic right bundle branch block and in 2015 also developed a left anterior fascicular block and prolonged PR interval at approx. 250 ms.  Past Medical History:  Diagnosis Date  . Arthritis   . CAD (coronary artery disease)   . Cancer (Gas)    skin cancer of forehead, Prostate  . Diabetes mellitus   . Erectile dysfunction   . GERD (gastroesophageal reflux disease)   . Gout 09-09-13   last flare  8'14 with gallbladder attack at [facility]  . Hyperlipidemia   . Hypertension   . Macular degeneration 09-09-13   bilateral"vision is poor"  . Pancreatitis 10--8-14   8'14 -enzymes improved  . Psoriasis 09-09-13   elbows, knees  . RBBB   . Tubular adenoma     Past Surgical History:  Procedure Laterality Date  . CARDIAC CATHETERIZATION  02/19/2011   3 vessel CAD  . CATARACT EXTRACTION W/PHACO Left 02/14/2016   Procedure: CATARACT EXTRACTION PHACO AND INTRAOCULAR LENS PLACEMENT (IOC);  Surgeon: Rutherford Guys, MD;  Location: [facility];  Service: Ophthalmology;  Laterality: Left;  CDE:6.96  . CATARACT EXTRACTION W/PHACO Right 02/28/2016   Procedure: CATARACT EXTRACTION PHACO AND INTRAOCULAR LENS PLACEMENT (IOC);  Surgeon: Rutherford Guys, MD;  Location: [facility];  Service: Ophthalmology;  Laterality: Right;  CDE:9.54  . CHOLECYSTECTOMY N/A 10/27/2013   Procedure: LAPAROSCOPIC CHOLECYSTECTOMY WITH INTRAOPERATIVE CHOLANGIOGRAM WITH LIVER BIOPSY;  Surgeon: Pedro Earls, MD;  Location: [facility];  Service: General;  Laterality: N/A;  . COLONOSCOPY  02/25/2012   Procedure: COLONOSCOPY;  Surgeon: Daneil Dolin, MD;  Location: [facility];  Service: Endoscopy;  Laterality: N/A;  7:30 AM polyps removed.  . COLONOSCOPY N/A 02/11/2015   Dr.Rourk- multiple colonic polyps bx= tubular adenoma and an inflammatory polyp  . CORONARY ARTERY BYPASS GRAFT  02-28-2011   LIMA to LAD,SVG to acute marginal branch of RCA & sequential SVG to OM1 & OM2.  . ESOPHAGOGASTRODUODENOSCOPY N/A 05/10/2016   RMR: mild schatzki ring, medium sized hh, H.pylori gastritis  . NM MYOCAR PERF WALL  MOTION  02/15/2011   High Risk  . PILONIDAL CYST EXCISION    . US ECHOCARDIOGRAPHY  11/09/2008   mildly impaired diastolic dysfunction - Grade I    Current Medications: Outpatient Medications Prior to Visit  Medication Sig Dispense Refill  . allopurinol (ZYLOPRIM) 100 MG tablet Take 100 mg by mouth daily with breakfast.     . aspirin EC 81 MG tablet  Take 1 tablet (81 mg total) by mouth daily.    Marland Kitchen atorvastatin (LIPITOR) 40 MG tablet Take 40 mg by mouth every evening.     . betamethasone dipropionate (DIPROLENE) 0.05 % cream Apply 1 application topically daily as needed (siriasis).   12  . blood glucose meter kit and supplies KIT Dispense based on patient and insurance preference. Use up to 2 times daily as directed. (FOR ICD-10 E11.65) Talking blood glucose meter. 1 each 5  . carvedilol (COREG) 12.5 MG tablet Take 12.5 mg by mouth 2 (two) times daily with a meal.    . EASYMAX TEST test strip USE TO TEST TWICE DAILY. 100 strip 5  . LANTUS SOLOSTAR 100 UNIT/ML Solostar Pen ADMINISTER 26 UNITS UNDER THE SKIN DAILY AT 10 PM 15 mL 2  . Misc Natural Products (TART CHERRY ADVANCED PO) Take 1,200 mg by mouth daily.    . Multiple Vitamin (MULTIVITAMIN) tablet Take 1 tablet by mouth daily.    . Multiple Vitamins-Minerals (PRESERVISION AREDS PO) Take 1 capsule by mouth 2 (two) times daily.     . pantoprazole (PROTONIX) 40 MG tablet Take 1 tablet (40 mg total) by mouth daily. 30 tablet 11  . quinapril (ACCUPRIL) 20 MG tablet Take 20 mg by mouth every morning.      No facility-administered medications prior to visit.      Allergies:   Amiodarone   Social History   Socioeconomic History  . Marital status: Married    Spouse name: Not on file  . Number of children: Not on file  . Years of education: Not on file  . Highest education level: Not on file  Occupational History  . Not on file  Social Needs  . Financial resource strain: Not on file  . Food insecurity    Worry: Not on file    Inability: Not on file  . Transportation needs    Medical: Not on file    Non-medical: Not on file  Tobacco Use  . Smoking status: Former Smoker    Packs/day: 0.50    Years: 25.00    Pack years: 12.50    Types: Cigarettes    Quit date: 02/25/1983    Years since quitting: 36.7  . Smokeless tobacco: Never Used  Substance and Sexual Activity  . Alcohol  use: Yes    Comment: occasional  . Drug use: No  . Sexual activity: Never  Lifestyle  . Physical activity    Days per week: Not on file    Minutes per session: Not on file  . Stress: Not on file  Relationships  . Social Herbalist on phone: Not on file    Gets together: Not on file    Attends religious service: Not on file    Active member of club or organization: Not on file    Attends meetings of clubs or organizations: Not on file    Relationship status: Not on file  Other Topics Concern  . Not on file  Social History Narrative  . Not on file  Family History:  The patient's family history includes Cancer in his mother, sister, and sister; Heart failure in his brother and sister; Hypertension in his sister; Stroke in his father.   ROS:   Please see the history of present illness.    ROS All other systems are reviewed and are negative.   PHYSICAL EXAM:   VS:  BP 122/60 (BP Location: Left Arm, Patient Position: Sitting, Cuff Size: Normal)   Pulse 72   Temp (!) 97 F (36.1 C)   Ht 6' (1.829 m)   Wt 184 lb (83.5 kg)   BMI 24.95 kg/m      General: Alert, oriented x3, no distress,  Head: no evidence of trauma, PERRL, EOMI, no exophtalmos or lid lag, no myxedema, no xanthelasma; normal ears, nose and oropharynx Neck: normal jugular venous pulsations and no hepatojugular reflux; brisk carotid pulses without delay and no carotid bruits Chest: clear to auscultation, no signs of consolidation by percussion or palpation, normal fremitus, symmetrical and full respiratory excursions Cardiovascular: normal position and quality of the apical impulse, regular rhythm, normal first and widely split second heart sounds, no murmurs, rubs or gallops Abdomen: no tenderness or distention, no masses by palpation, no abnormal pulsatility or arterial bruits, normal bowel sounds, no hepatosplenomegaly Extremities: no clubbing, cyanosis or edema; 2+ radial, ulnar and brachial pulses  bilaterally; 2+ right femoral, posterior tibial and dorsalis pedis pulses; 2+ left femoral, posterior tibial and dorsalis pedis pulses; no subclavian or femoral bruits Neurological: grossly nonfocal Psych: Normal mood and affect    Wt Readings from Last 3 Encounters:  11/04/19 184 lb (83.5 kg)  01/26/19 190 lb 6.4 oz (86.4 kg)  10/09/18 192 lb 6.4 oz (87.3 kg)      Studies/Labs Reviewed:   EKG:  EKG is ordered today.  The tracing is very similar to past years and shows sinus rhythm with trifascicular block (PR interval 234 ms, right bundle branch block, left anterior fascicular block).  No ischemic repolarization abnormalities, QTC 444 ms  Recent Labs: 01/14/2019: TSH 2.59 07/17/2019: ALT 14; BUN 30; Potassium 4.4; Sodium 143 09/09/2019: Creatinine, Ser 1.30   Lipid Panel     Component Value Date/Time   CHOL 145 01/14/2019 1059   TRIG 75 01/14/2019 1059   HDL 45 01/14/2019 1059   CHOLHDL 3.2 01/14/2019 1059   VLDL 15 04/09/2017 0949   LDLCALC 84 01/14/2019 1059   ASSESSMENT:    1. Coronary artery disease involving native coronary artery of native heart without angina pectoris   2. Essential hypertension   3. Hypercholesterolemia   4. Diabetes mellitus type 2 in nonobese (HCC)   5. Trifascicular block      PLAN:  In order of problems listed above:  1. CAD s/p CABG: Asymptomatic 8 years after bypass surgery. 2. HTN: Very well controlled 3. HLP: All lipid parameters are in target range or close to it.  LDL is a little high at 84 (ideal target less than 70). 4. DM: Glycemic control 5. Trifascicular block: He denies syncope or other symptoms of high-grade AV block.    Medication Adjustments/Labs and Tests Ordered: Current medicines are reviewed at length with the patient today.  Concerns regarding medicines are outlined above.  Medication changes, Labs and Tests ordered today are listed in the Patient Instructions below. Patient Instructions  Medication Instructions:   No changes *If you need a refill on your cardiac medications before your next appointment, please call your pharmacy*  Lab Work: None ordered If you  have labs (blood work) drawn today and your tests are completely normal, you will receive your results only by: Marland Kitchen MyChart Message (if you have MyChart) OR . A paper copy in the mail If you have any lab test that is abnormal or we need to change your treatment, we will call you to review the results.  Testing/Procedures: None ordered  Follow-Up: At [facility], you and your health needs are our priority.  As part of our continuing mission to provide you with exceptional heart care, we have created designated Provider Care Teams.  These Care Teams include your primary Cardiologist (physician) and Advanced Practice Providers (APPs -  Physician Assistants and Nurse Practitioners) who all work together to provide you with the care you need, when you need it.  Your next appointment:   12 month(s)  The format for your next appointment:   In Person  Provider:   Sanda Klein, MD      Signed, Sanda Klein, MD  11/06/2019 8:49 PM    [address], [place], [place]  [address] Phone: [phone]; Fax: [phone]

## 2019-11-05 ENCOUNTER — Other Ambulatory Visit (HOSPITAL_COMMUNITY): Payer: Self-pay | Admitting: Urology

## 2019-11-05 DIAGNOSIS — C61 Malignant neoplasm of prostate: Secondary | ICD-10-CM

## 2019-11-06 ENCOUNTER — Encounter (HOSPITAL_COMMUNITY)
Admission: RE | Admit: 2019-11-06 | Discharge: 2019-11-06 | Disposition: A | Discharge status: Home / Self Care | Payer: PPO | Source: Ambulatory Visit | Attending: Urology | Admitting: Urology

## 2019-11-06 ENCOUNTER — Encounter (HOSPITAL_COMMUNITY): Payer: Self-pay

## 2019-11-06 ENCOUNTER — Other Ambulatory Visit: Payer: Self-pay

## 2019-11-06 DIAGNOSIS — C61 Malignant neoplasm of prostate: Secondary | ICD-10-CM | POA: Diagnosis not present

## 2019-11-06 DIAGNOSIS — R972 Elevated prostate specific antigen [PSA]: Secondary | ICD-10-CM | POA: Diagnosis not present

## 2019-11-06 MED ORDER — TECHNETIUM TC 99M MEDRONATE IV KIT
20.0000 | PACK | Freq: Once | INTRAVENOUS | Status: AC | PRN
  Administered 2019-11-06: 09:00:00 19.4 via INTRAVENOUS

## 2019-11-12 ENCOUNTER — Encounter: Payer: Self-pay | Admitting: *Deleted

## 2019-11-16 ENCOUNTER — Encounter: Payer: Self-pay | Admitting: Radiation Oncology

## 2019-11-16 NOTE — Progress Notes (Signed)
GU Location of Tumor / Histology: prostatic adenocarcinoma  If Prostate Cancer, Gleason Score is (4 + 3) and PSA is (9.0). Prostate volume: 21 grams.    Biopsies of prostate (if applicable) revealed:   Past/Anticipated interventions by urology, if any: prostate biopsy, CT abd/pelvis negative, Bone scan negative, referral to Dr. Tammi Klippel  Past/Anticipated interventions by medical oncology, if any: no  Weight changes, if any: no  Bowel/Bladder complaints, if any: IPSS 6. SHIM 1. Denies dysuria, hematuria, leakage or incontinence. Reports occasional constipation.    Nausea/Vomiting, if any: no  Pain issues, if any:  denies  SAFETY ISSUES:  Prior radiation? denies  Pacemaker/ICD? denies  Possible current pregnancy? no, male patient  Is the patient on methotrexate? denies  Current Complaints / other details:  85 year old male. Married with one grown son. Retired. Former smoker.

## 2019-11-17 ENCOUNTER — Ambulatory Visit
Admission: RE | Admit: 2019-11-17 | Discharge: 2019-11-17 | Disposition: A | Discharge status: Home / Self Care | Payer: PPO | Source: Ambulatory Visit | Attending: Radiation Oncology | Admitting: Radiation Oncology

## 2019-11-17 ENCOUNTER — Other Ambulatory Visit: Payer: Self-pay

## 2019-11-17 ENCOUNTER — Encounter: Payer: Self-pay | Admitting: Radiation Oncology

## 2019-11-17 VITALS — Ht 72.0 in | Wt 184.0 lb

## 2019-11-17 DIAGNOSIS — C61 Malignant neoplasm of prostate: Secondary | ICD-10-CM | POA: Insufficient documentation

## 2019-11-17 DIAGNOSIS — R972 Elevated prostate specific antigen [PSA]: Secondary | ICD-10-CM | POA: Diagnosis not present

## 2019-11-17 HISTORY — DX: Unspecified malignant neoplasm of skin, unspecified: C44.90

## 2019-11-17 HISTORY — DX: Malignant neoplasm of prostate: C61

## 2019-11-17 NOTE — Progress Notes (Signed)
Radiation Oncology         [phone] ________________________________  Initial outpatient Consultation - Conducted via Telephone due to current COVID-19 concerns for limiting patient exposure  Name: Grant [name] MRN: [id number]  Date: 11/17/2019  DOB: [birth year]  CC:Redmond School, MD  Franchot Gallo, MD   REFERRING PHYSICIAN: Franchot Gallo, MD  DIAGNOSIS: 85 y.o. gentleman with Stage T1c adenocarcinoma of the prostate with Gleason score of 4+3, and PSA of 9.    ICD-10-CM   1. Malignant neoplasm of prostate ([place])  C61     HISTORY OF PRESENT ILLNESS: Grant [name] is a 84 y.o. male with a diagnosis of prostate cancer. He has a longstanding history of elevated PSA and was initially seen by Dr. Diona Fanti in 03/2015 for a PSA of 6.14.  DRE at that time was without any concerning findings so the decision was to continue to monitor the PSA. He returned to Dr. Mikey Bussing on 06/24/2018 with a PSA of 7.8 and stable DRE and a repeat PSA on 07/02/2019 was further elevated at 9.  The patient proceeded to transrectal ultrasound with 12 biopsies of the prostate on 08/18/2019.  The prostate volume measured 21 cc.  Out of 12 core biopsies, 4 were positive.  The maximum Gleason score was 4+3, and this was seen in the left mid lateral and left apex lateral. Additionally, Gleason 3+4 was seen in the left apex and right mid.  He underwent imaging for disease staging with a CT A/P on 09/09/2019, which showed no signs of metastatic disease. A bone scan was performed on 11/06/2019 and was negative for osseous metastatic disease.  The patient reviewed the biopsy results with his urologist and he has kindly been referred today for discussion of potential radiation treatment options.  PREVIOUS RADIATION THERAPY: No  PAST MEDICAL HISTORY:  Past Medical History:  Diagnosis Date  . Arthritis   . CAD (coronary artery disease)   . Diabetes mellitus   . Erectile dysfunction   . GERD (gastroesophageal  reflux disease)   . Gout 09-09-13   last flare 8'14 with gallbladder attack at [facility]  . Hyperlipidemia   . Hypertension   . Macular degeneration 09-09-13   bilateral"vision is poor"  . Pancreatitis 10--8-14   8'14 -enzymes improved  . Prostate cancer ([place])   . Psoriasis 09-09-13   elbows, knees  . RBBB   . Skin cancer    forehead  . Tubular adenoma       PAST SURGICAL HISTORY: Past Surgical History:  Procedure Laterality Date  . CARDIAC CATHETERIZATION  02/19/2011   3 vessel CAD  . CATARACT EXTRACTION W/PHACO Left 02/14/2016   Procedure: CATARACT EXTRACTION PHACO AND INTRAOCULAR LENS PLACEMENT (IOC);  Surgeon: Rutherford Guys, MD;  Location: [facility];  Service: Ophthalmology;  Laterality: Left;  CDE:6.96  . CATARACT EXTRACTION W/PHACO Right 02/28/2016   Procedure: CATARACT EXTRACTION PHACO AND INTRAOCULAR LENS PLACEMENT (IOC);  Surgeon: Rutherford Guys, MD;  Location: [facility];  Service: Ophthalmology;  Laterality: Right;  CDE:9.54  . CHOLECYSTECTOMY N/A 10/27/2013   Procedure: LAPAROSCOPIC CHOLECYSTECTOMY WITH INTRAOPERATIVE CHOLANGIOGRAM WITH LIVER BIOPSY;  Surgeon: Pedro Earls, MD;  Location: [facility];  Service: General;  Laterality: N/A;  . COLONOSCOPY  02/25/2012   Procedure: COLONOSCOPY;  Surgeon: Daneil Dolin, MD;  Location: [facility];  Service: Endoscopy;  Laterality: N/A;  7:30 AM polyps removed.  . COLONOSCOPY N/A 02/11/2015   Dr.Rourk- multiple colonic polyps bx= tubular adenoma and an inflammatory polyp  .  CORONARY ARTERY BYPASS GRAFT  02-28-2011   LIMA to LAD,SVG to acute marginal branch of RCA & sequential SVG to OM1 & OM2.  . ESOPHAGOGASTRODUODENOSCOPY N/A 05/10/2016   RMR: mild schatzki ring, medium sized hh, H.pylori gastritis  . NM MYOCAR PERF WALL MOTION  02/15/2011   High Risk  . PILONIDAL CYST EXCISION    . US ECHOCARDIOGRAPHY  11/09/2008   mildly impaired diastolic dysfunction - Grade I    FAMILY HISTORY:  Family History  Problem Relation Age of Onset  . Stroke  Father   . Leukemia Mother   . Heart failure Brother   . Pancreatic cancer Sister   . Hypertension Sister   . Breast cancer Sister   . Heart failure Sister   . Anesthesia problems Neg Hx   . Hypotension Neg Hx   . Malignant hyperthermia Neg Hx   . Pseudochol deficiency Neg Hx   . Colon cancer Neg Hx   . Prostate cancer Neg Hx     SOCIAL HISTORY:  Social History   Socioeconomic History  . Marital status: Married    Spouse name: Not on file  . Number of children: 1  . Years of education: Not on file  . Highest education level: Not on file  Occupational History    Comment: retired  Tobacco Use  . Smoking status: Former Smoker    Packs/day: 0.50    Years: 25.00    Pack years: 12.50    Types: Cigarettes    Quit date: 02/25/1983    Years since quitting: 36.7  . Smokeless tobacco: Never Used  Substance and Sexual Activity  . Alcohol use: Yes    Comment: occasional  . Drug use: No  . Sexual activity: Not Currently  Other Topics Concern  . Not on file  Social History Narrative  . Not on file   Social Determinants of Health   Financial Resource Strain:   . Difficulty of Paying Living Expenses: Not on file  Food Insecurity:   . Worried About Charity fundraiser in the Last Year: Not on file  . Ran Out of Food in the Last Year: Not on file  Transportation Needs:   . Lack of Transportation (Medical): Not on file  . Lack of Transportation (Non-Medical): Not on file  Physical Activity:   . Days of Exercise per Week: Not on file  . Minutes of Exercise per Session: Not on file  Stress:   . Feeling of Stress : Not on file  Social Connections:   . Frequency of Communication with Friends and Family: Not on file  . Frequency of Social Gatherings with Friends and Family: Not on file  . Attends Religious Services: Not on file  . Active Member of Clubs or Organizations: Not on file  . Attends Archivist Meetings: Not on file  . Marital Status: Not on file  Intimate  Partner Violence:   . Fear of Current or Ex-Partner: Not on file  . Emotionally Abused: Not on file  . Physically Abused: Not on file  . Sexually Abused: Not on file    ALLERGIES: Amiodarone  MEDICATIONS:  Current Outpatient Medications  Medication Sig Dispense Refill  . allopurinol (ZYLOPRIM) 100 MG tablet Take 100 mg by mouth daily with breakfast.     . aspirin EC 81 MG tablet Take 1 tablet (81 mg total) by mouth daily.    Marland Kitchen atorvastatin (LIPITOR) 40 MG tablet Take 40 mg by mouth every evening.     Marland Kitchen  betamethasone dipropionate (DIPROLENE) 0.05 % cream Apply 1 application topically daily as needed (siriasis).   12  . blood glucose meter kit and supplies KIT Dispense based on patient and insurance preference. Use up to 2 times daily as directed. (FOR ICD-10 E11.65) Talking blood glucose meter. 1 each 5  . carvedilol (COREG) 12.5 MG tablet Take 12.5 mg by mouth 2 (two) times daily with a meal.    . EASYMAX TEST test strip USE TO TEST TWICE DAILY. 100 strip 5  . LANTUS SOLOSTAR 100 UNIT/ML Solostar Pen ADMINISTER 26 UNITS UNDER THE SKIN DAILY AT 10 PM 15 mL 2  . Misc Natural Products (TART CHERRY ADVANCED PO) Take 1,200 mg by mouth daily.    . Multiple Vitamin (MULTIVITAMIN) tablet Take 1 tablet by mouth daily.    . Multiple Vitamins-Minerals (PRESERVISION AREDS PO) Take 1 capsule by mouth 2 (two) times daily.     . pantoprazole (PROTONIX) 40 MG tablet Take 1 tablet (40 mg total) by mouth daily. 30 tablet 11  . quinapril (ACCUPRIL) 20 MG tablet Take 20 mg by mouth every morning.      No current facility-administered medications for this encounter.    REVIEW OF SYSTEMS:  On review of systems, the patient reports that he is doing well overall. He denies any chest pain, shortness of breath, cough, fevers, chills, night sweats, unintended weight changes. He denies any bowel disturbances, and denies abdominal pain, nausea or vomiting. He does report occasional constipation. He denies any new  musculoskeletal or joint aches or pains. His IPSS was 6, indicating mild urinary symptoms. His SHIM was 1, indicating he has severe erectile dysfunction. A complete review of systems is obtained and is otherwise negative.    PHYSICAL EXAM:  Wt Readings from Last 3 Encounters:  11/17/19 184 lb (83.5 kg)  11/04/19 184 lb (83.5 kg)  01/26/19 190 lb 6.4 oz (86.4 kg)   Temp Readings from Last 3 Encounters:  11/04/19 (!) 97 F (36.1 C)  01/26/19 97.8 F (36.6 C) (Oral)  12/17/17 (!) 97.5 F (36.4 C) (Oral)   BP Readings from Last 3 Encounters:  11/04/19 122/60  01/26/19 (!) 168/74  10/09/18 (!) 134/54   Pulse Readings from Last 3 Encounters:  11/04/19 72  01/26/19 (!) 56  10/09/18 (!) 55   Pain Assessment Pain Score: 0-No pain/10  Physical exam not performed in light of telephone consult visit format.   KPS = 90  100 - Normal; no complaints; no evidence of disease. 90   - Able to carry on normal activity; minor signs or symptoms of disease. 80   - Normal activity with effort; some signs or symptoms of disease. 70   - Cares for self; unable to carry on normal activity or to do active work. 60   - Requires occasional assistance, but is able to care for most of his personal needs. 50   - Requires considerable assistance and frequent medical care. 58   - Disabled; requires special care and assistance. 75   - Severely disabled; hospital admission is indicated although death not imminent. 82   - Very sick; hospital admission necessary; active supportive treatment necessary. 10   - Moribund; fatal processes progressing rapidly. 0     - Dead  Karnofsky DA, Abelmann WH, Craver LS and Burchenal [facility] [phone]) The use of the nitrogen mustards in the palliative treatment of carcinoma: with particular reference to bronchogenic carcinoma Cancer 1 [phone]  LABORATORY DATA:  Lab Results  Component  Value Date   WBC 5.5 05/04/2016   HGB 10.7 (L) 05/04/2016   HCT 32.5 (L) 05/04/2016   MCV  92.1 05/04/2016   PLT 154 05/04/2016   Lab Results  Component Value Date   NA 143 07/17/2019   K 4.4 07/17/2019   CL 108 07/17/2019   CO2 28 07/17/2019   Lab Results  Component Value Date   ALT 14 07/17/2019   AST 17 07/17/2019   ALKPHOS 92 07/12/2017   BILITOT 0.7 07/17/2019     RADIOGRAPHY: NM Bone Scan Whole Body  Result Date: 11/06/2019 CLINICAL DATA:  Elevated PSA EXAM: NUCLEAR MEDICINE WHOLE BODY BONE SCAN TECHNIQUE: Whole body anterior and posterior images were obtained approximately 3 hours after intravenous injection of radiopharmaceutical. RADIOPHARMACEUTICALS:  19.4 mCi Technetium-81mMDP IV COMPARISON:  CT abdomen/pelvis 09/09/2019 FINDINGS: There is no suspicious areas of focal radiotracer uptake. Injection site noted at the left antecubital fossa. Physiologic excretion of radiopharmaceutical within the kidneys and urinary bladder. IMPRESSION: Whole body bone scan negative for osseous metastatic disease. Electronically Signed   By: NDavina PokeM.D.   On: 11/06/2019 16:39      IMPRESSION/PLAN: This visit was conducted via Telephone to spare the patient unnecessary potential exposure in the healthcare setting during the current COVID-19 pandemic. 1. 85y.o. gentleman with Stage T1c adenocarcinoma of the prostate with Gleason Score of 4+3, and PSA of 9. We discussed the patient's workup and outlined the nature of prostate cancer in this setting. The patient's T stage, Gleason's score, and PSA put him into the unfavorable intermediate risk group. Accordingly, he is eligible for a variety of potential treatment options including brachytherapy or 5.5 weeks of external radiation. We discussed the available radiation techniques, and focused on the details and logistics and delivery. We discussed and outlined the risks, benefits, short and long-term effects associated with radiotherapy and compared and contrasted these with prostatectomy. We discussed the role of SpaceOAR in  reducing the rectal toxicity associated with radiotherapy. He and his wife were encouraged to ask questions that were answered to their stated satisfaction.  At the end of the conversation the patient is interested in moving forward with external beam radiation. He appears to have a good understanding of his disease and our treatment recommendations which are of curative intent.  He was [place] unfortunately, [facility] so [place].  We will begin coordinating for fiducial markers and SpaceOAR gel with Dr. DDiona Fanti first available[facility] a 5.5 week course of prostate IMRT in the near future. He is with and in agreement with this plan. We will share our discussion with Dr. DDiona Fantiand move forward with treatment planning accordingly.  Given current concerns for patient exposure during the COVID-19 pandemic, this encounter was conducted via telephone. The patient was notified in advance and was offered a MyChart meeting to allow for face to face communication but unfortunately reported that he did not have the appropriate resources/technology to support such a visit and instead preferred to proceed with telephone consult. The patient has given verbal consent for this type of encounter. The time spent during this encounter was 60 minutes. The attendants for this meeting include MTyler PitaMD, Ashlyn Bruning PA-C, KProctor patient, WSHEFFIELD HAWKERand his wife. During the encounter, MRodman Key  Tammi Klippel MD, Ashlyn Bruning PA-C, and scribe, Wilburn Mylar were located at [facility].  Patient, Grant [name] and his wife were located at home.    Nicholos Johns, PA-C    Tyler Pita, MD  [facility] Direct Dial: [phone]  Fax: [phone] [email or web address]  Skype  LinkedIn  This document serves as a record of services personally performed by Tyler Pita, MD and Freeman Caldron, PA-C. It was created on their behalf by Wilburn Mylar, a trained medical scribe. The creation of this record is based on the scribe's personal observations and the provider's statements to them. This document has been checked and approved by the attending provider.

## 2019-11-17 NOTE — Progress Notes (Signed)
See progress note under physician encounter. 

## 2019-11-17 NOTE — Progress Notes (Signed)
I spoke with Grant [name] and gave him his in-network vs. out of network benefits for radiation therapy and he said he would rather come here but wanted to talk to his wife when she gets home.  I also told him we have a grant to help with gas cards and other bills to ease some financial burden if they decide to come here.  He said he would call us back with his decision.  I gave him my phone number if he had any other questions.

## 2019-11-24 ENCOUNTER — Telehealth: Payer: Self-pay | Admitting: Medical Oncology

## 2019-11-24 NOTE — Telephone Encounter (Signed)
Patient returned my call and I introduced myself to him and his wife, as the prostate nurse navigator and discussed my role. I was unable to meet him the day he consulted with Dr. Tammi Klippel. He states the consult went well and was very pleased. He had hoped to go to [facility] to get radiation treatments, but [facility] is out of network for him. Meredith Hobgood did speak with him to discuss help with gas cards and paying bills to ease the financial burden. We discussed the next step will be placement of fiducial markers and SpaceOar. He goes to Dr. Diona Fanti in the [place] office, but is willing to come to [facility] if he can get an earlier appointment. I will share this with Enid Derry. He is aware Enid Derry will contact him to arrange appointments. I gave him my contact information, and encouraged him to call with questions or concerns. He voiced understanding and was very appreciative of the call.

## 2019-11-24 NOTE — Telephone Encounter (Signed)
Attempted to reach patient, to follow up post radiation consult,  but unsuccessful. Will try later.

## 2019-12-03 ENCOUNTER — Telehealth: Payer: Self-pay | Admitting: *Deleted

## 2019-12-03 NOTE — Telephone Encounter (Signed)
Returned patient's phone call, spoke with patient 

## 2019-12-08 ENCOUNTER — Telehealth: Payer: Self-pay | Admitting: *Deleted

## 2019-12-08 NOTE — Telephone Encounter (Signed)
Called patient to inform that he doesn't need an ADT, therefore the appt. for 12-11-19 with [facility] has been cancelled, lvm for a return call

## 2019-12-08 NOTE — Telephone Encounter (Signed)
Called patient to inform of ADT appt. for 12-11-19- arrival time-1:45 pm @ Dr. [facility] Office, spoke with patient and he is aware of this appt.

## 2019-12-23 ENCOUNTER — Telehealth: Payer: Self-pay | Admitting: *Deleted

## 2019-12-23 ENCOUNTER — Other Ambulatory Visit: Payer: Self-pay | Admitting: Urology

## 2019-12-23 DIAGNOSIS — C61 Malignant neoplasm of prostate: Secondary | ICD-10-CM

## 2019-12-23 NOTE — Telephone Encounter (Signed)
CALLED PATIENT TO REMIND OF FID. MARKER AND SPACE OAR PLACEMENT FOR 12-24-19 @ [facility] AND HIS SIM APPT. ON 01-01-20 - ARRIVAL TIME- 10:45 AM @ [facility], SPOKE WITH PATIENT AND HE IS AWARE OF THESE APPTS.

## 2019-12-24 DIAGNOSIS — C61 Malignant neoplasm of prostate: Secondary | ICD-10-CM | POA: Diagnosis not present

## 2019-12-31 ENCOUNTER — Telehealth: Payer: Self-pay | Admitting: *Deleted

## 2019-12-31 ENCOUNTER — Other Ambulatory Visit: Payer: Self-pay | Admitting: Urology

## 2019-12-31 MED ORDER — LORAZEPAM 1 MG PO TABS: 1.0000 mg | ORAL_TABLET | ORAL | 0 refills | Status: DC | PRN

## 2019-12-31 NOTE — Telephone Encounter (Signed)
CALLED PATIENT TO REMIND OF SIM AND MRI APPT. FOR 01-01-20, SPOKE WITH PATIENT AND HE IS AWARE OF THESE APPTS.

## 2019-12-31 NOTE — Progress Notes (Signed)
  Radiation Oncology         [phone] ________________________________  Name: Grant [name] MRN: [id number]  Date: 01/01/2020  DOB: 06-Jun-1934  SIMULATION AND TREATMENT PLANNING NOTE    ICD-10-CM   1. Malignant neoplasm of prostate ([place])  C61     DIAGNOSIS: 86 y.o. gentleman with Stage T1c adenocarcinoma of the prostate with Gleason score of 4+3, and PSA of 9.  NARRATIVE:  The patient was brought to the [facility] [facility].  Identity was confirmed.  All relevant records and images related to the planned course of therapy were reviewed.  The patient freely provided informed written consent to proceed with treatment after reviewing the details related to the planned course of therapy. The consent form was witnessed and verified by the simulation staff.  Then, the patient was set-up in a stable reproducible supine position for radiation therapy.  A vacuum lock pillow device was custom fabricated to position his legs in a reproducible immobilized position.  Then, I performed a urethrogram under sterile conditions to identify the prostatic apex.  CT images were obtained.  Surface markings were placed.  The CT images were loaded into the planning software.  Then the prostate target and avoidance structures including the rectum, bladder, bowel and hips were contoured.  Treatment planning then occurred.  The radiation prescription was entered and confirmed.  A total of one complex treatment devices was fabricated. I have requested : Intensity Modulated Radiotherapy (IMRT) is medically necessary for this case for the following reason:  Rectal sparing.Marland Kitchen  PLAN:  The patient will receive 70 Gy in 28 fractions.  ________________________________  Sheral Apley Tammi Klippel, M.D.

## 2020-01-01 ENCOUNTER — Encounter: Payer: Self-pay | Admitting: Medical Oncology

## 2020-01-01 ENCOUNTER — Other Ambulatory Visit: Payer: Self-pay

## 2020-01-01 ENCOUNTER — Ambulatory Visit (HOSPITAL_COMMUNITY)
Admission: RE | Admit: 2020-01-01 | Discharge: 2020-01-01 | Disposition: A | Discharge status: Home / Self Care | Payer: PPO | Source: Ambulatory Visit | Attending: Urology | Admitting: Urology

## 2020-01-01 ENCOUNTER — Ambulatory Visit
Admission: RE | Admit: 2020-01-01 | Discharge: 2020-01-01 | Disposition: A | Discharge status: Home / Self Care | Payer: PPO | Source: Ambulatory Visit | Attending: Radiation Oncology | Admitting: Radiation Oncology

## 2020-01-01 DIAGNOSIS — C61 Malignant neoplasm of prostate: Secondary | ICD-10-CM | POA: Insufficient documentation

## 2020-01-01 DIAGNOSIS — Z510 Encounter for antineoplastic radiation therapy: Secondary | ICD-10-CM | POA: Diagnosis not present

## 2020-01-05 DIAGNOSIS — C61 Malignant neoplasm of prostate: Secondary | ICD-10-CM | POA: Diagnosis not present

## 2020-01-05 DIAGNOSIS — Z510 Encounter for antineoplastic radiation therapy: Secondary | ICD-10-CM | POA: Diagnosis not present

## 2020-01-12 ENCOUNTER — Other Ambulatory Visit: Payer: Self-pay

## 2020-01-12 ENCOUNTER — Ambulatory Visit
Admission: RE | Admit: 2020-01-12 | Discharge: 2020-01-12 | Disposition: A | Discharge status: Home / Self Care | Payer: PPO | Source: Ambulatory Visit | Attending: Radiation Oncology | Admitting: Radiation Oncology

## 2020-01-12 ENCOUNTER — Encounter: Payer: Self-pay | Admitting: Medical Oncology

## 2020-01-12 DIAGNOSIS — Z510 Encounter for antineoplastic radiation therapy: Secondary | ICD-10-CM | POA: Diagnosis not present

## 2020-01-12 DIAGNOSIS — C61 Malignant neoplasm of prostate: Secondary | ICD-10-CM | POA: Diagnosis not present

## 2020-01-13 ENCOUNTER — Ambulatory Visit
Admission: RE | Admit: 2020-01-13 | Discharge: 2020-01-13 | Disposition: A | Discharge status: Home / Self Care | Payer: PPO | Source: Ambulatory Visit | Attending: Radiation Oncology | Admitting: Radiation Oncology

## 2020-01-13 ENCOUNTER — Other Ambulatory Visit: Payer: Self-pay

## 2020-01-13 DIAGNOSIS — C61 Malignant neoplasm of prostate: Secondary | ICD-10-CM | POA: Diagnosis not present

## 2020-01-13 DIAGNOSIS — Z510 Encounter for antineoplastic radiation therapy: Secondary | ICD-10-CM | POA: Diagnosis not present

## 2020-01-14 ENCOUNTER — Other Ambulatory Visit: Payer: Self-pay

## 2020-01-14 ENCOUNTER — Ambulatory Visit
Admission: RE | Admit: 2020-01-14 | Discharge: 2020-01-14 | Disposition: A | Discharge status: Home / Self Care | Payer: PPO | Source: Ambulatory Visit | Attending: Radiation Oncology | Admitting: Radiation Oncology

## 2020-01-14 DIAGNOSIS — C61 Malignant neoplasm of prostate: Secondary | ICD-10-CM | POA: Diagnosis not present

## 2020-01-14 DIAGNOSIS — Z510 Encounter for antineoplastic radiation therapy: Secondary | ICD-10-CM | POA: Diagnosis not present

## 2020-01-15 ENCOUNTER — Ambulatory Visit
Admission: RE | Admit: 2020-01-15 | Discharge: 2020-01-15 | Disposition: A | Discharge status: Home / Self Care | Payer: PPO | Source: Ambulatory Visit | Attending: Radiation Oncology | Admitting: Radiation Oncology

## 2020-01-15 ENCOUNTER — Other Ambulatory Visit: Payer: Self-pay

## 2020-01-15 DIAGNOSIS — C61 Malignant neoplasm of prostate: Secondary | ICD-10-CM | POA: Diagnosis not present

## 2020-01-15 DIAGNOSIS — Z510 Encounter for antineoplastic radiation therapy: Secondary | ICD-10-CM | POA: Diagnosis not present

## 2020-01-15 MED ORDER — LANTUS SOLOSTAR 100 UNIT/ML ~~LOC~~ SOPN: PEN_INJECTOR | SUBCUTANEOUS | 2 refills | Status: DC

## 2020-01-18 ENCOUNTER — Ambulatory Visit: Payer: PPO

## 2020-01-19 ENCOUNTER — Other Ambulatory Visit: Payer: Self-pay

## 2020-01-19 ENCOUNTER — Ambulatory Visit
Admission: RE | Admit: 2020-01-19 | Discharge: 2020-01-19 | Disposition: A | Discharge status: Home / Self Care | Payer: PPO | Source: Ambulatory Visit | Attending: Radiation Oncology | Admitting: Radiation Oncology

## 2020-01-19 DIAGNOSIS — Z510 Encounter for antineoplastic radiation therapy: Secondary | ICD-10-CM | POA: Diagnosis not present

## 2020-01-19 DIAGNOSIS — C61 Malignant neoplasm of prostate: Secondary | ICD-10-CM | POA: Diagnosis not present

## 2020-01-20 ENCOUNTER — Ambulatory Visit
Admission: RE | Admit: 2020-01-20 | Discharge: 2020-01-20 | Disposition: A | Discharge status: Home / Self Care | Payer: PPO | Source: Ambulatory Visit | Attending: Radiation Oncology | Admitting: Radiation Oncology

## 2020-01-20 DIAGNOSIS — Z510 Encounter for antineoplastic radiation therapy: Secondary | ICD-10-CM | POA: Diagnosis not present

## 2020-01-20 DIAGNOSIS — C61 Malignant neoplasm of prostate: Secondary | ICD-10-CM | POA: Diagnosis not present

## 2020-01-21 ENCOUNTER — Ambulatory Visit: Payer: PPO

## 2020-01-22 ENCOUNTER — Other Ambulatory Visit: Payer: Self-pay

## 2020-01-22 ENCOUNTER — Ambulatory Visit
Admission: RE | Admit: 2020-01-22 | Discharge: 2020-01-22 | Disposition: A | Discharge status: Home / Self Care | Payer: PPO | Source: Ambulatory Visit | Attending: Radiation Oncology | Admitting: Radiation Oncology

## 2020-01-22 ENCOUNTER — Other Ambulatory Visit: Payer: Self-pay | Admitting: Radiation Oncology

## 2020-01-22 DIAGNOSIS — Z510 Encounter for antineoplastic radiation therapy: Secondary | ICD-10-CM | POA: Diagnosis not present

## 2020-01-22 DIAGNOSIS — C61 Malignant neoplasm of prostate: Secondary | ICD-10-CM | POA: Diagnosis not present

## 2020-01-22 MED ORDER — TAMSULOSIN HCL 0.4 MG PO CAPS: 0.4000 mg | ORAL_CAPSULE | Freq: Every day | ORAL | 5 refills | Status: DC

## 2020-01-25 ENCOUNTER — Ambulatory Visit
Admission: RE | Admit: 2020-01-25 | Discharge: 2020-01-25 | Disposition: A | Discharge status: Home / Self Care | Payer: PPO | Source: Ambulatory Visit | Attending: Radiation Oncology | Admitting: Radiation Oncology

## 2020-01-25 ENCOUNTER — Other Ambulatory Visit: Payer: Self-pay

## 2020-01-25 DIAGNOSIS — Z510 Encounter for antineoplastic radiation therapy: Secondary | ICD-10-CM | POA: Diagnosis not present

## 2020-01-25 DIAGNOSIS — C61 Malignant neoplasm of prostate: Secondary | ICD-10-CM | POA: Diagnosis not present

## 2020-01-26 ENCOUNTER — Ambulatory Visit
Admission: RE | Admit: 2020-01-26 | Discharge: 2020-01-26 | Disposition: A | Discharge status: Home / Self Care | Payer: PPO | Source: Ambulatory Visit | Attending: Radiation Oncology | Admitting: Radiation Oncology

## 2020-01-26 ENCOUNTER — Other Ambulatory Visit: Payer: Self-pay

## 2020-01-26 DIAGNOSIS — C61 Malignant neoplasm of prostate: Secondary | ICD-10-CM | POA: Diagnosis not present

## 2020-01-26 DIAGNOSIS — E1142 Type 2 diabetes mellitus with diabetic polyneuropathy: Secondary | ICD-10-CM | POA: Diagnosis not present

## 2020-01-26 DIAGNOSIS — B351 Tinea unguium: Secondary | ICD-10-CM | POA: Diagnosis not present

## 2020-01-26 DIAGNOSIS — Z510 Encounter for antineoplastic radiation therapy: Secondary | ICD-10-CM | POA: Diagnosis not present

## 2020-01-27 ENCOUNTER — Ambulatory Visit
Admission: RE | Admit: 2020-01-27 | Discharge: 2020-01-27 | Disposition: A | Discharge status: Home / Self Care | Payer: PPO | Source: Ambulatory Visit | Attending: Radiation Oncology | Admitting: Radiation Oncology

## 2020-01-27 ENCOUNTER — Other Ambulatory Visit: Payer: Self-pay

## 2020-01-27 DIAGNOSIS — C61 Malignant neoplasm of prostate: Secondary | ICD-10-CM | POA: Diagnosis not present

## 2020-01-27 DIAGNOSIS — Z510 Encounter for antineoplastic radiation therapy: Secondary | ICD-10-CM | POA: Diagnosis not present

## 2020-01-28 ENCOUNTER — Ambulatory Visit: Payer: PPO | Admitting: "Endocrinology

## 2020-01-28 ENCOUNTER — Ambulatory Visit
Admission: RE | Admit: 2020-01-28 | Discharge: 2020-01-28 | Disposition: A | Discharge status: Home / Self Care | Payer: PPO | Source: Ambulatory Visit | Attending: Radiation Oncology | Admitting: Radiation Oncology

## 2020-01-28 ENCOUNTER — Encounter: Payer: Self-pay | Admitting: "Endocrinology

## 2020-01-28 ENCOUNTER — Ambulatory Visit (INDEPENDENT_AMBULATORY_CARE_PROVIDER_SITE_OTHER): Payer: PPO | Admitting: "Endocrinology

## 2020-01-28 ENCOUNTER — Other Ambulatory Visit: Payer: Self-pay

## 2020-01-28 VITALS — BP 137/73 | HR 65 | Ht 72.0 in | Wt 189.6 lb

## 2020-01-28 DIAGNOSIS — I10 Essential (primary) hypertension: Secondary | ICD-10-CM

## 2020-01-28 DIAGNOSIS — Z510 Encounter for antineoplastic radiation therapy: Secondary | ICD-10-CM | POA: Diagnosis not present

## 2020-01-28 DIAGNOSIS — E782 Mixed hyperlipidemia: Secondary | ICD-10-CM

## 2020-01-28 DIAGNOSIS — E1159 Type 2 diabetes mellitus with other circulatory complications: Secondary | ICD-10-CM

## 2020-01-28 DIAGNOSIS — C61 Malignant neoplasm of prostate: Secondary | ICD-10-CM | POA: Diagnosis not present

## 2020-01-28 DIAGNOSIS — E119 Type 2 diabetes mellitus without complications: Secondary | ICD-10-CM | POA: Diagnosis not present

## 2020-01-28 LAB — POCT GLYCOSYLATED HEMOGLOBIN (HGB A1C): Hemoglobin A1C: 6.5 % — AB (ref 4.0–5.6)

## 2020-01-28 NOTE — Progress Notes (Signed)
01/28/2020  Endocrinology follow-up note  Subjective:    Patient ID: Grant [name], male    DOB: 07-10-1934,    Past Medical History:  Diagnosis Date  . Arthritis   . CAD (coronary artery disease)   . Diabetes mellitus   . Erectile dysfunction   . GERD (gastroesophageal reflux disease)   . Gout 09-09-13   last flare 8'14 with gallbladder attack at [facility]  . Hyperlipidemia   . Hypertension   . Macular degeneration 09-09-13   bilateral"vision is poor"  . Pancreatitis 10--8-14   8'14 -enzymes improved  . Prostate cancer ([place])   . Psoriasis 09-09-13   elbows, knees  . RBBB   . Skin cancer    forehead  . Tubular adenoma    Past Surgical History:  Procedure Laterality Date  . CARDIAC CATHETERIZATION  02/19/2011   3 vessel CAD  . CATARACT EXTRACTION W/PHACO Left 02/14/2016   Procedure: CATARACT EXTRACTION PHACO AND INTRAOCULAR LENS PLACEMENT (IOC);  Surgeon: Rutherford Guys, MD;  Location: [facility];  Service: Ophthalmology;  Laterality: Left;  CDE:6.96  . CATARACT EXTRACTION W/PHACO Right 02/28/2016   Procedure: CATARACT EXTRACTION PHACO AND INTRAOCULAR LENS PLACEMENT (IOC);  Surgeon: Rutherford Guys, MD;  Location: [facility];  Service: Ophthalmology;  Laterality: Right;  CDE:9.54  . CHOLECYSTECTOMY N/A 10/27/2013   Procedure: LAPAROSCOPIC CHOLECYSTECTOMY WITH INTRAOPERATIVE CHOLANGIOGRAM WITH LIVER BIOPSY;  Surgeon: Pedro Earls, MD;  Location: [facility];  Service: General;  Laterality: N/A;  . COLONOSCOPY  02/25/2012   Procedure: COLONOSCOPY;  Surgeon: Daneil Dolin, MD;  Location: [facility] SU[facility];  Service: Endoscopy;  Laterality: N/A;  7:30 AM polyps removed.  . COLONOSCOPY N/A 02/11/2015   Dr.Rourk- multiple colonic polyps bx= tubular adenoma and an inflammatory polyp  . CORONARY ARTERY BYPASS GRAFT  02-28-2011   LIMA to LAD,SVG to acute marginal branch of RCA & sequential SVG to OM1 & OM2.  . ESOPHAGOGASTRODUODENOSCOPY N/A 05/10/2016   RMR: mild schatzki ring, medium sized hh, H.pylori  gastritis  . NM MYOCAR PERF WALL MOTION  02/15/2011   High Risk  . PILONIDAL CYST EXCISION    . US ECHOCARDIOGRAPHY  11/09/2008   mildly impaired diastolic dysfunction - Grade I   Social History   Socioeconomic History  . Marital status: Married    Spouse name: Not on file  . Number of children: 1  . Years of education: Not on file  . Highest education level: Not on file  Occupational History    Comment: retired  Tobacco Use  . Smoking status: Former Smoker    Packs/day: 0.50    Years: 25.00    Pack years: 12.50    Types: Cigarettes    Quit date: 02/25/1983    Years since quitting: 36.9  . Smokeless tobacco: Never Used  Substance and Sexual Activity  . Alcohol use: Yes    Comment: occasional  . Drug use: No  . Sexual activity: Not Currently  Other Topics Concern  . Not on file  Social History Narrative  . Not on file   Social Determinants of Health   Financial Resource Strain:   . Difficulty of Paying Living Expenses: Not on file  Food Insecurity:   . Worried About Charity fundraiser in the Last Year: Not on file  . Ran Out of Food in the Last Year: Not on file  Transportation Needs:   . Lack of Transportation (Medical): Not on file  . Lack of Transportation (Non-Medical): Not on file  Physical Activity:   . Days of Exercise per Week: Not on file  . Minutes of Exercise per Session: Not on file  Stress:   . Feeling of Stress : Not on file  Social Connections:   . Frequency of Communication with Friends and Family: Not on file  . Frequency of Social Gatherings with Friends and Family: Not on file  . Attends Religious Services: Not on file  . Active Member of Clubs or Organizations: Not on file  . Attends Archivist Meetings: Not on file  . Marital Status: Not on file   Outpatient Encounter Medications as of 01/28/2020  Medication Sig  . allopurinol (ZYLOPRIM) 100 MG tablet Take 100 mg by mouth daily with breakfast.   . aspirin EC 81 MG tablet Take 1  tablet (81 mg total) by mouth daily.  Marland Kitchen atorvastatin (LIPITOR) 40 MG tablet Take 40 mg by mouth every evening.   . betamethasone dipropionate (DIPROLENE) 0.05 % cream Apply 1 application topically daily as needed (siriasis).   . blood glucose meter kit and supplies KIT Dispense based on patient and insurance preference. Use up to 2 times daily as directed. (FOR ICD-10 E11.65) Talking blood glucose meter.  . carvedilol (COREG) 12.5 MG tablet Take 12.5 mg by mouth 2 (two) times daily with a meal.  . EASYMAX TEST test strip USE TO TEST TWICE DAILY.  Marland Kitchen Insulin Glargine (LANTUS SOLOSTAR) 100 UNIT/ML Solostar Pen ADMINISTER 26 UNITS UNDER THE SKIN DAILY AT 10 PM  . Misc Natural Products (TART CHERRY ADVANCED PO) Take 1,200 mg by mouth daily.  . Multiple Vitamin (MULTIVITAMIN) tablet Take 1 tablet by mouth daily.  . Multiple Vitamins-Minerals (PRESERVISION AREDS PO) Take 1 capsule by mouth 2 (two) times daily.   . pantoprazole (PROTONIX) 40 MG tablet Take 1 tablet (40 mg total) by mouth daily.  . quinapril (ACCUPRIL) 20 MG tablet Take 20 mg by mouth every morning.   . tamsulosin (FLOMAX) 0.4 MG CAPS capsule Take 1 capsule (0.4 mg total) by mouth daily after supper.  . [DISCONTINUED] LORazepam (ATIVAN) 1 MG tablet Take 1 tablet (1 mg total) by mouth as needed for anxiety (30 minutes prior to MRI and may repeat once, just prior to scan, if needed.).   No facility-administered encounter medications on file as of 01/28/2020.   ALLERGIES: Allergies  Allergen Reactions  . Amiodarone Nausea Only   VACCINATION STATUS: Immunization History  Administered Date(s) Administered  . Influenza-Unspecified 09/02/2013, 08/03/2018    Diabetes He presents for his follow-up diabetic visit. He has type 2 diabetes mellitus. Onset time: He was diagnosed at approximate age of 44 years. His disease course has been improving. There are no hypoglycemic associated symptoms. Pertinent negatives for hypoglycemia include no  confusion, headaches, pallor or seizures. There are no diabetic associated symptoms. Pertinent negatives for diabetes include no chest pain, no fatigue, no polydipsia, no polyphagia, no polyuria and no weakness. There are no hypoglycemic complications. Symptoms are improving. Diabetic complications include heart disease and retinopathy. Risk factors for coronary artery disease include dyslipidemia, diabetes mellitus, hypertension and tobacco exposure. He is compliant with treatment most of the time. His weight is stable. He is following a generally healthy diet. He has had a previous visit with a dietitian. He participates in exercise intermittently. His breakfast blood glucose range is generally 110-130 mg/dl. His overall blood glucose range is 110-130 mg/dl. Eye exam is current.  Hypertension This is a chronic problem. The current episode started more than 1 year  ago. Pertinent negatives include no chest pain, headaches, neck pain, palpitations or shortness of breath. Risk factors for coronary artery disease include dyslipidemia, diabetes mellitus, male gender, sedentary lifestyle and smoking/tobacco exposure. Hypertensive end-organ damage includes CAD/MI and retinopathy.  Hyperlipidemia This is a chronic problem. The current episode started more than 1 year ago. The problem is controlled. Pertinent negatives include no chest pain, myalgias or shortness of breath. Risk factors for coronary artery disease include diabetes mellitus, dyslipidemia, hypertension, a sedentary lifestyle and male sex.    Review of Systems  Constitutional: Negative for fatigue and unexpected weight change.  HENT: Negative for dental problem, mouth sores and trouble swallowing.   Eyes: Negative for visual disturbance.  Respiratory: Negative for cough, choking, chest tightness, shortness of breath and wheezing.   Cardiovascular: Negative for chest pain, palpitations and leg swelling.  Gastrointestinal: Negative for abdominal  distention, abdominal pain, constipation, diarrhea, nausea and vomiting.  Endocrine: Negative for polydipsia, polyphagia and polyuria.  Genitourinary: Negative for dysuria, flank pain, hematuria and urgency.  Musculoskeletal: Negative for back pain, gait problem, myalgias and neck pain.  Skin: Negative for pallor, rash and wound.  Neurological: Negative for seizures, syncope, weakness, numbness and headaches.  Psychiatric/Behavioral: Negative.  Negative for confusion and dysphoric mood.    Objective:    BP 137/73   Pulse 65   Ht 6' (1.829 m)   Wt 189 lb 9.6 oz (86 kg)   BMI 25.71 kg/m   Wt Readings from Last 3 Encounters:  01/28/20 189 lb 9.6 oz (86 kg)  11/17/19 184 lb (83.5 kg)  11/04/19 184 lb (83.5 kg)     Physical Exam- Limited  Constitutional:  Body mass index is 25.71 kg/m. , not in acute distress, normal state of mind Eyes:  EOMI, no exophthalmos Neck: Supple Thyroid: No gross goiter Respiratory: Adequate breathing efforts Musculoskeletal: no gross deformities, strength intact in all four extremities, no gross restriction of joint movements Skin:  no rashes, no hyperemia Neurological: no tremor with outstretched hands,    Results for orders placed or performed in visit on 01/28/20  HgB A1c  Result Value Ref Range   Hemoglobin A1C 6.5 (A) 4.0 - 5.6 %   HbA1c POC (<> result, manual entry)     HbA1c, POC (prediabetic range)     HbA1c, POC (controlled diabetic range)     Complete Blood Count (Most recent): Lab Results  Component Value Date   WBC 5.5 05/04/2016   HGB 10.7 (L) 05/04/2016   HCT 32.5 (L) 05/04/2016   MCV 92.1 05/04/2016   PLT 154 05/04/2016    Diabetic Labs (most recent): Lab Results  Component Value Date   HGBA1C 6.5 (A) 01/28/2020   HGBA1C 6.8 (H) 07/17/2019   HGBA1C 7.1 (H) 01/14/2019   Lipid Panel     Component Value Date/Time   CHOL 145 01/14/2019 1059   TRIG 75 01/14/2019 1059   HDL 45 01/14/2019 1059   CHOLHDL 3.2 01/14/2019  1059   VLDL 15 04/09/2017 0949   LDLCALC 84 01/14/2019 1059    Assessment & Plan:   1. Type 2 diabetes mellitus with vascular disease ([place])  - His diabetes is  complicated by coronary artery disease. - He reports near target glycemic profile both fasting and postprandial.  He is previsit lab presents with controlled glycemic profile  at fasting and at bedtime.   His point-of-care A1c 6.5%, improving from 7.1%.   -His recent labs are reviewed with him and his wife who is  his caretaker.  He denies recent hypoglycemia.  - Patient remains at a high risk for more acute and chronic complications of diabetes which include CAD, CVA, CKD, retinopathy, and neuropathy. These are all discussed in detail with the patient.  - I have re-counseled the patient on diet management and   by adopting a carbohydrate restricted / protein rich  Diet. - Patient is advised to stick to a routine mealtimes to eat 3 meals  a day and avoid unnecessary snacks ( to snack only to correct hypoglycemia).   -  Suggestion is made for him to avoid simple carbohydrates  from his diet including Cakes, Sweet Desserts / Pastries, Ice Cream, Soda (diet and regular), Sweet Tea, Candies, Chips, Cookies, Sweet Pastries,  Store Bought Juices, Alcohol in Excess of  1-2 drinks a day, Artificial Sweeteners, Coffee Creamer, and "Sugar-free" Products. This will help patient to have stable blood glucose profile and potentially avoid unintended weight gain.   - I have approached patient with the following individualized plan to manage diabetes and patient agrees.   -Based on his presentation with controlled glycemic profile, he will not need prandial insulin for now. -He is advised to continue Lantus 26 units nightly, continue monitoring blood glucose 2 times a day-daily before breakfast and at bedtime.    - Target A1c for him would be between 7 and 7.5%.  - Patient specific target  for A1c; LDL, HDL, Triglycerides, and  Waist  Circumference were discussed in detail.  2) BP/HTN: he is advised to home monitor blood pressure and report if > 140/90 on 2 separate readings. -  He is advised to continue his current medications including  lisinopril 20 mg p.o. daily.   3) Lipids/HPL: Recent lipid panel showed controlled with LDL of 84.  He is advised to continue atorvastatin 40 mg p.o. nightly.      4)  Weight/Diet: CDE consult in progress, exercise, and carbohydrates information provided.  5) Chronic Care/Health Maintenance:  -Patient  on ACEI and Statin medications and encouraged to continue to follow up with Ophthalmology (he is recently diagnosed with macular degeneration), Podiatrist at least yearly or according to recommendations, and advised to  stay away from smoking. I have recommended yearly flu vaccine and pneumonia vaccination at least every 5 years; moderate intensity exercise for up to 150 minutes weekly; and  sleep for at least 7 hours a day.  I advised patient to maintain close follow up with his PCP for primary care needs.  - Time spent on this patient care encounter:  35 min, of which > 50% was spent in  counseling and the rest reviewing his blood glucose logs , discussing his hypoglycemia and hyperglycemia episodes, reviewing his current and  previous labs / studies  ( including abstraction from other facilities) and medications  doses and developing a  long term treatment plan and documenting his care.   Please refer to Patient Instructions for Blood Glucose Monitoring and Insulin/Medications Dosing Guide"  in media tab for additional information. Please  also refer to " Patient Self Inventory" in the Media  tab for reviewed elements of pertinent patient history.  Versie Starks participated in the discussions, expressed understanding, and voiced agreement with the above plans.  All questions were answered to his satisfaction. he is encouraged to contact clinic should he have any questions or concerns prior  to his return visit.    Follow up plan: Return in about 3 months (around 04/26/2020) for Bring Meter and Logs-  A1c in Office, Follow up with Pre-visit Labs.  Glade Lloyd, MD Phone: [phone]  Fax: [phone]   This note was partially dictated with voice recognition software. Similar sounding words can be transcribed inadequately or may not  be corrected upon review.  01/28/2020, 7:08 PM

## 2020-01-29 ENCOUNTER — Other Ambulatory Visit: Payer: Self-pay

## 2020-01-29 ENCOUNTER — Ambulatory Visit
Admission: RE | Admit: 2020-01-29 | Discharge: 2020-01-29 | Disposition: A | Discharge status: Home / Self Care | Payer: PPO | Source: Ambulatory Visit | Attending: Radiation Oncology | Admitting: Radiation Oncology

## 2020-01-29 DIAGNOSIS — C61 Malignant neoplasm of prostate: Secondary | ICD-10-CM | POA: Diagnosis not present

## 2020-01-29 DIAGNOSIS — Z510 Encounter for antineoplastic radiation therapy: Secondary | ICD-10-CM | POA: Diagnosis not present

## 2020-02-01 ENCOUNTER — Other Ambulatory Visit: Payer: Self-pay

## 2020-02-01 ENCOUNTER — Ambulatory Visit
Admission: RE | Admit: 2020-02-01 | Discharge: 2020-02-01 | Disposition: A | Discharge status: Home / Self Care | Payer: PPO | Source: Ambulatory Visit | Attending: Radiation Oncology | Admitting: Radiation Oncology

## 2020-02-01 DIAGNOSIS — Z510 Encounter for antineoplastic radiation therapy: Secondary | ICD-10-CM | POA: Diagnosis not present

## 2020-02-01 DIAGNOSIS — R300 Dysuria: Secondary | ICD-10-CM | POA: Diagnosis not present

## 2020-02-01 DIAGNOSIS — C61 Malignant neoplasm of prostate: Secondary | ICD-10-CM | POA: Diagnosis not present

## 2020-02-02 ENCOUNTER — Ambulatory Visit
Admission: RE | Admit: 2020-02-02 | Discharge: 2020-02-02 | Disposition: A | Discharge status: Home / Self Care | Payer: PPO | Source: Ambulatory Visit | Attending: Radiation Oncology | Admitting: Radiation Oncology

## 2020-02-02 ENCOUNTER — Other Ambulatory Visit: Payer: Self-pay

## 2020-02-02 ENCOUNTER — Encounter: Payer: Self-pay | Admitting: Medical Oncology

## 2020-02-02 DIAGNOSIS — C61 Malignant neoplasm of prostate: Secondary | ICD-10-CM | POA: Diagnosis not present

## 2020-02-03 ENCOUNTER — Ambulatory Visit
Admission: RE | Admit: 2020-02-03 | Discharge: 2020-02-03 | Disposition: A | Discharge status: Home / Self Care | Payer: PPO | Source: Ambulatory Visit | Attending: Radiation Oncology | Admitting: Radiation Oncology

## 2020-02-03 ENCOUNTER — Other Ambulatory Visit: Payer: Self-pay

## 2020-02-03 DIAGNOSIS — C61 Malignant neoplasm of prostate: Secondary | ICD-10-CM | POA: Diagnosis not present

## 2020-02-04 ENCOUNTER — Other Ambulatory Visit: Payer: Self-pay

## 2020-02-04 ENCOUNTER — Ambulatory Visit
Admission: RE | Admit: 2020-02-04 | Discharge: 2020-02-04 | Disposition: A | Discharge status: Home / Self Care | Payer: PPO | Source: Ambulatory Visit | Attending: Radiation Oncology | Admitting: Radiation Oncology

## 2020-02-04 DIAGNOSIS — C61 Malignant neoplasm of prostate: Secondary | ICD-10-CM | POA: Diagnosis not present

## 2020-02-05 ENCOUNTER — Ambulatory Visit
Admission: RE | Admit: 2020-02-05 | Discharge: 2020-02-05 | Disposition: A | Discharge status: Home / Self Care | Payer: PPO | Source: Ambulatory Visit | Attending: Radiation Oncology | Admitting: Radiation Oncology

## 2020-02-05 ENCOUNTER — Other Ambulatory Visit: Payer: Self-pay

## 2020-02-05 DIAGNOSIS — C61 Malignant neoplasm of prostate: Secondary | ICD-10-CM | POA: Diagnosis not present

## 2020-02-08 ENCOUNTER — Ambulatory Visit
Admission: RE | Admit: 2020-02-08 | Discharge: 2020-02-08 | Disposition: A | Discharge status: Home / Self Care | Payer: PPO | Source: Ambulatory Visit | Attending: Radiation Oncology | Admitting: Radiation Oncology

## 2020-02-08 ENCOUNTER — Other Ambulatory Visit: Payer: Self-pay

## 2020-02-08 DIAGNOSIS — C61 Malignant neoplasm of prostate: Secondary | ICD-10-CM | POA: Diagnosis not present

## 2020-02-09 ENCOUNTER — Ambulatory Visit
Admission: RE | Admit: 2020-02-09 | Discharge: 2020-02-09 | Disposition: A | Discharge status: Home / Self Care | Payer: PPO | Source: Ambulatory Visit | Attending: Radiation Oncology | Admitting: Radiation Oncology

## 2020-02-09 ENCOUNTER — Other Ambulatory Visit: Payer: Self-pay

## 2020-02-09 DIAGNOSIS — C61 Malignant neoplasm of prostate: Secondary | ICD-10-CM | POA: Diagnosis not present

## 2020-02-10 ENCOUNTER — Ambulatory Visit
Admission: RE | Admit: 2020-02-10 | Discharge: 2020-02-10 | Disposition: A | Discharge status: Home / Self Care | Payer: PPO | Source: Ambulatory Visit | Attending: Radiation Oncology | Admitting: Radiation Oncology

## 2020-02-10 ENCOUNTER — Other Ambulatory Visit: Payer: Self-pay

## 2020-02-10 DIAGNOSIS — C61 Malignant neoplasm of prostate: Secondary | ICD-10-CM | POA: Diagnosis not present

## 2020-02-11 ENCOUNTER — Other Ambulatory Visit: Payer: Self-pay

## 2020-02-11 ENCOUNTER — Ambulatory Visit
Admission: RE | Admit: 2020-02-11 | Discharge: 2020-02-11 | Disposition: A | Discharge status: Home / Self Care | Payer: PPO | Source: Ambulatory Visit | Attending: Radiation Oncology | Admitting: Radiation Oncology

## 2020-02-11 DIAGNOSIS — C61 Malignant neoplasm of prostate: Secondary | ICD-10-CM | POA: Diagnosis not present

## 2020-02-12 ENCOUNTER — Ambulatory Visit
Admission: RE | Admit: 2020-02-12 | Discharge: 2020-02-12 | Disposition: A | Discharge status: Home / Self Care | Payer: PPO | Source: Ambulatory Visit | Attending: Radiation Oncology | Admitting: Radiation Oncology

## 2020-02-12 ENCOUNTER — Other Ambulatory Visit: Payer: Self-pay

## 2020-02-12 DIAGNOSIS — C61 Malignant neoplasm of prostate: Secondary | ICD-10-CM | POA: Diagnosis not present

## 2020-02-15 ENCOUNTER — Ambulatory Visit
Admission: RE | Admit: 2020-02-15 | Discharge: 2020-02-15 | Disposition: A | Discharge status: Home / Self Care | Payer: PPO | Source: Ambulatory Visit | Attending: Radiation Oncology | Admitting: Radiation Oncology

## 2020-02-15 ENCOUNTER — Other Ambulatory Visit: Payer: Self-pay

## 2020-02-15 DIAGNOSIS — C61 Malignant neoplasm of prostate: Secondary | ICD-10-CM | POA: Diagnosis not present

## 2020-02-16 ENCOUNTER — Other Ambulatory Visit: Payer: Self-pay

## 2020-02-16 ENCOUNTER — Ambulatory Visit
Admission: RE | Admit: 2020-02-16 | Discharge: 2020-02-16 | Disposition: A | Discharge status: Home / Self Care | Payer: PPO | Source: Ambulatory Visit | Attending: Radiation Oncology | Admitting: Radiation Oncology

## 2020-02-16 DIAGNOSIS — C61 Malignant neoplasm of prostate: Secondary | ICD-10-CM | POA: Diagnosis not present

## 2020-02-17 ENCOUNTER — Other Ambulatory Visit: Payer: Self-pay

## 2020-02-17 ENCOUNTER — Ambulatory Visit
Admission: RE | Admit: 2020-02-17 | Discharge: 2020-02-17 | Disposition: A | Discharge status: Home / Self Care | Payer: PPO | Source: Ambulatory Visit | Attending: Radiation Oncology | Admitting: Radiation Oncology

## 2020-02-17 DIAGNOSIS — C61 Malignant neoplasm of prostate: Secondary | ICD-10-CM | POA: Diagnosis not present

## 2020-02-18 ENCOUNTER — Ambulatory Visit: Payer: PPO

## 2020-02-19 ENCOUNTER — Telehealth: Payer: Self-pay | Admitting: Radiation Oncology

## 2020-02-19 ENCOUNTER — Ambulatory Visit
Admission: RE | Admit: 2020-02-19 | Discharge: 2020-02-19 | Disposition: A | Discharge status: Home / Self Care | Payer: PPO | Source: Ambulatory Visit | Attending: Radiation Oncology | Admitting: Radiation Oncology

## 2020-02-19 ENCOUNTER — Ambulatory Visit: Payer: PPO

## 2020-02-19 ENCOUNTER — Other Ambulatory Visit: Payer: Self-pay

## 2020-02-19 DIAGNOSIS — C61 Malignant neoplasm of prostate: Secondary | ICD-10-CM | POA: Diagnosis not present

## 2020-02-19 DIAGNOSIS — R300 Dysuria: Secondary | ICD-10-CM

## 2020-02-19 LAB — URINALYSIS, COMPLETE (UACMP) WITH MICROSCOPIC
Bacteria, UA: NONE SEEN
Bilirubin Urine: NEGATIVE
Glucose, UA: NEGATIVE mg/dL
Hgb urine dipstick: NEGATIVE
Ketones, ur: NEGATIVE mg/dL
Leukocytes,Ua: NEGATIVE
Nitrite: NEGATIVE
Protein, ur: 100 mg/dL — AB
Specific Gravity, Urine: 1.015 (ref 1.005–1.030)
pH: 6 (ref 5.0–8.0)

## 2020-02-19 NOTE — Telephone Encounter (Signed)
Reviewed urinalysis with Dr. Tammi Klippel. Decision was made to wait for culture. Phoned patient at home and explained. Patient verbalized understanding. Patient understands this RN will phone him the first of the coming week with his urine culture results.

## 2020-02-20 LAB — URINE CULTURE: Culture: NO GROWTH

## 2020-02-22 ENCOUNTER — Ambulatory Visit: Payer: PPO

## 2020-02-22 ENCOUNTER — Ambulatory Visit
Admission: RE | Admit: 2020-02-22 | Discharge: 2020-02-22 | Disposition: A | Discharge status: Home / Self Care | Payer: PPO | Source: Ambulatory Visit | Attending: Radiation Oncology | Admitting: Radiation Oncology

## 2020-02-22 ENCOUNTER — Other Ambulatory Visit: Payer: Self-pay

## 2020-02-22 DIAGNOSIS — C61 Malignant neoplasm of prostate: Secondary | ICD-10-CM | POA: Diagnosis not present

## 2020-02-22 NOTE — Progress Notes (Signed)
Please call patient with normal result.  Thanks. MM 

## 2020-02-23 ENCOUNTER — Encounter: Payer: Self-pay | Admitting: Radiation Oncology

## 2020-02-23 ENCOUNTER — Ambulatory Visit
Admission: RE | Admit: 2020-02-23 | Discharge: 2020-02-23 | Disposition: A | Discharge status: Home / Self Care | Payer: PPO | Source: Ambulatory Visit | Attending: Radiation Oncology | Admitting: Radiation Oncology

## 2020-02-23 ENCOUNTER — Other Ambulatory Visit: Payer: Self-pay

## 2020-02-23 DIAGNOSIS — C61 Malignant neoplasm of prostate: Secondary | ICD-10-CM | POA: Diagnosis not present

## 2020-02-25 ENCOUNTER — Other Ambulatory Visit: Payer: Self-pay | Admitting: Urology

## 2020-02-25 ENCOUNTER — Telehealth: Payer: Self-pay | Admitting: Radiation Oncology

## 2020-02-25 MED ORDER — PHENAZOPYRIDINE HCL 200 MG PO TABS: 200.0000 mg | ORAL_TABLET | Freq: Three times a day (TID) | ORAL | 1 refills | Status: DC | PRN

## 2020-02-25 NOTE — Telephone Encounter (Signed)
Phoned patient's home to inform him the pyridium script we discussed has been escribed to his preferred [facility]. No answer. Left detailed message.

## 2020-02-25 NOTE — Telephone Encounter (Signed)
Agreed.  Please let patient know that I have sent Rx for pyridium to his pharmacy in [place]

## 2020-02-25 NOTE — Telephone Encounter (Signed)
Received voicemail message from patient's wife requesting return call. Phoned back. Spoke with patient and wife over speaker phone. Wife concerned that patient still has burning discomfort with urination. Patient feels like the dysuria maybe getting better but isn't sure. Explained that the UA and culture were both negative for infection thus his dysuria is related to radiation therapy. Encouraged patient to begin OTC AZO. Patient explains he is already taking AZO and Flomax. Discussed pyridium script with patient and wife. They both agree a script would be better should the physician approve. Patient confirmed [facility] in [place] is his preferred pharmacy. Patient understands this RN will inform the providers and follow up with him shortly.

## 2020-03-02 DIAGNOSIS — C61 Malignant neoplasm of prostate: Secondary | ICD-10-CM | POA: Diagnosis not present

## 2020-03-02 DIAGNOSIS — I2510 Atherosclerotic heart disease of native coronary artery without angina pectoris: Secondary | ICD-10-CM | POA: Diagnosis not present

## 2020-03-02 DIAGNOSIS — N1831 Chronic kidney disease, stage 3a: Secondary | ICD-10-CM | POA: Diagnosis not present

## 2020-03-02 DIAGNOSIS — E1122 Type 2 diabetes mellitus with diabetic chronic kidney disease: Secondary | ICD-10-CM | POA: Diagnosis not present

## 2020-03-02 DIAGNOSIS — I129 Hypertensive chronic kidney disease with stage 1 through stage 4 chronic kidney disease, or unspecified chronic kidney disease: Secondary | ICD-10-CM | POA: Diagnosis not present

## 2020-03-16 NOTE — Progress Notes (Signed)
  Radiation Oncology         [phone] ________________________________  Name: Grant [name] MRN: [id number]  Date: 02/23/2020  DOB: Jun 21, 1934  End of Treatment Note  Diagnosis:   86 y.o. gentleman with Stage T1c adenocarcinoma of the prostate with Gleason score of 4+3, and PSA of 9.     Indication for treatment:  Curative, Definitive Radiotherapy       Radiation treatment dates:   2/9-3/23/21  Site/dose:   The prostate was treated to 70 Gy in 28 fractions of 2.5 Gy  Beams/energy:   The patient was treated with IMRT using volumetric arc therapy delivering 6 MV X-rays to clockwise and counterclockwise circumferential arcs with a 90 degree collimator offset to avoid dose scalloping.  Image guidance was performed with daily cone beam CT prior to each fraction to align to gold markers in the prostate and assure proper bladder and rectal fill volumes.  Immobilization was achieved with BodyFix custom mold.  Narrative: The patient tolerated radiation treatment relatively well.   The patient experienced some minor urinary irritation and modest fatigue.    Plan: The patient has completed radiation treatment. He will return to radiation oncology clinic for routine followup in one month. I advised him to call or return sooner if he has any questions or concerns related to his recovery or treatment. ________________________________  Sheral Apley. Tammi Klippel, M.D.

## 2020-03-17 DIAGNOSIS — E663 Overweight: Secondary | ICD-10-CM | POA: Diagnosis not present

## 2020-03-17 DIAGNOSIS — F329 Major depressive disorder, single episode, unspecified: Secondary | ICD-10-CM | POA: Diagnosis not present

## 2020-03-17 DIAGNOSIS — Z6827 Body mass index (BMI) 27.0-27.9, adult: Secondary | ICD-10-CM | POA: Diagnosis not present

## 2020-03-17 DIAGNOSIS — I2510 Atherosclerotic heart disease of native coronary artery without angina pectoris: Secondary | ICD-10-CM | POA: Diagnosis not present

## 2020-03-17 DIAGNOSIS — E7849 Other hyperlipidemia: Secondary | ICD-10-CM | POA: Diagnosis not present

## 2020-03-17 DIAGNOSIS — E119 Type 2 diabetes mellitus without complications: Secondary | ICD-10-CM | POA: Diagnosis not present

## 2020-03-17 DIAGNOSIS — Z0000 Encounter for general adult medical examination without abnormal findings: Secondary | ICD-10-CM | POA: Diagnosis not present

## 2020-03-17 DIAGNOSIS — Z1389 Encounter for screening for other disorder: Secondary | ICD-10-CM | POA: Diagnosis not present

## 2020-03-17 DIAGNOSIS — I10 Essential (primary) hypertension: Secondary | ICD-10-CM | POA: Diagnosis not present

## 2020-03-22 ENCOUNTER — Telehealth: Payer: Self-pay

## 2020-03-22 NOTE — Telephone Encounter (Signed)
Spoke with patients wife about appointment on the 22nd she was confirming it is a phone call.

## 2020-03-23 ENCOUNTER — Telehealth: Payer: Self-pay

## 2020-03-23 NOTE — Telephone Encounter (Signed)
Appointment reminder patient verbalized he understood this would be a telephone encounter

## 2020-03-24 ENCOUNTER — Other Ambulatory Visit: Payer: Self-pay

## 2020-03-24 ENCOUNTER — Encounter: Payer: Self-pay | Admitting: Urology

## 2020-03-24 ENCOUNTER — Ambulatory Visit
Admission: RE | Admit: 2020-03-24 | Discharge: 2020-03-24 | Disposition: A | Discharge status: Home / Self Care | Payer: PPO | Source: Ambulatory Visit | Attending: Urology | Admitting: Urology

## 2020-03-24 DIAGNOSIS — C61 Malignant neoplasm of prostate: Secondary | ICD-10-CM

## 2020-03-24 NOTE — Addendum Note (Signed)
Encounter addended by: Freeman Caldron, PA-C on: 03/24/2020 1:27 PM  Actions taken: Clinical Note Signed

## 2020-03-24 NOTE — Progress Notes (Addendum)
Patient denies any pain or discomfort at this time. No hematuria or dysuria noted patient reports nocturia x3 feels half the time he is emptying his bladder no urgency or leakage noted. Follow up appointment with urologist is scheduled

## 2020-03-24 NOTE — Progress Notes (Addendum)
Radiation Oncology         [phone] ________________________________  Name: CONRAD ZAJKOWSKI MRN: [id number]  Date: 03/24/2020  DOB: [birth year]  Post Treatment Note  CC: Redmond School, MD  Franchot Gallo, MD  Diagnosis:   86 y.o. gentleman with Stage T1c adenocarcinoma of the prostate with Gleason score of 4+3, and PSA of 9.     Interval Since Last Radiation:  4.5 weeks  2/9-3/23/21:   The prostate was treated to 70 Gy in 28 fractions of 2.5 Gy  Narrative:  I spoke with the patient to conduct his routine scheduled 1 month follow up visit via telephone to spare the patient unnecessary potential exposure in the healthcare setting during the current COVID-19 pandemic.  The patient was notified in advance and gave permission to proceed with this visit format. He tolerated radiation treatment relatively well with only minor urinary irritation and modest fatigue.                               On review of systems, the patient states that he is doing well in general.  His current IPSS is 12 with increased frequency, nocturia x3/night and weak stream but feels his LUTS are gradually improving.  Pre-treatment IPSS was 6. He continues taking Flomax daily as prescribed during his treatment and this is helping with his flow of stream and ability to empty his bladder without straining.  He denies dysuria, gross hematuria, fever or chills.  He reports a healthy appetite and is maintaining his weight.  He denies abdominal pain, nausea, vomiting or diarrhea.  Overall, he is quite pleased with his progress to date.  ALLERGIES:  is allergic to amiodarone.  Meds: Current Outpatient Medications  Medication Sig Dispense Refill  . allopurinol (ZYLOPRIM) 100 MG tablet Take 100 mg by mouth daily with breakfast.     . aspirin EC 81 MG tablet Take 1 tablet (81 mg total) by mouth daily.    Marland Kitchen atorvastatin (LIPITOR) 40 MG tablet Take 40 mg by mouth every evening.     . betamethasone dipropionate  (DIPROLENE) 0.05 % cream Apply 1 application topically daily as needed (siriasis).   12  . blood glucose meter kit and supplies KIT Dispense based on patient and insurance preference. Use up to 2 times daily as directed. (FOR ICD-10 E11.65) Talking blood glucose meter. 1 each 5  . carvedilol (COREG) 12.5 MG tablet Take 12.5 mg by mouth 2 (two) times daily with a meal.    . EASYMAX TEST test strip USE TO TEST TWICE DAILY. 100 strip 5  . Insulin Glargine (LANTUS SOLOSTAR) 100 UNIT/ML Solostar Pen ADMINISTER 26 UNITS UNDER THE SKIN DAILY AT 10 PM 15 mL 2  . Misc Natural Products (TART CHERRY ADVANCED PO) Take 1,200 mg by mouth daily.    . Multiple Vitamin (MULTIVITAMIN) tablet Take 1 tablet by mouth daily.    . Multiple Vitamins-Minerals (PRESERVISION AREDS PO) Take 1 capsule by mouth 2 (two) times daily.     . pantoprazole (PROTONIX) 40 MG tablet Take 1 tablet (40 mg total) by mouth daily. 30 tablet 11  . phenazopyridine (PYRIDIUM) 200 MG tablet Take 1 tablet (200 mg total) by mouth 3 (three) times daily as needed for pain. 30 tablet 1  . quinapril (ACCUPRIL) 20 MG tablet Take 20 mg by mouth every morning.     . tamsulosin (FLOMAX) 0.4 MG CAPS capsule Take 1 capsule (0.4 mg  total) by mouth daily after supper. 30 capsule 5   No current facility-administered medications for this encounter.    Physical Findings:  vitals were not taken for this visit.   /Unable to assess due to telephone follow-up visit format.  Lab Findings: Lab Results  Component Value Date   WBC 5.5 05/04/2016   HGB 10.7 (L) 05/04/2016   HCT 32.5 (L) 05/04/2016   MCV 92.1 05/04/2016   PLT 154 05/04/2016     Radiographic Findings: No results found.  Impression/Plan: 1. 86 y.o. gentleman with Stage T1c adenocarcinoma of the prostate with Gleason score of 4+3, and PSA of 9.    He will continue to follow up with urology for ongoing PSA determinations but does not currently have an appointment for follow-up scheduled with  Dr. Diona Fanti. He understands what to expect with regards to PSA monitoring going forward. I will look forward to following his response to treatment via correspondence with urology, and would be happy to continue to participate in his care if clinically indicated. I talked to the patient about what to expect in the future, including his risk for erectile dysfunction and rectal bleeding. I encouraged him to call or return to the office if he has any questions regarding his previous radiation or possible radiation side effects. He was comfortable with this plan and will follow up as needed.  Today, a comprehensive survivorship care plan and treatment summary was reviewed with the patient today detailing his prostate cancer diagnosis, treatment course, potential late/long-term effects of treatment, appropriate follow-up care with recommendations for the future, and patient education resources.  A copy of this summary, along with a letter will be sent to the patient's primary care provider via fax after today's visit.  2. Cancer screening:  Due to Mr. Bastedo's history and his age, he should receive screening for skin cancers, colon cancer, and lung cancer. The information and recommendations are listed on the patient's comprehensive care plan/treatment summary and were reviewed in detail with the patient.     3. Health maintenance and wellness promotion: Mr. Lemmerman was encouraged to consume 5-7 servings of fruits and vegetables per day. He was provided a copy of the "Nutrition Rainbow" handout, as well as the handout "Take Control of Your Health and [place]" from the [place].  He was also encouraged to engage in moderate to vigorous exercise for 30 minutes per day most days of the week. Information was provided regarding the [facility] fitness program, which is designed for cancer survivors to help them become more physically fit after cancer treatments. We discussed that a  healthy BMI is 18.5-24.9 and that maintaining a healthy weight reduces risk of cancer recurrences.  He was instructed to limit his alcohol consumption and continue to abstain from tobacco use.  Lastly, he was encouraged to use sunscreen and wear protective clothing when in the sun.     4. Support services/counseling: It is not uncommon for this period of the patient's cancer care trajectory to be one of many emotions and stressors.  Mr. Carrington was encouraged to take advantage of our many support services programs, support groups, and/or counseling in coping with his new life as a cancer survivor after completing anti-cancer treatment.  He was offered support today through active listening and expressive supportive counseling.  He was given information regarding our available services and encouraged to contact me with any questions or for help enrolling in any of our support group/programs.  Nicholos Johns, PA-C

## 2020-03-30 ENCOUNTER — Telehealth: Payer: Self-pay | Admitting: "Endocrinology

## 2020-03-30 MED ORDER — PEN NEEDLES 31G X 6 MM MISC: 1.0000 | 3 refills | Status: DC

## 2020-03-30 NOTE — Telephone Encounter (Signed)
Rx sent to [facility]

## 2020-03-30 NOTE — Telephone Encounter (Signed)
Pt requesting pen needles for his lantus. Please send to [facility].

## 2020-04-01 DIAGNOSIS — N1831 Chronic kidney disease, stage 3a: Secondary | ICD-10-CM | POA: Diagnosis not present

## 2020-04-01 DIAGNOSIS — E1122 Type 2 diabetes mellitus with diabetic chronic kidney disease: Secondary | ICD-10-CM | POA: Diagnosis not present

## 2020-04-01 DIAGNOSIS — I129 Hypertensive chronic kidney disease with stage 1 through stage 4 chronic kidney disease, or unspecified chronic kidney disease: Secondary | ICD-10-CM | POA: Diagnosis not present

## 2020-04-01 DIAGNOSIS — I2510 Atherosclerotic heart disease of native coronary artery without angina pectoris: Secondary | ICD-10-CM | POA: Diagnosis not present

## 2020-04-01 DIAGNOSIS — C61 Malignant neoplasm of prostate: Secondary | ICD-10-CM | POA: Diagnosis not present

## 2020-04-05 DIAGNOSIS — B351 Tinea unguium: Secondary | ICD-10-CM | POA: Diagnosis not present

## 2020-04-05 DIAGNOSIS — E1142 Type 2 diabetes mellitus with diabetic polyneuropathy: Secondary | ICD-10-CM | POA: Diagnosis not present

## 2020-04-14 DIAGNOSIS — H43813 Vitreous degeneration, bilateral: Secondary | ICD-10-CM | POA: Diagnosis not present

## 2020-04-14 DIAGNOSIS — H26492 Other secondary cataract, left eye: Secondary | ICD-10-CM | POA: Diagnosis not present

## 2020-04-14 DIAGNOSIS — Z961 Presence of intraocular lens: Secondary | ICD-10-CM | POA: Diagnosis not present

## 2020-04-14 DIAGNOSIS — E119 Type 2 diabetes mellitus without complications: Secondary | ICD-10-CM | POA: Diagnosis not present

## 2020-04-14 DIAGNOSIS — Z794 Long term (current) use of insulin: Secondary | ICD-10-CM | POA: Diagnosis not present

## 2020-04-14 DIAGNOSIS — H353134 Nonexudative age-related macular degeneration, bilateral, advanced atrophic with subfoveal involvement: Secondary | ICD-10-CM | POA: Diagnosis not present

## 2020-04-19 DIAGNOSIS — E7849 Other hyperlipidemia: Secondary | ICD-10-CM | POA: Diagnosis not present

## 2020-04-19 DIAGNOSIS — E1159 Type 2 diabetes mellitus with other circulatory complications: Secondary | ICD-10-CM | POA: Diagnosis not present

## 2020-04-19 DIAGNOSIS — E782 Mixed hyperlipidemia: Secondary | ICD-10-CM | POA: Diagnosis not present

## 2020-04-20 LAB — LIPID PANEL
Cholesterol: 132 mg/dL (ref <200)
HDL: 49 mg/dL (ref >=40)
LDL Cholesterol (Calc): 70 mg/dL (calc)
Non-HDL Cholesterol (Calc): 83 mg/dL (calc) (ref <130)
Total CHOL/HDL Ratio: 2.7 (calc) (ref <5.0)
Triglycerides: 46 mg/dL (ref <150)

## 2020-04-20 LAB — T4, FREE: Free T4: 1.1 ng/dL (ref 0.8–1.8)

## 2020-04-20 LAB — MICROALBUMIN / CREATININE URINE RATIO
Creatinine, Urine: 81 mg/dL (ref 20–320)
Microalb Creat Ratio: 19 mcg/mg creat (ref <30)
Microalb, Ur: 1.5 mg/dL

## 2020-04-20 LAB — COMPLETE METABOLIC PANEL WITH GFR
AG Ratio: 2.2 (calc) (ref 1.0–2.5)
ALT: 20 U/L (ref 9–46)
AST: 19 U/L (ref 10–35)
Albumin: 3.9 g/dL (ref 3.6–5.1)
Alkaline phosphatase (APISO): 94 U/L (ref 35–144)
BUN/Creatinine Ratio: 23 (calc) — ABNORMAL HIGH (ref 6–22)
BUN: 29 mg/dL — ABNORMAL HIGH (ref 7–25)
CO2: 31 mmol/L (ref 20–32)
Calcium: 8.8 mg/dL (ref 8.6–10.3)
Chloride: 105 mmol/L (ref 98–110)
Creat: 1.28 mg/dL — ABNORMAL HIGH (ref 0.70–1.11)
GFR, Est African American: 59 mL/min/{1.73_m2} — ABNORMAL LOW (ref >=60)
GFR, Est Non African American: 51 mL/min/{1.73_m2} — ABNORMAL LOW (ref >=60)
Globulin: 1.8 g/dL (calc) — ABNORMAL LOW (ref 1.9–3.7)
Glucose, Bld: 55 mg/dL — ABNORMAL LOW (ref 65–99)
Potassium: 4.4 mmol/L (ref 3.5–5.3)
Sodium: 140 mmol/L (ref 135–146)
Total Bilirubin: 0.6 mg/dL (ref 0.2–1.2)
Total Protein: 5.7 g/dL — ABNORMAL LOW (ref 6.1–8.1)

## 2020-04-20 LAB — VITAMIN D 25 HYDROXY (VIT D DEFICIENCY, FRACTURES): Vit D, 25-Hydroxy: 35 ng/mL (ref 30–100)

## 2020-04-20 LAB — TSH: TSH: 2.5 mIU/L (ref 0.40–4.50)

## 2020-04-27 ENCOUNTER — Other Ambulatory Visit: Payer: Self-pay

## 2020-04-27 ENCOUNTER — Ambulatory Visit (INDEPENDENT_AMBULATORY_CARE_PROVIDER_SITE_OTHER): Payer: PPO | Admitting: "Endocrinology

## 2020-04-27 ENCOUNTER — Encounter: Payer: Self-pay | Admitting: "Endocrinology

## 2020-04-27 VITALS — BP 139/75 | HR 68 | Ht 72.0 in | Wt 193.4 lb

## 2020-04-27 DIAGNOSIS — E1159 Type 2 diabetes mellitus with other circulatory complications: Secondary | ICD-10-CM | POA: Diagnosis not present

## 2020-04-27 DIAGNOSIS — E782 Mixed hyperlipidemia: Secondary | ICD-10-CM

## 2020-04-27 DIAGNOSIS — E119 Type 2 diabetes mellitus without complications: Secondary | ICD-10-CM | POA: Diagnosis not present

## 2020-04-27 DIAGNOSIS — I10 Essential (primary) hypertension: Secondary | ICD-10-CM

## 2020-04-27 LAB — POCT GLYCOSYLATED HEMOGLOBIN (HGB A1C): Hemoglobin A1C: 7.1 % — AB (ref 4.0–5.6)

## 2020-04-27 MED ORDER — LANTUS SOLOSTAR 100 UNIT/ML ~~LOC~~ SOPN: PEN_INJECTOR | SUBCUTANEOUS | 2 refills | Status: DC

## 2020-04-27 NOTE — Progress Notes (Signed)
04/27/2020  Endocrinology follow-up note  Subjective:    Patient ID: Grant [name], male    DOB: 19-Feb-1934,    Past Medical History:  Diagnosis Date  . Arthritis   . CAD (coronary artery disease)   . Diabetes mellitus   . Erectile dysfunction   . GERD (gastroesophageal reflux disease)   . Gout 09-09-13   last flare 8'14 with gallbladder attack at [facility]  . Hyperlipidemia   . Hypertension   . Macular degeneration 09-09-13   bilateral"vision is poor"  . Pancreatitis 10--8-14   8'14 -enzymes improved  . Prostate cancer ([place])   . Psoriasis 09-09-13   elbows, knees  . RBBB   . Skin cancer    forehead  . Tubular adenoma    Past Surgical History:  Procedure Laterality Date  . CARDIAC CATHETERIZATION  02/19/2011   3 vessel CAD  . CATARACT EXTRACTION W/PHACO Left 02/14/2016   Procedure: CATARACT EXTRACTION PHACO AND INTRAOCULAR LENS PLACEMENT (IOC);  Surgeon: Rutherford Guys, MD;  Location: [facility];  Service: Ophthalmology;  Laterality: Left;  CDE:6.96  . CATARACT EXTRACTION W/PHACO Right 02/28/2016   Procedure: CATARACT EXTRACTION PHACO AND INTRAOCULAR LENS PLACEMENT (IOC);  Surgeon: Rutherford Guys, MD;  Location: [facility];  Service: Ophthalmology;  Laterality: Right;  CDE:9.54  . CHOLECYSTECTOMY N/A 10/27/2013   Procedure: LAPAROSCOPIC CHOLECYSTECTOMY WITH INTRAOPERATIVE CHOLANGIOGRAM WITH LIVER BIOPSY;  Surgeon: Pedro Earls, MD;  Location: [facility] ORS;  Service: General;  Laterality: N/A;  . COLONOSCOPY  02/25/2012   Procedure: COLONOSCOPY;  Surgeon: Daneil Dolin, MD;  Location: [facility] SUITE;  Service: Endoscopy;  Laterality: N/A;  7:30 AM polyps removed.  . COLONOSCOPY N/A 02/11/2015   Dr.Rourk- multiple colonic polyps bx= tubular adenoma and an inflammatory polyp  . CORONARY ARTERY BYPASS GRAFT  02-28-2011   LIMA to LAD,SVG to acute marginal branch of RCA & sequential SVG to OM1 & OM2.  . ESOPHAGOGASTRODUODENOSCOPY N/A 05/10/2016   RMR: mild schatzki ring, medium sized hh, H.pylori  gastritis  . NM MYOCAR PERF WALL MOTION  02/15/2011   High Risk  . PILONIDAL CYST EXCISION    . US ECHOCARDIOGRAPHY  11/09/2008   mildly impaired diastolic dysfunction - Grade I   Social History   Socioeconomic History  . Marital status: Married    Spouse name: Not on file  . Number of children: 1  . Years of education: Not on file  . Highest education level: Not on file  Occupational History    Comment: retired  Tobacco Use  . Smoking status: Former Smoker    Packs/day: 0.50    Years: 25.00    Pack years: 12.50    Types: Cigarettes    Quit date: 02/25/1983    Years since quitting: 37.1  . Smokeless tobacco: Never Used  Substance and Sexual Activity  . Alcohol use: Yes    Comment: occasional  . Drug use: No  . Sexual activity: Not Currently  Other Topics Concern  . Not on file  Social History Narrative  . Not on file   Social Determinants of Health   Financial Resource Strain:   . Difficulty of Paying Living Expenses:   Food Insecurity:   . Worried About Charity fundraiser in the Last Year:   . Arboriculturist in the Last Year:   Transportation Needs:   . Film/video editor (Medical):   Marland Kitchen Lack of Transportation (Non-Medical):   Physical Activity:   . Days of Exercise per  Week:   . Minutes of Exercise per Session:   Stress:   . Feeling of Stress :   Social Connections:   . Frequency of Communication with Friends and Family:   . Frequency of Social Gatherings with Friends and Family:   . Attends Religious Services:   . Active Member of Clubs or Organizations:   . Attends Archivist Meetings:   Marland Kitchen Marital Status:    Outpatient Encounter Medications as of 04/27/2020  Medication Sig  . allopurinol (ZYLOPRIM) 100 MG tablet Take 100 mg by mouth daily with breakfast.   . aspirin EC 81 MG tablet Take 1 tablet (81 mg total) by mouth daily.  Marland Kitchen atorvastatin (LIPITOR) 40 MG tablet Take 40 mg by mouth every evening.   . betamethasone dipropionate  (DIPROLENE) 0.05 % cream Apply 1 application topically daily as needed (siriasis).   . blood glucose meter kit and supplies KIT Dispense based on patient and insurance preference. Use up to 2 times daily as directed. (FOR ICD-10 E11.65) Talking blood glucose meter.  . carvedilol (COREG) 12.5 MG tablet Take 12.5 mg by mouth 2 (two) times daily with a meal.  . EASYMAX TEST test strip USE TO TEST TWICE DAILY.  Marland Kitchen insulin glargine (LANTUS SOLOSTAR) 100 UNIT/ML Solostar Pen ADMINISTER 22 UNITS UNDER THE SKIN DAILY AT 10 PM  . Insulin Pen Needle (PEN NEEDLES) 31G X 6 MM MISC 1 each by Does not apply route as directed. Use as directed for lantus injection daily at bedtime  . Misc Natural Products (TART CHERRY ADVANCED PO) Take 1,200 mg by mouth daily.  . Multiple Vitamin (MULTIVITAMIN) tablet Take 1 tablet by mouth daily.  . Multiple Vitamins-Minerals (PRESERVISION AREDS PO) Take 1 capsule by mouth 2 (two) times daily.   . quinapril (ACCUPRIL) 20 MG tablet Take 20 mg by mouth every morning.   . tamsulosin (FLOMAX) 0.4 MG CAPS capsule Take 1 capsule (0.4 mg total) by mouth daily after supper.  . [DISCONTINUED] Insulin Glargine (LANTUS SOLOSTAR) 100 UNIT/ML Solostar Pen ADMINISTER 26 UNITS UNDER THE SKIN DAILY AT 10 PM  . [DISCONTINUED] pantoprazole (PROTONIX) 40 MG tablet Take 1 tablet (40 mg total) by mouth daily.  . [DISCONTINUED] phenazopyridine (PYRIDIUM) 200 MG tablet Take 1 tablet (200 mg total) by mouth 3 (three) times daily as needed for pain.   No facility-administered encounter medications on file as of 04/27/2020.   ALLERGIES: Allergies  Allergen Reactions  . Amiodarone Nausea Only   VACCINATION STATUS: Immunization History  Administered Date(s) Administered  . Influenza-Unspecified 09/02/2013, 08/03/2018    Diabetes He presents for his follow-up diabetic visit. He has type 2 diabetes mellitus. Onset time: He was diagnosed at approximate age of 89 years. His disease course has been  stable. There are no hypoglycemic associated symptoms. Pertinent negatives for hypoglycemia include no confusion, headaches, pallor or seizures. There are no diabetic associated symptoms. Pertinent negatives for diabetes include no chest pain, no fatigue, no polydipsia, no polyphagia, no polyuria and no weakness. There are no hypoglycemic complications. Symptoms are stable. Diabetic complications include heart disease and retinopathy. Risk factors for coronary artery disease include dyslipidemia, diabetes mellitus, hypertension and tobacco exposure. He is compliant with treatment most of the time. His weight is fluctuating minimally. He is following a generally healthy diet. He has had a previous visit with a dietitian. He participates in exercise intermittently. His home blood glucose trend is decreasing steadily. His breakfast blood glucose range is generally 90-110 mg/dl. His overall blood glucose  range is 110-130 mg/dl. Eye exam is current.  Hypertension This is a chronic problem. The current episode started more than 1 year ago. Pertinent negatives include no chest pain, headaches, neck pain, palpitations or shortness of breath. Risk factors for coronary artery disease include dyslipidemia, diabetes mellitus, male gender, sedentary lifestyle and smoking/tobacco exposure. Hypertensive end-organ damage includes CAD/MI and retinopathy.  Hyperlipidemia This is a chronic problem. The current episode started more than 1 year ago. The problem is controlled. Pertinent negatives include no chest pain, myalgias or shortness of breath. Risk factors for coronary artery disease include diabetes mellitus, dyslipidemia, hypertension, a sedentary lifestyle and male sex.    Review of Systems  Constitutional: Negative for fatigue and unexpected weight change.  HENT: Negative for dental problem, mouth sores and trouble swallowing.   Eyes: Negative for visual disturbance.  Respiratory: Negative for cough, choking, chest  tightness, shortness of breath and wheezing.   Cardiovascular: Negative for chest pain, palpitations and leg swelling.  Gastrointestinal: Negative for abdominal distention, abdominal pain, constipation, diarrhea, nausea and vomiting.  Endocrine: Negative for polydipsia, polyphagia and polyuria.  Genitourinary: Negative for dysuria, flank pain, hematuria and urgency.  Musculoskeletal: Negative for back pain, gait problem, myalgias and neck pain.  Skin: Negative for pallor, rash and wound.  Neurological: Negative for seizures, syncope, weakness, numbness and headaches.  Psychiatric/Behavioral: Negative.  Negative for confusion and dysphoric mood.    Objective:    BP 139/75   Pulse 68   Ht 6' (1.829 m)   Wt 193 lb 6.4 oz (87.7 kg)   BMI 26.23 kg/m   Wt Readings from Last 3 Encounters:  04/27/20 193 lb 6.4 oz (87.7 kg)  01/28/20 189 lb 9.6 oz (86 kg)  11/17/19 184 lb (83.5 kg)      Physical Exam- Limited  Constitutional:  Body mass index is 26.23 kg/m. , not in acute distress, normal state of mind Eyes:  EOMI, no exophthalmos Neck: Supple Thyroid: No gross goiter Respiratory: Adequate breathing efforts Musculoskeletal: no gross deformities, strength intact in all four extremities, no gross restriction of joint movements Skin:  no rashes, no hyperemia Neurological: no tremor with outstretched hands,     Results for orders placed or performed in visit on 04/27/20  HgB A1c  Result Value Ref Range   Hemoglobin A1C 7.1 (A) 4.0 - 5.6 %   HbA1c POC (<> result, manual entry)     HbA1c, POC (prediabetic range)     HbA1c, POC (controlled diabetic range)     Complete Blood Count (Most recent): Lab Results  Component Value Date   WBC 5.5 05/04/2016   HGB 10.7 (L) 05/04/2016   HCT 32.5 (L) 05/04/2016   MCV 92.1 05/04/2016   PLT 154 05/04/2016    Diabetic Labs (most recent): Lab Results  Component Value Date   HGBA1C 7.1 (A) 04/27/2020   HGBA1C 6.5 (A) 01/28/2020   HGBA1C  6.8 (H) 07/17/2019   Lipid Panel     Component Value Date/Time   CHOL 132 04/19/2020 1033   TRIG 46 04/19/2020 1033   HDL 49 04/19/2020 1033   CHOLHDL 2.7 04/19/2020 1033   VLDL 15 04/09/2017 0949   LDLCALC 70 04/19/2020 1033    Assessment & Plan:   1. Type 2 diabetes mellitus with vascular disease (HCC)  - His diabetes is  complicated by coronary artery disease. - He presents with tightly controlled BG profile at fasting, no hypoglycemia. -His POC a1c today is 7.1%.   -His recent labs  are reviewed with him and his wife who is his caretaker.  He denies recent hypoglycemia.  - Patient remains at a high risk for more acute and chronic complications of diabetes which include CAD, CVA, CKD, retinopathy, and neuropathy. These are all discussed in detail with the patient.  - I have re-counseled the patient on diet management and   by adopting a carbohydrate restricted / protein rich  Diet. - Patient is advised to stick to a routine mealtimes to eat 3 meals  a day and avoid unnecessary snacks ( to snack only to correct hypoglycemia).    -  Suggestion is made for him to avoid simple carbohydrates  from his diet including Cakes, Sweet Desserts / Pastries, Ice Cream, Soda (diet and regular), Sweet Tea, Candies, Chips, Cookies, Sweet Pastries,  Store Bought Juices, Alcohol in Excess of  1-2 drinks a day, Artificial Sweeteners, Coffee Creamer, and "Sugar-free" Products. This will help patient to have stable blood glucose profile and potentially avoid unintended weight gain.   - I have approached patient with the following individualized plan to manage diabetes and patient agrees.   -Based on his presentation with controlled glycemic profile, he will not need prandial insulin for now. -He is advised to lower Lantus to 22 units nightly, continue monitoring blood glucose 2 times a day-daily before breakfast and at bedtime.    - Target A1c for him would be between 7 and 7.5%.  - Patient  specific target  for A1c; LDL, HDL, Triglycerides, and  Waist Circumference were discussed in detail.  2) BP/HTN:  His BP is controlled to target. -  He is advised to continue his current medications including  lisinopril 20 mg p.o. daily.   3) Lipids/HPL: Recent lipid panel showed controlled with LDL of 84.  He is advised to continue Atorvastatin 40 mg p.o. nightly.      4)  Weight/Diet:  His BMI is 9- he is not a candidate for weight loss. CDE consult in progress, exercise, and carbohydrates information provided.  5) Chronic Care/Health Maintenance:  -Patient  on ACEI and Statin medications and encouraged to continue to follow up with Ophthalmology (he is recently diagnosed with macular degeneration), Podiatrist at least yearly or according to recommendations, and advised to  stay away from smoking. I have recommended yearly flu vaccine and pneumonia vaccination at least every 5 years; moderate intensity exercise for up to 150 minutes weekly; and  sleep for at least 7 hours a day.  I advised patient to maintain close follow up with his PCP for primary care needs.  - Time spent on this patient care encounter:  35 min, of which > 50% was spent in  counseling and the rest reviewing his blood glucose logs , discussing his hypoglycemia and hyperglycemia episodes, reviewing his current and  previous labs / studies  ( including abstraction from other facilities) and medications  doses and developing a  long term treatment plan and documenting his care.   Please refer to Patient Instructions for Blood Glucose Monitoring and Insulin/Medications Dosing Guide"  in media tab for additional information. Please  also refer to " Patient Self Inventory" in the Media  tab for reviewed elements of pertinent patient history.  Grant [name] participated in the discussions, expressed understanding, and voiced agreement with the above plans.  All questions were answered to his satisfaction. he is encouraged to  contact clinic should he have any questions or concerns prior to his return visit.   Follow up  plan: Return in about 6 months (around 10/28/2020) for Bring Meter and Logs- A1c in Office.  Glade Lloyd, MD Phone: [phone]  Fax: [phone]   This note was partially dictated with voice recognition software. Similar sounding words can be transcribed inadequately or may not  be corrected upon review.  04/27/2020, 6:08 PM

## 2020-04-28 DIAGNOSIS — K219 Gastro-esophageal reflux disease without esophagitis: Secondary | ICD-10-CM | POA: Diagnosis not present

## 2020-04-28 DIAGNOSIS — E663 Overweight: Secondary | ICD-10-CM | POA: Diagnosis not present

## 2020-04-28 DIAGNOSIS — Z6828 Body mass index (BMI) 28.0-28.9, adult: Secondary | ICD-10-CM | POA: Diagnosis not present

## 2020-05-02 DIAGNOSIS — I129 Hypertensive chronic kidney disease with stage 1 through stage 4 chronic kidney disease, or unspecified chronic kidney disease: Secondary | ICD-10-CM | POA: Diagnosis not present

## 2020-05-02 DIAGNOSIS — E1122 Type 2 diabetes mellitus with diabetic chronic kidney disease: Secondary | ICD-10-CM | POA: Diagnosis not present

## 2020-05-02 DIAGNOSIS — C61 Malignant neoplasm of prostate: Secondary | ICD-10-CM | POA: Diagnosis not present

## 2020-05-02 DIAGNOSIS — N1831 Chronic kidney disease, stage 3a: Secondary | ICD-10-CM | POA: Diagnosis not present

## 2020-05-02 DIAGNOSIS — I2510 Atherosclerotic heart disease of native coronary artery without angina pectoris: Secondary | ICD-10-CM | POA: Diagnosis not present

## 2020-05-31 ENCOUNTER — Ambulatory Visit (INDEPENDENT_AMBULATORY_CARE_PROVIDER_SITE_OTHER): Payer: PPO | Admitting: Urology

## 2020-05-31 ENCOUNTER — Other Ambulatory Visit: Payer: Self-pay

## 2020-05-31 ENCOUNTER — Encounter: Payer: Self-pay | Admitting: Urology

## 2020-05-31 VITALS — BP 113/50 | HR 58 | Temp 97.2°F | Ht 72.0 in | Wt 190.0 lb

## 2020-05-31 DIAGNOSIS — R972 Elevated prostate specific antigen [PSA]: Secondary | ICD-10-CM | POA: Diagnosis not present

## 2020-05-31 DIAGNOSIS — C61 Malignant neoplasm of prostate: Secondary | ICD-10-CM | POA: Diagnosis not present

## 2020-05-31 LAB — POCT URINALYSIS DIPSTICK
Bilirubin, UA: NEGATIVE
Blood, UA: NEGATIVE
Glucose, UA: NEGATIVE
Ketones, UA: NEGATIVE
Leukocytes, UA: NEGATIVE
Nitrite, UA: NEGATIVE
Protein, UA: NEGATIVE
Spec Grav, UA: 1.015 (ref 1.010–1.025)
Urobilinogen, UA: 0.2 E.U./dL
pH, UA: 5 (ref 5.0–8.0)

## 2020-05-31 NOTE — Progress Notes (Signed)
H&P    History of Present Illness: This man is here today for follow-up of prostate cancer.  He underwent ultrasound and biopsy of his prostate on 08/18/2019.  At that time, PSA was 9.0.  Prostate volume was 21 g.  4/cores revealed adenocarcinoma-2 revealed GS 4+3 pattern, 2 revealed GS 3+4 pattern.  He completed 28 EBRT treatments--completing these on 3.23.2021.    Past Medical History:  Diagnosis Date  . Arthritis   . CAD (coronary artery disease)   . Diabetes mellitus   . Erectile dysfunction   . GERD (gastroesophageal reflux disease)   . Gout 09-09-13   last flare 8'14 with gallbladder attack at [facility]  . Hyperlipidemia   . Hypertension   . Macular degeneration 09-09-13   bilateral"vision is poor"  . Pancreatitis 10--8-14   8'14 -enzymes improved  . Prostate cancer ([place])   . Psoriasis 09-09-13   elbows, knees  . RBBB   . Skin cancer    forehead  . Tubular adenoma     Past Surgical History:  Procedure Laterality Date  . CARDIAC CATHETERIZATION  02/19/2011   3 vessel CAD  . CATARACT EXTRACTION W/PHACO Left 02/14/2016   Procedure: CATARACT EXTRACTION PHACO AND INTRAOCULAR LENS PLACEMENT (IOC);  Surgeon: Rutherford Guys, MD;  Location: [facility];  Service: Ophthalmology;  Laterality: Left;  CDE:6.96  . CATARACT EXTRACTION W/PHACO Right 02/28/2016   Procedure: CATARACT EXTRACTION PHACO AND INTRAOCULAR LENS PLACEMENT (IOC);  Surgeon: Rutherford Guys, MD;  Location: [facility];  Service: Ophthalmology;  Laterality: Right;  CDE:9.54  . CHOLECYSTECTOMY N/A 10/27/2013   Procedure: LAPAROSCOPIC CHOLECYSTECTOMY WITH INTRAOPERATIVE CHOLANGIOGRAM WITH LIVER BIOPSY;  Surgeon: Pedro Earls, MD;  Location: [facility];  Service: General;  Laterality: N/A;  . COLONOSCOPY  02/25/2012   Procedure: COLONOSCOPY;  Surgeon: Daneil Dolin, MD;  [facility];  Service: Endoscopy;  Laterality: N/A;  7:30 AM polyps removed.  . COLONOSCOPY N/A 02/11/2015   Dr.Rourk- multiple colonic polyps bx= tubular  adenoma and an inflammatory polyp  . CORONARY ARTERY BYPASS GRAFT  02-28-2011   LIMA to LAD,SVG to acute marginal branch of RCA & sequential SVG to OM1 & OM2.  . ESOPHAGOGASTRODUODENOSCOPY N/A 05/10/2016   RMR: mild schatzki ring, medium sized hh, H.pylori gastritis  . NM MYOCAR PERF WALL MOTION  02/15/2011   High Risk  . PILONIDAL CYST EXCISION    . US ECHOCARDIOGRAPHY  11/09/2008   mildly impaired diastolic dysfunction - Grade I    Home Medications:  Allergies as of 05/31/2020      Reactions   Amiodarone Nausea Only      Medication List       Accurate as of May 31, 2020 11:12 AM. If you have any questions, ask your nurse or doctor.        allopurinol 100 MG tablet Commonly known as: ZYLOPRIM Take 100 mg by mouth daily with breakfast.   aspirin EC 81 MG tablet Take 1 tablet (81 mg total) by mouth daily.   atorvastatin 40 MG tablet Commonly known as: LIPITOR Take 40 mg by mouth every evening.   betamethasone dipropionate 0.05 % cream Apply 1 application topically daily as needed (siriasis).   blood glucose meter kit and supplies Kit Dispense based on patient and insurance preference. Use up to 2 times daily as directed. (FOR ICD-10 E11.65) Talking blood glucose meter.   carvedilol 12.5 MG tablet Commonly known as: COREG Take 12.5 mg by mouth 2 (two) times daily with a meal.  diazepam 10 MG tablet Commonly known as: VALIUM Take 10 mg by mouth daily.   EasyMax Test test strip Generic drug: glucose blood USE TO TEST TWICE DAILY.   famotidine 40 MG tablet Commonly known as: PEPCID   Lantus SoloStar 100 UNIT/ML Solostar Pen Generic drug: insulin glargine ADMINISTER 22 UNITS UNDER THE SKIN DAILY AT 10 PM   multivitamin tablet Take 1 tablet by mouth daily.   Pen Needles 31G X 6 MM Misc 1 each by Does not apply route as directed. Use as directed for lantus injection daily at bedtime   PRESERVISION AREDS PO Take 1 capsule by mouth 2 (two) times daily.     quinapril 20 MG tablet Commonly known as: ACCUPRIL Take 20 mg by mouth every morning.   tamsulosin 0.4 MG Caps capsule Commonly known as: Flomax Take 1 capsule (0.4 mg total) by mouth daily after supper.   TART CHERRY ADVANCED PO Take 1,200 mg by mouth daily.       Allergies:  Allergies  Allergen Reactions  . Amiodarone Nausea Only    Family History  Problem Relation Age of Onset  . Stroke Father   . Leukemia Mother   . Heart failure Brother   . Pancreatic cancer Sister   . Hypertension Sister   . Breast cancer Sister   . Heart failure Sister   . Anesthesia problems Neg Hx   . Hypotension Neg Hx   . Malignant hyperthermia Neg Hx   . Pseudochol deficiency Neg Hx   . Colon cancer Neg Hx   . Prostate cancer Neg Hx     Social History:  reports that he quit smoking about 37 years ago. His smoking use included cigarettes. He has a 12.50 pack-year smoking history. He has never used smokeless tobacco. He reports current alcohol use. He reports that he does not use drugs.  ROS: Urological Symptom Review  Patient is experiencing the following symptoms: Get up at night to urinate Review of Systems Gastrointestinal (upper)  : Negative for upper GI symptoms Gastrointestinal (lower) : Negative for lower GI symptoms Constitutional : Negative for symptoms Skin: Negative for skin symptoms Eyes: Negative for eye symptoms Ear/Nose/Throat : Negative for Ear/Nose/Throat symptoms Hematologic/Lymphatic: Negative for Hematologic/Lymphatic symptoms Cardiovascular : Leg swelling Respiratory : Negative for respiratory symptoms Endocrine: Negative for endocrine symptoms Musculoskeletal: Negative for musculoskeletal symptoms Neurological: Negative for neurological symptoms Psychologic: Negative for psychiatric symptoms   Physical Exam:  Vital signs in last 24 hours: BP (!) 113/50   Pulse (!) 58   Temp (!) 97.2 F (36.2 C)   Ht 6' (1.829 m)   Wt 190 lb (86.2 kg)    BMI 25.77 kg/m  Constitutional:  Alert and oriented, No acute distress Cardiovascular: Regular rate  Respiratory: Normal respiratory effort Neurologic: Grossly intact, no focal deficits Psychiatric: Normal mood and affect   Results for orders placed or performed in visit on 05/31/20 (from the past 24 hour(s))  POCT urinalysis dipstick     Status: None   Collection Time: 05/31/20 10:23 AM  Result Value Ref Range   Color, UA yellow    Clarity, UA     Glucose, UA Negative Negative   Bilirubin, UA neg    Ketones, UA neg    Spec Grav, UA 1.015 1.010 - 1.025   Blood, UA neg    pH, UA 5.0 5.0 - 8.0   Protein, UA Negative Negative   Urobilinogen, UA 0.2 0.2 or 1.0 E.U./dL   Nitrite, UA neg  Leukocytes, UA Negative Negative   Appearance clear    Odor      I have reviewed prior pt notes  I have reviewed notes from referring/previous physicians  I have reviewed urinalysis results  I have independently reviewed prior imaging  I have reviewed prior PSA results   Impression/Assessment:  Intermediate risk PCa, s/p recent EBRT which he seemed to tolerate well. No significant radiation-related sx's  Plan:  Will check PSA today  I'll see back on a q 4 mo followup schedule

## 2020-05-31 NOTE — Progress Notes (Signed)
See progress note.

## 2020-06-01 DIAGNOSIS — I2510 Atherosclerotic heart disease of native coronary artery without angina pectoris: Secondary | ICD-10-CM | POA: Diagnosis not present

## 2020-06-01 DIAGNOSIS — E1122 Type 2 diabetes mellitus with diabetic chronic kidney disease: Secondary | ICD-10-CM | POA: Diagnosis not present

## 2020-06-01 DIAGNOSIS — C61 Malignant neoplasm of prostate: Secondary | ICD-10-CM | POA: Diagnosis not present

## 2020-06-01 DIAGNOSIS — I129 Hypertensive chronic kidney disease with stage 1 through stage 4 chronic kidney disease, or unspecified chronic kidney disease: Secondary | ICD-10-CM | POA: Diagnosis not present

## 2020-06-01 DIAGNOSIS — N1831 Chronic kidney disease, stage 3a: Secondary | ICD-10-CM | POA: Diagnosis not present

## 2020-06-01 LAB — PSA: PSA: 1.3 ng/mL (ref <=4.0)

## 2020-06-03 ENCOUNTER — Telehealth: Payer: Self-pay

## 2020-06-03 NOTE — Telephone Encounter (Signed)
-----   Message from Franchot Gallo, MD sent at 06/03/2020  6:43 AM EDT ----- Notify pt--good news psa down to 1.3 already ----- Message ----- From: Iris Pert, LPN Sent: 12/06/457   8:08 AM EDT To: Franchot Gallo, MD  Please review

## 2020-06-03 NOTE — Progress Notes (Signed)
Result sent by mail

## 2020-06-03 NOTE — Telephone Encounter (Signed)
Pt notified. PSA down to 1.3.

## 2020-06-14 DIAGNOSIS — B351 Tinea unguium: Secondary | ICD-10-CM | POA: Diagnosis not present

## 2020-06-14 DIAGNOSIS — E1142 Type 2 diabetes mellitus with diabetic polyneuropathy: Secondary | ICD-10-CM | POA: Diagnosis not present

## 2020-06-28 DIAGNOSIS — L570 Actinic keratosis: Secondary | ICD-10-CM | POA: Diagnosis not present

## 2020-06-28 DIAGNOSIS — L218 Other seborrheic dermatitis: Secondary | ICD-10-CM | POA: Diagnosis not present

## 2020-06-28 DIAGNOSIS — Z85828 Personal history of other malignant neoplasm of skin: Secondary | ICD-10-CM | POA: Diagnosis not present

## 2020-07-18 DIAGNOSIS — H26493 Other secondary cataract, bilateral: Secondary | ICD-10-CM | POA: Diagnosis not present

## 2020-07-18 DIAGNOSIS — H353134 Nonexudative age-related macular degeneration, bilateral, advanced atrophic with subfoveal involvement: Secondary | ICD-10-CM | POA: Diagnosis not present

## 2020-07-18 DIAGNOSIS — Z961 Presence of intraocular lens: Secondary | ICD-10-CM | POA: Diagnosis not present

## 2020-07-18 DIAGNOSIS — E1136 Type 2 diabetes mellitus with diabetic cataract: Secondary | ICD-10-CM | POA: Diagnosis not present

## 2020-07-18 DIAGNOSIS — Z794 Long term (current) use of insulin: Secondary | ICD-10-CM | POA: Diagnosis not present

## 2020-07-19 ENCOUNTER — Other Ambulatory Visit: Payer: Self-pay | Admitting: "Endocrinology

## 2020-08-02 DIAGNOSIS — N1831 Chronic kidney disease, stage 3a: Secondary | ICD-10-CM | POA: Diagnosis not present

## 2020-08-02 DIAGNOSIS — I129 Hypertensive chronic kidney disease with stage 1 through stage 4 chronic kidney disease, or unspecified chronic kidney disease: Secondary | ICD-10-CM | POA: Diagnosis not present

## 2020-08-02 DIAGNOSIS — C61 Malignant neoplasm of prostate: Secondary | ICD-10-CM | POA: Diagnosis not present

## 2020-08-02 DIAGNOSIS — E1122 Type 2 diabetes mellitus with diabetic chronic kidney disease: Secondary | ICD-10-CM | POA: Diagnosis not present

## 2020-08-03 DIAGNOSIS — H26492 Other secondary cataract, left eye: Secondary | ICD-10-CM | POA: Diagnosis not present

## 2020-08-10 DIAGNOSIS — H26491 Other secondary cataract, right eye: Secondary | ICD-10-CM | POA: Diagnosis not present

## 2020-08-23 DIAGNOSIS — B351 Tinea unguium: Secondary | ICD-10-CM | POA: Diagnosis not present

## 2020-08-23 DIAGNOSIS — E1142 Type 2 diabetes mellitus with diabetic polyneuropathy: Secondary | ICD-10-CM | POA: Diagnosis not present

## 2020-09-01 DIAGNOSIS — C61 Malignant neoplasm of prostate: Secondary | ICD-10-CM | POA: Diagnosis not present

## 2020-09-01 DIAGNOSIS — I2510 Atherosclerotic heart disease of native coronary artery without angina pectoris: Secondary | ICD-10-CM | POA: Diagnosis not present

## 2020-09-01 DIAGNOSIS — E7849 Other hyperlipidemia: Secondary | ICD-10-CM | POA: Diagnosis not present

## 2020-09-01 DIAGNOSIS — E1122 Type 2 diabetes mellitus with diabetic chronic kidney disease: Secondary | ICD-10-CM | POA: Diagnosis not present

## 2020-09-02 ENCOUNTER — Ambulatory Visit (INDEPENDENT_AMBULATORY_CARE_PROVIDER_SITE_OTHER): Payer: PPO

## 2020-09-02 ENCOUNTER — Ambulatory Visit
Admission: EM | Admit: 2020-09-02 | Discharge: 2020-09-02 | Disposition: A | Discharge status: Home / Self Care | Payer: PPO | Attending: Emergency Medicine | Admitting: Emergency Medicine

## 2020-09-02 ENCOUNTER — Other Ambulatory Visit: Payer: Self-pay

## 2020-09-02 DIAGNOSIS — M25541 Pain in joints of right hand: Secondary | ICD-10-CM | POA: Diagnosis not present

## 2020-09-02 DIAGNOSIS — M25542 Pain in joints of left hand: Secondary | ICD-10-CM

## 2020-09-02 DIAGNOSIS — M79641 Pain in right hand: Secondary | ICD-10-CM | POA: Diagnosis not present

## 2020-09-02 DIAGNOSIS — M79642 Pain in left hand: Secondary | ICD-10-CM | POA: Diagnosis not present

## 2020-09-02 DIAGNOSIS — S6990XA Unspecified injury of unspecified wrist, hand and finger(s), initial encounter: Secondary | ICD-10-CM

## 2020-09-02 NOTE — ED Triage Notes (Signed)
Pt fell backwards catching himself on both hands, bilateral hand pain and some swelling noted

## 2020-09-02 NOTE — Discharge Instructions (Signed)
X-rays negative for fracture or dislocation Continue conservative management of rest, ice, and elevation Alternate ibuprofen and tylenol as needed for pain Follow up with PCP if symptoms persist Return or go to the ER if you have any new or worsening symptoms (fever, chills, chest pain, redness, swelling, bruising, etc...)

## 2020-09-02 NOTE — ED Provider Notes (Addendum)
[place]   [id number] 09/02/20 Arrival Time: 6301  CC: Bilateral hand pain  SUBJECTIVE: History from: patient. Grant [name] is a 86 y.o. male complains of bilateral hand pain that began 3 hours ago.  Fall backwards on bilateral hands.  Pain diffuse about the back of hands.  Describes the pain as intermittent and achy in character.  Has NOT tried OTC medications.  Symptoms are made worse to the touch.  Denies similar symptoms in the past.  Denies fever, chills, erythema, ecchymosis, effusion, weakness, numbness and tingling.  ROS: As per HPI.  All other pertinent ROS negative.     Past Medical History:  Diagnosis Date  . Arthritis   . CAD (coronary artery disease)   . Diabetes mellitus   . Erectile dysfunction   . GERD (gastroesophageal reflux disease)   . Gout 09-09-13   last flare 8'14 with gallbladder attack at [facility]  . Hyperlipidemia   . Hypertension   . Macular degeneration 09-09-13   bilateral"vision is poor"  . Pancreatitis 10--8-14   8'14 -enzymes improved  . Prostate cancer ([place])   . Psoriasis 09-09-13   elbows, knees  . RBBB   . Skin cancer    forehead  . Tubular adenoma    Past Surgical History:  Procedure Laterality Date  . CARDIAC CATHETERIZATION  02/19/2011   3 vessel CAD  . CATARACT EXTRACTION W/PHACO Left 02/14/2016   Procedure: CATARACT EXTRACTION PHACO AND INTRAOCULAR LENS PLACEMENT (IOC);  Surgeon: Rutherford Guys, MD;  Location: [facility];  Service: Ophthalmology;  Laterality: Left;  CDE:6.96  . CATARACT EXTRACTION W/PHACO Right 02/28/2016   Procedure: CATARACT EXTRACTION PHACO AND INTRAOCULAR LENS PLACEMENT (IOC);  Surgeon: Rutherford Guys, MD;  Location: [facility];  Service: Ophthalmology;  Laterality: Right;  CDE:9.54  . CHOLECYSTECTOMY N/A 10/27/2013   Procedure: LAPAROSCOPIC CHOLECYSTECTOMY WITH INTRAOPERATIVE CHOLANGIOGRAM WITH LIVER BIOPSY;  Surgeon: Pedro Earls, MD;  Location: [facility];  Service: General;  Laterality: N/A;  . COLONOSCOPY   02/25/2012   Procedure: COLONOSCOPY;  Surgeon: Daneil Dolin, MD;  [facility];  Service: Endoscopy;  Laterality: N/A;  7:30 AM polyps removed.  . COLONOSCOPY N/A 02/11/2015   Dr.Rourk- multiple colonic polyps bx= tubular adenoma and an inflammatory polyp  . CORONARY ARTERY BYPASS GRAFT  02-28-2011   LIMA to LAD,SVG to acute marginal branch of RCA & sequential SVG to OM1 & OM2.  . ESOPHAGOGASTRODUODENOSCOPY N/A 05/10/2016   RMR: mild schatzki ring, medium sized hh, H.pylori gastritis  . NM MYOCAR PERF WALL MOTION  02/15/2011   High Risk  . PILONIDAL CYST EXCISION    . US ECHOCARDIOGRAPHY  11/09/2008   mildly impaired diastolic dysfunction - Grade I   Allergies  Allergen Reactions  . Amiodarone Nausea Only   No current facility-administered medications on file prior to encounter.   Current Outpatient Medications on File Prior to Encounter  Medication Sig Dispense Refill  . allopurinol (ZYLOPRIM) 100 MG tablet Take 100 mg by mouth daily with breakfast.     . aspirin EC 81 MG tablet Take 1 tablet (81 mg total) by mouth daily.    Marland Kitchen atorvastatin (LIPITOR) 40 MG tablet Take 40 mg by mouth every evening.     . betamethasone dipropionate (DIPROLENE) 0.05 % cream Apply 1 application topically daily as needed (siriasis).   12  . blood glucose meter kit and supplies KIT Dispense based on patient and insurance preference. Use up to 2 times daily as directed. (FOR ICD-10 E11.65)  Talking blood glucose meter. 1 each 5  . carvedilol (COREG) 12.5 MG tablet Take 12.5 mg by mouth 2 (two) times daily with a meal.    . diazepam (VALIUM) 10 MG tablet Take 10 mg by mouth daily.    Regino Schultze TEST test strip USE TO TEST TWICE DAILY. 100 strip 5  . famotidine (PEPCID) 40 MG tablet     . insulin glargine (LANTUS SOLOSTAR) 100 UNIT/ML Solostar Pen Inject 22 Units into the skin at bedtime. 20 mL 0  . Insulin Pen Needle (PEN NEEDLES) 31G X 6 MM MISC 1 each by Does not apply route as directed. Use as directed  for lantus injection daily at bedtime 100 each 3  . Misc Natural Products (TART CHERRY ADVANCED PO) Take 1,200 mg by mouth daily.    . Multiple Vitamin (MULTIVITAMIN) tablet Take 1 tablet by mouth daily.    . Multiple Vitamins-Minerals (PRESERVISION AREDS PO) Take 1 capsule by mouth 2 (two) times daily.     . quinapril (ACCUPRIL) 20 MG tablet Take 20 mg by mouth every morning.     . tamsulosin (FLOMAX) 0.4 MG CAPS capsule Take 1 capsule (0.4 mg total) by mouth daily after supper. 30 capsule 5   Social History   Socioeconomic History  . Marital status: Married    Spouse name: Not on file  . Number of children: 1  . Years of education: Not on file  . Highest education level: Not on file  Occupational History    Comment: retired  Tobacco Use  . Smoking status: Former Smoker    Packs/day: 0.50    Years: 25.00    Pack years: 12.50    Types: Cigarettes    Quit date: 02/25/1983    Years since quitting: 37.5  . Smokeless tobacco: Never Used  Vaping Use  . Vaping Use: Never used  Substance and Sexual Activity  . Alcohol use: Yes    Comment: occasional  . Drug use: No  . Sexual activity: Not Currently  Other Topics Concern  . Not on file  Social History Narrative  . Not on file   Social Determinants of Health   Financial Resource Strain:   . Difficulty of Paying Living Expenses: Not on file  Food Insecurity:   . Worried About Charity fundraiser in the Last Year: Not on file  . Ran Out of Food in the Last Year: Not on file  Transportation Needs:   . Lack of Transportation (Medical): Not on file  . Lack of Transportation (Non-Medical): Not on file  Physical Activity:   . Days of Exercise per Week: Not on file  . Minutes of Exercise per Session: Not on file  Stress:   . Feeling of Stress : Not on file  Social Connections:   . Frequency of Communication with Friends and Family: Not on file  . Frequency of Social Gatherings with Friends and Family: Not on file  . Attends  Religious Services: Not on file  . Active Member of Clubs or Organizations: Not on file  . Attends Archivist Meetings: Not on file  . Marital Status: Not on file  Intimate Partner Violence:   . Fear of Current or Ex-Partner: Not on file  . Emotionally Abused: Not on file  . Physically Abused: Not on file  . Sexually Abused: Not on file   Family History  Problem Relation Age of Onset  . Stroke Father   . Leukemia Mother   .  Heart failure Brother   . Pancreatic cancer Sister   . Hypertension Sister   . Breast cancer Sister   . Heart failure Sister   . Anesthesia problems Neg Hx   . Hypotension Neg Hx   . Malignant hyperthermia Neg Hx   . Pseudochol deficiency Neg Hx   . Colon cancer Neg Hx   . Prostate cancer Neg Hx     OBJECTIVE:  Vitals:   09/02/20 1349  BP: (!) 114/57  Pulse: (!) 57  Resp: 18  Temp: 97.9 F (36.6 C)  SpO2: 98%    General appearance: ALERT; in no acute distress.  Head: NCAT Lungs: Normal respiratory effort CV: Radial pulse 2+; cap refill < 2 seconds Musculoskeletal: Bilateral Hand  Inspection: Skin warm, dry, clear and intact without obvious erythema, effusion, or ecchymosis.  Palpation: diffusely TTP over dorsal aspects of bilateral hands ROM: FROM active and passive Strength: grip strength equal bilaterally Skin: warm and dry Neurologic: Ambulates without difficulty Psychological: alert and cooperative; normal mood and affect  DIAGNOSTIC STUDIES:  DG Hand Complete Left  Result Date: 09/02/2020 CLINICAL DATA:  Bilateral hand pain after fall. EXAM: LEFT HAND - COMPLETE 3+ VIEW COMPARISON:  None. FINDINGS: There is no evidence of fracture or dislocation. Vascular calcifications are noted. Chondrocalcinosis of triangular fibrocartilage is noted. Joint spaces are otherwise unremarkable. IMPRESSION: No fracture or dislocation is noted. Chondrocalcinosis of triangular fibrocartilage is noted. Electronically Signed   By: Marijo Conception  M.D.   On: 09/02/2020 14:10   DG Hand Complete Right  Result Date: 09/02/2020 CLINICAL DATA:  Bilateral hand pain after fall. EXAM: RIGHT HAND - COMPLETE 3+ VIEW COMPARISON:  July 25, 2016. FINDINGS: There is no evidence of fracture or dislocation. Joint spaces are intact. Chondrocalcinosis of triangular fibrocartilage is noted. Probable small coiled wire is seen in soft tissues between second and third metacarpals concerning for possible foreign body. IMPRESSION: No fracture or dislocation is noted. Probable small coiled wire seen in soft tissues between second and third metacarpals concerning for possible foreign body. Electronically Signed   By: Marijo Conception M.D.   On: 09/02/2020 14:12    X-rays negative for bony abnormalities including fracture, or dislocation.   I have reviewed the x-rays myself and the radiologist interpretation. I am in agreement with the radiologist interpretation.     ASSESSMENT & PLAN:  1. Bilateral hand pain   2. Injury of hand, unspecified laterality, initial encounter    X-rays negative for fracture or dislocation Continue conservative management of rest, ice, and elevation Alternate ibuprofen and tylenol as needed for pain Follow up with PCP if symptoms persist Return or go to the ER if you have any new or worsening symptoms (fever, chills, chest pain, redness, swelling, bruising, etc...)   Patient reports retained FB from past machine accident to RT hand  Reviewed expectations re: course of current medical issues. Questions answered. Outlined signs and symptoms indicating need for more acute intervention. Patient verbalized understanding. After Visit Summary given.    Lestine Box, PA-C 09/02/20 [place], [place], PA-C 09/02/20 1429

## 2020-09-12 DIAGNOSIS — Z6827 Body mass index (BMI) 27.0-27.9, adult: Secondary | ICD-10-CM | POA: Diagnosis not present

## 2020-09-12 DIAGNOSIS — Z23 Encounter for immunization: Secondary | ICD-10-CM | POA: Diagnosis not present

## 2020-09-12 DIAGNOSIS — E663 Overweight: Secondary | ICD-10-CM | POA: Diagnosis not present

## 2020-09-12 DIAGNOSIS — S63501A Unspecified sprain of right wrist, initial encounter: Secondary | ICD-10-CM | POA: Diagnosis not present

## 2020-09-12 DIAGNOSIS — S63502A Unspecified sprain of left wrist, initial encounter: Secondary | ICD-10-CM | POA: Diagnosis not present

## 2020-09-27 ENCOUNTER — Other Ambulatory Visit: Payer: Self-pay

## 2020-09-27 ENCOUNTER — Encounter: Payer: Self-pay | Admitting: Urology

## 2020-09-27 ENCOUNTER — Ambulatory Visit (INDEPENDENT_AMBULATORY_CARE_PROVIDER_SITE_OTHER): Payer: PPO | Admitting: Urology

## 2020-09-27 VITALS — BP 146/61 | HR 73 | Temp 98.1°F | Ht 72.0 in | Wt 190.0 lb

## 2020-09-27 DIAGNOSIS — C61 Malignant neoplasm of prostate: Secondary | ICD-10-CM | POA: Diagnosis not present

## 2020-09-27 LAB — MICROSCOPIC EXAMINATION
Bacteria, UA: NONE SEEN
RBC, Urine: >30 /hpf — AB (ref 0–2)
Renal Epithel, UA: NONE SEEN /hpf
WBC, UA: NONE SEEN /hpf (ref 0–5)

## 2020-09-27 LAB — URINALYSIS, ROUTINE W REFLEX MICROSCOPIC
Bilirubin, UA: NEGATIVE
Glucose, UA: NEGATIVE
Leukocytes,UA: NEGATIVE
Nitrite, UA: NEGATIVE
Specific Gravity, UA: 1.015 (ref 1.005–1.030)
Urobilinogen, Ur: 1 mg/dL (ref 0.2–1.0)
pH, UA: 5.5 (ref 5.0–7.5)

## 2020-09-27 NOTE — Progress Notes (Signed)
Urological Symptom Review  Patient is experiencing the following symptoms: Get up at night to urinate Erection problems (male only)   Review of Systems  Gastrointestinal (upper)  : Negative for upper GI symptoms  Gastrointestinal (lower) : Negative for lower GI symptoms  Constitutional : Negative for symptoms  Skin: Negative for skin symptoms  Eyes: Negative for eye symptoms  Ear/Nose/Throat : Negative for Ear/Nose/Throat symptoms  Hematologic/Lymphatic: Negative for Hematologic/Lymphatic symptoms  Cardiovascular : Leg swelling  Respiratory : Negative for respiratory symptoms  Endocrine: Negative for endocrine symptoms  Musculoskeletal: Negative for musculoskeletal symptoms  Neurological: Negative for neurological symptoms  Psychologic: Negative for psychiatric symptoms  

## 2020-09-27 NOTE — Progress Notes (Signed)
History of Present Illness: 86 year old male presents for follow-up of prostate cancer.   TRUS.BX  on 9.21.2020.  At that time, PSA was 9.0.  Prostate volume was 21 g, PSAD 0.42.  4/cores revealed adenocarcinoma-2 revealed GS 4+3 pattern, 2 revealed GS 3+4 pattern.  He completed 28 EBRT treatments--completing these on 3.23.2021.This man is here today for follow-up of prostate cancer.  He underwent ultrasound and biopsy of his prostate on 08/18/2019.  At that time, PSA was 9.0.  Prostate volume was 21 g.  4/12 cores revealed adenocarcinoma-2 revealed GS 4+3 pattern, 2 revealed GS 3+4 pattern.  He completed 28 EBRT treatments--completing these on 3.23.2021.  6.29.2021: PSA 1.3  10.26.2021: IPSS 1   QoL score 2. No hematuria or blood in stool.    Past Surgical History:  Procedure Laterality Date  . CARDIAC CATHETERIZATION  02/19/2011   3 vessel CAD  . CATARACT EXTRACTION W/PHACO Left 02/14/2016   Procedure: CATARACT EXTRACTION PHACO AND INTRAOCULAR LENS PLACEMENT (IOC);  Surgeon: Rutherford Guys, MD;  Location: [facility];  Service: Ophthalmology;  Laterality: Left;  CDE:6.96  . CATARACT EXTRACTION W/PHACO Right 02/28/2016   Procedure: CATARACT EXTRACTION PHACO AND INTRAOCULAR LENS PLACEMENT (IOC);  Surgeon: Rutherford Guys, MD;  Location: [facility];  Service: Ophthalmology;  Laterality: Right;  CDE:9.54  . CHOLECYSTECTOMY N/A 10/27/2013   Procedure: LAPAROSCOPIC CHOLECYSTECTOMY WITH INTRAOPERATIVE CHOLANGIOGRAM WITH LIVER BIOPSY;  Surgeon: Pedro Earls, MD;  Location: [facility];  Service: General;  Laterality: N/A;  . COLONOSCOPY  02/25/2012   Procedure: COLONOSCOPY;  Surgeon: Daneil Dolin, MD;  [facility];  Service: Endoscopy;  Laterality: N/A;  7:30 AM polyps removed.  . COLONOSCOPY N/A 02/11/2015   Dr.Rourk- multiple colonic polyps bx= tubular adenoma and an inflammatory polyp  . CORONARY ARTERY BYPASS GRAFT  02-28-2011   LIMA to LAD,SVG to acute marginal branch of RCA & sequential  SVG to OM1 & OM2.  . ESOPHAGOGASTRODUODENOSCOPY N/A 05/10/2016   RMR: mild schatzki ring, medium sized hh, H.pylori gastritis  . NM MYOCAR PERF WALL MOTION  02/15/2011   High Risk  . PILONIDAL CYST EXCISION    . US ECHOCARDIOGRAPHY  11/09/2008   mildly impaired diastolic dysfunction - Grade I    Home Medications:  Allergies as of 09/27/2020      Reactions   Amiodarone Nausea Only      Medication List       Accurate as of September 27, 2020  8:50 AM. If you have any questions, ask your nurse or doctor.        allopurinol 100 MG tablet Commonly known as: ZYLOPRIM Take 100 mg by mouth daily with breakfast.   aspirin EC 81 MG tablet Take 1 tablet (81 mg total) by mouth daily.   atorvastatin 40 MG tablet Commonly known as: LIPITOR Take 40 mg by mouth every evening.   betamethasone dipropionate 0.05 % cream Apply 1 application topically daily as needed (siriasis).   blood glucose meter kit and supplies Kit Dispense based on patient and insurance preference. Use up to 2 times daily as directed. (FOR ICD-10 E11.65) Talking blood glucose meter.   carvedilol 12.5 MG tablet Commonly known as: COREG Take 12.5 mg by mouth 2 (two) times daily with a meal.   diazepam 10 MG tablet Commonly known as: VALIUM Take 10 mg by mouth daily.   EasyMax Test test strip Generic drug: glucose blood USE TO TEST TWICE DAILY.   famotidine 40 MG tablet Commonly known as: PEPCID  Lantus SoloStar 100 UNIT/ML Solostar Pen Generic drug: insulin glargine Inject 22 Units into the skin at bedtime.   multivitamin tablet Take 1 tablet by mouth daily.   Pen Needles 31G X 6 MM Misc 1 each by Does not apply route as directed. Use as directed for lantus injection daily at bedtime   PRESERVISION AREDS PO Take 1 capsule by mouth 2 (two) times daily.   quinapril 20 MG tablet Commonly known as: ACCUPRIL Take 20 mg by mouth every morning.   tamsulosin 0.4 MG Caps capsule Commonly known as:  Flomax Take 1 capsule (0.4 mg total) by mouth daily after supper.   TART CHERRY ADVANCED PO Take 1,200 mg by mouth daily.       Allergies:  Allergies  Allergen Reactions  . Amiodarone Nausea Only    Family History  Problem Relation Age of Onset  . Stroke Father   . Leukemia Mother   . Heart failure Brother   . Pancreatic cancer Sister   . Hypertension Sister   . Breast cancer Sister   . Heart failure Sister   . Anesthesia problems Neg Hx   . Hypotension Neg Hx   . Malignant hyperthermia Neg Hx   . Pseudochol deficiency Neg Hx   . Colon cancer Neg Hx   . Prostate cancer Neg Hx     Social History:  reports that he quit smoking about 37 years ago. His smoking use included cigarettes. He has a 12.50 pack-year smoking history. He has never used smokeless tobacco. He reports current alcohol use. He reports that he does not use drugs.  ROS: A complete review of systems was performed.  All systems are negative except for pertinent findings as noted.  Physical Exam:  Vital signs in last 24 hours: There were no vitals taken for this visit. Constitutional:  Alert and oriented, No acute distress Cardiovascular: Regular rate  Respiratory: Normal respiratory effort GI: Abdomen is soft, nontender, nondistended, no abdominal masses. No CVAT.  Genitourinary: Normal male phallus, testes are descended bilaterally and non-tender and without masses, scrotum is normal in appearance without lesions or masses, perineum is normal on inspection. Rectal--no masses, prostate smooth and flat. Lymphatic: No lymphadenopathy Neurologic: Grossly intact, no focal deficits Psychiatric: Normal mood and affect  I have reviewed prior pt notes  I have reviewed notes from referring/previous physicians  I have reviewed urinalysis results  I have independently reviewed prior imaging  I have reviewed prior PSA results   Impression/Assessment:  PCa--unfavorable intermediate risk, s/p EBRT w/  excellent responnse thus far  Plan:  1. PSA today  2. OV/PSA 4 mos--if continued good response lengthen interval of followup

## 2020-09-28 LAB — PSA: Prostate Specific Ag, Serum: 0.9 ng/mL (ref 0.0–4.0)

## 2020-09-29 ENCOUNTER — Telehealth: Payer: Self-pay

## 2020-09-29 NOTE — Telephone Encounter (Signed)
-----   Message from Franchot Gallo, MD sent at 09/29/2020 10:58 AM EDT ----- Notify pt--psa lower @ 0.9--good news ----- Message ----- From: Dorisann Frames, RN Sent: 09/28/2020  10:27 AM EDT To: Franchot Gallo, MD  Please review

## 2020-09-29 NOTE — Progress Notes (Signed)
Letter mailed

## 2020-09-29 NOTE — Telephone Encounter (Signed)
Lft msg for pt to call back 

## 2020-09-30 ENCOUNTER — Telehealth: Payer: Self-pay

## 2020-09-30 NOTE — Telephone Encounter (Signed)
Notified pt of PSA results.

## 2020-10-01 DIAGNOSIS — E1122 Type 2 diabetes mellitus with diabetic chronic kidney disease: Secondary | ICD-10-CM | POA: Diagnosis not present

## 2020-10-01 DIAGNOSIS — I2510 Atherosclerotic heart disease of native coronary artery without angina pectoris: Secondary | ICD-10-CM | POA: Diagnosis not present

## 2020-10-01 DIAGNOSIS — E7849 Other hyperlipidemia: Secondary | ICD-10-CM | POA: Diagnosis not present

## 2020-10-01 DIAGNOSIS — I129 Hypertensive chronic kidney disease with stage 1 through stage 4 chronic kidney disease, or unspecified chronic kidney disease: Secondary | ICD-10-CM | POA: Diagnosis not present

## 2020-11-01 ENCOUNTER — Other Ambulatory Visit: Payer: Self-pay

## 2020-11-01 ENCOUNTER — Encounter: Payer: Self-pay | Admitting: "Endocrinology

## 2020-11-01 ENCOUNTER — Ambulatory Visit: Payer: PPO | Admitting: "Endocrinology

## 2020-11-01 VITALS — BP 150/65 | HR 64 | Ht 72.0 in | Wt 191.0 lb

## 2020-11-01 DIAGNOSIS — E782 Mixed hyperlipidemia: Secondary | ICD-10-CM

## 2020-11-01 DIAGNOSIS — B351 Tinea unguium: Secondary | ICD-10-CM | POA: Diagnosis not present

## 2020-11-01 DIAGNOSIS — I10 Essential (primary) hypertension: Secondary | ICD-10-CM

## 2020-11-01 DIAGNOSIS — E1159 Type 2 diabetes mellitus with other circulatory complications: Secondary | ICD-10-CM | POA: Diagnosis not present

## 2020-11-01 DIAGNOSIS — E1142 Type 2 diabetes mellitus with diabetic polyneuropathy: Secondary | ICD-10-CM | POA: Diagnosis not present

## 2020-11-01 LAB — POCT GLYCOSYLATED HEMOGLOBIN (HGB A1C): Hemoglobin A1C: 6.8 % — AB (ref 4.0–5.6)

## 2020-11-01 MED ORDER — LANTUS SOLOSTAR 100 UNIT/ML ~~LOC~~ SOPN
20.0000 [IU] | PEN_INJECTOR | Freq: Every day | SUBCUTANEOUS | 1 refills | Status: DC
Start: 2020-11-01 — End: 2021-03-22

## 2020-11-01 NOTE — Patient Instructions (Signed)

## 2020-11-01 NOTE — Progress Notes (Signed)
11/01/2020  Endocrinology follow-up note  Subjective:    Patient ID: Grant [name], male    DOB: [birth year],    Past Medical History:  Diagnosis Date  . Arthritis   . CAD (coronary artery disease)   . Diabetes mellitus   . Erectile dysfunction   . GERD (gastroesophageal reflux disease)   . Gout 09-09-13   last flare 8'14 with gallbladder attack at [facility]  . Hyperlipidemia   . Hypertension   . Macular degeneration 09-09-13   bilateral"vision is poor"  . Pancreatitis 10--8-14   8'14 -enzymes improved  . Prostate cancer ([place])   . Psoriasis 09-09-13   elbows, knees  . RBBB   . Skin cancer    forehead  . Tubular adenoma    Past Surgical History:  Procedure Laterality Date  . CARDIAC CATHETERIZATION  02/19/2011   3 vessel CAD  . CATARACT EXTRACTION W/PHACO Left 02/14/2016   Procedure: CATARACT EXTRACTION PHACO AND INTRAOCULAR LENS PLACEMENT (IOC);  Surgeon: Rutherford Guys, MD;  Location: [facility];  Service: Ophthalmology;  Laterality: Left;  CDE:6.96  . CATARACT EXTRACTION W/PHACO Right 02/28/2016   Procedure: CATARACT EXTRACTION PHACO AND INTRAOCULAR LENS PLACEMENT (IOC);  Surgeon: Rutherford Guys, MD;  Location: [facility];  Service: Ophthalmology;  Laterality: Right;  CDE:9.54  . CHOLECYSTECTOMY N/A 10/27/2013   Procedure: LAPAROSCOPIC CHOLECYSTECTOMY WITH INTRAOPERATIVE CHOLANGIOGRAM WITH LIVER BIOPSY;  Surgeon: Pedro Earls, MD;  Location: [facility];  Service: General;  Laterality: N/A;  . COLONOSCOPY  02/25/2012   Procedure: COLONOSCOPY;  Surgeon: Daneil Dolin, MD;  Location: [facility] SU[facility];  Service: Endoscopy;  Laterality: N/A;  7:30 AM polyps removed.  . COLONOSCOPY N/A 02/11/2015   Dr.Rourk- multiple colonic polyps bx= tubular adenoma and an inflammatory polyp  . CORONARY ARTERY BYPASS GRAFT  02-28-2011   LIMA to LAD,SVG to acute marginal branch of RCA & sequential SVG to OM1 & OM2.  . ESOPHAGOGASTRODUODENOSCOPY N/A 05/10/2016   RMR: mild schatzki ring, medium sized hh, H.pylori  gastritis  . NM MYOCAR PERF WALL MOTION  02/15/2011   High Risk  . PILONIDAL CYST EXCISION    . US ECHOCARDIOGRAPHY  11/09/2008   mildly impaired diastolic dysfunction - Grade I   Social History   Socioeconomic History  . Marital status: Married    Spouse name: Not on file  . Number of children: 1  . Years of education: Not on file  . Highest education level: Not on file  Occupational History    Comment: retired  Tobacco Use  . Smoking status: Former Smoker    Packs/day: 0.50    Years: 25.00    Pack years: 12.50    Types: Cigarettes    Quit date: 02/25/1983    Years since quitting: 37.7  . Smokeless tobacco: Never Used  Vaping Use  . Vaping Use: Never used  Substance and Sexual Activity  . Alcohol use: Yes    Comment: occasional  . Drug use: No  . Sexual activity: Not Currently  Other Topics Concern  . Not on file  Social History Narrative  . Not on file   Social Determinants of Health   Financial Resource Strain:   . Difficulty of Paying Living Expenses: Not on file  Food Insecurity:   . Worried About Charity fundraiser in the Last Year: Not on file  . Ran Out of Food in the Last Year: Not on file  Transportation Needs:   . Lack of Transportation (Medical): Not on file  .  Lack of Transportation (Non-Medical): Not on file  Physical Activity:   . Days of Exercise per Week: Not on file  . Minutes of Exercise per Session: Not on file  Stress:   . Feeling of Stress : Not on file  Social Connections:   . Frequency of Communication with Friends and Family: Not on file  . Frequency of Social Gatherings with Friends and Family: Not on file  . Attends Religious Services: Not on file  . Active Member of Clubs or Organizations: Not on file  . Attends Archivist Meetings: Not on file  . Marital Status: Not on file   Outpatient Encounter Medications as of 11/01/2020  Medication Sig  . allopurinol (ZYLOPRIM) 100 MG tablet Take 100 mg by mouth daily with  breakfast.   . aspirin EC 81 MG tablet Take 1 tablet (81 mg total) by mouth daily.  Marland Kitchen atorvastatin (LIPITOR) 40 MG tablet Take 40 mg by mouth every evening.   . betamethasone dipropionate (DIPROLENE) 0.05 % cream Apply 1 application topically daily as needed (siriasis).   . blood glucose meter kit and supplies KIT Dispense based on patient and insurance preference. Use up to 2 times daily as directed. (FOR ICD-10 E11.65) Talking blood glucose meter.  . carvedilol (COREG) 12.5 MG tablet Take 12.5 mg by mouth 2 (two) times daily with a meal.   . EASYMAX TEST test strip USE TO TEST TWICE DAILY.  . famotidine (PEPCID) 40 MG tablet   . insulin glargine (LANTUS SOLOSTAR) 100 UNIT/ML Solostar Pen Inject 20 Units into the skin at bedtime.  . Insulin Pen Needle (PEN NEEDLES) 31G X 6 MM MISC 1 each by Does not apply route as directed. Use as directed for lantus injection daily at bedtime  . Misc Natural Products (TART CHERRY ADVANCED PO) Take 1,200 mg by mouth daily.   . Multiple Vitamin (MULTIVITAMIN) tablet Take 1 tablet by mouth daily.   . Multiple Vitamins-Minerals (PRESERVISION AREDS PO) Take 1 capsule by mouth 2 (two) times daily.   . quinapril (ACCUPRIL) 20 MG tablet Take 20 mg by mouth every morning.   . [DISCONTINUED] insulin glargine (LANTUS SOLOSTAR) 100 UNIT/ML Solostar Pen Inject 22 Units into the skin at bedtime.   No facility-administered encounter medications on file as of 11/01/2020.   ALLERGIES: Allergies  Allergen Reactions  . Amiodarone Nausea Only   VACCINATION STATUS: Immunization History  Administered Date(s) Administered  . Influenza-Unspecified 09/02/2013, 08/03/2018    Diabetes He presents for his follow-up diabetic visit. He has type 2 diabetes mellitus. Onset time: He was diagnosed at approximate age of 72 years. His disease course has been improving. There are no hypoglycemic associated symptoms. Pertinent negatives for hypoglycemia include no confusion, headaches,  pallor or seizures. There are no diabetic associated symptoms. Pertinent negatives for diabetes include no chest pain, no fatigue, no polydipsia, no polyphagia, no polyuria and no weakness. There are no hypoglycemic complications. Symptoms are improving. Diabetic complications include heart disease and retinopathy. Risk factors for coronary artery disease include dyslipidemia, diabetes mellitus, hypertension and tobacco exposure. He is compliant with treatment most of the time. His weight is fluctuating minimally. He is following a generally healthy diet. He has had a previous visit with a dietitian. He participates in exercise intermittently. His home blood glucose trend is decreasing steadily. His breakfast blood glucose range is generally 90-110 mg/dl. His overall blood glucose range is 110-130 mg/dl. Eye exam is current.  Hypertension This is a chronic problem. The current episode  started more than 1 year ago. Pertinent negatives include no chest pain, headaches, neck pain, palpitations or shortness of breath. Risk factors for coronary artery disease include dyslipidemia, diabetes mellitus, male gender, sedentary lifestyle and smoking/tobacco exposure. Hypertensive end-organ damage includes CAD/MI and retinopathy.  Hyperlipidemia This is a chronic problem. The current episode started more than 1 year ago. The problem is controlled. Pertinent negatives include no chest pain, myalgias or shortness of breath. Risk factors for coronary artery disease include diabetes mellitus, dyslipidemia, hypertension, a sedentary lifestyle and male sex.    Review of Systems  Constitutional: Negative for fatigue and unexpected weight change.  HENT: Negative for dental problem, mouth sores and trouble swallowing.   Eyes: Negative for visual disturbance.  Respiratory: Negative for cough, choking, chest tightness, shortness of breath and wheezing.   Cardiovascular: Negative for chest pain, palpitations and leg swelling.   Gastrointestinal: Negative for abdominal distention, abdominal pain, constipation, diarrhea, nausea and vomiting.  Endocrine: Negative for polydipsia, polyphagia and polyuria.  Genitourinary: Negative for dysuria, flank pain, hematuria and urgency.  Musculoskeletal: Negative for back pain, gait problem, myalgias and neck pain.  Skin: Negative for pallor, rash and wound.  Neurological: Negative for seizures, syncope, weakness, numbness and headaches.  Psychiatric/Behavioral: Negative.  Negative for confusion and dysphoric mood.    Objective:    BP (!) 150/65   Pulse 64   Ht 6' (1.829 m)   Wt 191 lb (86.6 kg)   BMI 25.90 kg/m   Wt Readings from Last 3 Encounters:  11/01/20 191 lb (86.6 kg)  09/27/20 190 lb (86.2 kg)  05/31/20 190 lb (86.2 kg)      Physical Exam- Limited  Constitutional:  Body mass index is 25.9 kg/m. , not in acute distress, normal state of mind Eyes:  EOMI, no exophthalmos Neck: Supple Thyroid: No gross goiter Respiratory: Adequate breathing efforts Musculoskeletal: + Pitting, dependent bilateral lower extremity edema.   no gross restriction of joint movements Skin:  no rashes, no hyperemia Neurological: no tremor with outstretched hands,     Results for orders placed or performed in visit on 11/01/20  HgB A1c  Result Value Ref Range   Hemoglobin A1C 6.8 (A) 4.0 - 5.6 %   HbA1c POC (<> result, manual entry)     HbA1c, POC (prediabetic range)     HbA1c, POC (controlled diabetic range)     Complete Blood Count (Most recent): Lab Results  Component Value Date   WBC 5.5 05/04/2016   HGB 10.7 (L) 05/04/2016   HCT 32.5 (L) 05/04/2016   MCV 92.1 05/04/2016   PLT 154 05/04/2016    Diabetic Labs (most recent): Lab Results  Component Value Date   HGBA1C 6.8 (A) 11/01/2020   HGBA1C 7.1 (A) 04/27/2020   HGBA1C 6.5 (A) 01/28/2020   Lipid Panel     Component Value Date/Time   CHOL 132 04/19/2020 1033   TRIG 46 04/19/2020 1033   HDL 49 04/19/2020  1033   CHOLHDL 2.7 04/19/2020 1033   VLDL 15 04/09/2017 0949   LDLCALC 70 04/19/2020 1033    Assessment & Plan:   1. Type 2 diabetes mellitus with vascular disease (HCC)  - His diabetes is  complicated by coronary artery disease. - He presents with tightly controlled fasting glycemic profile, point-of-care A1c of 6.8%, generally improving.    -His recent labs are reviewed with him and his wife who is his caretaker.  He denies recent hypoglycemia.  - Patient remains at a high risk for  more acute and chronic complications of diabetes which include CAD, CVA, CKD, retinopathy, and neuropathy. These are all discussed in detail with the patient.  - I have re-counseled the patient on diet management and   by adopting a carbohydrate restricted / protein rich  Diet. - Patient is advised to stick to a routine mealtimes to eat 3 meals  a day and avoid unnecessary snacks ( to snack only to correct hypoglycemia).  -  Suggestion is made for him to avoid simple carbohydrates  from his diet including Cakes, Sweet Desserts / Pastries, Ice Cream, Soda (diet and regular), Sweet Tea, Candies, Chips, Cookies, Sweet Pastries,  Store Bought Juices, Alcohol in Excess of  1-2 drinks a day, Artificial Sweeteners, Coffee Creamer, and "Sugar-free" Products. This will help patient to have stable blood glucose profile and potentially avoid unintended weight gain.   - I have approached patient with the following individualized plan to manage diabetes and patient agrees.   -Based on his presentation with controlled glycemic profile, he will not need prandial insulin for now.  He is advised to lower his Lantus to 20 units nightly,  continue monitoring blood glucose 2 times a day-daily before breakfast and at bedtime.    - Target A1c for him would be between 7 and 7.5%.  - Patient specific target  for A1c; LDL, HDL, Triglycerides, and  Waist Circumference were discussed in detail.  2) BP/HTN:  -His blood pressure is  controlled to target -  He is advised to continue his current medications including  lisinopril 20 mg p.o. daily.   3) Lipids/HPL: Recent lipid panel showed controlled with LDL of 84.  He is advised to continue atorvastatin 40 mg p.o. nightly.       4)  Weight/Diet:  His BMI is 94- he is not a candidate for weight loss. CDE consult in progress, exercise, and carbohydrates information provided.  5) Chronic Care/Health Maintenance:  -Patient  on ACEI and Statin medications and encouraged to continue to follow up with Ophthalmology (he is recently diagnosed with macular degeneration), Podiatrist at least yearly or according to recommendations, and advised to  stay away from smoking. I have recommended yearly flu vaccine and pneumonia vaccination at least every 5 years; moderate intensity exercise for up to 150 minutes weekly; and  sleep for at least 7 hours a day. -He is advised to wear compression socks to prevent dependent edema on lower extremities.  POCT ABI Results 11/01/20  His ABI is normal Right ABI: 1.2      left ABI: 1.19  Right leg systolic / diastolic: 099/83 mmHg Left leg systolic / diastolic: 382/50 mmHg  Arm systolic / diastolic: 539/76 mmHG This study will be repeated in November 2026, or sooner if needed. I advised patient to maintain close follow up with his PCP for primary care needs.  - Time spent on this patient care encounter:  35 min, of which > 50% was spent in  counseling and the rest reviewing his blood glucose logs , discussing his hypoglycemia and hyperglycemia episodes, reviewing his current and  previous labs / studies  ( including abstraction from other facilities) and medications  doses and developing a  long term treatment plan and documenting his care.   Please refer to Patient Instructions for Blood Glucose Monitoring and Insulin/Medications Dosing Guide"  in media tab for additional information. Please  also refer to " Patient Self Inventory" in the Media   tab for reviewed elements of pertinent patient history.  Grant [name] participated in the discussions, expressed understanding, and voiced agreement with the above plans.  All questions were answered to his satisfaction. he is encouraged to contact clinic should he have any questions or concerns prior to his return visit.   Follow up plan: Return in about 6 months (around 05/01/2021) for F/U with Pre-visit Labs, Meter, Logs, A1c here.Glade Lloyd, MD Phone: [phone]  Fax: [phone]   This note was partially dictated with voice recognition software. Similar sounding words can be transcribed inadequately or may not  be corrected upon review.  11/01/2020, 2:33 PM

## 2020-11-09 DIAGNOSIS — E663 Overweight: Secondary | ICD-10-CM | POA: Diagnosis not present

## 2020-11-09 DIAGNOSIS — Z6828 Body mass index (BMI) 28.0-28.9, adult: Secondary | ICD-10-CM | POA: Diagnosis not present

## 2020-11-09 DIAGNOSIS — F0390 Unspecified dementia without behavioral disturbance: Secondary | ICD-10-CM | POA: Diagnosis not present

## 2020-11-09 DIAGNOSIS — M542 Cervicalgia: Secondary | ICD-10-CM | POA: Diagnosis not present

## 2020-11-22 ENCOUNTER — Other Ambulatory Visit: Payer: Self-pay | Admitting: Physician Assistant

## 2020-11-22 ENCOUNTER — Other Ambulatory Visit (HOSPITAL_COMMUNITY): Payer: Self-pay | Admitting: Physician Assistant

## 2020-11-22 DIAGNOSIS — W19XXXD Unspecified fall, subsequent encounter: Secondary | ICD-10-CM | POA: Diagnosis not present

## 2020-11-22 DIAGNOSIS — Z6827 Body mass index (BMI) 27.0-27.9, adult: Secondary | ICD-10-CM | POA: Diagnosis not present

## 2020-11-22 DIAGNOSIS — R413 Other amnesia: Secondary | ICD-10-CM

## 2020-11-22 DIAGNOSIS — E119 Type 2 diabetes mellitus without complications: Secondary | ICD-10-CM | POA: Diagnosis not present

## 2020-12-01 ENCOUNTER — Other Ambulatory Visit: Payer: Self-pay | Admitting: "Endocrinology

## 2020-12-16 ENCOUNTER — Other Ambulatory Visit: Payer: Self-pay

## 2020-12-16 ENCOUNTER — Ambulatory Visit (HOSPITAL_COMMUNITY)
Admission: RE | Admit: 2020-12-16 | Discharge: 2020-12-16 | Disposition: A | Discharge status: Home / Self Care | Payer: PPO | Source: Ambulatory Visit | Attending: Physician Assistant | Admitting: Physician Assistant

## 2020-12-16 DIAGNOSIS — I6782 Cerebral ischemia: Secondary | ICD-10-CM | POA: Diagnosis not present

## 2020-12-16 DIAGNOSIS — R413 Other amnesia: Secondary | ICD-10-CM | POA: Diagnosis not present

## 2020-12-16 DIAGNOSIS — R410 Disorientation, unspecified: Secondary | ICD-10-CM | POA: Diagnosis not present

## 2021-01-10 ENCOUNTER — Other Ambulatory Visit: Payer: Self-pay

## 2021-01-10 ENCOUNTER — Encounter: Payer: Self-pay | Admitting: Cardiovascular Disease

## 2021-01-10 ENCOUNTER — Ambulatory Visit: Payer: PPO | Admitting: Cardiovascular Disease

## 2021-01-10 VITALS — BP 134/52 | HR 61 | Ht 72.0 in | Wt 183.6 lb

## 2021-01-10 DIAGNOSIS — E7800 Pure hypercholesterolemia, unspecified: Secondary | ICD-10-CM | POA: Diagnosis not present

## 2021-01-10 DIAGNOSIS — R4189 Other symptoms and signs involving cognitive functions and awareness: Secondary | ICD-10-CM

## 2021-01-10 DIAGNOSIS — I2510 Atherosclerotic heart disease of native coronary artery without angina pectoris: Secondary | ICD-10-CM | POA: Diagnosis not present

## 2021-01-10 DIAGNOSIS — I10 Essential (primary) hypertension: Secondary | ICD-10-CM

## 2021-01-10 DIAGNOSIS — E1142 Type 2 diabetes mellitus with diabetic polyneuropathy: Secondary | ICD-10-CM | POA: Diagnosis not present

## 2021-01-10 DIAGNOSIS — I453 Trifascicular block: Secondary | ICD-10-CM | POA: Diagnosis not present

## 2021-01-10 DIAGNOSIS — E119 Type 2 diabetes mellitus without complications: Secondary | ICD-10-CM

## 2021-01-10 DIAGNOSIS — B351 Tinea unguium: Secondary | ICD-10-CM | POA: Diagnosis not present

## 2021-01-10 NOTE — Patient Instructions (Signed)

## 2021-01-10 NOTE — Progress Notes (Signed)
Cardiology Office Note    Date:  01/15/2021   ID:  Grant [name], DOB 10-Aug-1934, MRN [id number]  PCP:  Redmond School, MD  Cardiologist:   Sanda Klein, MD   Chief Complaint  Patient presents with  . Coronary Artery Disease    History of Present Illness:  Grant [name] is a 87 y.o. male of coronary artery disease who is now almost 10 years status post multivessel bypass surgery and also has hyperlipidemia, hypertension, mild diabetes mellitus, history of gout and advanced macular degeneration.   He has not had any cardiovascular problems.  The patient specifically denies any chest pain at rest exertion, dyspnea at rest or with exertion, orthopnea, paroxysmal nocturnal dyspnea, syncope, palpitations, focal neurological deficits, intermittent claudication, lower extremity edema, unexplained weight gain, cough, hemoptysis or wheezing.  He has had a couple of falls.  Last October he had a hand injury that required treatment and he has also had one injury with head impact but the CT scan thankfully showed no problems.  His wife is concerned about his memory issues.  On a couple of occasions he is called her by his sister's name.  He is also more forgetful than usual and repeats himself occasionally.  Mr. Craine gets a little irritated and defensive when she confronted him about his this and he tells her that "she makes the same mistakes too".  He has significant visual difficulties from macular degeneration and I am sure this has a big impact on his cognitive function.  He has completed treatment for prostate cancer.  His recent PSA was only 0.9.  Despite not taking medications for diabetes, his hemoglobin A1c is quite good at 6.8%.  His most recent lipid profile showed an LDL of 70.  He has very mild renal dysfunction.  Mr. Crosson underwent four-vessel bypass surgery in 2012 (LIMA to LAD, SVG to acute marginal-RCA, sequential SVG to OM1 and OM 2) by Dr. Roxan Hockey. He has treated  hyperlipidemia, hypertension, type 2 diabetes mellitus and history of gout. He has a chronic right bundle branch block and in 2015 also developed a left anterior fascicular block and prolonged PR interval at approx. 250 ms.  Past Medical History:  Diagnosis Date  . Arthritis   . CAD (coronary artery disease)   . Diabetes mellitus   . Erectile dysfunction   . GERD (gastroesophageal reflux disease)   . Gout 09-09-13   last flare 8'14 with gallbladder attack at [facility]  . Hyperlipidemia   . Hypertension   . Macular degeneration 09-09-13   bilateral"vision is poor"  . Pancreatitis 10--8-14   8'14 -enzymes improved  . Prostate cancer ([place])   . Psoriasis 09-09-13   elbows, knees  . RBBB   . Skin cancer    forehead  . Tubular adenoma     Past Surgical History:  Procedure Laterality Date  . CARDIAC CATHETERIZATION  02/19/2011   3 vessel CAD  . CATARACT EXTRACTION W/PHACO Left 02/14/2016   Procedure: CATARACT EXTRACTION PHACO AND INTRAOCULAR LENS PLACEMENT (IOC);  Surgeon: Rutherford Guys, MD;  Location: [facility];  Service: Ophthalmology;  Laterality: Left;  CDE:6.96  . CATARACT EXTRACTION W/PHACO Right 02/28/2016   Procedure: CATARACT EXTRACTION PHACO AND INTRAOCULAR LENS PLACEMENT (IOC);  Surgeon: Rutherford Guys, MD;  Location: [facility];  Service: Ophthalmology;  Laterality: Right;  CDE:9.54  . CHOLECYSTECTOMY N/A 10/27/2013   Procedure: LAPAROSCOPIC CHOLECYSTECTOMY WITH INTRAOPERATIVE CHOLANGIOGRAM WITH LIVER BIOPSY;  Surgeon: Pedro Earls, MD;  Location: [facility];  Service: General;  Laterality: N/A;  . COLONOSCOPY  02/25/2012   Procedure: COLONOSCOPY;  Surgeon: Daneil Dolin, MD;  Location: [facility];  Service: Endoscopy;  Laterality: N/A;  7:30 AM polyps removed.  . COLONOSCOPY N/A 02/11/2015   Dr.Rourk- multiple colonic polyps bx= tubular adenoma and an inflammatory polyp  . CORONARY ARTERY BYPASS GRAFT  02-28-2011   LIMA to LAD,SVG to acute marginal branch of RCA & sequential SVG to OM1 &  OM2.  . ESOPHAGOGASTRODUODENOSCOPY N/A 05/10/2016   RMR: mild schatzki ring, medium sized hh, H.pylori gastritis  . NM MYOCAR PERF WALL MOTION  02/15/2011   High Risk  . PILONIDAL CYST EXCISION    . US ECHOCARDIOGRAPHY  11/09/2008   mildly impaired diastolic dysfunction - Grade I    Current Medications: Outpatient Medications Prior to Visit  Medication Sig Dispense Refill  . allopurinol (ZYLOPRIM) 100 MG tablet Take 100 mg by mouth daily with breakfast.     . aspirin EC 81 MG tablet Take 1 tablet (81 mg total) by mouth daily.    Marland Kitchen atorvastatin (LIPITOR) 40 MG tablet Take 40 mg by mouth every evening.     . betamethasone dipropionate (DIPROLENE) 0.05 % cream Apply 1 application topically daily as needed (siriasis).   12  . blood glucose meter kit and supplies KIT Dispense based on patient and insurance preference. Use up to 2 times daily as directed. (FOR ICD-10 E11.65) Talking blood glucose meter. 1 each 5  . carvedilol (COREG) 12.5 MG tablet Take 12.5 mg by mouth 2 (two) times daily with a meal.     . donepezil (ARICEPT) 5 MG tablet Take 5 mg by mouth daily.    Regino Schultze TEST test strip USE TO TEST TWICE DAILY. 200 strip 2  . famotidine (PEPCID) 40 MG tablet Take 40 mg by mouth 2 (two) times daily.    . insulin glargine (LANTUS SOLOSTAR) 100 UNIT/ML Solostar Pen Inject 20 Units into the skin at bedtime. 15 mL 1  . Insulin Pen Needle (PEN NEEDLES) 31G X 6 MM MISC 1 each by Does not apply route as directed. Use as directed for lantus injection daily at bedtime 100 each 3  . Misc Natural Products (TART CHERRY ADVANCED PO) Take 1,200 mg by mouth daily.     . Multiple Vitamin (MULTIVITAMIN) tablet Take 1 tablet by mouth daily.     . Multiple Vitamins-Minerals (PRESERVISION AREDS PO) Take 1 capsule by mouth 2 (two) times daily.     . quinapril (ACCUPRIL) 20 MG tablet Take 20 mg by mouth every morning.      No facility-administered medications prior to visit.     Allergies:   Amiodarone    Social History   Socioeconomic History  . Marital status: Married    Spouse name: Not on file  . Number of children: 1  . Years of education: Not on file  . Highest education level: Not on file  Occupational History    Comment: retired  Tobacco Use  . Smoking status: Former Smoker    Packs/day: 0.50    Years: 25.00    Pack years: 12.50    Types: Cigarettes    Quit date: 02/25/1983    Years since quitting: 37.9  . Smokeless tobacco: Never Used  Vaping Use  . Vaping Use: Never used  Substance and Sexual Activity  . Alcohol use: Yes    Comment: occasional  . Drug use: No  . Sexual activity: Not Currently  Other Topics  Concern  . Not on file  Social History Narrative  . Not on file   Social Determinants of Health   Financial Resource Strain: Not on file  Food Insecurity: Not on file  Transportation Needs: Not on file  Physical Activity: Not on file  Stress: Not on file  Social Connections: Not on file     Family History:  The patient's family history includes Breast cancer in his sister; Heart failure in his brother and sister; Hypertension in his sister; Leukemia in his mother; Pancreatic cancer in his sister; Stroke in his father.   ROS:   Please see the history of present illness.    ROS All other systems are reviewed and are negative.   PHYSICAL EXAM:   VS:  BP (!) 134/52 (BP Location: Left Arm, Patient Position: Sitting)   Pulse 61   Ht 6' (1.829 m)   Wt 183 lb 9.6 oz (83.3 kg)   SpO2 98%   BMI 24.90 kg/m      General: Alert, oriented x3, no distress, appears well Head: no evidence of trauma, PERRL, EOMI, no exophtalmos or lid lag, no myxedema, no xanthelasma; normal ears, nose and oropharynx Neck: normal jugular venous pulsations and no hepatojugular reflux; brisk carotid pulses without delay and no carotid bruits Chest: clear to auscultation, no signs of consolidation by percussion or palpation, normal fremitus, symmetrical and full respiratory  excursions Cardiovascular: normal position and quality of the apical impulse, regular rhythm, normal first and second heart sounds, no murmurs, rubs or gallops Abdomen: no tenderness or distention, no masses by palpation, no abnormal pulsatility or arterial bruits, normal bowel sounds, no hepatosplenomegaly Extremities: no clubbing, cyanosis or edema; 2+ radial, ulnar and brachial pulses bilaterally; 2+ right femoral, posterior tibial and dorsalis pedis pulses; 2+ left femoral, posterior tibial and dorsalis pedis pulses; no subclavian or femoral bruits Neurological: grossly nonfocal Psych: Normal mood and affect    Wt Readings from Last 3 Encounters:  01/10/21 183 lb 9.6 oz (83.3 kg)  11/01/20 191 lb (86.6 kg)  09/27/20 190 lb (86.2 kg)      Studies/Labs Reviewed:   EKG:  EKG is ordered today.  It is very similar to previous tracings and shows sinus rhythm with long AV conduction time (PR 228 ms) and bifascicular block (RBBB plus LAFB).  There are no ischemic repolarization changes.  QTc 461 ms, minimally prolonged appropriate for the IVCD.  Recent Labs: 04/19/2020: ALT 20; BUN 29; Creat 1.28; Potassium 4.4; Sodium 140; TSH 2.50   Lipid Panel     Component Value Date/Time   CHOL 132 04/19/2020 1033   TRIG 46 04/19/2020 1033   HDL 49 04/19/2020 1033   CHOLHDL 2.7 04/19/2020 1033   VLDL 15 04/09/2017 0949   LDLCALC 70 04/19/2020 1033   ASSESSMENT:    1. Coronary artery disease involving native coronary artery of native heart without angina pectoris   2. Essential hypertension   3. Hypercholesterolemia   4. Diabetes mellitus type 2 in nonobese (HCC)   5. Trifascicular block   6. Cognitive deficits      PLAN:  In order of problems listed above:  1. CAD s/p CABG: Just about 10 years since surgery, he is completely asymptomatic. 2. HTN: Well-controlled.  In fact to be cautious with his rather low diastolic blood pressure which may predispose to falls. 3. HLP: All lipid  parameters in target range on current statin dose.  Continue same medication. 4. DM: Diet controlled. 5. Trifascicular block: His  falls were not related to syncope he has not had other reasons to believe he has high grade AV block. 6. Macular degeneration with severe visual impairment and cognitive deficits I am sure these are making each other worse.  Suggested that he have a Mini-Mental status evaluation.    Medication Adjustments/Labs and Tests Ordered: Current medicines are reviewed at length with the patient today.  Concerns regarding medicines are outlined above.  Medication changes, Labs and Tests ordered today are listed in the Patient Instructions below. Patient Instructions  Medication Instructions:  No changes *If you need a refill on your cardiac medications before your next appointment, please call your pharmacy*   Lab Work: None ordered If you have labs (blood work) drawn today and your tests are completely normal, you will receive your results only by: Marland Kitchen MyChart Message (if you have MyChart) OR . A paper copy in the mail If you have any lab test that is abnormal or we need to change your treatment, we will call you to review the results.   Testing/Procedures: None ordered   Follow-Up: At [facility], you and your health needs are our priority.  As part of our continuing mission to provide you with exceptional heart care, we have created designated Provider Care Teams.  These Care Teams include your primary Cardiologist (physician) and Advanced Practice Providers (APPs -  Physician Assistants and Nurse Practitioners) who all work together to provide you with the care you need, when you need it.  We recommend signing up for the patient portal called "MyChart".  Sign up information is provided on this After Visit Summary.  MyChart is used to connect with patients for Virtual Visits (Telemedicine).  Patients are able to view lab/test results, encounter notes, upcoming  appointments, etc.  Non-urgent messages can be sent to your provider as well.   To learn more about what you can do with MyChart, go to [email or web address].    Your next appointment:   12 month(s)  The format for your next appointment:   In Person  Provider:   You may see Sanda Klein, MD or one of the following Advanced Practice Providers on your designated Care Team:    Almyra Deforest, PA-C  Fabian Sharp, [place] or   Roby Lofts, PA-C       Signed, Sanda Klein, MD  01/15/2021 4:50 PM    [address], [place], [place]  [address] Phone: [phone]; Fax: [phone]

## 2021-01-15 ENCOUNTER — Encounter: Payer: Self-pay | Admitting: Cardiovascular Disease

## 2021-01-17 ENCOUNTER — Encounter: Payer: Self-pay | Admitting: Urology

## 2021-01-17 ENCOUNTER — Ambulatory Visit (INDEPENDENT_AMBULATORY_CARE_PROVIDER_SITE_OTHER): Payer: PPO | Admitting: Urology

## 2021-01-17 ENCOUNTER — Other Ambulatory Visit: Payer: Self-pay

## 2021-01-17 VITALS — BP 167/70 | HR 67 | Temp 98.2°F | Ht 71.0 in | Wt 183.0 lb

## 2021-01-17 DIAGNOSIS — C61 Malignant neoplasm of prostate: Secondary | ICD-10-CM | POA: Diagnosis not present

## 2021-01-17 DIAGNOSIS — R3129 Other microscopic hematuria: Secondary | ICD-10-CM

## 2021-01-17 LAB — URINALYSIS, ROUTINE W REFLEX MICROSCOPIC
Bilirubin, UA: NEGATIVE
Glucose, UA: NEGATIVE
Ketones, UA: NEGATIVE
Nitrite, UA: NEGATIVE
Specific Gravity, UA: 1.015 (ref 1.005–1.030)
Urobilinogen, Ur: 1 mg/dL (ref 0.2–1.0)
pH, UA: 5.5 (ref 5.0–7.5)

## 2021-01-17 LAB — MICROSCOPIC EXAMINATION
Bacteria, UA: NONE SEEN
RBC, Urine: >30 /hpf — AB (ref 0–2)
Renal Epithel, UA: NONE SEEN /hpf

## 2021-01-17 NOTE — Progress Notes (Signed)
Urological Symptom Review  Patient is experiencing the following symptoms: Get up at night to urinate   Review of Systems  Gastrointestinal (upper)  : Negative for upper GI symptoms  Gastrointestinal (lower) : Negative for lower GI symptoms  Constitutional : Negative for symptoms  Skin: Negative for skin symptoms  Eyes: Negative for eye symptoms  Ear/Nose/Throat : Negative for Ear/Nose/Throat symptoms  Hematologic/Lymphatic: Negative for Hematologic/Lymphatic symptoms  Cardiovascular : Leg swelling  Respiratory : Negative for respiratory symptoms  Endocrine: Negative for endocrine symptoms  Musculoskeletal: Back pain Joint pain  Neurological: Negative for neurological symptoms  Psychologic: Negative for psychiatric symptoms

## 2021-01-17 NOTE — Progress Notes (Signed)
H&P  Chief Complaint: Prostate Cancer Treatment  History of Present Illness:  87 year old male presents for follow-up of prostate cancer.   TRUS.BX  on 9.21.2020. At that time, PSA was 9.0. Prostate volume was 21 g, PSAD 0.42. 4/cores revealed adenocarcinoma-2 revealed GS 4+3 pattern, 2 revealed GS 3+4 pattern.  He completed28EBRT treatments--completing these on3.23.2021.This man is here today for follow-up of prostate cancer. He underwent ultrasound and biopsy of his prostate on 08/18/2019. At that time, PSA was 9.0. Prostate volume was 21 g. 4/12 cores revealed adenocarcinoma-2 revealed GS 4+3 pattern, 2 revealed GS 3+4 pattern.  He completed28EBRT treatments--completing these on3.23.2021.  6.29.2021: PSA 1.3  10.26.2021: IPSS 1   QoL score 2. No hematuria or blood in stool.  2.15.2022: Grant [name] is here today for a regularly scheduled 4 month f/u. He denies any significant change in LUTS between visits. He has a good FOS and feels his is able to empty his bladder completely more times than not. He denies any blood per urine or stool.  IPSS Questionnaire (AUA-7): Over the past month.   1)  How often have you had a sensation of not emptying your bladder completely after you finish urinating?  2 - Less than half the time  2)  How often have you had to urinate again less than two hours after you finished urinating? 2 - Less than half the time  3)  How often have you found you stopped and started again several times when you urinated?  2 - Less than half the time  4) How difficult have you found it to postpone urination?  1 - Less than 1 time in 5  5) How often have you had a weak urinary stream?  0 - Not at all  6) How often have you had to push or strain to begin urination?  0 - Not at all  7) How many times did you most typically get up to urinate from the time you went to bed until the time you got up in the morning?  1 - 1 time  Total score:  0-7 mildly symptomatic    8-19 moderately symptomatic   20-35 severely symptomatic  QoL Score: 1   Past Medical History:  Diagnosis Date  . Arthritis   . CAD (coronary artery disease)   . Diabetes mellitus   . Erectile dysfunction   . GERD (gastroesophageal reflux disease)   . Gout 09-09-13   last flare 8'14 with gallbladder attack at [facility]  . Hyperlipidemia   . Hypertension   . Macular degeneration 09-09-13   bilateral"vision is poor"  . Pancreatitis 10--8-14   8'14 -enzymes improved  . Prostate cancer ([place])   . Psoriasis 09-09-13   elbows, knees  . RBBB   . Skin cancer    forehead  . Tubular adenoma     Past Surgical History:  Procedure Laterality Date  . CARDIAC CATHETERIZATION  02/19/2011   3 vessel CAD  . CATARACT EXTRACTION W/PHACO Left 02/14/2016   Procedure: CATARACT EXTRACTION PHACO AND INTRAOCULAR LENS PLACEMENT (IOC);  Surgeon: Rutherford Guys, MD;  Location: [facility];  Service: Ophthalmology;  Laterality: Left;  CDE:6.96  . CATARACT EXTRACTION W/PHACO Right 02/28/2016   Procedure: CATARACT EXTRACTION PHACO AND INTRAOCULAR LENS PLACEMENT (IOC);  Surgeon: Rutherford Guys, MD;  Location: [facility];  Service: Ophthalmology;  Laterality: Right;  CDE:9.54  . CHOLECYSTECTOMY N/A 10/27/2013   Procedure: LAPAROSCOPIC CHOLECYSTECTOMY WITH INTRAOPERATIVE CHOLANGIOGRAM WITH LIVER BIOPSY;  Surgeon: Pedro Earls, MD;  Location:  [facility];  Service: General;  Laterality: N/A;  . COLONOSCOPY  02/25/2012   Procedure: COLONOSCOPY;  Surgeon: Daneil Dolin, MD;  [facility];  Service: Endoscopy;  Laterality: N/A;  7:30 AM polyps removed.  . COLONOSCOPY N/A 02/11/2015   Dr.Rourk- multiple colonic polyps bx= tubular adenoma and an inflammatory polyp  . CORONARY ARTERY BYPASS GRAFT  02-28-2011   LIMA to LAD,SVG to acute marginal branch of RCA & sequential SVG to OM1 & OM2.  . ESOPHAGOGASTRODUODENOSCOPY N/A 05/10/2016   RMR: mild schatzki ring, medium sized hh, H.pylori gastritis  . NM MYOCAR PERF WALL MOTION   02/15/2011   High Risk  . PILONIDAL CYST EXCISION    . US ECHOCARDIOGRAPHY  11/09/2008   mildly impaired diastolic dysfunction - Grade I    Home Medications:  Allergies as of 01/17/2021      Reactions   Amiodarone Nausea Only      Medication List       Accurate as of January 17, 2021  1:24 PM. If you have any questions, ask your nurse or doctor.        allopurinol 100 MG tablet Commonly known as: ZYLOPRIM Take 100 mg by mouth daily with breakfast.   aspirin EC 81 MG tablet Take 1 tablet (81 mg total) by mouth daily.   atorvastatin 40 MG tablet Commonly known as: LIPITOR Take 40 mg by mouth every evening.   betamethasone dipropionate 0.05 % cream Apply 1 application topically daily as needed (siriasis).   blood glucose meter kit and supplies Kit Dispense based on patient and insurance preference. Use up to 2 times daily as directed. (FOR ICD-10 E11.65) Talking blood glucose meter.   carvedilol 12.5 MG tablet Commonly known as: COREG Take 12.5 mg by mouth 2 (two) times daily with a meal.   donepezil 5 MG tablet Commonly known as: ARICEPT Take 5 mg by mouth daily.   EasyMax Test test strip Generic drug: glucose blood USE TO TEST TWICE DAILY.   famotidine 40 MG tablet Commonly known as: PEPCID Take 40 mg by mouth 2 (two) times daily.   Lantus SoloStar 100 UNIT/ML Solostar Pen Generic drug: insulin glargine Inject 20 Units into the skin at bedtime.   multivitamin tablet Take 1 tablet by mouth daily.   Pen Needles 31G X 6 MM Misc 1 each by Does not apply route as directed. Use as directed for lantus injection daily at bedtime   PRESERVISION AREDS PO Take 1 capsule by mouth 2 (two) times daily.   quinapril 20 MG tablet Commonly known as: ACCUPRIL Take 20 mg by mouth every morning.   TART CHERRY ADVANCED PO Take 1,200 mg by mouth daily.       Allergies:  Allergies  Allergen Reactions  . Amiodarone Nausea Only    Family History  Problem Relation  Age of Onset  . Stroke Father   . Leukemia Mother   . Heart failure Brother   . Pancreatic cancer Sister   . Hypertension Sister   . Breast cancer Sister   . Heart failure Sister   . Anesthesia problems Neg Hx   . Hypotension Neg Hx   . Malignant hyperthermia Neg Hx   . Pseudochol deficiency Neg Hx   . Colon cancer Neg Hx   . Prostate cancer Neg Hx     Social History:  reports that he quit smoking about 37 years ago. His smoking use included cigarettes. He has a 12.50 pack-year smoking history. He has  never used smokeless tobacco. He reports current alcohol use. He reports that he does not use drugs.  ROS: A complete review of systems was performed.  All systems are negative except for pertinent findings as noted.  Physical Exam:  Vital signs in last 24 hours: There were no vitals taken for this visit. Constitutional:  Alert and oriented, No acute distress Cardiovascular: Regular rate  GI: Abdomen is soft, nontender, nondistended, no abdominal masses. No CVAT. Genitourinary: Normal male phallus, testes are descended bilaterally and non-tender and without masses, scrotum is normal in appearance without lesions or masses, perineum is normal on inspection. Prostate is smooth and flat. Neurologic: Grossly intact, no focal deficits Psychiatric: Normal mood and affect  I have reviewed prior pt notes  I have reviewed notes from referring/previous physicians  I have reviewed urinalysis results  I have reviewed prior PSA/pathology results  I have reviewed prior urine culture  Impression/Assessment:  Microscopic Hematuria - UA reviewed and RBC presence noted.  PCa--s/p EBRT--doing well.  Plan:  1. PSA drawn today.  2. Pt will call if he notices gross hematuria prior to his next visit.  3. F/U in 4 months for OV, PSA, and symptom recheck.  CC: Dr. Redmond School

## 2021-01-18 LAB — PSA: Prostate Specific Ag, Serum: 0.9 ng/mL (ref 0.0–4.0)

## 2021-01-20 ENCOUNTER — Telehealth: Payer: Self-pay

## 2021-01-20 NOTE — Telephone Encounter (Signed)
-----   Message from Franchot Gallo, MD sent at 01/19/2021  6:43 PM EST ----- Notify pt that psa 0.9--stable ----- Message ----- From: Iris Pert, LPN Sent: 9/93/5701  12:22 PM EST To: Franchot Gallo, MD  Please review

## 2021-01-20 NOTE — Telephone Encounter (Signed)
Patient called and made aware.

## 2021-01-24 ENCOUNTER — Ambulatory Visit: Payer: PPO | Admitting: Urology

## 2021-03-01 DIAGNOSIS — Z191 Hormone sensitive malignancy status: Secondary | ICD-10-CM | POA: Diagnosis not present

## 2021-03-01 DIAGNOSIS — I2510 Atherosclerotic heart disease of native coronary artery without angina pectoris: Secondary | ICD-10-CM | POA: Diagnosis not present

## 2021-03-01 DIAGNOSIS — C61 Malignant neoplasm of prostate: Secondary | ICD-10-CM | POA: Diagnosis not present

## 2021-03-01 DIAGNOSIS — E1122 Type 2 diabetes mellitus with diabetic chronic kidney disease: Secondary | ICD-10-CM | POA: Diagnosis not present

## 2021-03-01 DIAGNOSIS — N1831 Chronic kidney disease, stage 3a: Secondary | ICD-10-CM | POA: Diagnosis not present

## 2021-03-01 DIAGNOSIS — I129 Hypertensive chronic kidney disease with stage 1 through stage 4 chronic kidney disease, or unspecified chronic kidney disease: Secondary | ICD-10-CM | POA: Diagnosis not present

## 2021-03-21 DIAGNOSIS — B351 Tinea unguium: Secondary | ICD-10-CM | POA: Diagnosis not present

## 2021-03-21 DIAGNOSIS — E1142 Type 2 diabetes mellitus with diabetic polyneuropathy: Secondary | ICD-10-CM | POA: Diagnosis not present

## 2021-03-22 ENCOUNTER — Other Ambulatory Visit: Payer: Self-pay | Admitting: "Endocrinology

## 2021-03-28 DIAGNOSIS — E663 Overweight: Secondary | ICD-10-CM | POA: Diagnosis not present

## 2021-03-28 DIAGNOSIS — Z1389 Encounter for screening for other disorder: Secondary | ICD-10-CM | POA: Diagnosis not present

## 2021-03-28 DIAGNOSIS — Z0001 Encounter for general adult medical examination with abnormal findings: Secondary | ICD-10-CM | POA: Diagnosis not present

## 2021-03-28 DIAGNOSIS — Z6826 Body mass index (BMI) 26.0-26.9, adult: Secondary | ICD-10-CM | POA: Diagnosis not present

## 2021-03-28 DIAGNOSIS — D649 Anemia, unspecified: Secondary | ICD-10-CM | POA: Diagnosis not present

## 2021-03-28 LAB — HEPATIC FUNCTION PANEL
ALT: 14 (ref 10–40)
AST: 14 (ref 14–40)
Alkaline Phosphatase: 151 — AB (ref 25–125)
Bilirubin, Total: 0.5

## 2021-03-28 LAB — BASIC METABOLIC PANEL
BUN: 43 — AB (ref 4–21)
CO2: 20 (ref 13–22)
Chloride: 101 (ref 99–108)
Creatinine: 1.8 — AB (ref 0.6–1.3)
Glucose: 87
Potassium: 5.2 (ref 3.4–5.3)
Sodium: 138 (ref 137–147)

## 2021-03-28 LAB — COMPREHENSIVE METABOLIC PANEL
Albumin: 3.6 (ref 3.5–5.0)
Calcium: 8.7 (ref 8.7–10.7)

## 2021-04-01 DIAGNOSIS — C61 Malignant neoplasm of prostate: Secondary | ICD-10-CM | POA: Diagnosis not present

## 2021-04-01 DIAGNOSIS — E7849 Other hyperlipidemia: Secondary | ICD-10-CM | POA: Diagnosis not present

## 2021-04-01 DIAGNOSIS — I2510 Atherosclerotic heart disease of native coronary artery without angina pectoris: Secondary | ICD-10-CM | POA: Diagnosis not present

## 2021-04-01 DIAGNOSIS — E1122 Type 2 diabetes mellitus with diabetic chronic kidney disease: Secondary | ICD-10-CM | POA: Diagnosis not present

## 2021-04-01 DIAGNOSIS — N1831 Chronic kidney disease, stage 3a: Secondary | ICD-10-CM | POA: Diagnosis not present

## 2021-04-01 DIAGNOSIS — I129 Hypertensive chronic kidney disease with stage 1 through stage 4 chronic kidney disease, or unspecified chronic kidney disease: Secondary | ICD-10-CM | POA: Diagnosis not present

## 2021-04-30 ENCOUNTER — Other Ambulatory Visit: Payer: Self-pay

## 2021-04-30 ENCOUNTER — Encounter (HOSPITAL_COMMUNITY): Payer: Self-pay | Admitting: *Deleted

## 2021-04-30 ENCOUNTER — Emergency Department (HOSPITAL_COMMUNITY): Payer: PPO

## 2021-04-30 ENCOUNTER — Emergency Department (HOSPITAL_COMMUNITY)
Admission: EM | Admit: 2021-04-30 | Discharge: 2021-04-30 | Disposition: A | Discharge status: Home / Self Care | Payer: PPO | Attending: Emergency Medicine | Admitting: Emergency Medicine

## 2021-04-30 DIAGNOSIS — Z20822 Contact with and (suspected) exposure to covid-19: Secondary | ICD-10-CM | POA: Insufficient documentation

## 2021-04-30 DIAGNOSIS — I1310 Hypertensive heart and chronic kidney disease without heart failure, with stage 1 through stage 4 chronic kidney disease, or unspecified chronic kidney disease: Secondary | ICD-10-CM | POA: Diagnosis not present

## 2021-04-30 DIAGNOSIS — N261 Atrophy of kidney (terminal): Secondary | ICD-10-CM | POA: Diagnosis not present

## 2021-04-30 DIAGNOSIS — R918 Other nonspecific abnormal finding of lung field: Secondary | ICD-10-CM

## 2021-04-30 DIAGNOSIS — Z87891 Personal history of nicotine dependence: Secondary | ICD-10-CM | POA: Insufficient documentation

## 2021-04-30 DIAGNOSIS — R0789 Other chest pain: Secondary | ICD-10-CM | POA: Diagnosis not present

## 2021-04-30 DIAGNOSIS — Z85828 Personal history of other malignant neoplasm of skin: Secondary | ICD-10-CM | POA: Diagnosis not present

## 2021-04-30 DIAGNOSIS — N390 Urinary tract infection, site not specified: Secondary | ICD-10-CM | POA: Diagnosis not present

## 2021-04-30 DIAGNOSIS — K4020 Bilateral inguinal hernia, without obstruction or gangrene, not specified as recurrent: Secondary | ICD-10-CM | POA: Diagnosis not present

## 2021-04-30 DIAGNOSIS — I2510 Atherosclerotic heart disease of native coronary artery without angina pectoris: Secondary | ICD-10-CM | POA: Insufficient documentation

## 2021-04-30 DIAGNOSIS — R531 Weakness: Secondary | ICD-10-CM | POA: Insufficient documentation

## 2021-04-30 DIAGNOSIS — Z79899 Other long term (current) drug therapy: Secondary | ICD-10-CM | POA: Diagnosis not present

## 2021-04-30 DIAGNOSIS — Z794 Long term (current) use of insulin: Secondary | ICD-10-CM | POA: Diagnosis not present

## 2021-04-30 DIAGNOSIS — R319 Hematuria, unspecified: Secondary | ICD-10-CM

## 2021-04-30 DIAGNOSIS — R1031 Right lower quadrant pain: Secondary | ICD-10-CM | POA: Diagnosis not present

## 2021-04-30 DIAGNOSIS — N183 Chronic kidney disease, stage 3 unspecified: Secondary | ICD-10-CM | POA: Insufficient documentation

## 2021-04-30 DIAGNOSIS — K8050 Calculus of bile duct without cholangitis or cholecystitis without obstruction: Secondary | ICD-10-CM | POA: Diagnosis not present

## 2021-04-30 DIAGNOSIS — E1122 Type 2 diabetes mellitus with diabetic chronic kidney disease: Secondary | ICD-10-CM | POA: Diagnosis not present

## 2021-04-30 DIAGNOSIS — Z951 Presence of aortocoronary bypass graft: Secondary | ICD-10-CM | POA: Insufficient documentation

## 2021-04-30 DIAGNOSIS — R079 Chest pain, unspecified: Secondary | ICD-10-CM | POA: Diagnosis not present

## 2021-04-30 DIAGNOSIS — N1330 Unspecified hydronephrosis: Secondary | ICD-10-CM | POA: Diagnosis not present

## 2021-04-30 DIAGNOSIS — R911 Solitary pulmonary nodule: Secondary | ICD-10-CM | POA: Diagnosis not present

## 2021-04-30 DIAGNOSIS — Z7982 Long term (current) use of aspirin: Secondary | ICD-10-CM | POA: Diagnosis not present

## 2021-04-30 DIAGNOSIS — Z8546 Personal history of malignant neoplasm of prostate: Secondary | ICD-10-CM | POA: Diagnosis not present

## 2021-04-30 LAB — POC OCCULT BLOOD, ED: Stool Culture result 1 (CMPCXR): NEGATIVE

## 2021-04-30 LAB — URINALYSIS, ROUTINE W REFLEX MICROSCOPIC
Bacteria, UA: NONE SEEN
Bilirubin Urine: NEGATIVE
Glucose, UA: NEGATIVE mg/dL
Ketones, ur: NEGATIVE mg/dL
Leukocytes,Ua: NEGATIVE
Nitrite: NEGATIVE
Protein, ur: 100 mg/dL — AB
RBC / HPF: >50 RBC/hpf — ABNORMAL HIGH (ref 0–5)
Specific Gravity, Urine: 1.012 (ref 1.005–1.030)
WBC, UA: >50 WBC/hpf — ABNORMAL HIGH (ref 0–5)
pH: 6 (ref 5.0–8.0)

## 2021-04-30 LAB — RESP PANEL BY RT-PCR (FLU A&B, COVID) ARPGX2
Influenza A by PCR: NEGATIVE
Influenza B by PCR: NEGATIVE
SARS Coronavirus 2 by RT PCR: NEGATIVE

## 2021-04-30 LAB — MAGNESIUM: Magnesium: 2 mg/dL (ref 1.7–2.4)

## 2021-04-30 LAB — COMPREHENSIVE METABOLIC PANEL
ALT: 26 U/L (ref 0–44)
AST: 24 U/L (ref 15–41)
Albumin: 2.8 g/dL — ABNORMAL LOW (ref 3.5–5.0)
Alkaline Phosphatase: 123 U/L (ref 38–126)
Anion gap: 4 — ABNORMAL LOW (ref 5–15)
BUN: 33 mg/dL — ABNORMAL HIGH (ref 8–23)
CO2: 27 mmol/L (ref 22–32)
Calcium: 7.9 mg/dL — ABNORMAL LOW (ref 8.9–10.3)
Chloride: 101 mmol/L (ref 98–111)
Creatinine, Ser: 1.81 mg/dL — ABNORMAL HIGH (ref 0.61–1.24)
GFR, Estimated: 36 mL/min — ABNORMAL LOW (ref >60)
Glucose, Bld: 139 mg/dL — ABNORMAL HIGH (ref 70–99)
Potassium: 4.8 mmol/L (ref 3.5–5.1)
Sodium: 132 mmol/L — ABNORMAL LOW (ref 135–145)
Total Bilirubin: 0.7 mg/dL (ref 0.3–1.2)
Total Protein: 6.6 g/dL (ref 6.5–8.1)

## 2021-04-30 LAB — AMMONIA: Ammonia: <9 umol/L — ABNORMAL LOW (ref 9–35)

## 2021-04-30 LAB — CBC WITH DIFFERENTIAL/PLATELET
Abs Immature Granulocytes: 0.01 10*3/uL (ref 0.00–0.07)
Basophils Absolute: 0 10*3/uL (ref 0.0–0.1)
Basophils Relative: 0 %
Eosinophils Absolute: 0.1 10*3/uL (ref 0.0–0.5)
Eosinophils Relative: 2 %
HCT: 24.9 % — ABNORMAL LOW (ref 39.0–52.0)
Hemoglobin: 7.9 g/dL — ABNORMAL LOW (ref 13.0–17.0)
Immature Granulocytes: 0 %
Lymphocytes Relative: 12 %
Lymphs Abs: 0.9 10*3/uL (ref 0.7–4.0)
MCH: 29.2 pg (ref 26.0–34.0)
MCHC: 31.7 g/dL (ref 30.0–36.0)
MCV: 91.9 fL (ref 80.0–100.0)
Monocytes Absolute: 0.5 10*3/uL (ref 0.1–1.0)
Monocytes Relative: 7 %
Neutro Abs: 6 10*3/uL (ref 1.7–7.7)
Neutrophils Relative %: 79 %
Platelets: 252 10*3/uL (ref 150–400)
RBC: 2.71 MIL/uL — ABNORMAL LOW (ref 4.22–5.81)
RDW: 14.3 % (ref 11.5–15.5)
WBC: 7.6 10*3/uL (ref 4.0–10.5)
nRBC: 0 % (ref 0.0–0.2)

## 2021-04-30 LAB — TROPONIN I (HIGH SENSITIVITY)
Troponin I (High Sensitivity): 8 ng/L (ref <18)
Troponin I (High Sensitivity): 8 ng/L (ref <18)

## 2021-04-30 LAB — LIPASE, BLOOD: Lipase: 25 U/L (ref 11–51)

## 2021-04-30 MED ORDER — CEPHALEXIN 500 MG PO CAPS: 500.0000 mg | ORAL_CAPSULE | Freq: Three times a day (TID) | ORAL | 0 refills | Status: DC

## 2021-04-30 MED ORDER — SODIUM CHLORIDE 0.9 % IV SOLN
1.0000 g | Freq: Once | INTRAVENOUS | Status: AC
  Administered 2021-04-30: 1 g via INTRAVENOUS
  Filled 2021-04-30: qty 10

## 2021-04-30 MED ORDER — ONDANSETRON HCL 4 MG/2ML IJ SOLN
4.0000 mg | Freq: Once | INTRAMUSCULAR | Status: AC
  Administered 2021-04-30: 4 mg via INTRAVENOUS
  Filled 2021-04-30: qty 2

## 2021-04-30 MED ORDER — FENTANYL CITRATE (PF) 100 MCG/2ML IJ SOLN
50.0000 ug | Freq: Once | INTRAMUSCULAR | Status: AC
  Administered 2021-04-30: 50 ug via INTRAVENOUS
  Filled 2021-04-30: qty 2

## 2021-04-30 NOTE — ED Triage Notes (Signed)
Pain in right side of chest onset today

## 2021-04-30 NOTE — Discharge Instructions (Addendum)
You were seen today for a few things.   1) You were diagnosed with an urinary tract infection. We gave you a dose of antibiotics in the ED. You will need to take Keflex 3x daily for the next 7 days.   2) We found pulmonary nodules on the chest xray. Although we do not know yet what this means, it is imperative that you follow up with oncology for further evaluation. Please call them to set up an appointment for next week. They will perform the next steps in your workup.  3) The CT of your abdomen showed a gallstone in your common bile duct. It is unclear if this is the cause of the abdominal pain or if the pain is due to the urinary tract infection. Ideally, the pain will resolve with antibiotics over the next week. However, you will need to be seen by Gastroenterology for further evaluation of the stone. Please call Tuesday morning for the next available appointment.   If your symptoms worsen return the ED for further evaluation.

## 2021-04-30 NOTE — ED Notes (Signed)
Pt returned from xray

## 2021-04-30 NOTE — ED Provider Notes (Signed)
[facility] EMERGENCY DEPARTMENT Provider Note   CSN: [id number] Arrival date & time: 04/30/21  1653     History Chief Complaint  Patient presents with  . Chest Pain    Grant [name] is a 87 y.o. male.  HPI  HPI: A 87 year old patient with a history of treated diabetes and hypertension presents for evaluation of chest pain. Initial onset of pain was approximately 1-3 hours ago. The patient's chest pain is not worse with exertion. The patient's chest pain is not middle- or left-sided, is not well-localized, is not described as heaviness/pressure/tightness, is not sharp and does not radiate to the arms/jaw/neck. The patient does not complain of nausea and denies diaphoresis. The patient has no history of stroke, has no history of peripheral artery disease, has not smoked in the past 90 days, has no relevant family history of coronary artery disease (first degree relative at less than age 83), has no history of hypercholesterolemia and does not have an elevated BMI (>=30). Patient presents with right sided chest pain, right sided flank pain, and general weakness. The right flank pain pain stated hours ago this morning acutely and has been constant. It does not radiate. Reports dysuria and hematuria for the past few days. He has prostate cancer but states this is new. No fevers, nausea, vomiting, diarrhea, blood in stool, SOB, back pain, leg swelling.  Reports he has been generally declining the past year. He reports feeling generalized weakness. His wife reports he has been overly somnolent the last two weeks. He is sleeping more than normal. She associated his mental decline with when his PCP doubled his aricept dosage.   He has history of multiple vessel bypass surgery and stent placement. Not on blood thinner. Has been having signs of dementia since October. Also has a history of pancreatic and colon cancer. He is diabetic on insulin.   Past Medical History:  Diagnosis Date  . Arthritis    . CAD (coronary artery disease)   . Diabetes mellitus   . Erectile dysfunction   . GERD (gastroesophageal reflux disease)   . Gout 09-09-13   last flare 8'14 with gallbladder attack at [facility]  . Hyperlipidemia   . Hypertension   . Macular degeneration 09-09-13   bilateral"vision is poor"  . Pancreatitis 10--8-14   8'14 -enzymes improved  . Prostate cancer ([place])   . Psoriasis 09-09-13   elbows, knees  . RBBB   . Skin cancer    forehead  . Tubular adenoma     Patient Active Problem List   Diagnosis Date Noted  . Malignant neoplasm of prostate ([place]) 11/17/2019  . Mixed hyperlipidemia 10/23/2017  . Type 2 diabetes mellitus with stage 3 chronic kidney disease, with long-term current use of insulin ([place]) 04/22/2017  . Trifascicular block 08/02/2016  . Essential hypertension, benign 06/28/2016  . Loss of weight 06/20/2016  . Helicobacter pylori gastritis 06/20/2016  . Mucosal abnormality of stomach   . History of colonic polyps   . History of adenomatous polyp of colon 01/27/2015  . Anemia 01/27/2015  . Inguinal hernia, bilateral-containing fat 09/03/2013  . Gallstones 09/03/2013  . Hyperlipidemia 08/03/2013  . CAD (coronary artery disease) 07/31/2013  . Pancreatitis 07/31/2013  . Hyperkalemia 07/31/2013  . Type 2 diabetes mellitus with vascular disease ([place]) 07/31/2013  . Elevated liver enzymes 07/31/2013    Past Surgical History:  Procedure Laterality Date  . CARDIAC CATHETERIZATION  02/19/2011   3 vessel CAD  . CATARACT EXTRACTION W/PHACO Left 02/14/2016  Procedure: CATARACT EXTRACTION PHACO AND INTRAOCULAR LENS PLACEMENT (IOC);  Surgeon: Rutherford Guys, MD;  Location: [facility];  Service: Ophthalmology;  Laterality: Left;  CDE:6.96  . CATARACT EXTRACTION W/PHACO Right 02/28/2016   Procedure: CATARACT EXTRACTION PHACO AND INTRAOCULAR LENS PLACEMENT (IOC);  Surgeon: Rutherford Guys, MD;  Location: [facility];  Service: Ophthalmology;  Laterality: Right;  CDE:9.54  . CHOLECYSTECTOMY N/A  10/27/2013   Procedure: LAPAROSCOPIC CHOLECYSTECTOMY WITH INTRAOPERATIVE CHOLANGIOGRAM WITH LIVER BIOPSY;  Surgeon: Pedro Earls, MD;  Location: [facility];  Service: General;  Laterality: N/A;  . COLONOSCOPY  02/25/2012   Procedure: COLONOSCOPY;  Surgeon: Daneil Dolin, MD;  Location: [facility] SU[facility];  Service: Endoscopy;  Laterality: N/A;  7:30 AM polyps removed.  . COLONOSCOPY N/A 02/11/2015   Dr.Rourk- multiple colonic polyps bx= tubular adenoma and an inflammatory polyp  . CORONARY ARTERY BYPASS GRAFT  02-28-2011   LIMA to LAD,SVG to acute marginal branch of RCA & sequential SVG to OM1 & OM2.  . ESOPHAGOGASTRODUODENOSCOPY N/A 05/10/2016   RMR: mild schatzki ring, medium sized hh, H.pylori gastritis  . NM MYOCAR PERF WALL MOTION  02/15/2011   High Risk  . PILONIDAL CYST EXCISION    . US ECHOCARDIOGRAPHY  11/09/2008   mildly impaired diastolic dysfunction - Grade I       Family History  Problem Relation Age of Onset  . Stroke Father   . Leukemia Mother   . Heart failure Brother   . Pancreatic cancer Sister   . Hypertension Sister   . Breast cancer Sister   . Heart failure Sister   . Anesthesia problems Neg Hx   . Hypotension Neg Hx   . Malignant hyperthermia Neg Hx   . Pseudochol deficiency Neg Hx   . Colon cancer Neg Hx   . Prostate cancer Neg Hx     Social History   Tobacco Use  . Smoking status: Former Smoker    Packs/day: 0.50    Years: 25.00    Pack years: 12.50    Types: Cigarettes    Quit date: 02/25/1983    Years since quitting: 38.2  . Smokeless tobacco: Never Used  Vaping Use  . Vaping Use: Never used  Substance Use Topics  . Alcohol use: Yes    Comment: occasional  . Drug use: No    Home Medications Prior to Admission medications   Medication Sig Start Date End Date Taking? Authorizing Provider  allopurinol (ZYLOPRIM) 100 MG tablet Take 100 mg by mouth daily with breakfast.     [provider]  aspirin EC 81 MG tablet Take 1 tablet (81 mg  total) by mouth daily. 09/10/17   Croitoru, Mihai, MD  atorvastatin (LIPITOR) 40 MG tablet Take 40 mg by mouth every evening.     [provider]  betamethasone dipropionate (DIPROLENE) 0.05 % cream Apply 1 application topically daily as needed (siriasis).  11/22/15   [provider]  blood glucose meter kit and supplies KIT Dispense based on patient and insurance preference. Use up to 2 times daily as directed. (FOR ICD-10 E11.65) Talking blood glucose meter. 07/07/18   Cassandria Anger, MD  carvedilol (COREG) 12.5 MG tablet Take 12.5 mg by mouth 2 (two) times daily with a meal.     [provider]  donepezil (ARICEPT) 5 MG tablet Take 5 mg by mouth daily. 11/09/20   [provider]  EASYMAX TEST test strip USE TO TEST TWICE DAILY. 12/01/20   Cassandria Anger, MD  famotidine (PEPCID) 40 MG tablet Take 40 mg by mouth 2 (two) times daily. 04/28/20   [provider]  Insulin Pen Needle (PEN NEEDLES) 31G X 6 MM MISC 1 each by Does not apply route as directed. Use as directed for lantus injection daily at bedtime 03/30/20   Cassandria Anger, MD  LANTUS SOLOSTAR 100 UNIT/ML Solostar Pen ADMINISTER 20 UNITS UNDER THE SKIN AT BEDTIME 03/22/21   Cassandria Anger, MD  Misc Natural Products (TART CHERRY ADVANCED PO) Take 1,200 mg by mouth daily.     [provider]  Multiple Vitamin (MULTIVITAMIN) tablet Take 1 tablet by mouth daily.     [provider]  Multiple Vitamins-Minerals (PRESERVISION AREDS PO) Take 1 capsule by mouth 2 (two) times daily.     [provider]  quinapril (ACCUPRIL) 20 MG tablet Take 20 mg by mouth every morning.     [provider]    Allergies    Amiodarone  Review of Systems   Review of Systems  Constitutional: Negative for chills, fatigue and fever.  HENT: Negative for ear pain and sore throat.   Eyes: Positive for visual disturbance. Negative for pain.       Macular degeneration    Respiratory: Negative for cough and shortness of breath.   Cardiovascular: Positive for chest pain. Negative for palpitations and leg swelling.  Gastrointestinal: Positive for abdominal pain. Negative for diarrhea, nausea and vomiting.  Genitourinary: Positive for dysuria, flank pain and hematuria.  Musculoskeletal: Negative for arthralgias and back pain.  Skin: Negative for color change and rash.  Neurological: Positive for weakness. Negative for seizures, syncope, facial asymmetry, numbness and headaches.  Psychiatric/Behavioral: Positive for confusion.  All other systems reviewed and are negative.   Physical Exam Updated Vital Signs BP (!) 163/64   Pulse 72   Temp 98.5 F (36.9 C)   Resp (!) 23   SpO2 99%   Physical Exam Vitals and nursing note reviewed. Exam conducted with a chaperone present.  Constitutional:      General: He is not in acute distress.    Appearance: Normal appearance. He is ill-appearing.     Comments: Chronically ill appearing   HENT:     Head: Normocephalic and atraumatic.  Eyes:     General: No scleral icterus.       Right eye: No discharge.        Left eye: No discharge.     Extraocular Movements: Extraocular movements intact.     Pupils: Pupils are equal, round, and reactive to light.  Cardiovascular:     Rate and Rhythm: Normal rate and regular rhythm.     Pulses: Normal pulses.     Heart sounds: Normal heart sounds. No murmur heard. No friction rub. No gallop.      Comments: Surgical scar from bypass surgery. No chest tenderness  Pulmonary:     Effort: Pulmonary effort is normal. No respiratory distress.     Breath sounds: Normal breath sounds.  Abdominal:     General: Abdomen is flat. Bowel sounds are normal. There is no distension.     Palpations: Abdomen is soft.     Tenderness: There is abdominal tenderness. There is no right CVA tenderness or left CVA tenderness.     Comments: Right lower quadrant tenderness. Right flank tenderness.  No CVAT. Intermittent complaint of pain without guarding or peritoneal signs.   Musculoskeletal:        General: Normal range of motion.  Skin:  General: Skin is warm and dry.     Coloration: Skin is not jaundiced.  Neurological:     General: No focal deficit present.     Mental Status: He is alert.     Coordination: Coordination normal.  Psychiatric:        Attention and Perception: Attention normal.        Mood and Affect: Mood normal.        Speech: Speech normal.        Behavior: Behavior is uncooperative.     ED Results / Procedures / Treatments   Labs (all labs ordered are listed, but only abnormal results are displayed) Labs Reviewed  CBC WITH DIFFERENTIAL/PLATELET - Abnormal; Notable for the following components:      Result Value   RBC 2.71 (*)    Hemoglobin 7.9 (*)    HCT 24.9 (*)    All other components within normal limits  COMPREHENSIVE METABOLIC PANEL - Abnormal; Notable for the following components:   Sodium 132 (*)    Glucose, Bld 139 (*)    BUN 33 (*)    Creatinine, Ser 1.81 (*)    Calcium 7.9 (*)    Albumin 2.8 (*)    GFR, Estimated 36 (*)    Anion gap 4 (*)    All other components within normal limits  AMMONIA - Abnormal; Notable for the following components:   Ammonia <9 (*)    All other components within normal limits  RESP PANEL BY RT-PCR (FLU A&B, COVID) ARPGX2  URINE CULTURE  LIPASE, BLOOD  MAGNESIUM  URINALYSIS, ROUTINE W REFLEX MICROSCOPIC  POC OCCULT BLOOD, ED  CBG MONITORING, ED  TROPONIN I (HIGH SENSITIVITY)    EKG EKG Interpretation  Date/Time:  Sunday Apr 30 2021 17:11:41 EDT Ventricular Rate:  70 PR Interval:  234 QRS Duration: 139 QT Interval:  424 QTC Calculation: 458 R Axis:   -60 Text Interpretation: Sinus rhythm Atrial premature complex Prolonged PR interval RBBB and LAFB Since last tracing QRS iselongated Confirmed by Noemi Chapel [phone]) on 04/30/2021 6:32:25 PM   Radiology CT ABDOMEN PELVIS WO  CONTRAST  Result Date: 04/30/2021 CLINICAL DATA:  87 year old male with flank pain. Concern for kidney stone. History of prostate cancer. EXAM: CT ABDOMEN AND PELVIS WITHOUT CONTRAST TECHNIQUE: Multidetector CT imaging of the abdomen and pelvis was performed following the standard protocol without IV contrast. COMPARISON:  CT abdomen pelvis dated 09/09/2019. FINDINGS: Evaluation of this exam is limited in the absence of intravenous contrast. Lower chest: Multiple bilateral pulmonary nodules measuring up to 18 mm in the right lower lobe, and new since the prior CT, consistent with metastatic disease. No intra-abdominal free air or free fluid. Hepatobiliary: The liver is unremarkable. No intrahepatic biliary dilatation. Cholecystectomy. There is a 5 mm stone in the central CBD at the head of the pancreas. The common bile duct measures up to 9 mm in diameter. Pancreas: Unremarkable. No pancreatic ductal dilatation or surrounding inflammatory changes. Spleen: Normal in size without focal abnormality. Adrenals/Urinary Tract: The adrenal glands are unremarkable. There is mild left renal parenchyma atrophy and cortical scarring and irregularity. There is mild right hydronephrosis. No stone identified in the right ureter or in the right kidney. There is however high attenuating content within the right renal pelvis which may represent blood product or a urothelial neoplasm. Dedicated multiphasic CT urography or direct visualization with scope is recommended. The urinary bladder is grossly unremarkable. Stomach/Bowel: There is no bowel obstruction or active inflammation. The appendix  is normal. Vascular/Lymphatic: Advanced aortoiliac atherosclerotic disease. The IVC is unremarkable. No portal venous gas. Retroperitoneal adenopathy measuring approximately 2 cm in short axis at the level of the left renal vasculature. Reproductive: Prostate calcification and fiducial markers noted. Small nonobstructing bilateral fat  containing inguinal hernia. Other: None Musculoskeletal: Osteopenia with degenerative changes of the spine. No acute osseous pathology. No suspicious bone lesions. IMPRESSION: 1. Multiple bilateral pulmonary nodules consistent with metastatic disease. 2. Mild right hydronephrosis. No stone identified in the right ureter or in the right kidney. High attenuating content within the right renal pelvis may represent blood product/clot or a urothelial neoplasm. Urology consult and further evaluation with dedicated multiphasic CT urography or direct visualization with scope is recommended. 3. Retroperitoneal adenopathy. 4. A 5 mm stone in the central CBD. 5. No bowel obstruction. Normal appendix. 6. Aortic Atherosclerosis (ICD10-I70.0). Electronically Signed   By: Anner Crete M.D.   On: 04/30/2021 18:16   DG Chest Portable 1 View  Result Date: 04/30/2021 CLINICAL DATA:  Chest pain. EXAM: PORTABLE CHEST 1 VIEW COMPARISON:  September 09, 2013 FINDINGS: Postsurgical changes from CABG. Cardiomediastinal silhouette is normal. Mediastinal contours appear intact. There is no evidence of focal airspace consolidation, pleural effusion or pneumothorax. There are however multiple hyperdense pulmonary nodules in both lungs. Osseous structures are without acute abnormality. Soft tissues are grossly normal. IMPRESSION: Multiple hyperdense pulmonary nodules in both lungs. Further evaluation with chest CT may be considered, as these are suspicious for metastatic disease. Electronically Signed   By: Fidela Salisbury M.D.   On: 04/30/2021 17:53    Procedures Procedures   Medications Ordered in ED Medications  ondansetron (ZOFRAN) injection 4 mg (4 mg Intravenous Given 04/30/21 1744)  fentaNYL (SUBLIMAZE) injection 50 mcg (50 mcg Intravenous Given 04/30/21 1744)    ED Course  I have reviewed the triage vital signs and the nursing notes.  Pertinent labs & imaging results that were available during my care of the patient  were reviewed by me and considered in my medical decision making (see chart for details).  Clinical Course as of 04/30/21 Jorene Minors Apr 30, 2021  1842 Creatinine(!): 1.81 Cr is at baseline [HS]  1843 BUN(!): 33 At baseline [HS]  1847 Lipase: 25 Not consistent with pancreatis. Combined with CT findings not pancreatitis  [HS]    Clinical Course User Index [HS] Sherrill Raring, PA-C   MDM Rules/Calculators/A&P HEAR Score: 4                        Patient presents for multiple complaints. His PMH is concerning for colon cancer, CAD requiring multiple vessel bypass, DMT2, and mental decline/ possible dementia.   Given his serious medical historyand broadness of his complaint will initiate broad workup. Ddx includes but is not limited to: kidney stone, UTI, renal obstruction, pyelo, ACS, encephalopathy, pancreatitis. CT abd/pevlis ordered to assess for possible kidney stone, renal obstruction, etc.   CBC shows low Hgb and RBC. Unable to compare to any recent labs on epic or careeverywhere. Per chart review, he has a history of anemia. However, given unable to compare to a more recent finding will hemocult patient. He is hemocult negative. Per chart review he had a colonoscopy done in 2016 to evaluate for anemia. They found colon polyps but did not believe that to be the cause. In 2017 an upper endoscopy was done for GERD and weight loss which showed gastric erosions.   Hemoglobin  Date Value Ref Range  Status  04/30/2021 7.9 (L) 13.0 - 17.0 g/dL Final  05/04/2016 10.7 (L) 13.2 - 17.1 g/dL Final  02/07/2016 10.5 (L) 13.0 - 17.0 g/dL Final  01/31/2015 11.4 (L) 13.0 - 17.0 g/dL Final   CT abdomen shows a 45m stone in the CBD near pancreas causing CBD dilation to 9 mm. He is status post cholecystectomy 2014. This stone is likely the source of his right sided abdominal pain.   Unfortunately, both CT and CXR show pulmonary nodules concerning for metastatic disease.   At this point, I don't suspect  ACS or emergent cardiac disease. It is unclear if the abdominal pain is due to the UTI or the gallstone. GI was consulted and will see patient outpatient for further workup. Referrals made for oncology for further evaluation of nodule. Antibiotics prescribed for the UTI.  Return precautions given. Patient and wife voice understanding for the plan.  Discussed HPI, physical exam and plan of care for this patient with attending BNoemi Chapel The attending physician evaluated this patient as part of a shared visit and agrees with plan of care.  Final Clinical Impression(s) / ED Diagnoses Final diagnoses:  None    Rx / DC Orders ED Discharge Orders    None      SSherrill Raring PHershal Coria06/01/22 1341  MNoemi Chapel MD 05/05/21 [phone]

## 2021-04-30 NOTE — ED Provider Notes (Signed)
Has had CABG and and prostate cancers status post radiation treatments about 8 months ago, in the past - having R flank pain - with dysuria and hematuria - some R lower abd pain as well - has been going on for a couple of days - pt has some possible demential (MS changes since 10/21 after fall).  The dose of the Aricept was doubled about a month ago, he has been excessively tired for the last 2 weeks, he does not appear tired at this time  Level 5 caveat applies due to cognitive decline  On exam has no distress, has ttp in the R flank / and abd on RLQ - non peritoneal.  No guarding.  Normal level of alertness able to follow commands, had some difficulty with his memory from time to time throughout the exam  Need to rule out kidney stone or other signs of urinary pathology, especially given history of prostate cancer.  I discussed the case with Dr. Gala Romney who recommends that the patient will need an outpatient ERCP unless symptoms worsen with jaundice pain or vomiting.  With the alternative diagnosis of UTI we will treat with antibiotics and refer to Dr. Melony Overly at the request of Dr. Gala Romney.  The patient is aware that he can come back if things get worse.  The spouse is also aware that there is signs of possible metastatic disease.  In basket messages were sent to both oncology and the patient's urologist Dr. Diona Fanti regarding possible metastatic disease to lungs.  I could not see any evidence of the patient seeing a medical oncologist in the past just the radiation oncologist.  Dr. Delton Coombes was included on this messaging  Pt appears stable for d/c.  Medical screening examination/treatment/procedure(s) were conducted as a shared visit with non-physician practitioner(s) and myself.  I personally evaluated the patient during the encounter.  Clinical Impression:   Final diagnoses:  Urinary tract infection with hematuria, site unspecified  Choledocholithiasis  Pulmonary nodules         Noemi Chapel, MD 04/30/21 2004

## 2021-05-01 ENCOUNTER — Telehealth (HOSPITAL_COMMUNITY): Payer: Self-pay | Admitting: Emergency Medicine

## 2021-05-01 MED ORDER — CIPROFLOXACIN HCL 500 MG PO TABS: 500.0000 mg | ORAL_TABLET | Freq: Two times a day (BID) | ORAL | 0 refills | Status: DC

## 2021-05-01 NOTE — Telephone Encounter (Signed)
Patient called the nurse here stating that the Keflex antibiotics is causing nausea and vomiting.  Asking for something else to be called into the pharmacy.  Have called in a prescription for Cipro 500 mg twice daily x5 days.  They are to stop the Keflex.

## 2021-05-02 ENCOUNTER — Ambulatory Visit: Payer: PPO | Admitting: "Endocrinology

## 2021-05-02 DIAGNOSIS — E7849 Other hyperlipidemia: Secondary | ICD-10-CM | POA: Diagnosis not present

## 2021-05-02 DIAGNOSIS — I129 Hypertensive chronic kidney disease with stage 1 through stage 4 chronic kidney disease, or unspecified chronic kidney disease: Secondary | ICD-10-CM | POA: Diagnosis not present

## 2021-05-02 DIAGNOSIS — N1831 Chronic kidney disease, stage 3a: Secondary | ICD-10-CM | POA: Diagnosis not present

## 2021-05-02 DIAGNOSIS — E1122 Type 2 diabetes mellitus with diabetic chronic kidney disease: Secondary | ICD-10-CM | POA: Diagnosis not present

## 2021-05-02 LAB — URINE CULTURE: Culture: <10000 — AB

## 2021-05-10 DIAGNOSIS — E1129 Type 2 diabetes mellitus with other diabetic kidney complication: Secondary | ICD-10-CM | POA: Diagnosis not present

## 2021-05-10 DIAGNOSIS — Z79899 Other long term (current) drug therapy: Secondary | ICD-10-CM | POA: Diagnosis not present

## 2021-05-10 DIAGNOSIS — R809 Proteinuria, unspecified: Secondary | ICD-10-CM | POA: Diagnosis not present

## 2021-05-10 DIAGNOSIS — I129 Hypertensive chronic kidney disease with stage 1 through stage 4 chronic kidney disease, or unspecified chronic kidney disease: Secondary | ICD-10-CM | POA: Diagnosis not present

## 2021-05-10 DIAGNOSIS — N189 Chronic kidney disease, unspecified: Secondary | ICD-10-CM | POA: Diagnosis not present

## 2021-05-10 DIAGNOSIS — D638 Anemia in other chronic diseases classified elsewhere: Secondary | ICD-10-CM | POA: Diagnosis not present

## 2021-05-10 DIAGNOSIS — N200 Calculus of kidney: Secondary | ICD-10-CM | POA: Diagnosis not present

## 2021-05-10 DIAGNOSIS — R319 Hematuria, unspecified: Secondary | ICD-10-CM | POA: Diagnosis not present

## 2021-05-10 DIAGNOSIS — E1122 Type 2 diabetes mellitus with diabetic chronic kidney disease: Secondary | ICD-10-CM | POA: Diagnosis not present

## 2021-05-11 ENCOUNTER — Telehealth: Payer: Self-pay | Admitting: "Endocrinology

## 2021-05-11 NOTE — Progress Notes (Signed)
[facility] [address], [place] [address]   CLINIC:  Medical Oncology/Hematology  CONSULT NOTE  Patient Care Team: Redmond School, MD as PCP - General (Internal Medicine) Croitoru, Dani Gobble, MD as PCP - Cardiology (Cardiology) Merita Norton, MD as Referring Physician (Specialist) Cassandria Anger, MD as Consulting Physician (Endocrinology) Gala Romney Cristopher Estimable, MD as Consulting Physician (Gastroenterology) Cira Rue, RN Nurse Navigator as Registered Nurse (Medical Oncology) Franchot Gallo, MD as Consulting Physician (Urology) Brien Mates, RN as Oncology Nurse Navigator (Oncology)  CHIEF COMPLAINTS/PURPOSE OF CONSULTATION:  Evaluation of history of prostate cancer & new pulmonary nodules  HISTORY OF PRESENTING ILLNESS:  Grant [name] 87 y.o. male is here because of an evaluation of history of prostate cancer & new pulmonary nodules, at the request of Dr. Noemi Chapel.  Today he reports feeling okay. Today he is accompanied by his wife. He has a history of prostate cancer beginning 1 year ago. He is still being seen by his urologist, and his next appointment is on 05/30/2021. He reported to the ED on 04/30/2021 for blood in his urine. He does not have any blood in his urin at this time. He has lost approximately 10 lbs in the past 6 months. He was seen by Dr. Theador Hawthorne on 05/10/2021 for a potential kidney growth and is scheduled to see him again on 06/09/2021. He denies any fever or night sweats. He had a fall last August and hit his head; he reported to urgent care and reportedly has had issues with confusion ever since. He denies any new developed pains. He reports normal levels of activity at home. He denies bloody or black stools.   He works in Pharmacist, hospital. He quite smoking in 1984 after smoking less than 1 ppd for 20 years. His mother had leukemia, and 2 sisters had breast cancer.   MEDICAL HISTORY:  Past Medical History:  Diagnosis  Date   Arthritis    CAD (coronary artery disease)    Diabetes mellitus    Erectile dysfunction    GERD (gastroesophageal reflux disease)    Gout 09-09-13   last flare 8'14 with gallbladder attack at [facility]   Hyperlipidemia    Hypertension    Macular degeneration 09-09-13   bilateral"vision is poor"   Pancreatitis 10--8-14   8'14 -enzymes improved   Prostate cancer (HCC)    Psoriasis 09-09-13   elbows, knees   RBBB    Skin cancer    forehead   Tubular adenoma     SURGICAL HISTORY: Past Surgical History:  Procedure Laterality Date   CARDIAC CATHETERIZATION  02/19/2011   3 vessel CAD   CATARACT EXTRACTION W/PHACO Left 02/14/2016   Procedure: CATARACT EXTRACTION PHACO AND INTRAOCULAR LENS PLACEMENT ([place]);  Surgeon: Rutherford Guys, MD;  Location: [facility];  Service: Ophthalmology;  Laterality: Left;  CDE:6.96   CATARACT EXTRACTION W/PHACO Right 02/28/2016   Procedure: CATARACT EXTRACTION PHACO AND INTRAOCULAR LENS PLACEMENT (I[facility]);  Surgeon: Rutherford Guys, MD;  Location: [facility];  Service: Ophthalmology;  Laterality: Right;  CDE:9.54   CHOLECYSTECTOMY N/A 10/27/2013   Procedure: LAPAROSCOPIC CHOLECYSTECTOMY WITH INTRAOPERATIVE CHOLANGIOGRAM WITH LIVER BIOPSY;  Surgeon: Pedro Earls, MD;  Location: [facility];  Service: General;  Laterality: N/A;   COLONOSCOPY  02/25/2012   Procedure: COLONOSCOPY;  Surgeon: Daneil Dolin, MD;  Location: [facility];  Service: Endoscopy;  Laterality: N/A;  7:30 AM polyps removed.   COLONOSCOPY N/A 02/11/2015   Dr.Rourk- multiple colonic polyps bx=  tubular adenoma and an inflammatory polyp   CORONARY ARTERY BYPASS GRAFT  02-28-2011   LIMA to LAD,SVG to acute marginal branch of RCA & sequential SVG to OM1 & OM2.   ESOPHAGOGASTRODUODENOSCOPY N/A 05/10/2016   RMR: mild schatzki ring, medium sized hh, H.pylori gastritis   NM MYOCAR PERF WALL MOTION  02/15/2011   High Risk   PILONIDAL CYST EXCISION     US ECHOCARDIOGRAPHY  11/09/2008   mildly impaired diastolic  dysfunction - Grade I    SOCIAL HISTORY: Social History   Socioeconomic History   Marital status: Married    Spouse name: Not on file   Number of children: 1   Years of education: Not on file   Highest education level: Not on file  Occupational History    Comment: retired  Tobacco Use   Smoking status: Former    Packs/day: 0.50    Years: 25.00    Pack years: 12.50    Types: Cigarettes    Quit date: 02/25/1983    Years since quitting: 38.2   Smokeless tobacco: Never  Vaping Use   Vaping Use: Never used  Substance and Sexual Activity   Alcohol use: Yes    Comment: occasional   Drug use: No   Sexual activity: Not Currently  Other Topics Concern   Not on file  Social History Narrative   Not on file   Social Determinants of Health   Financial Resource Strain: Low Risk    Difficulty of Paying Living Expenses: Not hard at all  Food Insecurity: No Food Insecurity   Worried About Charity fundraiser in the Last Year: Never true   Arboriculturist in the Last Year: Never true  Transportation Needs: No Transportation Needs   Lack of Transportation (Medical): No   Lack of Transportation (Non-Medical): No  Physical Activity: Sufficiently Active   Days of Exercise per Week: 5 days   Minutes of Exercise per Session: 40 min  Stress: No Stress Concern Present   Feeling of Stress : Not at all  Social Connections: Socially Integrated   Frequency of Communication with Friends and Family: Three times a week   Frequency of Social Gatherings with Friends and Family: More than three times a week   Attends Religious Services: More than 4 times per year   Active Member of [employer] or Organizations: Yes   Attends Music therapist: More than 4 times per year   Marital Status: Married  Human resources officer Violence: Not At Risk   Fear of Current or Ex-Partner: No   Emotionally Abused: No   Physically Abused: No   Sexually Abused: No    FAMILY HISTORY: Family History  Problem  Relation Age of Onset   Stroke Father    Leukemia Mother    Heart failure Brother    Pancreatic cancer Sister    Hypertension Sister    Breast cancer Sister    Heart failure Sister    Anesthesia problems Neg Hx    Hypotension Neg Hx    Malignant hyperthermia Neg Hx    Pseudochol deficiency Neg Hx    Colon cancer Neg Hx    Prostate cancer Neg Hx     ALLERGIES:  is allergic to other and amiodarone.  MEDICATIONS:  Current Outpatient Medications  Medication Sig Dispense Refill   allopurinol (ZYLOPRIM) 100 MG tablet Take by mouth.     aspirin 325 MG tablet Take by mouth.     aspirin EC 81 MG  tablet Take 1 tablet (81 mg total) by mouth daily.     atorvastatin (LIPITOR) 40 MG tablet Take 40 mg by mouth every evening.      B-D UF III MINI PEN NEEDLES 31G X 5 MM MISC USE AS DIRECTED TO INJECT LANTUS INSULIN AT AT BEDTIME 100 each 1   betamethasone dipropionate (DIPROLENE) 0.05 % cream Apply 1 application topically daily as needed (siriasis).   12   blood glucose meter kit and supplies KIT Dispense based on patient and insurance preference. Use up to 2 times daily as directed. (FOR ICD-10 E11.65) Talking blood glucose meter. 1 each 5   carvedilol (COREG) 12.5 MG tablet Take 12.5 mg by mouth 2 (two) times daily with a meal.      cephALEXin (KEFLEX) 500 MG capsule Take 1 capsule (500 mg total) by mouth 3 (three) times daily. 20 capsule 0   ciprofloxacin (CIPRO) 500 MG tablet Take 1 tablet (500 mg total) by mouth every 12 (twelve) hours. 10 tablet 0   donepezil (ARICEPT) 5 MG tablet Take 5 mg by mouth daily.     EASYMAX TEST test strip USE TO TEST TWICE DAILY. 200 strip 2   famotidine (PEPCID) 40 MG tablet Take 40 mg by mouth 2 (two) times daily.     insulin glargine (LANTUS) 100 UNIT/ML injection Inject into the skin.     LANTUS SOLOSTAR 100 UNIT/ML Solostar Pen ADMINISTER 20 UNITS UNDER THE SKIN AT BEDTIME 15 mL 1   methylPREDNISolone (MEDROL DOSEPAK) 4 MG TBPK tablet See admin  instructions. follow package directions     Misc Natural Products (TART CHERRY ADVANCED PO) Take 1,200 mg by mouth daily.      Multiple Vitamin (MULTIVITAMIN) tablet Take 1 tablet by mouth daily.      Multiple Vitamins-Minerals (PRESERVISION AREDS PO) Take 1 capsule by mouth 2 (two) times daily.      quinapril (ACCUPRIL) 20 MG tablet Take 20 mg by mouth every morning.      Tart Cherry 1200 MG CAPS Take by mouth.     No current facility-administered medications for this visit.    REVIEW OF SYSTEMS:   Review of Systems  Constitutional:  Positive for appetite change (50%) and fatigue (75%).  Gastrointestinal:  Negative for blood in stool.  Genitourinary:  Negative for hematuria.   All other systems reviewed and are negative.   PHYSICAL EXAMINATION: ECOG PERFORMANCE STATUS: 1 - Symptomatic but completely ambulatory  Vitals:   05/15/21 1317  BP: (!) 163/52  Pulse: 71  Resp: 18  Temp: (!) 97 F (36.1 C)  SpO2: 100%   Filed Weights   05/15/21 1317  Weight: 178 lb 9.6 oz (81 kg)   Physical Exam Vitals reviewed.  Constitutional:      Appearance: Normal appearance.  Cardiovascular:     Rate and Rhythm: Normal rate and regular rhythm.     Pulses: Normal pulses.     Heart sounds: Normal heart sounds.  Pulmonary:     Effort: Pulmonary effort is normal.     Breath sounds: Normal breath sounds.  Chest:  Breasts:    Right: No axillary adenopathy or supraclavicular adenopathy.     Left: No axillary adenopathy or supraclavicular adenopathy.  Musculoskeletal:     Right lower leg: Edema (2+) present.     Left lower leg: Edema (2+) present.  Lymphadenopathy:     Cervical: No cervical adenopathy.     Right cervical: No superficial cervical adenopathy.    Left cervical:  No superficial cervical adenopathy.     Upper Body:     Right upper body: No supraclavicular, axillary or pectoral adenopathy.     Left upper body: No supraclavicular, axillary or pectoral adenopathy.     Lower  Body: No right inguinal adenopathy.  Neurological:     General: No focal deficit present.     Mental Status: He is alert and oriented to person, place, and time.  Psychiatric:        Mood and Affect: Mood normal.        Behavior: Behavior normal.     LABORATORY DATA:  I have reviewed the data as listed CBC Latest Ref Rng & Units 05/15/2021 04/30/2021 05/04/2016  WBC 4.0 - 10.5 K/uL 7.3 7.6 5.5  Hemoglobin 13.0 - 17.0 g/dL 8.1(L) 7.9(L) 10.7(L)  Hematocrit 39.0 - 52.0 % 26.0(L) 24.9(L) 32.5(L)  Platelets 150 - 400 K/uL 311 252 154   CMP Latest Ref Rng & Units 05/15/2021 04/30/2021 03/28/2021  Glucose 70 - 99 mg/dL 147(H) 139(H) -  BUN 8 - 23 mg/dL 45(H) 33(H) 43(A)  Creatinine 0.61 - 1.24 mg/dL 1.74(H) 1.81(H) 1.8(A)  Sodium 135 - 145 mmol/L 135 132(L) 138  Potassium 3.5 - 5.1 mmol/L 4.5 4.8 5.2  Chloride 98 - 111 mmol/L 104 101 101  CO2 22 - 32 mmol/L '24 27 20  ' Calcium 8.9 - 10.3 mg/dL 8.7(L) 7.9(L) 8.7  Total Protein 6.5 - 8.1 g/dL 6.7 6.6 -  Total Bilirubin 0.3 - 1.2 mg/dL 0.7 0.7 -  Alkaline Phos 38 - 126 U/L 134(H) 123 151(A)  AST 15 - 41 U/L '29 24 14  ' ALT 0 - 44 U/L '31 26 14    ' RADIOGRAPHIC STUDIES: I have personally reviewed the radiological images as listed and agreed with the findings in the report. CT ABDOMEN PELVIS WO CONTRAST  Result Date: 04/30/2021 CLINICAL DATA:  87 year old male with flank pain. Concern for kidney stone. History of prostate cancer. EXAM: CT ABDOMEN AND PELVIS WITHOUT CONTRAST TECHNIQUE: Multidetector CT imaging of the abdomen and pelvis was performed following the standard protocol without IV contrast. COMPARISON:  CT abdomen pelvis dated 09/09/2019. FINDINGS: Evaluation of this exam is limited in the absence of intravenous contrast. Lower chest: Multiple bilateral pulmonary nodules measuring up to 18 mm in the right lower lobe, and new since the prior CT, consistent with metastatic disease. No intra-abdominal free air or free fluid. Hepatobiliary: The  liver is unremarkable. No intrahepatic biliary dilatation. Cholecystectomy. There is a 5 mm stone in the central CBD at the head of the pancreas. The common bile duct measures up to 9 mm in diameter. Pancreas: Unremarkable. No pancreatic ductal dilatation or surrounding inflammatory changes. Spleen: Normal in size without focal abnormality. Adrenals/Urinary Tract: The adrenal glands are unremarkable. There is mild left renal parenchyma atrophy and cortical scarring and irregularity. There is mild right hydronephrosis. No stone identified in the right ureter or in the right kidney. There is however high attenuating content within the right renal pelvis which may represent blood product or a urothelial neoplasm. Dedicated multiphasic CT urography or direct visualization with scope is recommended. The urinary bladder is grossly unremarkable. Stomach/Bowel: There is no bowel obstruction or active inflammation. The appendix is normal. Vascular/Lymphatic: Advanced aortoiliac atherosclerotic disease. The IVC is unremarkable. No portal venous gas. Retroperitoneal adenopathy measuring approximately 2 cm in short axis at the level of the left renal vasculature. Reproductive: Prostate calcification and fiducial markers noted. Small nonobstructing bilateral fat containing inguinal hernia. Other: None  Musculoskeletal: Osteopenia with degenerative changes of the spine. No acute osseous pathology. No suspicious bone lesions. IMPRESSION: 1. Multiple bilateral pulmonary nodules consistent with metastatic disease. 2. Mild right hydronephrosis. No stone identified in the right ureter or in the right kidney. High attenuating content within the right renal pelvis may represent blood product/clot or a urothelial neoplasm. Urology consult and further evaluation with dedicated multiphasic CT urography or direct visualization with scope is recommended. 3. Retroperitoneal adenopathy. 4. A 5 mm stone in the central CBD. 5. No bowel  obstruction. Normal appendix. 6. Aortic Atherosclerosis (ICD10-I70.0). Electronically Signed   By: Anner Crete M.D.   On: 04/30/2021 18:16   DG Chest Portable 1 View  Result Date: 04/30/2021 CLINICAL DATA:  Chest pain. EXAM: PORTABLE CHEST 1 VIEW COMPARISON:  September 09, 2013 FINDINGS: Postsurgical changes from CABG. Cardiomediastinal silhouette is normal. Mediastinal contours appear intact. There is no evidence of focal airspace consolidation, pleural effusion or pneumothorax. There are however multiple hyperdense pulmonary nodules in both lungs. Osseous structures are without acute abnormality. Soft tissues are grossly normal. IMPRESSION: Multiple hyperdense pulmonary nodules in both lungs. Further evaluation with chest CT may be considered, as these are suspicious for metastatic disease. Electronically Signed   By: Fidela Salisbury M.D.   On: 04/30/2021 17:53    ASSESSMENT:  1.  Lung nodules: - Presented to the ER on 04/30/2021 with flank pain and hematuria. - CTAP on 04/30/2021 showed multiple bilateral lung nodules measuring up to 18 mm in the right lower lobe.  Mild right hydronephrosis.  High attenuating content in the right renal pelvis which may represent blood product or urothelial neoplasm.  2 cm retroperitoneal lymph node at the level of the left renal vasculature. - Reports 10 pound weight loss in the last 3 to 6 months.  He did not have any hematuria after treatment for UTI during the ER visit.  2.  Prostate cancer: - Biopsy on 08/24/2019, PSA 9.0, 4 cores revealed adenocarcinoma, GS 4+3. - 28 EBRT treatments completed on 02/23/2020.  3.  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 - He quit smoking in 1984.  Smoked less than 1 pack/day for 20 years. - Mother died of acute leukemia.  2 sisters had breast cancer.    PLAN:  1.  Multiple lung nodules: -These are consistent with metastatic disease. - No major B symptoms except 10  pound weight loss in the last 3 to 6 months.  We will check LDH level. - Would recommend PET CT scan for further evaluation. - We will see him back after the PET scan.  2.  Normocytic anemia: - We will do further work-up including SPEP, free light chains, immunofixation.  Will evaluate for nutritional deficiencies including ferritin, iron panel, O03, folic acid, methylmalonic acid and copper levels. - Likely etiology is CKD and relative iron deficiency. - He has seen Dr. Theador Hawthorne for CKD.  3.  Prostate cancer: - We will also check PSA level today.  We will consider genetic testing.  All questions were answered. The patient knows to call the clinic with any problems, questions or concerns.   Derek Jack, MD, 05/15/21 5:51 PM  [facility] [phone]   I, Thana Ates, am acting as a scribe for Dr. Derek Jack.  I, Derek Jack MD, have reviewed the above documentation for accuracy and completeness, and I agree with the above.

## 2021-05-11 NOTE — Telephone Encounter (Signed)
Pt wife called and states the pt was supposed to return for a follow up May 31 but was unable to, they have found a growth in kidney and nodules in his lungs. They will call back and resch at a later date.

## 2021-05-12 ENCOUNTER — Encounter (HOSPITAL_COMMUNITY): Payer: Self-pay

## 2021-05-12 NOTE — Progress Notes (Signed)
I placed an introductory phone call to this patient today. I introduced myself and explained my role in the patient's care. I provided my contact information and encouraged the patient to call with questions or concerns. Patient reports no known concerns or questions at this time. I will plan to meet with the patient and his wife during initial visit with Dr. Delton Coombes

## 2021-05-14 ENCOUNTER — Other Ambulatory Visit: Payer: Self-pay | Admitting: "Endocrinology

## 2021-05-15 ENCOUNTER — Inpatient Hospital Stay (HOSPITAL_COMMUNITY): Payer: PPO

## 2021-05-15 ENCOUNTER — Encounter (HOSPITAL_COMMUNITY): Payer: Self-pay | Admitting: Hematology

## 2021-05-15 ENCOUNTER — Inpatient Hospital Stay (HOSPITAL_COMMUNITY): Payer: PPO | Attending: Hematology | Admitting: Hematology

## 2021-05-15 ENCOUNTER — Encounter (HOSPITAL_COMMUNITY): Payer: Self-pay

## 2021-05-15 ENCOUNTER — Other Ambulatory Visit: Payer: Self-pay

## 2021-05-15 VITALS — BP 163/52 | HR 71 | Temp 97.0°F | Resp 18 | Ht 72.0 in | Wt 178.6 lb

## 2021-05-15 DIAGNOSIS — Z87891 Personal history of nicotine dependence: Secondary | ICD-10-CM | POA: Diagnosis not present

## 2021-05-15 DIAGNOSIS — D649 Anemia, unspecified: Secondary | ICD-10-CM | POA: Insufficient documentation

## 2021-05-15 DIAGNOSIS — Z794 Long term (current) use of insulin: Secondary | ICD-10-CM | POA: Diagnosis not present

## 2021-05-15 DIAGNOSIS — Z8249 Family history of ischemic heart disease and other diseases of the circulatory system: Secondary | ICD-10-CM | POA: Insufficient documentation

## 2021-05-15 DIAGNOSIS — I10 Essential (primary) hypertension: Secondary | ICD-10-CM | POA: Insufficient documentation

## 2021-05-15 DIAGNOSIS — Z79899 Other long term (current) drug therapy: Secondary | ICD-10-CM

## 2021-05-15 DIAGNOSIS — R918 Other nonspecific abnormal finding of lung field: Secondary | ICD-10-CM | POA: Insufficient documentation

## 2021-05-15 DIAGNOSIS — Z7952 Long term (current) use of systemic steroids: Secondary | ICD-10-CM | POA: Insufficient documentation

## 2021-05-15 DIAGNOSIS — E119 Type 2 diabetes mellitus without complications: Secondary | ICD-10-CM | POA: Insufficient documentation

## 2021-05-15 DIAGNOSIS — N1330 Unspecified hydronephrosis: Secondary | ICD-10-CM | POA: Insufficient documentation

## 2021-05-15 DIAGNOSIS — Z803 Family history of malignant neoplasm of breast: Secondary | ICD-10-CM | POA: Diagnosis not present

## 2021-05-15 DIAGNOSIS — N189 Chronic kidney disease, unspecified: Secondary | ICD-10-CM | POA: Diagnosis not present

## 2021-05-15 DIAGNOSIS — Z800 Family history of malignant neoplasm of digestive organs: Secondary | ICD-10-CM | POA: Diagnosis not present

## 2021-05-15 DIAGNOSIS — E785 Hyperlipidemia, unspecified: Secondary | ICD-10-CM | POA: Insufficient documentation

## 2021-05-15 DIAGNOSIS — Z7982 Long term (current) use of aspirin: Secondary | ICD-10-CM

## 2021-05-15 DIAGNOSIS — Z806 Family history of leukemia: Secondary | ICD-10-CM | POA: Diagnosis not present

## 2021-05-15 DIAGNOSIS — C61 Malignant neoplasm of prostate: Secondary | ICD-10-CM | POA: Insufficient documentation

## 2021-05-15 LAB — RETICULOCYTES
Immature Retic Fract: 12.7 % (ref 2.3–15.9)
RBC.: 2.86 MIL/uL — ABNORMAL LOW (ref 4.22–5.81)
Retic Count, Absolute: 29.7 10*3/uL (ref 19.0–186.0)
Retic Ct Pct: 1 % (ref 0.4–3.1)

## 2021-05-15 LAB — COMPREHENSIVE METABOLIC PANEL
ALT: 31 U/L (ref 0–44)
AST: 29 U/L (ref 15–41)
Albumin: 2.9 g/dL — ABNORMAL LOW (ref 3.5–5.0)
Alkaline Phosphatase: 134 U/L — ABNORMAL HIGH (ref 38–126)
Anion gap: 7 (ref 5–15)
BUN: 45 mg/dL — ABNORMAL HIGH (ref 8–23)
CO2: 24 mmol/L (ref 22–32)
Calcium: 8.7 mg/dL — ABNORMAL LOW (ref 8.9–10.3)
Chloride: 104 mmol/L (ref 98–111)
Creatinine, Ser: 1.74 mg/dL — ABNORMAL HIGH (ref 0.61–1.24)
GFR, Estimated: 38 mL/min — ABNORMAL LOW (ref >60)
Glucose, Bld: 147 mg/dL — ABNORMAL HIGH (ref 70–99)
Potassium: 4.5 mmol/L (ref 3.5–5.1)
Sodium: 135 mmol/L (ref 135–145)
Total Bilirubin: 0.7 mg/dL (ref 0.3–1.2)
Total Protein: 6.7 g/dL (ref 6.5–8.1)

## 2021-05-15 LAB — CBC WITH DIFFERENTIAL/PLATELET
Abs Immature Granulocytes: 0.02 10*3/uL (ref 0.00–0.07)
Basophils Absolute: 0 10*3/uL (ref 0.0–0.1)
Basophils Relative: 0 %
Eosinophils Absolute: 0.2 10*3/uL (ref 0.0–0.5)
Eosinophils Relative: 3 %
HCT: 26 % — ABNORMAL LOW (ref 39.0–52.0)
Hemoglobin: 8.1 g/dL — ABNORMAL LOW (ref 13.0–17.0)
Immature Granulocytes: 0 %
Lymphocytes Relative: 14 %
Lymphs Abs: 1 10*3/uL (ref 0.7–4.0)
MCH: 28.4 pg (ref 26.0–34.0)
MCHC: 31.2 g/dL (ref 30.0–36.0)
MCV: 91.2 fL (ref 80.0–100.0)
Monocytes Absolute: 0.5 10*3/uL (ref 0.1–1.0)
Monocytes Relative: 7 %
Neutro Abs: 5.6 10*3/uL (ref 1.7–7.7)
Neutrophils Relative %: 76 %
Platelets: 311 10*3/uL (ref 150–400)
RBC: 2.85 MIL/uL — ABNORMAL LOW (ref 4.22–5.81)
RDW: 14.6 % (ref 11.5–15.5)
WBC: 7.3 10*3/uL (ref 4.0–10.5)
nRBC: 0 % (ref 0.0–0.2)

## 2021-05-15 LAB — FOLATE: Folate: 28.4 ng/mL (ref >5.9)

## 2021-05-15 LAB — IRON AND TIBC
Iron: 23 ug/dL — ABNORMAL LOW (ref 45–182)
Saturation Ratios: 11 % — ABNORMAL LOW (ref 17.9–39.5)
TIBC: 208 ug/dL — ABNORMAL LOW (ref 250–450)
UIBC: 185 ug/dL

## 2021-05-15 LAB — SAMPLE TO BLOOD BANK

## 2021-05-15 LAB — LACTATE DEHYDROGENASE: LDH: 247 U/L — ABNORMAL HIGH (ref 98–192)

## 2021-05-15 LAB — FERRITIN: Ferritin: 231 ng/mL (ref 24–336)

## 2021-05-15 LAB — VITAMIN B12: Vitamin B-12: 604 pg/mL (ref 180–914)

## 2021-05-15 NOTE — Patient Instructions (Addendum)
[place] at [facility] Discharge Instructions  You were seen and examined today by Dr. Delton Coombes. Dr. Delton Coombes is a medical oncologist, meaning he specializes in the management of cancer diagnoses. Dr. Delton Coombes discussed your past medical history, family history of cancer and the events that led to you being here today.  On your recent CT scan, there were nodules noted in your lung concerning for spread of your prostate cancer. Dr. Delton Coombes has recommended additional lab work today. This will check your blood counts, vitamins, iron levels and PSA. Dr. Delton Coombes has also recommended a PET scan. This is a specialized CT scan that illuminates where there is cancer present in your body.  Dr. Delton Coombes will see you following the PET scan.   Thank you for choosing [place] at [facility] to provide your oncology and hematology care.  To afford each patient quality time with our provider, please arrive at least 15 minutes before your scheduled appointment time.   If you have a lab appointment with the [facility] please come in thru the Main Entrance and check in at the main information desk.  You need to re-schedule your appointment should you arrive 10 or more minutes late.  We strive to give you quality time with our providers, and arriving late affects you and other patients whose appointments are after yours.  Also, if you no show three or more times for appointments you may be dismissed from the clinic at the providers discretion.     Again, thank you for choosing [facility].  Our hope is that these requests will decrease the amount of time that you wait before being seen by our physicians.       _____________________________________________________________  Should you have questions after your visit to [facility], please contact our office at [phone] and follow the prompts.  Our office hours are 8:00  a.m. and 4:30 p.m. Monday - Friday.  Please note that voicemails left after 4:00 p.m. may not be returned until the following business day.  We are closed weekends and major holidays.  You do have access to a nurse 24-7, just call the main number to the clinic [phone] and do not press any options, hold on the line and a nurse will answer the phone.    For prescription refill requests, have your pharmacy contact our office and allow 72 hours.    Due to Covid, you will need to wear a mask upon entering the hospital. If you do not have a mask, a mask will be given to you at the Main Entrance upon arrival. For doctor visits, patients may have 1 support person age 85 or older with them. For treatment visits, patients can not have anyone with them due to social distancing guidelines and our immunocompromised population.

## 2021-05-15 NOTE — Progress Notes (Signed)
I met with the patient and his wife today during and after visit with Dr. Delton Coombes. I provided time to ask questions and all were answered to patient and family's satisfaction. I provided my contact information and encouraged the patient and family to call with questions or concerns.

## 2021-05-16 LAB — PROTEIN ELECTROPHORESIS, SERUM
A/G Ratio: 1 (ref 0.7–1.7)
Albumin ELP: 3 g/dL (ref 2.9–4.4)
Alpha-1-Globulin: 0.4 g/dL (ref 0.0–0.4)
Alpha-2-Globulin: 1.1 g/dL — ABNORMAL HIGH (ref 0.4–1.0)
Beta Globulin: 0.9 g/dL (ref 0.7–1.3)
Gamma Globulin: 0.6 g/dL (ref 0.4–1.8)
Globulin, Total: 2.9 g/dL (ref 2.2–3.9)
Total Protein ELP: 5.9 g/dL — ABNORMAL LOW (ref 6.0–8.5)

## 2021-05-16 LAB — PSA: Prostatic Specific Antigen: 0.69 ng/mL (ref 0.00–4.00)

## 2021-05-16 LAB — KAPPA/LAMBDA LIGHT CHAINS
Kappa free light chain: 49.5 mg/L — ABNORMAL HIGH (ref 3.3–19.4)
Kappa, lambda light chain ratio: 0.26 (ref 0.26–1.65)
Lambda free light chains: 192.2 mg/L — ABNORMAL HIGH (ref 5.7–26.3)

## 2021-05-16 LAB — COPPER, SERUM: Copper: 190 ug/dL — ABNORMAL HIGH (ref 69–132)

## 2021-05-17 LAB — IMMUNOFIXATION ELECTROPHORESIS
IgA: 302 mg/dL (ref 61–437)
IgG (Immunoglobin G), Serum: 831 mg/dL (ref 603–1613)
IgM (Immunoglobulin M), Srm: 21 mg/dL (ref 15–143)
Total Protein ELP: 6.2 g/dL (ref 6.0–8.5)

## 2021-05-18 DIAGNOSIS — N189 Chronic kidney disease, unspecified: Secondary | ICD-10-CM | POA: Diagnosis not present

## 2021-05-18 DIAGNOSIS — I129 Hypertensive chronic kidney disease with stage 1 through stage 4 chronic kidney disease, or unspecified chronic kidney disease: Secondary | ICD-10-CM | POA: Diagnosis not present

## 2021-05-18 DIAGNOSIS — D638 Anemia in other chronic diseases classified elsewhere: Secondary | ICD-10-CM | POA: Diagnosis not present

## 2021-05-18 DIAGNOSIS — R319 Hematuria, unspecified: Secondary | ICD-10-CM | POA: Diagnosis not present

## 2021-05-18 DIAGNOSIS — R809 Proteinuria, unspecified: Secondary | ICD-10-CM | POA: Diagnosis not present

## 2021-05-18 DIAGNOSIS — E1129 Type 2 diabetes mellitus with other diabetic kidney complication: Secondary | ICD-10-CM | POA: Diagnosis not present

## 2021-05-18 DIAGNOSIS — E1122 Type 2 diabetes mellitus with diabetic chronic kidney disease: Secondary | ICD-10-CM | POA: Diagnosis not present

## 2021-05-18 DIAGNOSIS — Z79899 Other long term (current) drug therapy: Secondary | ICD-10-CM | POA: Diagnosis not present

## 2021-05-18 DIAGNOSIS — N200 Calculus of kidney: Secondary | ICD-10-CM | POA: Diagnosis not present

## 2021-05-19 LAB — METHYLMALONIC ACID, SERUM: Methylmalonic Acid, Quantitative: 206 nmol/L (ref 0–378)

## 2021-05-23 ENCOUNTER — Other Ambulatory Visit (HOSPITAL_COMMUNITY): Payer: Self-pay | Admitting: Surgery

## 2021-05-23 ENCOUNTER — Other Ambulatory Visit: Payer: Self-pay

## 2021-05-23 ENCOUNTER — Other Ambulatory Visit (HOSPITAL_COMMUNITY): Payer: Self-pay

## 2021-05-23 ENCOUNTER — Inpatient Hospital Stay (HOSPITAL_COMMUNITY): Payer: PPO

## 2021-05-23 ENCOUNTER — Telehealth (HOSPITAL_COMMUNITY): Payer: Self-pay | Admitting: Surgery

## 2021-05-23 ENCOUNTER — Other Ambulatory Visit (HOSPITAL_COMMUNITY): Payer: PPO

## 2021-05-23 DIAGNOSIS — C61 Malignant neoplasm of prostate: Secondary | ICD-10-CM

## 2021-05-23 DIAGNOSIS — D649 Anemia, unspecified: Secondary | ICD-10-CM

## 2021-05-23 DIAGNOSIS — R918 Other nonspecific abnormal finding of lung field: Secondary | ICD-10-CM | POA: Diagnosis not present

## 2021-05-23 LAB — CBC WITH DIFFERENTIAL/PLATELET
Abs Immature Granulocytes: 0.05 10*3/uL (ref 0.00–0.07)
Basophils Absolute: 0 10*3/uL (ref 0.0–0.1)
Basophils Relative: 0 %
Eosinophils Absolute: 0.1 10*3/uL (ref 0.0–0.5)
Eosinophils Relative: 2 %
HCT: 25 % — ABNORMAL LOW (ref 39.0–52.0)
Hemoglobin: 7.6 g/dL — ABNORMAL LOW (ref 13.0–17.0)
Immature Granulocytes: 1 %
Lymphocytes Relative: 11 %
Lymphs Abs: 0.9 10*3/uL (ref 0.7–4.0)
MCH: 28 pg (ref 26.0–34.0)
MCHC: 30.4 g/dL (ref 30.0–36.0)
MCV: 92.3 fL (ref 80.0–100.0)
Monocytes Absolute: 0.5 10*3/uL (ref 0.1–1.0)
Monocytes Relative: 7 %
Neutro Abs: 6.2 10*3/uL (ref 1.7–7.7)
Neutrophils Relative %: 79 %
Platelets: 314 10*3/uL (ref 150–400)
RBC: 2.71 MIL/uL — ABNORMAL LOW (ref 4.22–5.81)
RDW: 15.3 % (ref 11.5–15.5)
WBC: 7.8 10*3/uL (ref 4.0–10.5)
nRBC: 0 % (ref 0.0–0.2)

## 2021-05-23 LAB — SAMPLE TO BLOOD BANK

## 2021-05-23 LAB — PREPARE RBC (CROSSMATCH)

## 2021-05-23 NOTE — Progress Notes (Signed)
Dr. Lorenso Courier reviewed labs from today and aware of patient's continued fatigue. Orders received from 1 unit PRBCs. Patient to be scheduled for transfusion tomorrow 6/22 at 0915.

## 2021-05-23 NOTE — Telephone Encounter (Signed)
Pt's wife had left a voicemail yesterday concerned about her husband's level of fatigue.  She stated that he is so tired that he sleeps most of the day, and she thought that he was supposed to receive a blood transfusion.  I notified Tarri Abernethy, as well as showed her the pt's last lab results.  Per Eugene Garnet, the pt can come in for a CBC to see if he needs blood, but he does not need iron at this time.  Amy Nance called the pt this morning and made a lab appointment to check his CBC.  The pt verbalized understanding.

## 2021-05-24 ENCOUNTER — Encounter (HOSPITAL_COMMUNITY): Payer: Self-pay

## 2021-05-24 ENCOUNTER — Inpatient Hospital Stay (HOSPITAL_COMMUNITY): Payer: PPO

## 2021-05-24 ENCOUNTER — Other Ambulatory Visit (HOSPITAL_COMMUNITY): Payer: Self-pay | Admitting: Hematology and Oncology

## 2021-05-24 DIAGNOSIS — D649 Anemia, unspecified: Secondary | ICD-10-CM

## 2021-05-24 DIAGNOSIS — C61 Malignant neoplasm of prostate: Secondary | ICD-10-CM

## 2021-05-24 DIAGNOSIS — R918 Other nonspecific abnormal finding of lung field: Secondary | ICD-10-CM | POA: Diagnosis not present

## 2021-05-24 MED ORDER — ACETAMINOPHEN 325 MG PO TABS
650.0000 mg | ORAL_TABLET | Freq: Once | ORAL | Status: AC
  Administered 2021-05-24: 650 mg via ORAL
  Filled 2021-05-24: qty 2

## 2021-05-24 MED ORDER — SODIUM CHLORIDE 0.9% IV SOLUTION
250.0000 mL | Freq: Once | INTRAVENOUS | Status: AC
  Administered 2021-05-24: 250 mL via INTRAVENOUS

## 2021-05-24 MED ORDER — DIPHENHYDRAMINE HCL 25 MG PO CAPS
25.0000 mg | ORAL_CAPSULE | Freq: Once | ORAL | Status: AC
  Administered 2021-05-24: 25 mg via ORAL
  Filled 2021-05-24: qty 1

## 2021-05-24 NOTE — Progress Notes (Signed)
Pt here for 1 unit of PRBCs.  Hemoglobin is 7.6.  Tolerated 1 unit of PRBCs today without incidence.  Stable during and after infusion.  Vital signs stable prior to discharge.  AVS reviewed.  Discharged in stable condition ambulatory.

## 2021-05-24 NOTE — Patient Instructions (Signed)
[facility]  Discharge Instructions: Thank you for choosing [facility] to provide your oncology and hematology care.  If you have a lab appointment with the [facility], please come in thru the Main Entrance and check in at the main information desk.  Wear comfortable clothing and clothing appropriate for easy access to any Portacath or PICC line.   We strive to give you quality time with your provider. You may need to reschedule your appointment if you arrive late (15 or more minutes).  Arriving late affects you and other patients whose appointments are after yours.  Also, if you miss three or more appointments without notifying the office, you may be dismissed from the clinic at the provider's discretion.      For prescription refill requests, have your pharmacy contact our office and allow 72 hours for refills to be completed.    Today you received 1 unit of PRBCs today.   To help prevent nausea and vomiting after your treatment, we encourage you to take your nausea medication as directed.  BELOW ARE SYMPTOMS THAT SHOULD BE REPORTED IMMEDIATELY: *FEVER GREATER THAN 100.4 F (38 C) OR HIGHER *CHILLS OR SWEATING *NAUSEA AND VOMITING THAT IS NOT CONTROLLED WITH YOUR NAUSEA MEDICATION *UNUSUAL SHORTNESS OF BREATH *UNUSUAL BRUISING OR BLEEDING *URINARY PROBLEMS (pain or burning when urinating, or frequent urination) *BOWEL PROBLEMS (unusual diarrhea, constipation, pain near the anus) TENDERNESS IN MOUTH AND THROAT WITH OR WITHOUT PRESENCE OF ULCERS (sore throat, sores in mouth, or a toothache) UNUSUAL RASH, SWELLING OR PAIN  UNUSUAL VAGINAL DISCHARGE OR ITCHING   Items with * indicate a potential emergency and should be followed up as soon as possible or go to the Emergency Department if any problems should occur.  Please show the CHEMOTHERAPY ALERT CARD or IMMUNOTHERAPY ALERT CARD at check-in to the Emergency Department and triage nurse.  Should you have  questions after your visit or need to cancel or reschedule your appointment, please contact [facility] [phone]  and follow the prompts.  Office hours are 8:00 a.m. to 4:30 p.m. Monday - Friday. Please note that voicemails left after 4:00 p.m. may not be returned until the following business day.  We are closed weekends and major holidays. You have access to a nurse at all times for urgent questions. Please call the main number to the clinic [phone] and follow the prompts.  For any non-urgent questions, you may also contact your provider using MyChart. We now offer e-Visits for anyone 31 and older to request care online for non-urgent symptoms. For details visit [email or web address].   Also download the MyChart app! Go to the app store, search "MyChart", open the app, select [facility], and log in with your MyChart username and password.  Due to Covid, a mask is required upon entering the hospital/clinic. If you do not have a mask, one will be given to you upon arrival. For doctor visits, patients may have 1 support person aged 64 or older with them. For treatment visits, patients cannot have anyone with them due to current Covid guidelines and our immunocompromised population.

## 2021-05-25 LAB — TYPE AND SCREEN
ABO/RH(D): A NEG
Antibody Screen: NEGATIVE
Unit division: 0

## 2021-05-25 LAB — BPAM RBC
Blood Product Expiration Date: 202206292359
ISSUE DATE / TIME: 202206220949
Unit Type and Rh: 600

## 2021-05-30 ENCOUNTER — Telehealth (INDEPENDENT_AMBULATORY_CARE_PROVIDER_SITE_OTHER): Payer: PPO | Admitting: Urology

## 2021-05-30 ENCOUNTER — Other Ambulatory Visit: Payer: Self-pay

## 2021-05-30 DIAGNOSIS — C61 Malignant neoplasm of prostate: Secondary | ICD-10-CM

## 2021-05-30 NOTE — Progress Notes (Addendum)
History of Present Illness: Grant [name] was here today for prostate cancer surveillance.  TRUS.BX  on 9.21.2020.  At that time, PSA was 9.0.  Prostate volume was 21 g, PSAD 0.42.  4/cores revealed adenocarcinoma-2 revealed GS 4+3 pattern, 2 revealed GS 3+4 pattern.   He completed 28 EBRT treatments--completing these on 3.23.2021.   6.28.2022: PSA 0.69.  He recently had gross hematuria.  He presented to the emergency room.  CT scan revealed retroperitoneal adenopathy, lung nodules, filling defect of right renal pelvis.  He is no longer having hematuria and denies any flank pain.  He did have anemia and was transfused.  He has an upcoming PET scan.  Patient denies any significant lower urinary tract symptoms.     Past Medical History:  Diagnosis Date   Arthritis    CAD (coronary artery disease)    Diabetes mellitus    Erectile dysfunction    GERD (gastroesophageal reflux disease)    Gout 09-09-13   last flare 8'14 with gallbladder attack at [address] [facility]   Hyperlipidemia    Hypertension    Macular degeneration 09-09-13   bilateral"vision is poor"   Pancreatitis 10--8-14   8'14 -enzymes improved   Prostate cancer ([place])    Psoriasis 09-09-13   elbows, knees   RBBB    Skin cancer    forehead   Tubular adenoma     Past Surgical History:  Procedure Laterality Date   CARDIAC CATHETERIZATION  02/19/2011   3 vessel CAD   CATARACT EXTRACTION W/PHACO Left 02/14/2016   Procedure: CATARACT EXTRACTION PHACO AND INTRAOCULAR LENS PLACEMENT ([address]);  Surgeon: Rutherford Guys, MD;  Location: [facility];  Service: Ophthalmology;  Laterality: Left;  CDE:6.96   CATARACT EXTRACTION W/PHACO Right 02/28/2016   Procedure: CATARACT EXTRACTION PHACO AND INTRAOCULAR LENS PLACEMENT (I[facility]);  Surgeon: Rutherford Guys, MD;  Location: [facility];  Service: Ophthalmology;  Laterality: Right;  CDE:9.54   CHOLECYSTECTOMY N/A 10/27/2013   Procedure: LAPAROSCOPIC CHOLECYSTECTOMY WITH INTRAOPERATIVE CHOLANGIOGRAM WITH LIVER BIOPSY;  Surgeon:  Pedro Earls, MD;  Location: [facility];  Service: General;  Laterality: N/A;   COLONOSCOPY  02/25/2012   Procedure: COLONOSCOPY;  Surgeon: Daneil Dolin, MD;  Location: [facility]ITE;  Service: Endoscopy;  Laterality: N/A;  7:30 AM polyps removed.   COLONOSCOPY N/A 02/11/2015   Dr.Rourk- multiple colonic polyps bx= tubular adenoma and an inflammatory polyp   CORONARY ARTERY BYPASS GRAFT  02-28-2011   LIMA to LAD,SVG to acute marginal branch of RCA & sequential SVG to OM1 & OM2.   ESOPHAGOGASTRODUODENOSCOPY N/A 05/10/2016   RMR: mild schatzki ring, medium sized hh, H.pylori gastritis   NM MYOCAR PERF WALL MOTION  02/15/2011   High Risk   PILONIDAL CYST EXCISION     US ECHOCARDIOGRAPHY  11/09/2008   mildly impaired diastolic dysfunction - Grade I    Home Medications:  Allergies as of 05/30/2021       Reactions   Other Nausea And Vomiting   Amiodarone-unsure of reaction   Amiodarone Nausea Only        Medication List        Accurate as of May 30, 2021  2:29 PM. If you have any questions, ask your nurse or doctor.          allopurinol 100 MG tablet Commonly known as: ZYLOPRIM Take by mouth.   aspirin 325 MG tablet Take by mouth.   aspirin EC 81 MG tablet Take 1 tablet (81 mg total) by mouth daily.   atorvastatin  40 MG tablet Commonly known as: LIPITOR Take 40 mg by mouth every evening.   B-D UF III MINI PEN NEEDLES 31G X 5 MM Misc Generic drug: Insulin Pen Needle USE AS DIRECTED TO INJECT LANTUS INSULIN AT AT BEDTIME   betamethasone dipropionate 0.05 % cream Apply 1 application topically daily as needed (siriasis).   blood glucose meter kit and supplies Kit Dispense based on patient and insurance preference. Use up to 2 times daily as directed. (FOR ICD-10 E11.65) Talking blood glucose meter.   carvedilol 12.5 MG tablet Commonly known as: COREG Take 12.5 mg by mouth 2 (two) times daily with a meal.   cephALEXin 500 MG capsule Commonly known as: KEFLEX Take 1  capsule (500 mg total) by mouth 3 (three) times daily.   ciprofloxacin 500 MG tablet Commonly known as: CIPRO Take 1 tablet (500 mg total) by mouth every 12 (twelve) hours.   donepezil 5 MG tablet Commonly known as: ARICEPT Take 5 mg by mouth daily.   EasyMax Test test strip Generic drug: glucose blood USE TO TEST TWICE DAILY.   famotidine 40 MG tablet Commonly known as: PEPCID Take 40 mg by mouth 2 (two) times daily.   insulin glargine 100 UNIT/ML injection Commonly known as: LANTUS Inject into the skin.   Lantus SoloStar 100 UNIT/ML Solostar Pen Generic drug: insulin glargine ADMINISTER 20 UNITS UNDER THE SKIN AT BEDTIME   methylPREDNISolone 4 MG Tbpk tablet Commonly known as: MEDROL DOSEPAK See admin instructions. follow package directions   multivitamin tablet Take 1 tablet by mouth daily.   PRESERVISION AREDS PO Take 1 capsule by mouth 2 (two) times daily.   quinapril 20 MG tablet Commonly known as: ACCUPRIL Take 20 mg by mouth every morning.   Tart Cherry 1200 MG Caps Take by mouth.   TART CHERRY ADVANCED PO Take 1,200 mg by mouth daily.        Allergies:  Allergies  Allergen Reactions   Other Nausea And Vomiting    Amiodarone-unsure of reaction   Amiodarone Nausea Only    Family History  Problem Relation Age of Onset   Stroke Father    Leukemia Mother    Heart failure Brother    Pancreatic cancer Sister    Hypertension Sister    Breast cancer Sister    Heart failure Sister    Anesthesia problems Neg Hx    Hypotension Neg Hx    Malignant hyperthermia Neg Hx    Pseudochol deficiency Neg Hx    Colon cancer Neg Hx    Prostate cancer Neg Hx     Social History:  reports that he quit smoking about 38 years ago. His smoking use included cigarettes. He has a 12.50 pack-year smoking history. He has never used smokeless tobacco. He reports current alcohol use. He reports that he does not use drugs.  ROS: A complete review of systems was  performed.  All systems are negative except for pertinent findings as noted.  I have reviewed prior pt notes  I have reviewed urinalysis results  I have independently reviewed prior imaging-I reviewed most recent CT images  I have reviewed prior PSA results   Impression/Assessment:  1.  History of intermediate risk prostate cancer, status post radiotherapy.  Excellent PSA response, minimal urinary symptoms  2.  Right renal pelvic mass.  In light of patient's hematuria, retroperitoneal adenopathy and lung nodules, this may be metastatic urothelial carcinoma although tissue diagnosis is not present yet  Plan:  1.  I  asked the patient to have PET scan results sent to me  2.  I would suggest eventual percutaneous directed biopsy of a metastatic site for tissue diagnosis  3.  If he is found to have urothelial carcinoma, with the patient being relatively asymptomatic and having metastatic disease, as well as being 87 years old and having medical comorbidities, I do not think extirpation of his kidney is necessary.  I connected with  Versie Starks on 05/30/21 by telephone and verified that I am speaking with the correct person using two identifiers.   I discussed the limitations of evaluation and management by telemedicine. The patient expressed understanding and agreed to proceed.   Pt seen in context of Covid 19 pandemic  Pt location: Home My location: Home office Persons on call: Patient, myself, patient's wife  There was an extensive discussion about the patient's findings as well as further management.  30 minutes were spent with the patient and his wife going over his radiographic findings, most likely diagnosis, as well as further management.

## 2021-05-31 ENCOUNTER — Ambulatory Visit: Payer: PPO | Admitting: Gastroenterology

## 2021-05-31 ENCOUNTER — Other Ambulatory Visit (HOSPITAL_COMMUNITY): Payer: Self-pay

## 2021-05-31 DIAGNOSIS — C61 Malignant neoplasm of prostate: Secondary | ICD-10-CM

## 2021-05-31 DIAGNOSIS — R918 Other nonspecific abnormal finding of lung field: Secondary | ICD-10-CM

## 2021-06-01 ENCOUNTER — Ambulatory Visit (HOSPITAL_COMMUNITY)
Admission: RE | Admit: 2021-06-01 | Discharge: 2021-06-01 | Disposition: A | Discharge status: Home / Self Care | Payer: PPO | Source: Ambulatory Visit | Attending: Hematology | Admitting: Hematology

## 2021-06-01 ENCOUNTER — Ambulatory Visit (HOSPITAL_COMMUNITY): Admission: RE | Admit: 2021-06-01 | Payer: PPO | Source: Ambulatory Visit

## 2021-06-01 ENCOUNTER — Other Ambulatory Visit: Payer: Self-pay

## 2021-06-01 DIAGNOSIS — R918 Other nonspecific abnormal finding of lung field: Secondary | ICD-10-CM | POA: Diagnosis not present

## 2021-06-01 DIAGNOSIS — C61 Malignant neoplasm of prostate: Secondary | ICD-10-CM | POA: Insufficient documentation

## 2021-06-01 LAB — GLUCOSE, CAPILLARY: Glucose-Capillary: 99 mg/dL (ref 70–99)

## 2021-06-01 MED ORDER — FLUDEOXYGLUCOSE F - 18 (FDG) INJECTION
8.4500 | Freq: Once | INTRAVENOUS | Status: AC
  Administered 2021-06-01: 8.45 via INTRAVENOUS

## 2021-06-02 ENCOUNTER — Encounter (HOSPITAL_COMMUNITY): Payer: Self-pay

## 2021-06-05 NOTE — Progress Notes (Signed)
[facility] [address], [place] [address]   CLINIC:  Medical Oncology/Hematology  PROGRESS NOTE  Patient Care Team: Redmond School, MD as PCP - General (Internal Medicine) Croitoru, Dani Gobble, MD as PCP - Cardiology (Cardiology) Merita Norton, MD as Referring Physician (Specialist) Cassandria Anger, MD as Consulting Physician (Endocrinology) Gala Romney Cristopher Estimable, MD as Consulting Physician (Gastroenterology) Cira Rue, RN Nurse Navigator as Registered Nurse (Medical Oncology) Franchot Gallo, MD as Consulting Physician (Urology) Brien Mates, RN as Oncology Nurse Navigator (Oncology)  Oncological History: Evaluation of history of prostate cancer & new pulmonary nodules/ renal mass  Interval History:   [name] 87 y.o. male is here because of an evaluation of history of prostate cancer & new pulmonary nodules, at the request of Dr. Noemi Chapel. He was last seen on 05/15/2021.   Today he reports he has been well in the interim since his last visit.  He is currently in the process of establishing care with nephrology and will be seeing a nephrologist on 06/16/2021.  He notes that he is not having any weight loss, fatigue, or abdominal pain.  The bulk of our discussion focused on the results of the PET CT scan from 06/01/2021 and the steps moving forward.  MEDICAL HISTORY:  Past Medical History:  Diagnosis Date   Arthritis    CAD (coronary artery disease)    Diabetes mellitus    Erectile dysfunction    GERD (gastroesophageal reflux disease)    Gout 09-09-13   last flare 8'14 with gallbladder attack at [facility]   Hyperlipidemia    Hypertension    Macular degeneration 09-09-13   bilateral"vision is poor"   Pancreatitis 10--8-14   8'14 -enzymes improved   Prostate cancer (HCC)    Psoriasis 09-09-13   elbows, knees   RBBB    Skin cancer    forehead   Tubular adenoma     SURGICAL HISTORY: Past Surgical History:  Procedure Laterality Date    CARDIAC CATHETERIZATION  02/19/2011   3 vessel CAD   CATARACT EXTRACTION W/PHACO Left 02/14/2016   Procedure: CATARACT EXTRACTION PHACO AND INTRAOCULAR LENS PLACEMENT ([place]);  Surgeon: Rutherford Guys, MD;  Location: [facility];  Service: Ophthalmology;  Laterality: Left;  CDE:6.96   CATARACT EXTRACTION W/PHACO Right 02/28/2016   Procedure: CATARACT EXTRACTION PHACO AND INTRAOCULAR LENS PLACEMENT ([facility]);  Surgeon: Rutherford Guys, MD;  Location: [facility];  Service: Ophthalmology;  Laterality: Right;  CDE:9.54   CHOLECYSTECTOMY N/A 10/27/2013   Procedure: LAPAROSCOPIC CHOLECYSTECTOMY WITH INTRAOPERATIVE CHOLANGIOGRAM WITH LIVER BIOPSY;  Surgeon: Pedro Earls, MD;  Location: [facility];  Service: General;  Laterality: N/A;   COLONOSCOPY  02/25/2012   Procedure: COLONOSCOPY;  Surgeon: Daneil Dolin, MD;  Location: [facility];  Service: Endoscopy;  Laterality: N/A;  7:30 AM polyps removed.   COLONOSCOPY N/A 02/11/2015   Dr.Rourk- multiple colonic polyps bx= tubular adenoma and an inflammatory polyp   CORONARY ARTERY BYPASS GRAFT  02-28-2011   LIMA to LAD,SVG to acute marginal branch of RCA & sequential SVG to OM1 & OM2.   ESOPHAGOGASTRODUODENOSCOPY N/A 05/10/2016   RMR: mild schatzki ring, medium sized hh, H.pylori gastritis   NM MYOCAR PERF WALL MOTION  02/15/2011   High Risk   PILONIDAL CYST EXCISION     US ECHOCARDIOGRAPHY  11/09/2008   mildly impaired diastolic dysfunction - Grade I    SOCIAL HISTORY: Social History   Socioeconomic History   Marital status: Married  Spouse name: Not on file   Number of children: 1   Years of education: Not on file   Highest education level: Not on file  Occupational History    Comment: retired  Tobacco Use   Smoking status: Former    Packs/day: 0.50    Years: 25.00    Pack years: 12.50    Types: Cigarettes    Quit date: 02/25/1983    Years since quitting: 38.3   Smokeless tobacco: Never  Vaping Use   Vaping Use: Never used  Substance and Sexual Activity    Alcohol use: Yes    Comment: occasional   Drug use: No   Sexual activity: Not Currently  Other Topics Concern   Not on file  Social History Narrative   Not on file   Social Determinants of Health   Financial Resource Strain: Low Risk    Difficulty of Paying Living Expenses: Not hard at all  Food Insecurity: No Food Insecurity   Worried About Charity fundraiser in the Last Year: Never true   [place] in the Last Year: Never true  Transportation Needs: No Transportation Needs   Lack of Transportation (Medical): No   Lack of Transportation (Non-Medical): No  Physical Activity: Sufficiently Active   Days of Exercise per Week: 5 days   Minutes of Exercise per Session: 40 min  Stress: No Stress Concern Present   Feeling of Stress : Not at all  Social Connections: Socially Integrated   Frequency of Communication with Friends and Family: Three times a week   Frequency of Social Gatherings with Friends and Family: More than three times a week   Attends Religious Services: More than 4 times per year   Active Member of [employer] or Organizations: Yes   Attends Music therapist: More than 4 times per year   Marital Status: Married  Human resources officer Violence: Not At Risk   Fear of Current or Ex-Partner: No   Emotionally Abused: No   Physically Abused: No   Sexually Abused: No    FAMILY HISTORY: Family History  Problem Relation Age of Onset   Stroke Father    Leukemia Mother    Heart failure Brother    Pancreatic cancer Sister    Hypertension Sister    Breast cancer Sister    Heart failure Sister    Anesthesia problems Neg Hx    Hypotension Neg Hx    Malignant hyperthermia Neg Hx    Pseudochol deficiency Neg Hx    Colon cancer Neg Hx    Prostate cancer Neg Hx     ALLERGIES:  is allergic to other and amiodarone.  MEDICATIONS:  Current Outpatient Medications  Medication Sig Dispense Refill   allopurinol (ZYLOPRIM) 100 MG tablet Take by mouth.      aspirin 325 MG tablet Take by mouth.     aspirin EC 81 MG tablet Take 1 tablet (81 mg total) by mouth daily.     atorvastatin (LIPITOR) 40 MG tablet Take 40 mg by mouth every evening.      B-D UF III MINI PEN NEEDLES 31G X 5 MM MISC USE AS DIRECTED TO INJECT LANTUS INSULIN AT AT BEDTIME 100 each 1   betamethasone dipropionate (DIPROLENE) 0.05 % cream Apply 1 application topically daily as needed (siriasis).   12   blood glucose meter kit and supplies KIT Dispense based on patient and insurance preference. Use up to 2 times daily as directed. (FOR ICD-10 E11.65) Talking  blood glucose meter. 1 each 5   carvedilol (COREG) 12.5 MG tablet Take 12.5 mg by mouth 2 (two) times daily with a meal.      cephALEXin (KEFLEX) 500 MG capsule Take 1 capsule (500 mg total) by mouth 3 (three) times daily. 20 capsule 0   ciprofloxacin (CIPRO) 500 MG tablet Take 1 tablet (500 mg total) by mouth every 12 (twelve) hours. 10 tablet 0   donepezil (ARICEPT) 5 MG tablet Take 5 mg by mouth daily.     EASYMAX TEST test strip USE TO TEST TWICE DAILY. 200 strip 2   famotidine (PEPCID) 40 MG tablet Take 40 mg by mouth 2 (two) times daily.     insulin glargine (LANTUS) 100 UNIT/ML injection Inject into the skin.     LANTUS SOLOSTAR 100 UNIT/ML Solostar Pen ADMINISTER 20 UNITS UNDER THE SKIN AT BEDTIME 15 mL 1   methylPREDNISolone (MEDROL DOSEPAK) 4 MG TBPK tablet See admin instructions. follow package directions     Misc Natural Products (TART CHERRY ADVANCED PO) Take 1,200 mg by mouth daily.      Multiple Vitamin (MULTIVITAMIN) tablet Take 1 tablet by mouth daily.      Multiple Vitamins-Minerals (PRESERVISION AREDS PO) Take 1 capsule by mouth 2 (two) times daily.      quinapril (ACCUPRIL) 20 MG tablet Take 20 mg by mouth every morning.      Tart Cherry 1200 MG CAPS Take by mouth.     No current facility-administered medications for this visit.    REVIEW OF SYSTEMS:   Review of Systems  Constitutional:  Positive for  appetite change (50%) and fatigue (75%).  Gastrointestinal:  Negative for blood in stool.  Genitourinary:  Negative for hematuria.   All other systems reviewed and are negative.   PHYSICAL EXAMINATION: ECOG PERFORMANCE STATUS: 1 - Symptomatic but completely ambulatory  There were no vitals filed for this visit.  There were no vitals filed for this visit.  Physical Exam Vitals reviewed.  Constitutional:      Appearance: Normal appearance.  Cardiovascular:     Rate and Rhythm: Normal rate and regular rhythm.     Pulses: Normal pulses.     Heart sounds: Normal heart sounds.  Pulmonary:     Effort: Pulmonary effort is normal.     Breath sounds: Normal breath sounds.  Chest:  Breasts:    Right: No axillary adenopathy or supraclavicular adenopathy.     Left: No axillary adenopathy or supraclavicular adenopathy.  Musculoskeletal:     Right lower leg: Edema (2+) present.     Left lower leg: Edema (2+) present.  Lymphadenopathy:     Cervical: No cervical adenopathy.     Right cervical: No superficial cervical adenopathy.    Left cervical: No superficial cervical adenopathy.     Upper Body:     Right upper body: No supraclavicular, axillary or pectoral adenopathy.     Left upper body: No supraclavicular, axillary or pectoral adenopathy.     Lower Body: No right inguinal adenopathy.  Neurological:     General: No focal deficit present.     Mental Status: He is alert and oriented to person, place, and time.  Psychiatric:        Mood and Affect: Mood normal.        Behavior: Behavior normal.     LABORATORY DATA:  I have reviewed the data as listed CBC Latest Ref Rng & Units 05/23/2021 05/15/2021 04/30/2021  WBC 4.0 - 10.5 K/uL 7.8  7.3 7.6  Hemoglobin 13.0 - 17.0 g/dL 7.6(L) 8.1(L) 7.9(L)  Hematocrit 39.0 - 52.0 % 25.0(L) 26.0(L) 24.9(L)  Platelets 150 - 400 K/uL 314 311 252   CMP Latest Ref Rng & Units 05/15/2021 04/30/2021 03/28/2021  Glucose 70 - 99 mg/dL 147(H) 139(H) -  BUN  8 - 23 mg/dL 45(H) 33(H) 43(A)  Creatinine 0.61 - 1.24 mg/dL 1.74(H) 1.81(H) 1.8(A)  Sodium 135 - 145 mmol/L 135 132(L) 138  Potassium 3.5 - 5.1 mmol/L 4.5 4.8 5.2  Chloride 98 - 111 mmol/L 104 101 101  CO2 22 - 32 mmol/L _0 Calcium 8.9 - 10.3 mg/dL 8.7(L) 7.9(L) 8.7  Total Protein 6.5 - 8.1 g/dL 6.7 6.6 -  Total Bilirubin 0.3 - 1.2 mg/dL 0.7 0.7 -  Alkaline Phos 38 - 126 U/L 134(H) 123 151(A)  AST 15 - 41 U/L _1 ALT 0 - 44 U/L _2 RADIOGRAPHIC STUDIES: I have personally reviewed the radiological images as listed and agreed with the findings in the report. NM PET Image Initial (PI) Skull Base To Thigh  Result Date: 06/02/2021 CLINICAL DATA:  Initial treatment strategy for pulmonary nodules. EXAM: NUCLEAR MEDICINE PET SKULL BASE TO THIGH TECHNIQUE: 8.4 mCi F-18 FDG was injected intravenously. Full-ring PET imaging was performed from the skull base to thigh after the radiotracer. CT data was obtained and used for attenuation correction and anatomic localization. Fasting blood glucose: 99 mg/dl COMPARISON:  CT abdomen pelvis 05/01/2019 without contrast FINDINGS: Mediastinal blood pool activity: SUV max 2.2 Liver activity: SUV max NA NECK: No hypermetabolic lymph nodes in the neck. Incidental CT findings: none CHEST: Multiple round hypermetabolic pulmonary nodules of varying size. Example nodule in the medial RIGHT lower lobe measures 19 mm with SUV max equal 8.2. A similar LEFT lower lobe nodule cold last 22.4 cm (image 80) with SUV max equal 5.3. Example RIGHT upper lobe nodule measures 1.0 cm with SUV max equal 5.1 on image 44. There approximately 20 nodules within each lung. No hypermetabolic supraclavicular nodes. No hypermetabolic axillary nodes. Incidental CT findings: none initial ABDOMEN/PELVIS: There is hypermetabolic mass expanding the mid and lower pole of the RIGHT kidney with SUV max equal 15.9. The activity extends to the cortex and therefore is favored malignancy  rather than urine accumulation. Hypermetabolic activity extends into the RIGHT renal pelvis. The tumor mass is difficult to measure on noncontrast exam (approximately 7 cm by 5 cm by 6.8 cm). Hypermetabolic LEFT periaortic lymph nodes which are intense with SUV max equal 14.7. Example node measures 19 mm image 108/4. No pelvic adenopathy.  No abnormal activity the prostate gland. Incidental CT findings: none SKELETON: No focal hypermetabolic activity to suggest skeletal metastasis. Incidental CT findings: none IMPRESSION: 1. Large hypermetabolic mass occupying the entirety of the mid and lower pole the RIGHT kidney. IF patient cannot receive IV contrast, a non contrast MRI of the abdomen may provide some characterization of the renal malignancy. Contrast MRI would be preferred. 2. Concern for local tumor extension into the RIGHT renal pelvis. 3. Intense hypermetabolic retroperitoneal periaortic metastatic lymphadenopathy. 4. Bilateral intensely hypermetabolic multifocal pulmonary metastasis. Electronically Signed   By: Suzy Bouchard M.D.   On: 06/02/2021 16:49    ASSESSMENT:  1.  Lung nodules: - Presented to the ER on 04/30/2021 with flank pain and hematuria. - CTAP on 04/30/2021 showed multiple bilateral lung nodules measuring up to 18 mm in the right lower lobe.  Mild right hydronephrosis.  High attenuating content in the right renal pelvis which may represent blood product or urothelial neoplasm.  2 cm retroperitoneal lymph node at the level of the left renal vasculature. - Reports 10 pound weight loss in the last 3 to 6 months.  He did not have any hematuria after treatment for UTI during the ER visit.  2.  Prostate cancer: - Biopsy on 08/24/2019, PSA 9.0, 4 cores revealed adenocarcinoma, GS 4+3. - 28 EBRT treatments completed on 02/23/2020.  3.  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 - He quit smoking in 1984.  Smoked less than 1 pack/day for 20  years. - Mother died of acute leukemia.  2 sisters had breast cancer.   PLAN:  1.  Multiple lung nodules/Renal Mass: -These are consistent with metastatic disease. - No major B symptoms except 10 pound weight loss in the last 3 to 6 months.  We will check LDH level. - PET CT scan on 06/01/2021 showed findings concerning for metastatic renal cancer with a primary tumor in the right kidney.  This lesion is massive. - Recommend referral to urology for consideration of debulking nephrectomy.  If this is not feasible we will need to consider biopsy of another site of the disease in order to confirm the diagnosis -- PSA within normal limits, highly unlikely to represent prostate cancer -- Return to clinic after urological evaluation.  2.  Normocytic anemia: - We will do further work-up including SPEP, free light chains, immunofixation.  Will evaluate for nutritional deficiencies including ferritin, iron panel, G18, folic acid, methylmalonic acid and copper levels. - Likely etiology is CKD and relative iron deficiency. - He has seen Dr. Theador Hawthorne for CKD.  3.  Prostate cancer: - PSA WNL.  Current findings on PET CT scan likely do not represent prostate cancer.  All questions were answered. The patient knows to call the clinic with any problems, questions or concerns.  Ledell Peoples, MD Department of Hematology/Oncology [place] [facility] Phone: [phone] Pager: [phone] Email: [email or web address]

## 2021-06-06 ENCOUNTER — Inpatient Hospital Stay (HOSPITAL_COMMUNITY): Payer: PPO | Attending: Hematology and Oncology | Admitting: Hematology and Oncology

## 2021-06-06 ENCOUNTER — Other Ambulatory Visit: Payer: Self-pay

## 2021-06-06 VITALS — BP 159/65 | HR 73 | Temp 97.1°F | Resp 17 | Wt 179.2 lb

## 2021-06-06 DIAGNOSIS — D649 Anemia, unspecified: Secondary | ICD-10-CM | POA: Diagnosis not present

## 2021-06-06 DIAGNOSIS — E119 Type 2 diabetes mellitus without complications: Secondary | ICD-10-CM | POA: Insufficient documentation

## 2021-06-06 DIAGNOSIS — R410 Disorientation, unspecified: Secondary | ICD-10-CM | POA: Insufficient documentation

## 2021-06-06 DIAGNOSIS — C61 Malignant neoplasm of prostate: Secondary | ICD-10-CM | POA: Diagnosis not present

## 2021-06-06 DIAGNOSIS — Z79899 Other long term (current) drug therapy: Secondary | ICD-10-CM | POA: Diagnosis not present

## 2021-06-06 DIAGNOSIS — Z794 Long term (current) use of insulin: Secondary | ICD-10-CM | POA: Diagnosis not present

## 2021-06-06 DIAGNOSIS — Z87891 Personal history of nicotine dependence: Secondary | ICD-10-CM | POA: Diagnosis not present

## 2021-06-06 DIAGNOSIS — I10 Essential (primary) hypertension: Secondary | ICD-10-CM | POA: Insufficient documentation

## 2021-06-06 DIAGNOSIS — N2889 Other specified disorders of kidney and ureter: Secondary | ICD-10-CM

## 2021-06-06 DIAGNOSIS — R918 Other nonspecific abnormal finding of lung field: Secondary | ICD-10-CM

## 2021-06-07 ENCOUNTER — Other Ambulatory Visit (HOSPITAL_COMMUNITY): Payer: Self-pay

## 2021-06-07 DIAGNOSIS — R918 Other nonspecific abnormal finding of lung field: Secondary | ICD-10-CM

## 2021-06-07 DIAGNOSIS — D649 Anemia, unspecified: Secondary | ICD-10-CM

## 2021-06-07 DIAGNOSIS — C61 Malignant neoplasm of prostate: Secondary | ICD-10-CM

## 2021-06-07 DIAGNOSIS — N2889 Other specified disorders of kidney and ureter: Secondary | ICD-10-CM

## 2021-06-13 ENCOUNTER — Telehealth: Payer: Self-pay | Admitting: "Endocrinology

## 2021-06-13 NOTE — Telephone Encounter (Signed)
Pt wife called and states that his last appt had to be canceled due to patient is very weak, but she is wanting to speak with Dr. Dorris Fetch or his nurse about things going on, informed her Dr. Dorris Fetch was out of town, she was okay with nurse calling her back. States he has kidney cancer and cancer in his lungs.

## 2021-06-13 NOTE — Telephone Encounter (Signed)
Called pt. No Answer. Could leave msg this time.

## 2021-06-13 NOTE — Telephone Encounter (Signed)
Called pt. No answer. No VM.

## 2021-06-14 MED ORDER — EASYMAX V BLOOD GLUCOSE W/DEVICE KIT: 1.0000 | PACK | Freq: Two times a day (BID) | 0 refills | Status: AC

## 2021-06-14 NOTE — Telephone Encounter (Signed)
FYI   Pt wanted to let us know everything that has been going on with Grant [name]. He had a fall and hit his head in Nov and has been having issues ever since. She said that they just got him in with a neurologist. He sees them on 06/15/21.

## 2021-06-15 ENCOUNTER — Ambulatory Visit: Payer: PPO | Admitting: Neurology

## 2021-06-15 ENCOUNTER — Encounter: Payer: Self-pay | Admitting: Neurology

## 2021-06-15 ENCOUNTER — Telehealth: Payer: Self-pay | Admitting: Neurology

## 2021-06-15 VITALS — BP 149/65 | HR 80 | Ht 72.0 in | Wt 181.0 lb

## 2021-06-15 DIAGNOSIS — C7802 Secondary malignant neoplasm of left lung: Secondary | ICD-10-CM | POA: Diagnosis not present

## 2021-06-15 DIAGNOSIS — C641 Malignant neoplasm of right kidney, except renal pelvis: Secondary | ICD-10-CM | POA: Diagnosis not present

## 2021-06-15 DIAGNOSIS — R269 Unspecified abnormalities of gait and mobility: Secondary | ICD-10-CM

## 2021-06-15 DIAGNOSIS — D49511 Neoplasm of unspecified behavior of right kidney: Secondary | ICD-10-CM | POA: Diagnosis not present

## 2021-06-15 DIAGNOSIS — E1159 Type 2 diabetes mellitus with other circulatory complications: Secondary | ICD-10-CM | POA: Diagnosis not present

## 2021-06-15 DIAGNOSIS — C778 Secondary and unspecified malignant neoplasm of lymph nodes of multiple regions: Secondary | ICD-10-CM | POA: Diagnosis not present

## 2021-06-15 DIAGNOSIS — R413 Other amnesia: Secondary | ICD-10-CM

## 2021-06-15 DIAGNOSIS — R310 Gross hematuria: Secondary | ICD-10-CM | POA: Diagnosis not present

## 2021-06-15 DIAGNOSIS — C7801 Secondary malignant neoplasm of right lung: Secondary | ICD-10-CM | POA: Diagnosis not present

## 2021-06-15 NOTE — Progress Notes (Signed)
Really  [facility]  PATIENT: Grant [name] DOB: [birth year]  REFERRING DOCTOR OR PCP: Redmond School, MD SOURCE: Patient, notes from primary care, imaging reports, CT scan personally reviewed.  _________________________________   HISTORICAL  CHIEF COMPLAINT:  Chief Complaint  Patient presents with   New Patient (Initial Visit)    Rm 2, alone. Sep 02 2020. Pt had a head injury in Sep 02 2020. Later that night pt asked about family members who hav passed away/ pt had a CT scan of the head, w/ normal reading. Pt presents today to f/u with memory concern.     HISTORY OF PRESENT ILLNESS:  I had the pleasure of seeing your patient, Grant [name], at [facility] for neurologic consultation regarding his confusion and memory difficulties following a head injury.  He is an 87 year old man who fell 09/02/2020 when he fell hitting the back of his head.   His wife felt his cognition was completely noral before the fall.   He did not have any pain at the time.    He was confused afterwards asking where people who had died were.    He was placed on Aricept 5 mg after the fall and the dose was increased.      He was recently with kidney cancer.  Recent PET scan showed right lower pole mass with extension to the right rebal pelvis and hypermetabolic periaortic lymphadenopathy. And pulmonary mets.   He is going to see an oncologist today.     He also had prostate cancer treated with radiation.    The CT scan of the head from 12/16/2020 was personally reviewed.  It shows mild generalized cortical atrophy, typical for age and mild chronic microvascular ischemic change, typical for age.  There were no acute findings.  No fractures.  Montreal Cognitive Assessment  06/15/2021 06/15/2021  Visuospatial/ Executive (0/5) 0 -  Naming (0/3) 0 -  Attention: Read list of digits (0/2) 1 1  Attention: Read list of letters (0/1) 0 0  Attention: Serial 7 subtraction starting at  100 (0/3) 0 0  Language: Repeat phrase (0/2) 1 1  Language : Fluency (0/1) 0 0  Abstraction (0/2) 1 1  Delayed Recall (0/5) 0 0  Orientation (0/6) 0 0  Total 3 -  Adjusted Score (based on education) 4 -    Labs May and June 2022:   Copper  was elevated.  Kappa and Lambda light chains were elevated., ISPEP increased alpha-2-globulin, IgG/IgM, IgA were normal,   creatinine 1.8, calciu 7.9 (albumin 2.8)   REVIEW OF SYSTEMS: Constitutional: No fevers, chills, sweats, or change in appetite Eyes: Blind bilaterally due to macular degeneration. Ear, nose and throat: He has hearing loss.  No ear pain, nasal congestion, sore throat Cardiovascular: No chest pain, palpitations Respiratory:  No shortness of breath at rest or with exertion.   No wheezes GastrointestinaI: No nausea, vomiting, diarrhea, abdominal pain, fecal incontinence Genitourinary: Recent diagnosis of kidney cancer.  . Musculoskeletal:  No neck pain, back pain Integumentary: No rash, pruritus, skin lesions Neurological: as above Psychiatric: No depression at this time.  No anxiety Endocrine: No palpitations, diaphoresis, change in appetite, change in weigh or increased thirst Hematologic/Lymphatic:  No anemia, purpura, petechiae. Allergic/Immunologic: No itchy/runny eyes, nasal congestion, recent allergic reactions, rashes  ALLERGIES: Allergies  Allergen Reactions   Other Nausea And Vomiting    Amiodarone-unsure of reaction   Amiodarone Nausea Only    HOME MEDICATIONS:  Current Outpatient Medications:  allopurinol (ZYLOPRIM) 100 MG tablet, Take by mouth., Disp: , Rfl:    atorvastatin (LIPITOR) 40 MG tablet, Take 40 mg by mouth every evening. , Disp: , Rfl:    B-D UF III MINI PEN NEEDLES 31G X 5 MM MISC, USE AS DIRECTED TO INJECT LANTUS INSULIN AT AT BEDTIME, Disp: 100 each, Rfl: 1   betamethasone dipropionate (DIPROLENE) 0.05 % cream, Apply 1 application topically daily as needed (siriasis). , Disp: , Rfl: 12   blood  glucose meter kit and supplies KIT, Dispense based on patient and insurance preference. Use up to 2 times daily as directed. (FOR ICD-10 E11.65) Talking blood glucose meter., Disp: 1 each, Rfl: 5   Blood Glucose Monitoring Suppl (EASYMAX V BLOOD GLUCOSE) w/Device KIT, 1 each by Does not apply route 2 (two) times daily. E11.65, Disp: 1 kit, Rfl: 0   carvedilol (COREG) 12.5 MG tablet, Take 12.5 mg by mouth 2 (two) times daily with a meal. , Disp: , Rfl:    EASYMAX TEST test strip, USE TO TEST TWICE DAILY., Disp: 200 strip, Rfl: 2   famotidine (PEPCID) 40 MG tablet, Take 40 mg by mouth 2 (two) times daily., Disp: , Rfl:    LANTUS SOLOSTAR 100 UNIT/ML Solostar Pen, ADMINISTER 20 UNITS UNDER THE SKIN AT BEDTIME, Disp: 15 mL, Rfl: 1   Misc Natural Products (TART CHERRY ADVANCED PO), Take 1,200 mg by mouth daily. , Disp: , Rfl:    Multiple Vitamin (MULTIVITAMIN) tablet, Take 1 tablet by mouth daily. , Disp: , Rfl:    Multiple Vitamins-Minerals (PRESERVISION AREDS PO), Take 1 capsule by mouth 2 (two) times daily. , Disp: , Rfl:    quinapril (ACCUPRIL) 20 MG tablet, Take 20 mg by mouth every morning. , Disp: , Rfl:   PAST MEDICAL HISTORY: Past Medical History:  Diagnosis Date   Anemia, unspecified    Arthritis    CAD (coronary artery disease)    Cancer of kidney ([place])    Diabetes mellitus    Erectile dysfunction    GERD (gastroesophageal reflux disease)    Gout 09/09/2013   last flare 8'14 with gallbladder attack at [facility]   Hyperlipidemia    Hypertension    Lung nodule    Macular degeneration 09/09/2013   bilateral"vision is poor"   Pancreatitis 09/09/2013   8'14 -enzymes improved   Prostate cancer ([place])    Psoriasis 09/09/2013   elbows, knees   RBBB    Skin cancer    forehead   Tubular adenoma     PAST SURGICAL HISTORY: Past Surgical History:  Procedure Laterality Date   CARDIAC CATHETERIZATION  02/19/2011   3 vessel CAD   CATARACT EXTRACTION W/PHACO Left 02/14/2016   Procedure:  CATARACT EXTRACTION PHACO AND INTRAOCULAR LENS PLACEMENT ([place]);  Surgeon: Rutherford Guys, MD;  Location: [facility];  Service: Ophthalmology;  Laterality: Left;  CDE:6.96   CATARACT EXTRACTION W/PHACO Right 02/28/2016   Procedure: CATARACT EXTRACTION PHACO AND INTRAOCULAR LENS PLACEMENT (I[facility]);  Surgeon: Rutherford Guys, MD;  Location: [facility];  Service: Ophthalmology;  Laterality: Right;  CDE:9.54   CHOLECYSTECTOMY N/A 10/27/2013   Procedure: LAPAROSCOPIC CHOLECYSTECTOMY WITH INTRAOPERATIVE CHOLANGIOGRAM WITH LIVER BIOPSY;  Surgeon: Pedro Earls, MD;  Location: [facility];  Service: General;  Laterality: N/A;   COLONOSCOPY  02/25/2012   Procedure: COLONOSCOPY;  Surgeon: Daneil Dolin, MD;  Location: [facility];  Service: Endoscopy;  Laterality: N/A;  7:30 AM polyps removed.   COLONOSCOPY N/A 02/11/2015   Dr.Rourk- multiple colonic polyps  bx= tubular adenoma and an inflammatory polyp   CORONARY ARTERY BYPASS GRAFT  02-28-2011   LIMA to LAD,SVG to acute marginal branch of RCA & sequential SVG to OM1 & OM2.   ESOPHAGOGASTRODUODENOSCOPY N/A 05/10/2016   RMR: mild schatzki ring, medium sized hh, H.pylori gastritis   NM MYOCAR PERF WALL MOTION  02/15/2011   High Risk   PILONIDAL CYST EXCISION     US ECHOCARDIOGRAPHY  11/09/2008   mildly impaired diastolic dysfunction - Grade I    FAMILY HISTORY: Family History  Problem Relation Age of Onset   Stroke Father    Leukemia Mother    Heart failure Brother    Pancreatic cancer Sister    Hypertension Sister    Breast cancer Sister    Heart failure Sister    Anesthesia problems Neg Hx    Hypotension Neg Hx    Malignant hyperthermia Neg Hx    Pseudochol deficiency Neg Hx    Colon cancer Neg Hx    Prostate cancer Neg Hx     SOCIAL HISTORY:  Social History   Socioeconomic History   Marital status: Married    Spouse name: Customer service manager   Number of children: 1   Years of education: Not on file   Highest education level: 10th grade  Occupational History     Comment: retired  Tobacco Use   Smoking status: Former    Packs/day: 0.50    Years: 25.00    Pack years: 12.50    Types: Cigarettes    Quit date: 02/25/1983    Years since quitting: 38.3   Smokeless tobacco: Never  Vaping Use   Vaping Use: Never used  Substance and Sexual Activity   Alcohol use: Yes    Comment: occasional   Drug use: No   Sexual activity: Not Currently  Other Topics Concern   Not on file  Social History Narrative   Lives with wife [name]   Right handed   Caffeine: 1-2 drinks a day of coffee   Social Determinants of Health   Financial Resource Strain: Low Risk    Difficulty of Paying Living Expenses: Not hard at all  Food Insecurity: No Food Insecurity   Worried About Charity fundraiser in the Last Year: Never true   Arboriculturist in the Last Year: Never true  Transportation Needs: No Transportation Needs   Lack of Transportation (Medical): No   Lack of Transportation (Non-Medical): No  Physical Activity: Sufficiently Active   Days of Exercise per Week: 5 days   Minutes of Exercise per Session: 40 min  Stress: No Stress Concern Present   Feeling of Stress : Not at all  Social Connections: Socially Integrated   Frequency of Communication with Friends and Family: Three times a week   Frequency of Social Gatherings with Friends and Family: More than three times a week   Attends Religious Services: More than 4 times per year   Active Member of [employer] or Organizations: Yes   Attends Music therapist: More than 4 times per year   Marital Status: Married  Human resources officer Violence: Not At Risk   Fear of Current or Ex-Partner: No   Emotionally Abused: No   Physically Abused: No   Sexually Abused: No     PHYSICAL EXAM  Vitals:   06/15/21 1014  BP: (!) 149/65  Pulse: 80  Weight: 181 lb (82.1 kg)  Height: 6' (1.829 m)    Body mass index is 24.55 kg/m.  General: The patient is well-developed and well-nourished and in no acute  distress  HEENT:  Head is [place]/AT.  Sclera are anicteric.  He had hand waving vision.  Neck: No carotid bruits are noted.  The neck is nontender.  Cardiovascular: The heart has a regular rate and rhythm with a normal S1 and S2. There were no murmurs, gallops or rubs.    Skin: Extremities are without rash or  edema.  Neurologic Exam  Mental status: The patient is alert and oriented x name only at the time of the examination.  Short-term memory, focus and attention were reduced.  He scored only 4/30 on the [facility] cognitive assessment.Marland Kitchen   Speech is normal.  Cranial nerves: Extraocular movements are full.  Facial strength and sensation was normal.  Trapezius and sternocleidomastoid strength is normal. No dysarthria is noted.  The tongue is midline, and the patient has symmetric elevation of the soft palate.  Mild to moderate reduced hearing loss.  Motor:  Muscle bulk is normal.   Tone is normal. Strength is  5 / 5 in arms and proximal legs.  Strength was 4/5 with toe extension..   Sensory: He had reduced sensati to touch at the toes.  There was absent sensation to vibration at the toes and markedly reduced vibration sensation at the ankles.  In the hands there was reduced sensation in the DIP relative to the metacarpophalangeal joint.  Coordination: Cerebellar testing reveals good finger-nose-finger and slightly reduced heel-to-shin bilaterally.  Gait and station: Station is normal.   The gait had a reduced stride.  He took 7 steps to turn 180 degrees.  There was retropulsion.  He could not do tandem gait.  Romberg was mildly positive.  Reflexes: Deep tendon reflexes are symmetric and normal in the arms and reduced in the legs.   Plantar responses are flexor.    DIAGNOSTIC DATA (LABS, IMAGING, TESTING) - I reviewed patient records, labs, notes, testing and imaging myself where available.  Lab Results  Component Value Date   WBC 7.8 05/23/2021   HGB 7.6 (L) 05/23/2021   HCT 25.0 (L)  05/23/2021   MCV 92.3 05/23/2021   PLT 314 05/23/2021      Component Value Date/Time   NA 135 05/15/2021 1445   NA 138 03/28/2021 0000   K 4.5 05/15/2021 1445   CL 104 05/15/2021 1445   CO2 24 05/15/2021 1445   GLUCOSE 147 (H) 05/15/2021 1445   BUN 45 (H) 05/15/2021 1445   BUN 43 (A) 03/28/2021 0000   CREATININE 1.74 (H) 05/15/2021 1445   CREATININE 1.28 (H) 04/19/2020 1033   CALCIUM 8.7 (L) 05/15/2021 1445   PROT 6.7 05/15/2021 1445   ALBUMIN 2.9 (L) 05/15/2021 1445   AST 29 05/15/2021 1445   ALT 31 05/15/2021 1445   ALKPHOS 134 (H) 05/15/2021 1445   BILITOT 0.7 05/15/2021 1445   GFRNONAA 38 (L) 05/15/2021 1445   GFRNONAA 51 (L) 04/19/2020 1033   GFRAA 59 (L) 04/19/2020 1033   Lab Results  Component Value Date   CHOL 132 04/19/2020   HDL 49 04/19/2020   LDLCALC 70 04/19/2020   TRIG 46 04/19/2020   CHOLHDL 2.7 04/19/2020   Lab Results  Component Value Date   HGBA1C 6.8 (A) 11/01/2020   Lab Results  Component Value Date   WNIOEVOJ50 093 05/15/2021   Lab Results  Component Value Date   TSH 2.50 04/19/2020       ASSESSMENT AND PLAN  Memory loss - Plan: MR  BRAIN WO CONTRAST, Thyroid Panel With TSH  Primary malignant neoplasm of right kidney with metastasis from kidney to other site [facility]) - Plan: MR BRAIN WO CONTRAST  Gait disturbance  Type 2 diabetes mellitus with vascular disease ([place])  In summary, Grant [name] is an 87 year old man who had the onset of confusion and cognitive changes after a head injury in October 2021.  Although staging is difficult due to blindness and reduced hearing, or interaction was most consistent with a mild dementia (better than his performance on the [facility] cognitive assessment would have predicted).  CT scan in January 2022 showed generalized cortical atrophy and mild chronic microvascular ischemic changes, both fairly typical for age.  I discussed with him and his wife that a mild Alzheimer's is possible.  He could have been  well compensated before the head injury and then worsened..  Therefore, I do recommend that they continue the donepezil.  I do want to check an MRI of the brain to better assess the atrophy pattern and extent of ischemic change.  This will also help [place] rule out some other issues.  A complicating factor for his cognitive changes is that he is practically blind and is also hard of hearing.  He scored only 4/30 on the [facility] cognitive assessment but due to the blindness 22 would have been the maximum score.  He did miss every point for orientation and short-term recall.  We could consider adding memantine to the donepezil.  I did discuss with him and his wife that unfortunately medications we have to help with cognitive function offer little benefit  Another complicating factor is a recent diagnosis of kidney cancer.  Looking at the results of the PET scan, this appears to be metastatic.  He has an appointment to see oncology this afternoon.  The MRI of the brain can help to rule out any metastasis to the brain.  Other issues include poor gait and diabetic neuropathy.  The neuropathy is likely contributing to his gait issues.  We will check thyroid function.  Other labs are normal to check for dementia were performed earlier this year and were noncontributory.  We will let him or his wife know the results of the lab work and the MRI.  I did not schedule a follow-up but would be happy to see him back if there are any major changes in the future.  Grant Schar A. Felecia Shelling, MD, [facility] 8/50/2774, 12:87 PM Certified in Neurology, Clinical Neurophysiology, Sleep Medicine and Neuroimaging  [facility] [address], [place], [place] [address] [phone]

## 2021-06-15 NOTE — Telephone Encounter (Signed)
I sent patient's MRI to scheduling department at [facility], as patient lives in [place] and I assumed this would be easier for patient. I was contacted by Dorothea Ogle in the scheduling department, who advised that he reached out to patient, but patient did not want to schedule MRI and seemed confused. I then called the patient and LVM explaining the need for MRI. Asked him to call me back if he has any questions. Provided him with the number to scheduling if he wants to proceed with MRI.

## 2021-06-16 LAB — THYROID PANEL WITH TSH
Free Thyroxine Index: 1.5 (ref 1.2–4.9)
T3 Uptake Ratio: 34 % (ref 24–39)
T4, Total: 4.3 ug/dL — ABNORMAL LOW (ref 4.5–12.0)
TSH: 3.38 u[IU]/mL (ref 0.450–4.500)

## 2021-06-17 ENCOUNTER — Other Ambulatory Visit: Payer: Self-pay | Admitting: "Endocrinology

## 2021-06-19 ENCOUNTER — Other Ambulatory Visit (HOSPITAL_COMMUNITY): Payer: Self-pay | Admitting: Urology

## 2021-06-19 DIAGNOSIS — D3001 Benign neoplasm of right kidney: Secondary | ICD-10-CM

## 2021-06-19 DIAGNOSIS — C7802 Secondary malignant neoplasm of left lung: Secondary | ICD-10-CM

## 2021-06-19 DIAGNOSIS — C7801 Secondary malignant neoplasm of right lung: Secondary | ICD-10-CM

## 2021-06-19 NOTE — Telephone Encounter (Signed)
Patient's wife, [name], returned my call. She states that they received a bad report from patient's cancer doctor and she is not able to persuade him to pursue MRI at this time. She states he is not doing well. I advised patient that I would let Dr. Felecia Shelling know, asked her to call us back if they need anything/decide to pursue MRI.

## 2021-06-20 ENCOUNTER — Encounter (HOSPITAL_COMMUNITY): Payer: Self-pay | Admitting: Radiology

## 2021-06-20 DIAGNOSIS — B351 Tinea unguium: Secondary | ICD-10-CM | POA: Diagnosis not present

## 2021-06-20 DIAGNOSIS — E1142 Type 2 diabetes mellitus with diabetic polyneuropathy: Secondary | ICD-10-CM | POA: Diagnosis not present

## 2021-06-20 NOTE — Progress Notes (Signed)
Patient Demographics  Patient Name  Nicoles, Sedlacek Legal Sex  Male DOB  1934-05-21 SSN  [id number] Address  [address] [address] Phone  [phone] (Home) *Preferred*  [phone] (Mobile)     RE: CT Renal Biopsy Received: Today Suttle, Rosanne Ashing, MD  Garth Bigness D Approved for ultrasound guided right renal mass biopsy.  Contrast enhanced ultrasound recommended for biopsy guidance.   Dylan         Previous Messages    ----- Message -----  From: Garth Bigness D  Sent: 06/19/2021   6:52 PM EDT  To: Ir Procedure Requests  Subject: CT Renal Biopsy                                 Procedure:  CT Renal Biopsy   Reason:  Renal benign neoplasm, right  Secondary malignant neoplasm of right lung  Secondary malignant neoplasm of left lung  metastatic lymphadenopathy of multiple regions  Percutaneous biopsy of right renal mass or metastatic lesion   History:   CT, NM PET in computer   Provider:  Ceasar Mons   Provider Contact:  [phone]

## 2021-06-21 ENCOUNTER — Telehealth (HOSPITAL_COMMUNITY): Payer: Self-pay | Admitting: *Deleted

## 2021-06-21 NOTE — Telephone Encounter (Signed)
Wife called requesting a sooner appointment with Dr Delton Coombes, however, per Dr Lovena Neighbours he is in the process of scheduling a kidney biopsy and Dr Delton Coombes would like to wait until after the biopsy to see him.  Verbalized understanding.

## 2021-06-23 ENCOUNTER — Encounter (HOSPITAL_COMMUNITY): Payer: Self-pay

## 2021-06-23 ENCOUNTER — Other Ambulatory Visit (HOSPITAL_COMMUNITY): Payer: Self-pay

## 2021-06-23 DIAGNOSIS — D649 Anemia, unspecified: Secondary | ICD-10-CM

## 2021-06-23 DIAGNOSIS — C61 Malignant neoplasm of prostate: Secondary | ICD-10-CM

## 2021-06-23 NOTE — Progress Notes (Signed)
Patient's wife called stating he was very weak and had no appetite. Wife stated that he had same symptoms 05/24/21 and had labs ran with blood giving. PA Tarri Abernethy notified about situation and ordered for patient to come in for CBC with Diff and Blood Bank sample drawn on 06/26/21. Patient also has appointment 06/26/21 at 10 a.m. to see Dede Query PA. PA stated that if symptoms of lethargy, unresponsive, not easily aroused, or not urinating please take patient to ED immediately or call 911. PA message relayed to wife and understanding what to do.

## 2021-06-26 ENCOUNTER — Inpatient Hospital Stay (HOSPITAL_COMMUNITY): Payer: PPO

## 2021-06-26 ENCOUNTER — Other Ambulatory Visit: Payer: Self-pay

## 2021-06-26 ENCOUNTER — Inpatient Hospital Stay (HOSPITAL_COMMUNITY): Payer: PPO | Admitting: Physician Assistant

## 2021-06-26 VITALS — BP 140/49 | HR 79 | Temp 98.2°F | Resp 18

## 2021-06-26 VITALS — BP 149/59 | HR 80 | Temp 97.2°F | Resp 18 | Wt 169.6 lb

## 2021-06-26 DIAGNOSIS — D649 Anemia, unspecified: Secondary | ICD-10-CM

## 2021-06-26 DIAGNOSIS — N2889 Other specified disorders of kidney and ureter: Secondary | ICD-10-CM | POA: Diagnosis not present

## 2021-06-26 DIAGNOSIS — C61 Malignant neoplasm of prostate: Secondary | ICD-10-CM

## 2021-06-26 DIAGNOSIS — R918 Other nonspecific abnormal finding of lung field: Secondary | ICD-10-CM | POA: Diagnosis not present

## 2021-06-26 LAB — CBC WITH DIFFERENTIAL/PLATELET
Abs Immature Granulocytes: 0.06 10*3/uL (ref 0.00–0.07)
Basophils Absolute: 0 10*3/uL (ref 0.0–0.1)
Basophils Relative: 0 %
Eosinophils Absolute: 0.1 10*3/uL (ref 0.0–0.5)
Eosinophils Relative: 1 %
HCT: 23.5 % — ABNORMAL LOW (ref 39.0–52.0)
Hemoglobin: 7.2 g/dL — ABNORMAL LOW (ref 13.0–17.0)
Immature Granulocytes: 1 %
Lymphocytes Relative: 7 %
Lymphs Abs: 0.7 10*3/uL (ref 0.7–4.0)
MCH: 27.4 pg (ref 26.0–34.0)
MCHC: 30.6 g/dL (ref 30.0–36.0)
MCV: 89.4 fL (ref 80.0–100.0)
Monocytes Absolute: 0.6 10*3/uL (ref 0.1–1.0)
Monocytes Relative: 6 %
Neutro Abs: 7.8 10*3/uL — ABNORMAL HIGH (ref 1.7–7.7)
Neutrophils Relative %: 85 %
Platelets: 331 10*3/uL (ref 150–400)
RBC: 2.63 MIL/uL — ABNORMAL LOW (ref 4.22–5.81)
RDW: 16.4 % — ABNORMAL HIGH (ref 11.5–15.5)
WBC: 9.2 10*3/uL (ref 4.0–10.5)
nRBC: 0 % (ref 0.0–0.2)

## 2021-06-26 LAB — IRON AND TIBC
Iron: 15 ug/dL — ABNORMAL LOW (ref 45–182)
Saturation Ratios: 9 % — ABNORMAL LOW (ref 17.9–39.5)
TIBC: 168 ug/dL — ABNORMAL LOW (ref 250–450)
UIBC: 153 ug/dL

## 2021-06-26 LAB — VITAMIN B12: Vitamin B-12: 773 pg/mL (ref 180–914)

## 2021-06-26 LAB — PREPARE RBC (CROSSMATCH)

## 2021-06-26 LAB — FOLATE: Folate: 19.4 ng/mL (ref >5.9)

## 2021-06-26 LAB — FERRITIN: Ferritin: 490 ng/mL — ABNORMAL HIGH (ref 24–336)

## 2021-06-26 MED ORDER — ACETAMINOPHEN 325 MG PO TABS
650.0000 mg | ORAL_TABLET | Freq: Once | ORAL | Status: AC
  Administered 2021-06-26: 650 mg via ORAL
  Filled 2021-06-26: qty 2

## 2021-06-26 MED ORDER — SODIUM CHLORIDE 0.9% IV SOLUTION
250.0000 mL | Freq: Once | INTRAVENOUS | Status: AC
  Administered 2021-06-26: 250 mL via INTRAVENOUS

## 2021-06-26 MED ORDER — DIPHENHYDRAMINE HCL 25 MG PO CAPS
25.0000 mg | ORAL_CAPSULE | Freq: Once | ORAL | Status: AC
  Administered 2021-06-26: 25 mg via ORAL
  Filled 2021-06-26: qty 1

## 2021-06-26 NOTE — Patient Instructions (Signed)
[facility]  Discharge Instructions: Thank you for choosing [facility] to provide your oncology and hematology care.  If you have a lab appointment with the [facility], please come in thru the Main Entrance and check in at the main information desk.  Wear comfortable clothing and clothing appropriate for easy access to any Portacath or PICC line.   We strive to give you quality time with your provider. You may need to reschedule your appointment if you arrive late (15 or more minutes).  Arriving late affects you and other patients whose appointments are after yours.  Also, if you miss three or more appointments without notifying the office, you may be dismissed from the clinic at the provider's discretion.      For prescription refill requests, have your pharmacy contact our office and allow 72 hours for refills to be completed.    Today you received the following: 1 unit of blood, return as scheduled.   To help prevent nausea and vomiting after your treatment, we encourage you to take your nausea medication as directed.  BELOW ARE SYMPTOMS THAT SHOULD BE REPORTED IMMEDIATELY: *FEVER GREATER THAN 100.4 F (38 C) OR HIGHER *CHILLS OR SWEATING *NAUSEA AND VOMITING THAT IS NOT CONTROLLED WITH YOUR NAUSEA MEDICATION *UNUSUAL SHORTNESS OF BREATH *UNUSUAL BRUISING OR BLEEDING *URINARY PROBLEMS (pain or burning when urinating, or frequent urination) *BOWEL PROBLEMS (unusual diarrhea, constipation, pain near the anus) TENDERNESS IN MOUTH AND THROAT WITH OR WITHOUT PRESENCE OF ULCERS (sore throat, sores in mouth, or a toothache) UNUSUAL RASH, SWELLING OR PAIN  UNUSUAL VAGINAL DISCHARGE OR ITCHING   Items with * indicate a potential emergency and should be followed up as soon as possible or go to the Emergency Department if any problems should occur.  Please show the CHEMOTHERAPY ALERT CARD or IMMUNOTHERAPY ALERT CARD at check-in to the Emergency Department and triage  nurse.  Should you have questions after your visit or need to cancel or reschedule your appointment, please contact [facility] [phone]  and follow the prompts.  Office hours are 8:00 a.m. to 4:30 p.m. Monday - Friday. Please note that voicemails left after 4:00 p.m. may not be returned until the following business day.  We are closed weekends and major holidays. You have access to a nurse at all times for urgent questions. Please call the main number to the clinic [phone] and follow the prompts.  For any non-urgent questions, you may also contact your provider using MyChart. We now offer e-Visits for anyone 43 and older to request care online for non-urgent symptoms. For details visit [email or web address].   Also download the MyChart app! Go to the app store, search "MyChart", open the app, select [facility], and log in with your MyChart username and password.  Due to Covid, a mask is required upon entering the hospital/clinic. If you do not have a mask, one will be given to you upon arrival. For doctor visits, patients may have 1 support person aged 65 or older with them. For treatment visits, patients cannot have anyone with them due to current Covid guidelines and our immunocompromised population.

## 2021-06-26 NOTE — Progress Notes (Signed)
[facility] [address], [place] [address]   CLINIC:  Medical Oncology/Hematology  PROGRESS NOTE  Patient Care Team: Redmond School, MD as PCP - General (Internal Medicine) Croitoru, Dani Gobble, MD as PCP - Cardiology (Cardiology) Merita Norton, MD as Referring Physician (Specialist) Cassandria Anger, MD as Consulting Physician (Endocrinology) Gala Romney Cristopher Estimable, MD as Consulting Physician (Gastroenterology) Cira Rue, RN Nurse Navigator as Registered Nurse (Medical Oncology) Franchot Gallo, MD as Consulting Physician (Urology) Brien Mates, RN as Oncology Nurse Navigator (Oncology)  Oncological History: Evaluation of history of prostate cancer & new pulmonary nodules/ renal mass  Interval History:   [name] 87 y.o. male is here for a follow up as he is currently undergoing diagnostic workup for a large right kidney mass and bilateral pulmonary nodules. Patient was last seen on 06/06/2021.   Today he reports progressive fatigue since his last visit.  He is sitting on the couch most of the day. He is able to complete his basic ADLs on his own including self care. He reports a fair appetite with weight loss. He denies any nausea, vomiting or abdominal pain. His bowel movements are regular without any constipation or diarrhea. Patient's wife reports that she noticed a streak of blood in patient's urine today. Otherwise patient has intermittent episodes of hematuria. Patient has no other signs of bleeding. Patient has bilateral lower extremity edema which has started taking diuretic once a day. He denies any fevers, chills, night sweats, shortness of breath, chest pain or cough. He has no other complaints.   MEDICAL HISTORY:  Past Medical History:  Diagnosis Date   Anemia, unspecified    Arthritis    CAD (coronary artery disease)    Cancer of kidney ([place])    Diabetes mellitus    Erectile dysfunction    GERD (gastroesophageal reflux disease)     Gout 09/09/2013   last flare 8'14 with gallbladder attack at [facility]   Hyperlipidemia    Hypertension    Lung nodule    Macular degeneration 09/09/2013   bilateral"vision is poor"   Pancreatitis 09/09/2013   8'14 -enzymes improved   Prostate cancer ([place])    Psoriasis 09/09/2013   elbows, knees   RBBB    Skin cancer    forehead   Tubular adenoma     SURGICAL HISTORY: Past Surgical History:  Procedure Laterality Date   CARDIAC CATHETERIZATION  02/19/2011   3 vessel CAD   CATARACT EXTRACTION W/PHACO Left 02/14/2016   Procedure: CATARACT EXTRACTION PHACO AND INTRAOCULAR LENS PLACEMENT ([place]);  Surgeon: Rutherford Guys, MD;  Location: [facility];  Service: Ophthalmology;  Laterality: Left;  CDE:6.96   CATARACT EXTRACTION W/PHACO Right 02/28/2016   Procedure: CATARACT EXTRACTION PHACO AND INTRAOCULAR LENS PLACEMENT (I[facility]);  Surgeon: Rutherford Guys, MD;  Location: [facility];  Service: Ophthalmology;  Laterality: Right;  CDE:9.54   CHOLECYSTECTOMY N/A 10/27/2013   Procedure: LAPAROSCOPIC CHOLECYSTECTOMY WITH INTRAOPERATIVE CHOLANGIOGRAM WITH LIVER BIOPSY;  Surgeon: Pedro Earls, MD;  Location: [facility];  Service: General;  Laterality: N/A;   COLONOSCOPY  02/25/2012   Procedure: COLONOSCOPY;  Surgeon: Daneil Dolin, MD;  Location: [facility];  Service: Endoscopy;  Laterality: N/A;  7:30 AM polyps removed.   COLONOSCOPY N/A 02/11/2015   Dr.Rourk- multiple colonic polyps bx= tubular adenoma and an inflammatory polyp   CORONARY ARTERY BYPASS GRAFT  02-28-2011   LIMA to LAD,SVG to acute marginal branch of RCA & sequential SVG to OM1 & OM2.  ESOPHAGOGASTRODUODENOSCOPY N/A 05/10/2016   RMR: mild schatzki ring, medium sized hh, H.pylori gastritis   NM MYOCAR PERF WALL MOTION  02/15/2011   High Risk   PILONIDAL CYST EXCISION     US ECHOCARDIOGRAPHY  11/09/2008   mildly impaired diastolic dysfunction - Grade I    SOCIAL HISTORY: Social History   Socioeconomic History   Marital status: Married    Spouse  name: Grant [name]   Number of children: 1   Years of education: Not on file   Highest education level: 10th grade  Occupational History    Comment: retired  Tobacco Use   Smoking status: Former    Packs/day: 0.50    Years: 25.00    Pack years: 12.50    Types: Cigarettes    Quit date: 02/25/1983    Years since quitting: 38.3   Smokeless tobacco: Never  Vaping Use   Vaping Use: Never used  Substance and Sexual Activity   Alcohol use: Yes    Comment: occasional   Drug use: No   Sexual activity: Not Currently  Other Topics Concern   Not on file  Social History Narrative   Lives with wife Grant [name]   Right handed   Caffeine: 1-2 drinks a day of coffee   Social Determinants of Health   Financial Resource Strain: Low Risk    Difficulty of Paying Living Expenses: Not hard at all  Food Insecurity: No Food Insecurity   Worried About Charity fundraiser in the Last Year: Never true   Otho in the Last Year: Never true  Transportation Needs: No Transportation Needs   Lack of Transportation (Medical): No   Lack of Transportation (Non-Medical): No  Physical Activity: Sufficiently Active   Days of Exercise per Week: 5 days   Minutes of Exercise per Session: 40 min  Stress: No Stress Concern Present   Feeling of Stress : Not at all  Social Connections: Socially Integrated   Frequency of Communication with Friends and Family: Three times a week   Frequency of Social Gatherings with Friends and Family: More than three times a week   Attends Religious Services: More than 4 times per year   Active Member of [employer] or Organizations: Yes   Attends Music therapist: More than 4 times per year   Marital Status: Married  Human resources officer Violence: Not At Risk   Fear of Current or Ex-Partner: No   Emotionally Abused: No   Physically Abused: No   Sexually Abused: No    FAMILY HISTORY: Family History  Problem Relation Age of Onset   Stroke Father    Leukemia Mother     Heart failure Brother    Pancreatic cancer Sister    Hypertension Sister    Breast cancer Sister    Heart failure Sister    Anesthesia problems Neg Hx    Hypotension Neg Hx    Malignant hyperthermia Neg Hx    Pseudochol deficiency Neg Hx    Colon cancer Neg Hx    Prostate cancer Neg Hx     ALLERGIES:  is allergic to cephalexin, other, and amiodarone.  MEDICATIONS:  Current Outpatient Medications  Medication Sig Dispense Refill   allopurinol (ZYLOPRIM) 100 MG tablet Take by mouth.     atorvastatin (LIPITOR) 40 MG tablet Take 40 mg by mouth every evening.      B-D UF III MINI PEN NEEDLES 31G X 5 MM MISC USE AS DIRECTED TO INJECT LANTUS INSULIN AT AT  BEDTIME 100 each 1   betamethasone dipropionate (DIPROLENE) 0.05 % cream Apply 1 application topically daily as needed (siriasis).   12   blood glucose meter kit and supplies KIT Dispense based on patient and insurance preference. Use up to 2 times daily as directed. (FOR ICD-10 E11.65) Talking blood glucose meter. 1 each 5   Blood Glucose Monitoring Suppl (EASYMAX V BLOOD GLUCOSE) w/Device KIT 1 each by Does not apply route 2 (two) times daily. E11.65 1 kit 0   Blood Glucose Monitoring Suppl (ONE TOUCH ULTRA 2) w/Device KIT 1 each by Other route 2 (two) times daily. 1 kit 0   carvedilol (COREG) 12.5 MG tablet Take 12.5 mg by mouth 2 (two) times daily with a meal.      EASYMAX TEST test strip USE TO TEST TWICE DAILY. 200 strip 2   famotidine (PEPCID) 40 MG tablet Take 40 mg by mouth 2 (two) times daily.     LANTUS SOLOSTAR 100 UNIT/ML Solostar Pen ADMINISTER 20 UNITS UNDER THE SKIN AT BEDTIME 15 mL 1   Misc Natural Products (TART CHERRY ADVANCED PO) Take 1,200 mg by mouth daily.      Multiple Vitamin (MULTIVITAMIN) tablet Take 1 tablet by mouth daily.      Multiple Vitamins-Minerals (PRESERVISION AREDS PO) Take 1 capsule by mouth 2 (two) times daily.      quinapril (ACCUPRIL) 20 MG tablet Take 20 mg by mouth every morning.      No current  facility-administered medications for this visit.    REVIEW OF SYSTEMS:   Review of Systems  Constitutional:  Positive for appetite change (25%) and fatigue (No energy). Negative for fever.  Respiratory:  Negative for cough and shortness of breath.   Cardiovascular:  Positive for leg swelling. Negative for chest pain.  Gastrointestinal:  Negative for blood in stool, constipation, diarrhea, nausea and vomiting.  Genitourinary:  Positive for hematuria.   Skin:  Negative for itching and rash.  Neurological:  Negative for headaches.  All other systems reviewed and are negative.   PHYSICAL EXAMINATION: ECOG PERFORMANCE STATUS: 1 - Symptomatic but completely ambulatory  Vitals:   06/26/21 0931  BP: (!) 149/59  Pulse: 80  Resp: 18  Temp: (!) 97.2 F (36.2 C)  SpO2: 99%    Filed Weights   06/26/21 0931  Weight: 169 lb 9.6 oz (76.9 kg)    Physical Exam Vitals reviewed.  Constitutional:      Appearance: Normal appearance.  Cardiovascular:     Rate and Rhythm: Normal rate and regular rhythm.     Pulses: Normal pulses.     Heart sounds: Normal heart sounds.  Pulmonary:     Effort: Pulmonary effort is normal.     Breath sounds: Normal breath sounds.  Chest:  Breasts:    Right: No axillary adenopathy or supraclavicular adenopathy.     Left: No axillary adenopathy or supraclavicular adenopathy.  Musculoskeletal:     Right lower leg: Edema (2+) present.     Left lower leg: Edema (2+) present.  Lymphadenopathy:     Cervical: No cervical adenopathy.     Right cervical: No superficial cervical adenopathy.    Left cervical: No superficial cervical adenopathy.     Upper Body:     Right upper body: No supraclavicular, axillary or pectoral adenopathy.     Left upper body: No supraclavicular, axillary or pectoral adenopathy.     Lower Body: No right inguinal adenopathy.  Neurological:     General: No focal  deficit present.     Mental Status: He is alert and oriented to person,  place, and time.  Psychiatric:        Mood and Affect: Mood normal.        Behavior: Behavior normal.     LABORATORY DATA:  I have reviewed the data as listed CBC Latest Ref Rng & Units 06/26/2021 05/23/2021 05/15/2021  WBC 4.0 - 10.5 K/uL 9.2 7.8 7.3  Hemoglobin 13.0 - 17.0 g/dL 7.2(L) 7.6(L) 8.1(L)  Hematocrit 39.0 - 52.0 % 23.5(L) 25.0(L) 26.0(L)  Platelets 150 - 400 K/uL 331 314 311   CMP Latest Ref Rng & Units 05/15/2021 04/30/2021 03/28/2021  Glucose 70 - 99 mg/dL 147(H) 139(H) -  BUN 8 - 23 mg/dL 45(H) 33(H) 43(A)  Creatinine 0.61 - 1.24 mg/dL 1.74(H) 1.81(H) 1.8(A)  Sodium 135 - 145 mmol/L 135 132(L) 138  Potassium 3.5 - 5.1 mmol/L 4.5 4.8 5.2  Chloride 98 - 111 mmol/L 104 101 101  CO2 22 - 32 mmol/L '24 27 20  ' Calcium 8.9 - 10.3 mg/dL 8.7(L) 7.9(L) 8.7  Total Protein 6.5 - 8.1 g/dL 6.7 6.6 -  Total Bilirubin 0.3 - 1.2 mg/dL 0.7 0.7 -  Alkaline Phos 38 - 126 U/L 134(H) 123 151(A)  AST 15 - 41 U/L '29 24 14  ' ALT 0 - 44 U/L '31 26 14    ' RADIOGRAPHIC STUDIES: I have personally reviewed the radiological images as listed and agreed with the findings in the report. NM PET Image Initial (PI) Skull Base To Thigh  Result Date: 06/02/2021 CLINICAL DATA:  Initial treatment strategy for pulmonary nodules. EXAM: NUCLEAR MEDICINE PET SKULL BASE TO THIGH TECHNIQUE: 8.4 mCi F-18 FDG was injected intravenously. Full-ring PET imaging was performed from the skull base to thigh after the radiotracer. CT data was obtained and used for attenuation correction and anatomic localization. Fasting blood glucose: 99 mg/dl COMPARISON:  CT abdomen pelvis 05/01/2019 without contrast FINDINGS: Mediastinal blood pool activity: SUV max 2.2 Liver activity: SUV max NA NECK: No hypermetabolic lymph nodes in the neck. Incidental CT findings: none CHEST: Multiple round hypermetabolic pulmonary nodules of varying size. Example nodule in the medial RIGHT lower lobe measures 19 mm with SUV max equal 8.2. A similar LEFT lower  lobe nodule cold last 22.4 cm (image 80) with SUV max equal 5.3. Example RIGHT upper lobe nodule measures 1.0 cm with SUV max equal 5.1 on image 44. There approximately 20 nodules within each lung. No hypermetabolic supraclavicular nodes. No hypermetabolic axillary nodes. Incidental CT findings: none initial ABDOMEN/PELVIS: There is hypermetabolic mass expanding the mid and lower pole of the RIGHT kidney with SUV max equal 15.9. The activity extends to the cortex and therefore is favored malignancy rather than urine accumulation. Hypermetabolic activity extends into the RIGHT renal pelvis. The tumor mass is difficult to measure on noncontrast exam (approximately 7 cm by 5 cm by 6.8 cm). Hypermetabolic LEFT periaortic lymph nodes which are intense with SUV max equal 14.7. Example node measures 19 mm image 108/4. No pelvic adenopathy.  No abnormal activity the prostate gland. Incidental CT findings: none SKELETON: No focal hypermetabolic activity to suggest skeletal metastasis. Incidental CT findings: none IMPRESSION: 1. Large hypermetabolic mass occupying the entirety of the mid and lower pole the RIGHT kidney. IF patient cannot receive IV contrast, a non contrast MRI of the abdomen may provide some characterization of the renal malignancy. Contrast MRI would be preferred. 2. Concern for local tumor extension into the RIGHT renal pelvis. 3.  Intense hypermetabolic retroperitoneal periaortic metastatic lymphadenopathy. 4. Bilateral intensely hypermetabolic multifocal pulmonary metastasis. Electronically Signed   By: Suzy Bouchard M.D.   On: 06/02/2021 16:49    ASSESSMENT:  1.  Lung nodules/Renal Mass: - Presented to the ER on 04/30/2021 with flank pain and hematuria. - CTAP on 04/30/2021 showed multiple bilateral lung nodules measuring up to 18 mm in the right lower lobe.  Mild right hydronephrosis.  High attenuating content in the right renal pelvis which may represent blood product or urothelial neoplasm.  2  cm retroperitoneal lymph node at the level of the left renal vasculature. - PET scan on 06/01/2021 showed large hypermetabolic mass occupying the entirety of the mid and lower pole of the right kidney. Concern for local tumor extension into the right renal pelvis. Hypermetabolic retroperitoneal/periaortic lymph adenopathy and bilateral hypermetabolic multifocal pulmonary nodules.   2.  Prostate cancer: - Biopsy on 08/24/2019, PSA 9.0, 4 cores revealed adenocarcinoma, GS 4+3. - 28 EBRT treatments completed on 02/23/2020.  3.  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 - He quit smoking in 1984.  Smoked less than 1 pack/day for 20 years. - Mother died of acute leukemia.  2 sisters had breast cancer.   PLAN:  1.  Multiple lung nodules/Renal Mass: - PET CT scan on 06/01/2021 showed large hypermetabolic mass occupying the entirety of the mid and lower pole of the right kidney. Concern for local tumor extension into the right renal pelvis. Hypermetabolic retroperitoneal/periaortic lymph adenopathy and bilateral hypermetabolic multifocal pulmonary nodules.  -- PSA within normal limits, highly unlikely to represent prostate cancer --Patient is scheduled for US guided renal biopsy on 07/03/2021. --Scheduled for a follow up with Dr. Delton Coombes on 07/06/2021.   2.  Normocytic anemia: --Labs from 05/15/2021 ruled out copper deficiency, B12 deficiency, folate deficiency, paraproteinemia. There was a component of iron deficiency with saturation of 11%. --CBC today show Hgb 7.2, MCV 89.4.  --Received 1 unit of pRBC today --Repeated iron, B12 and folate levels today. Recommend IV feraheme x 2 in the setting of low saturation and TIBC.  --We will plan for patient to return on Thursday 06/29/2021 with repeat labs and arrange for IV feraheme. Second dose will be tentatively planned for 07/06/2021.   3.  Prostate cancer: - PSA WNL.  Current findings on PET CT scan likely do  not represent prostate cancer.  All questions were answered. The patient knows to call the clinic with any problems, questions or concerns.  I have spent a total of 30 minutes minutes of face-to-face and non-face-to-face time, preparing to see the patient, obtaining and/or reviewing separately obtained history, performing a medically appropriate examination, counseling and educating the patient, ordering medications/tests, documenting clinical information in the electronic health record,and care coordination.   Lincoln Brigham, PA-C Hematology and [place] at [facility]

## 2021-06-26 NOTE — Progress Notes (Signed)
Patient has symptomatic anemia with Hgb 7.2.  He needs to be scheduled for PRBC transfusion x1 as soon as possible.  If he experiences any alarm symptoms such as chest pain, extreme weakness, syncopal episodes, or severe dyspnea, he should proceed to the emergency department immediately.

## 2021-06-26 NOTE — Progress Notes (Signed)
Patient presents today for 1 unit of PRBCs. Patient tolerated PRBCs infusion with no complaints voiced. Peripheral IV site clean and dry with good blood return noted before and after infusion. Band aid applied. VSS with discharge and left in satisfactory condition via wheelchair with wife, with no s/s of distress noted.

## 2021-06-27 ENCOUNTER — Encounter (HOSPITAL_COMMUNITY): Payer: Self-pay | Admitting: Physician Assistant

## 2021-06-27 LAB — TYPE AND SCREEN
ABO/RH(D): A NEG
Antibody Screen: NEGATIVE
Unit division: 0

## 2021-06-27 LAB — BPAM RBC
Blood Product Expiration Date: 202208192359
ISSUE DATE / TIME: 202207251152
Unit Type and Rh: 600

## 2021-06-29 ENCOUNTER — Inpatient Hospital Stay (HOSPITAL_COMMUNITY): Payer: PPO

## 2021-06-29 ENCOUNTER — Inpatient Hospital Stay (HOSPITAL_BASED_OUTPATIENT_CLINIC_OR_DEPARTMENT_OTHER): Payer: PPO | Admitting: Nurse Practitioner

## 2021-06-29 ENCOUNTER — Other Ambulatory Visit: Payer: Self-pay

## 2021-06-29 ENCOUNTER — Other Ambulatory Visit: Payer: Self-pay | Admitting: Radiology

## 2021-06-29 ENCOUNTER — Inpatient Hospital Stay (HOSPITAL_COMMUNITY): Payer: PPO | Admitting: Dietician

## 2021-06-29 VITALS — BP 143/55 | HR 57 | Temp 98.1°F | Resp 18

## 2021-06-29 VITALS — BP 176/75 | HR 63 | Temp 98.8°F | Resp 19 | Wt 173.4 lb

## 2021-06-29 DIAGNOSIS — R410 Disorientation, unspecified: Secondary | ICD-10-CM | POA: Diagnosis not present

## 2021-06-29 DIAGNOSIS — D649 Anemia, unspecified: Secondary | ICD-10-CM

## 2021-06-29 DIAGNOSIS — R918 Other nonspecific abnormal finding of lung field: Secondary | ICD-10-CM | POA: Diagnosis not present

## 2021-06-29 DIAGNOSIS — N2889 Other specified disorders of kidney and ureter: Secondary | ICD-10-CM

## 2021-06-29 DIAGNOSIS — D500 Iron deficiency anemia secondary to blood loss (chronic): Secondary | ICD-10-CM | POA: Diagnosis not present

## 2021-06-29 LAB — SAMPLE TO BLOOD BANK

## 2021-06-29 LAB — CBC WITH DIFFERENTIAL/PLATELET
Abs Immature Granulocytes: 0.04 10*3/uL (ref 0.00–0.07)
Basophils Absolute: 0 10*3/uL (ref 0.0–0.1)
Basophils Relative: 0 %
Eosinophils Absolute: 0.1 10*3/uL (ref 0.0–0.5)
Eosinophils Relative: 1 %
HCT: 27.7 % — ABNORMAL LOW (ref 39.0–52.0)
Hemoglobin: 8.6 g/dL — ABNORMAL LOW (ref 13.0–17.0)
Immature Granulocytes: 0 %
Lymphocytes Relative: 7 %
Lymphs Abs: 0.7 10*3/uL (ref 0.7–4.0)
MCH: 28.3 pg (ref 26.0–34.0)
MCHC: 31 g/dL (ref 30.0–36.0)
MCV: 91.1 fL (ref 80.0–100.0)
Monocytes Absolute: 0.6 10*3/uL (ref 0.1–1.0)
Monocytes Relative: 6 %
Neutro Abs: 9.1 10*3/uL — ABNORMAL HIGH (ref 1.7–7.7)
Neutrophils Relative %: 86 %
Platelets: 336 10*3/uL (ref 150–400)
RBC: 3.04 MIL/uL — ABNORMAL LOW (ref 4.22–5.81)
RDW: 16.6 % — ABNORMAL HIGH (ref 11.5–15.5)
WBC: 10.6 10*3/uL — ABNORMAL HIGH (ref 4.0–10.5)
nRBC: 0 % (ref 0.0–0.2)

## 2021-06-29 MED ORDER — SODIUM CHLORIDE 0.9 % IV SOLN
510.0000 mg | Freq: Once | INTRAVENOUS | Status: AC
  Administered 2021-06-29: 510 mg via INTRAVENOUS
  Filled 2021-06-29: qty 510

## 2021-06-29 MED ORDER — ACETAMINOPHEN 325 MG PO TABS
650.0000 mg | ORAL_TABLET | Freq: Once | ORAL | Status: AC
  Administered 2021-06-29: 650 mg via ORAL
  Filled 2021-06-29: qty 2

## 2021-06-29 MED ORDER — SODIUM CHLORIDE 0.9 % IV SOLN: Freq: Once | INTRAVENOUS | Status: AC

## 2021-06-29 MED ORDER — LORATADINE 10 MG PO TABS
10.0000 mg | ORAL_TABLET | Freq: Once | ORAL | Status: AC
  Administered 2021-06-29: 10 mg via ORAL
  Filled 2021-06-29: qty 1

## 2021-06-29 MED ORDER — FAMOTIDINE 20 MG PO TABS
20.0000 mg | ORAL_TABLET | Freq: Once | ORAL | Status: AC
  Administered 2021-06-29: 20 mg via ORAL
  Filled 2021-06-29: qty 1

## 2021-06-29 NOTE — Progress Notes (Signed)
Patient presents today for Feraheme infusion per providers order.  Blood pressure elevated and will be rechecked prior to infusion.  Patients only complaint today is fatigue.  Peripheral IV started and blood return noted pre and post infusion.  Feraheme infusion given today per MD orders.  Stable during infusion without adverse affects.  Vital signs stable.  No complaints at this time.  Discharge from clinic via wheelchair in stable condition.  Alert and oriented X 3.  Follow up with [facility] as scheduled.

## 2021-06-29 NOTE — Patient Instructions (Signed)
[facility]  Discharge Instructions: °Thank you for choosing [facility] to provide your oncology and hematology care.  °If you have a lab appointment with the Cancer Center, please come in thru the Main Entrance and check in at the main information desk. ° °Wear comfortable clothing and clothing appropriate for easy access to any Portacath or PICC line.  ° °We strive to give you quality time with your provider. You may need to reschedule your appointment if you arrive late (15 or more minutes).  Arriving late affects you and other patients whose appointments are after yours.  Also, if you miss three or more appointments without notifying the office, you may be dismissed from the clinic at the provider’s discretion.    °  °For prescription refill requests, have your pharmacy contact our office and allow 72 hours for refills to be completed.   ° °Today you received the following chemotherapy and/or immunotherapy agents Feraheme    °  °To help prevent nausea and vomiting after your treatment, we encourage you to take your nausea medication as directed. ° °BELOW ARE SYMPTOMS THAT SHOULD BE REPORTED IMMEDIATELY: °*FEVER GREATER THAN 100.4 F (38 °C) OR HIGHER °*CHILLS OR SWEATING °*NAUSEA AND VOMITING THAT IS NOT CONTROLLED WITH YOUR NAUSEA MEDICATION °*UNUSUAL SHORTNESS OF BREATH °*UNUSUAL BRUISING OR BLEEDING °*URINARY PROBLEMS (pain or burning when urinating, or frequent urination) °*BOWEL PROBLEMS (unusual diarrhea, constipation, pain near the anus) °TENDERNESS IN MOUTH AND THROAT WITH OR WITHOUT PRESENCE OF ULCERS (sore throat, sores in mouth, or a toothache) °UNUSUAL RASH, SWELLING OR PAIN  °UNUSUAL VAGINAL DISCHARGE OR ITCHING  ° °Items with * indicate a potential emergency and should be followed up as soon as possible or go to the Emergency Department if any problems should occur. ° °Please show the CHEMOTHERAPY ALERT CARD or IMMUNOTHERAPY ALERT CARD at check-in to the Emergency  Department and triage nurse. ° °Should you have questions after your visit or need to cancel or reschedule your appointment, please contact [facility] [phone]  and follow the prompts.  Office hours are 8:00 a.m. to 4:30 p.m. Monday - Friday. Please note that voicemails left after 4:00 p.m. may not be returned until the following business day.  We are closed weekends and major holidays. You have access to a nurse at all times for urgent questions. Please call the main number to the clinic [phone] and follow the prompts. ° °For any non-urgent questions, you may also contact your provider using MyChart. We now offer e-Visits for anyone 18 and older to request care online for non-urgent symptoms. For details visit [email or web address]. °  °Also download the MyChart app! Go to the app store, search "MyChart", open the app, select [facility], and log in with your MyChart username and password. ° °Due to Covid, a mask is required upon entering the hospital/clinic. If you do not have a mask, one will be given to you upon arrival. For doctor visits, patients may have 1 support person aged 18 or older with them. For treatment visits, patients cannot have anyone with them due to current Covid guidelines and our immunocompromised population.  °

## 2021-06-29 NOTE — Progress Notes (Signed)
[facility] [address] [place] [address]   CLINIC:  Medical Oncology/Hematology  PROGRESS NOTE  Patient Care Team: Redmond School, MD as PCP - General (Internal Medicine) Croitoru, Dani Gobble, MD as PCP - Cardiology (Cardiology) Merita Norton, MD as Referring Physician (Specialist) Cassandria Anger, MD as Consulting Physician (Endocrinology) Gala Romney Cristopher Estimable, MD as Consulting Physician (Gastroenterology) Cira Rue, RN Nurse Navigator as Registered Nurse (Medical Oncology) Franchot Gallo, MD as Consulting Physician (Urology) Brien Mates, RN as Oncology Nurse Navigator (Oncology)  Oncological History: Evaluation of history of prostate cancer & new pulmonary nodules/ renal mass  Interval History:   Grant [name] 87 y.o. male who returns to clinic for supportive care and management of symptomatic anemia.  Previously imaging showed hypermetabolic large renal mass with extension into the right renal pelvis with associated hypermetabolic pulmonary nodules.  Findings concerning for metastatic malignancy.  He is scheduled for ultrasound-guided renal biopsy on 07/03/2021 and treatment planning with MD on 07/06/2021.  He continues to have progressive fatigue.  Endorses weight loss and is seen dietitian today.  Continues to have hematuria. Has worsening disorientation and confusion since fall many months ago. Has not had recent brain imaging. No melena or hematochezia.  Persistent bilateral lower extremity edema.  No fevers, chills, night sweats.  No chest pain or shortness of breath.  No cough.  No other urinary complaints.  No nausea, vomiting, abdominal pain, constipation, or diarrhea.   MEDICAL HISTORY:  Past Medical History:  Diagnosis Date   Anemia, unspecified    Arthritis    CAD (coronary artery disease)    Cancer of kidney ([place])    Diabetes mellitus    Erectile dysfunction    GERD (gastroesophageal reflux disease)    Gout 09/09/2013   last  flare 8'14 with gallbladder attack at [facility]   Hyperlipidemia    Hypertension    Lung nodule    Macular degeneration 09/09/2013   bilateral"vision is poor"   Pancreatitis 09/09/2013   8'14 -enzymes improved   Prostate cancer ([place])    Psoriasis 09/09/2013   elbows, knees   RBBB    Skin cancer    forehead   Tubular adenoma     SURGICAL HISTORY: Past Surgical History:  Procedure Laterality Date   CARDIAC CATHETERIZATION  02/19/2011   3 vessel CAD   CATARACT EXTRACTION W/PHACO Left 02/14/2016   Procedure: CATARACT EXTRACTION PHACO AND INTRAOCULAR LENS PLACEMENT ([place]);  Surgeon: Rutherford Guys, MD;  Location: [facility];  Service: Ophthalmology;  Laterality: Left;  CDE:6.96   CATARACT EXTRACTION W/PHACO Right 02/28/2016   Procedure: CATARACT EXTRACTION PHACO AND INTRAOCULAR LENS PLACEMENT ([facility]);  Surgeon: Rutherford Guys, MD;  Location: [facility];  Service: Ophthalmology;  Laterality: Right;  CDE:9.54   CHOLECYSTECTOMY N/A 10/27/2013   Procedure: LAPAROSCOPIC CHOLECYSTECTOMY WITH INTRAOPERATIVE CHOLANGIOGRAM WITH LIVER BIOPSY;  Surgeon: Pedro Earls, MD;  Location: [facility];  Service: General;  Laterality: N/A;   COLONOSCOPY  02/25/2012   Procedure: COLONOSCOPY;  Surgeon: Daneil Dolin, MD;  Location: [facility];  Service: Endoscopy;  Laterality: N/A;  7:30 AM polyps removed.   COLONOSCOPY N/A 02/11/2015   Dr.Rourk- multiple colonic polyps bx= tubular adenoma and an inflammatory polyp   CORONARY ARTERY BYPASS GRAFT  02-28-2011   LIMA to LAD,SVG to acute marginal branch of RCA & sequential SVG to OM1 & OM2.   ESOPHAGOGASTRODUODENOSCOPY N/A 05/10/2016   RMR: mild schatzki ring, medium sized hh, H.pylori gastritis   NM MYOCAR  PERF WALL MOTION  02/15/2011   High Risk   PILONIDAL CYST EXCISION     US ECHOCARDIOGRAPHY  11/09/2008   mildly impaired diastolic dysfunction - Grade I    SOCIAL HISTORY: Social History   Socioeconomic History   Marital status: Married    Spouse name: Grant [name]   Number of  children: 1   Years of education: Not on file   Highest education level: 10th grade  Occupational History    Comment: retired  Tobacco Use   Smoking status: Former    Packs/day: 0.50    Years: 25.00    Pack years: 12.50    Types: Cigarettes    Quit date: 02/25/1983    Years since quitting: 38.3   Smokeless tobacco: Never  Vaping Use   Vaping Use: Never used  Substance and Sexual Activity   Alcohol use: Yes    Comment: occasional   Drug use: No   Sexual activity: Not Currently  Other Topics Concern   Not on file  Social History Narrative   Lives with wife Grant [name]   Right handed   Caffeine: 1-2 drinks a day of coffee   Social Determinants of Health   Financial Resource Strain: Low Risk    Difficulty of Paying Living Expenses: Not hard at all  Food Insecurity: No Food Insecurity   Worried About Charity fundraiser in the Last Year: Never true   [place] in the Last Year: Never true  Transportation Needs: No Transportation Needs   Lack of Transportation (Medical): No   Lack of Transportation (Non-Medical): No  Physical Activity: Sufficiently Active   Days of Exercise per Week: 5 days   Minutes of Exercise per Session: 40 min  Stress: No Stress Concern Present   Feeling of Stress : Not at all  Social Connections: Socially Integrated   Frequency of Communication with Friends and Family: Three times a week   Frequency of Social Gatherings with Friends and Family: More than three times a week   Attends Religious Services: More than 4 times per year   Active Member of [employer] or Organizations: Yes   Attends Music therapist: More than 4 times per year   Marital Status: Married  Human resources officer Violence: Not At Risk   Fear of Current or Ex-Partner: No   Emotionally Abused: No   Physically Abused: No   Sexually Abused: No    FAMILY HISTORY: Family History  Problem Relation Age of Onset   Stroke Father    Leukemia Mother    Heart failure Brother     Pancreatic cancer Sister    Hypertension Sister    Breast cancer Sister    Heart failure Sister    Anesthesia problems Neg Hx    Hypotension Neg Hx    Malignant hyperthermia Neg Hx    Pseudochol deficiency Neg Hx    Colon cancer Neg Hx    Prostate cancer Neg Hx     ALLERGIES:  is allergic to cephalexin, other, and amiodarone.  MEDICATIONS:  Current Outpatient Medications  Medication Sig Dispense Refill   allopurinol (ZYLOPRIM) 100 MG tablet Take by mouth.     atorvastatin (LIPITOR) 40 MG tablet Take 40 mg by mouth every evening.      B-D UF III MINI PEN NEEDLES 31G X 5 MM MISC USE AS DIRECTED TO INJECT LANTUS INSULIN AT AT BEDTIME 100 each 1   betamethasone dipropionate (DIPROLENE) 0.05 % cream Apply 1 application topically daily as  needed (siriasis).   12   blood glucose meter kit and supplies KIT Dispense based on patient and insurance preference. Use up to 2 times daily as directed. (FOR ICD-10 E11.65) Talking blood glucose meter. 1 each 5   Blood Glucose Monitoring Suppl (EASYMAX V BLOOD GLUCOSE) w/Device KIT 1 each by Does not apply route 2 (two) times daily. E11.65 1 kit 0   Blood Glucose Monitoring Suppl (ONE TOUCH ULTRA 2) w/Device KIT 1 each by Other route 2 (two) times daily. 1 kit 0   carvedilol (COREG) 12.5 MG tablet Take 12.5 mg by mouth 2 (two) times daily with a meal.      EASYMAX TEST test strip USE TO TEST TWICE DAILY. 200 strip 2   famotidine (PEPCID) 40 MG tablet Take 40 mg by mouth 2 (two) times daily.     LANTUS SOLOSTAR 100 UNIT/ML Solostar Pen ADMINISTER 20 UNITS UNDER THE SKIN AT BEDTIME 15 mL 1   Misc Natural Products (TART CHERRY ADVANCED PO) Take 1,200 mg by mouth daily.      Multiple Vitamin (MULTIVITAMIN) tablet Take 1 tablet by mouth daily.      Multiple Vitamins-Minerals (PRESERVISION AREDS PO) Take 1 capsule by mouth 2 (two) times daily.      quinapril (ACCUPRIL) 20 MG tablet Take 20 mg by mouth every morning.      No current facility-administered  medications for this visit.    REVIEW OF SYSTEMS:   Review of Systems  Constitutional:  Positive for fatigue and unexpected weight change. Negative for appetite change.  HENT:   Negative for mouth sores, sore throat and trouble swallowing.   Respiratory:  Positive for shortness of breath. Negative for chest tightness.   Cardiovascular:  Negative for leg swelling.  Gastrointestinal:  Negative for abdominal pain, constipation, diarrhea, nausea and vomiting.  Genitourinary:  Positive for frequency and hematuria. Negative for bladder incontinence and dysuria.   Musculoskeletal:  Negative for flank pain and neck stiffness.  Skin:  Negative for itching, rash and wound.  Neurological:  Negative for dizziness, headaches, light-headedness and numbness.  Hematological:  Negative for adenopathy. Does not bruise/bleed easily.  Psychiatric/Behavioral:  Positive for confusion. Negative for depression and sleep disturbance. The patient is not nervous/anxious.     PHYSICAL EXAMINATION: ECOG PERFORMANCE STATUS: 2 - Symptomatic, <50% confined to bed  Vitals:   06/29/21 0928  BP: (!) 176/75  Pulse: 63  Resp: 19  Temp: 98.8 F (37.1 C)  SpO2: 100%    Filed Weights   06/29/21 0928  Weight: 173 lb 6.4 oz (78.7 kg)    Physical Exam Vitals reviewed.  Constitutional:      General: He is not in acute distress.    Appearance: He is well-developed. He is ill-appearing.     Comments: Fatigued appearing. Wheelchair. Accompanied.   HENT:     Head: Normocephalic and atraumatic.     Nose: Nose normal.     Mouth/Throat:     Pharynx: No oropharyngeal exudate.  Eyes:     General: No scleral icterus.    Conjunctiva/sclera: Conjunctivae normal.  Cardiovascular:     Rate and Rhythm: Normal rate and regular rhythm.     Pulses: Normal pulses.     Heart sounds: Normal heart sounds.  Pulmonary:     Effort: Pulmonary effort is normal. No respiratory distress.     Breath sounds: Normal breath sounds. No  wheezing.  Chest:  Breasts:    Right: No axillary adenopathy or supraclavicular adenopathy.  Left: No axillary adenopathy or supraclavicular adenopathy.  Abdominal:     General: Bowel sounds are normal. There is no distension.     Palpations: Abdomen is soft.     Tenderness: There is no abdominal tenderness. There is no right CVA tenderness, left CVA tenderness or guarding.  Musculoskeletal:        General: Normal range of motion.     Cervical back: Normal range of motion and neck supple.     Right lower leg: Edema (2+) present.     Left lower leg: Edema (2+) present.  Lymphadenopathy:     Cervical: No cervical adenopathy.     Right cervical: No superficial cervical adenopathy.    Left cervical: No superficial cervical adenopathy.     Upper Body:     Right upper body: No supraclavicular, axillary or pectoral adenopathy.     Left upper body: No supraclavicular, axillary or pectoral adenopathy.     Lower Body: No right inguinal adenopathy.  Skin:    General: Skin is warm and dry.  Neurological:     General: No focal deficit present.     Mental Status: He is disoriented.     Motor: Weakness present.  Psychiatric:        Mood and Affect: Mood normal.        Behavior: Behavior normal.     LABORATORY DATA:  I have reviewed the data as listed CBC Latest Ref Rng & Units 06/29/2021 06/26/2021 05/23/2021  WBC 4.0 - 10.5 K/uL 10.6(H) 9.2 7.8  Hemoglobin 13.0 - 17.0 g/dL 8.6(L) 7.2(L) 7.6(L)  Hematocrit 39.0 - 52.0 % 27.7(L) 23.5(L) 25.0(L)  Platelets 150 - 400 K/uL 336 331 314   CMP Latest Ref Rng & Units 05/15/2021 04/30/2021 03/28/2021  Glucose 70 - 99 mg/dL 147(H) 139(H) -  BUN 8 - 23 mg/dL 45(H) 33(H) 43(A)  Creatinine 0.61 - 1.24 mg/dL 1.74(H) 1.81(H) 1.8(A)  Sodium 135 - 145 mmol/L 135 132(L) 138  Potassium 3.5 - 5.1 mmol/L 4.5 4.8 5.2  Chloride 98 - 111 mmol/L 104 101 101  CO2 22 - 32 mmol/L '24 27 20  ' Calcium 8.9 - 10.3 mg/dL 8.7(L) 7.9(L) 8.7  Total Protein 6.5 - 8.1 g/dL  6.7 6.6 -  Total Bilirubin 0.3 - 1.2 mg/dL 0.7 0.7 -  Alkaline Phos 38 - 126 U/L 134(H) 123 151(A)  AST 15 - 41 U/L '29 24 14  ' ALT 0 - 44 U/L '31 26 14    ' RADIOGRAPHIC STUDIES: I have personally reviewed the radiological images as listed and agreed with the findings in the report. NM PET Image Initial (PI) Skull Base To Thigh  Result Date: 06/02/2021 CLINICAL DATA:  Initial treatment strategy for pulmonary nodules. EXAM: NUCLEAR MEDICINE PET SKULL BASE TO THIGH TECHNIQUE: 8.4 mCi F-18 FDG was injected intravenously. Full-ring PET imaging was performed from the skull base to thigh after the radiotracer. CT data was obtained and used for attenuation correction and anatomic localization. Fasting blood glucose: 99 mg/dl COMPARISON:  CT abdomen pelvis 05/01/2019 without contrast FINDINGS: Mediastinal blood pool activity: SUV max 2.2 Liver activity: SUV max NA NECK: No hypermetabolic lymph nodes in the neck. Incidental CT findings: none CHEST: Multiple round hypermetabolic pulmonary nodules of varying size. Example nodule in the medial RIGHT lower lobe measures 19 mm with SUV max equal 8.2. A similar LEFT lower lobe nodule cold last 22.4 cm (image 80) with SUV max equal 5.3. Example RIGHT upper lobe nodule measures 1.0 cm with SUV max equal  5.1 on image 44. There approximately 20 nodules within each lung. No hypermetabolic supraclavicular nodes. No hypermetabolic axillary nodes. Incidental CT findings: none initial ABDOMEN/PELVIS: There is hypermetabolic mass expanding the mid and lower pole of the RIGHT kidney with SUV max equal 15.9. The activity extends to the cortex and therefore is favored malignancy rather than urine accumulation. Hypermetabolic activity extends into the RIGHT renal pelvis. The tumor mass is difficult to measure on noncontrast exam (approximately 7 cm by 5 cm by 6.8 cm). Hypermetabolic LEFT periaortic lymph nodes which are intense with SUV max equal 14.7. Example node measures 19 mm image  108/4. No pelvic adenopathy.  No abnormal activity the prostate gland. Incidental CT findings: none SKELETON: No focal hypermetabolic activity to suggest skeletal metastasis. Incidental CT findings: none IMPRESSION: 1. Large hypermetabolic mass occupying the entirety of the mid and lower pole the RIGHT kidney. IF patient cannot receive IV contrast, a non contrast MRI of the abdomen may provide some characterization of the renal malignancy. Contrast MRI would be preferred. 2. Concern for local tumor extension into the RIGHT renal pelvis. 3. Intense hypermetabolic retroperitoneal periaortic metastatic lymphadenopathy. 4. Bilateral intensely hypermetabolic multifocal pulmonary metastasis. Electronically Signed   By: Suzy Bouchard M.D.   On: 06/02/2021 16:49    ASSESSMENT:  1.  Hypermetabolic Lung nodules & Renal Mass: - ER on 04/30/21 for flank pain and hematuria.  CT imaging showed multiple bilateral pulmonary nodules measuring up to 1.8 cm in the right lower lobe.  Mild right hydronephrosis.  High attenuating mass in right renal pelvis.  2 cm retroperitoneal lymph node at the level of the left renal vasculature.  This was followed up with PET scan on 06/01/2021 which showed large hypermetabolic mass occupying the entirety of the mid and lower pole of the right kidney.  Concern for local tumor extension into the right renal pelvis.  Additionally, hypermetabolic retroperitoneal/periaortic lymphadenopathy, and bilateral hypermetabolic multifocal pulmonary nodules.  Findings concerning for metastatic renal carcinoma.  Patient awaiting ultrasound-guided biopsy on 07/03/2021.  He is scheduled to see MD for treatment planning and results on 07/06/2021.  2.  Prostate cancer: Biopsy on 08/24/2019, PSA 9.0, 4 cores revealed adenocarcinoma, GS 4+3. - 28 EBRT treatments completed on 02/23/2020.  PSA continues to remain within normal limits and renal mass and pulmonary nodules unlikely to represent prostate cancer.   Monitor.  3.  Normocytic anemia-hemoglobin downtrending.  Etiology likely related to hematuria with chronic disease contributing.  Ferritin 490 question acute phase reactant.  Iron saturation decreased at 9%.  Recommend Feraheme today. Plan for transfusion if hemoglobin less than 8. Consider second dose of feraheme at next visit on 07/06/21.   4. Confusion- recommend MRI brain w wo contrast asap.   Disposition:  Feraheme today MRI brain  Rtc as scheduled  All questions were answered. The patient knows to call the clinic with any problems, questions or concerns.  I have spent a total of 30 minutes minutes of face-to-face and non-face-to-face time, preparing to see the patient, obtaining and/or reviewing separately obtained history, performing a medically appropriate examination, counseling and educating the patient, ordering medications/tests, documenting clinical information in the electronic health record,and care coordination.   Verlon Au, NP Hematology and [place] at [facility]

## 2021-06-29 NOTE — Progress Notes (Signed)
Nutrition Follow-up:  87 year old male with new pulmonary nodules and renal mass. He is scheduled for renal biopsy on 8/1. Patient receiving faraheme infusion today.   Past medical history includes prostate cancer s/p 28 EBRT completed 02/23/20, CAD, DM2, GERD, gout, HLD, HTN  Met with patient and wife in infusion. Patient reports feeling "very tired" lately. He reports eating a good breakfast, but not eating much for lunch and dinner. Patient unable to recall what he has been eating. Wife recalls grits, piece of buttered toast, a few strawberries, half orange this morning. He usually has Glucerna and a few peanut butter crackers. Last night for dinner patient had a few bites of baked chicken, white beans, piece of whole wheat bread. Patient had small bowl of ice cream before bed, says his favorite is [place].   Medications: lantus, MVI  Labs: Hgb 8.6  Anthropometrics: Weight 169 lb 9.6 oz on 7/25 decreased 10 lbs (5.5%) from 179 lb 3.2 oz on 7/5. This is significant for time frame  Height: 6" Weight: 78.7 kg UBW: 190 lb (Nov 21) BMI: 23.52   Estimated Energy Needs  Kcals: 2200-2400 Protein: 110-126 Fluid: 2.2 L  NUTRITION DIAGNOSIS: Unintentional weight loss related to decreased appetite as evidenced by dietary recall less than 75% of estimated energy needs, 21 lb (11%) decrease in usual body weight in 8 months which is concerning   INTERVENTION:  Discussed strategies for poor appetite, encouraged small meals and snacks throughout the day, setting "meal time" reminders Discussed ways to add calories, protein to foods High calorie, high protein snack ideas Educated on foods with protein, encouraged to have protein source with meals and snacks Continue drinking nutrition supplement daily, recommended switching to Ensure Plus/equivalent for added calories  Ensure coupons given Contact information given    MONITORING, EVALUATION, GOAL: Patient will tolerate increased calories  and protein to minimize weight loss   NEXT VISIT: To be scheduled pending treatment plan

## 2021-06-30 ENCOUNTER — Other Ambulatory Visit: Payer: Self-pay | Admitting: Radiology

## 2021-06-30 ENCOUNTER — Other Ambulatory Visit: Payer: Self-pay | Admitting: Student

## 2021-06-30 ENCOUNTER — Ambulatory Visit (HOSPITAL_COMMUNITY)
Admission: RE | Admit: 2021-06-30 | Discharge: 2021-06-30 | Disposition: A | Discharge status: Home / Self Care | Payer: PPO | Source: Ambulatory Visit | Attending: Nurse Practitioner | Admitting: Nurse Practitioner

## 2021-06-30 ENCOUNTER — Encounter (HOSPITAL_COMMUNITY): Payer: Self-pay

## 2021-06-30 DIAGNOSIS — R410 Disorientation, unspecified: Secondary | ICD-10-CM

## 2021-07-02 DIAGNOSIS — I129 Hypertensive chronic kidney disease with stage 1 through stage 4 chronic kidney disease, or unspecified chronic kidney disease: Secondary | ICD-10-CM | POA: Diagnosis not present

## 2021-07-02 DIAGNOSIS — N1831 Chronic kidney disease, stage 3a: Secondary | ICD-10-CM | POA: Diagnosis not present

## 2021-07-02 DIAGNOSIS — E1122 Type 2 diabetes mellitus with diabetic chronic kidney disease: Secondary | ICD-10-CM | POA: Diagnosis not present

## 2021-07-02 DIAGNOSIS — E782 Mixed hyperlipidemia: Secondary | ICD-10-CM | POA: Diagnosis not present

## 2021-07-03 ENCOUNTER — Ambulatory Visit (HOSPITAL_COMMUNITY)
Admission: RE | Admit: 2021-07-03 | Discharge: 2021-07-03 | Disposition: A | Discharge status: Home / Self Care | Payer: PPO | Source: Ambulatory Visit | Attending: Urology | Admitting: Urology

## 2021-07-03 ENCOUNTER — Encounter (HOSPITAL_COMMUNITY): Payer: Self-pay

## 2021-07-03 ENCOUNTER — Other Ambulatory Visit (HOSPITAL_COMMUNITY): Payer: Self-pay | Admitting: Urology

## 2021-07-03 ENCOUNTER — Other Ambulatory Visit: Payer: Self-pay

## 2021-07-03 DIAGNOSIS — Z87891 Personal history of nicotine dependence: Secondary | ICD-10-CM | POA: Insufficient documentation

## 2021-07-03 DIAGNOSIS — N289 Disorder of kidney and ureter, unspecified: Secondary | ICD-10-CM | POA: Diagnosis not present

## 2021-07-03 DIAGNOSIS — Z79899 Other long term (current) drug therapy: Secondary | ICD-10-CM | POA: Insufficient documentation

## 2021-07-03 DIAGNOSIS — C7802 Secondary malignant neoplasm of left lung: Secondary | ICD-10-CM

## 2021-07-03 DIAGNOSIS — C7801 Secondary malignant neoplasm of right lung: Secondary | ICD-10-CM | POA: Diagnosis not present

## 2021-07-03 DIAGNOSIS — D3001 Benign neoplasm of right kidney: Secondary | ICD-10-CM

## 2021-07-03 DIAGNOSIS — C641 Malignant neoplasm of right kidney, except renal pelvis: Secondary | ICD-10-CM | POA: Diagnosis not present

## 2021-07-03 DIAGNOSIS — R918 Other nonspecific abnormal finding of lung field: Secondary | ICD-10-CM | POA: Diagnosis not present

## 2021-07-03 DIAGNOSIS — C679 Malignant neoplasm of bladder, unspecified: Secondary | ICD-10-CM | POA: Insufficient documentation

## 2021-07-03 DIAGNOSIS — N2889 Other specified disorders of kidney and ureter: Secondary | ICD-10-CM | POA: Diagnosis not present

## 2021-07-03 DIAGNOSIS — Z794 Long term (current) use of insulin: Secondary | ICD-10-CM | POA: Insufficient documentation

## 2021-07-03 DIAGNOSIS — C778 Secondary and unspecified malignant neoplasm of lymph nodes of multiple regions: Secondary | ICD-10-CM | POA: Diagnosis not present

## 2021-07-03 LAB — CBC
HCT: 25.3 % — ABNORMAL LOW (ref 39.0–52.0)
Hemoglobin: 8 g/dL — ABNORMAL LOW (ref 13.0–17.0)
MCH: 27.9 pg (ref 26.0–34.0)
MCHC: 31.6 g/dL (ref 30.0–36.0)
MCV: 88.2 fL (ref 80.0–100.0)
Platelets: 330 10*3/uL (ref 150–400)
RBC: 2.87 MIL/uL — ABNORMAL LOW (ref 4.22–5.81)
RDW: 17.1 % — ABNORMAL HIGH (ref 11.5–15.5)
WBC: 12.1 10*3/uL — ABNORMAL HIGH (ref 4.0–10.5)
nRBC: 0 % (ref 0.0–0.2)

## 2021-07-03 LAB — GLUCOSE, CAPILLARY
Glucose-Capillary: 180 mg/dL — ABNORMAL HIGH (ref 70–99)
Glucose-Capillary: 35 mg/dL — CL (ref 70–99)
Glucose-Capillary: 113 mg/dL — ABNORMAL HIGH (ref 70–99)

## 2021-07-03 LAB — PROTIME-INR
INR: 1.4 — ABNORMAL HIGH (ref 0.8–1.2)
Prothrombin Time: 17 seconds — ABNORMAL HIGH (ref 11.4–15.2)

## 2021-07-03 MED ORDER — LIDOCAINE HCL 1 % IJ SOLN
INTRAMUSCULAR | Status: AC
  Filled 2021-07-03: qty 10

## 2021-07-03 MED ORDER — FENTANYL CITRATE (PF) 100 MCG/2ML IJ SOLN
INTRAMUSCULAR | Status: AC
  Filled 2021-07-03: qty 2

## 2021-07-03 MED ORDER — MIDAZOLAM HCL 2 MG/2ML IJ SOLN
INTRAMUSCULAR | Status: AC
  Filled 2021-07-03: qty 2

## 2021-07-03 MED ORDER — DEXTROSE 50 % IV SOLN
1.0000 | Freq: Once | INTRAVENOUS | Status: AC
  Administered 2021-07-03: 50 mL via INTRAVENOUS

## 2021-07-03 MED ORDER — SODIUM CHLORIDE 0.9 % IV SOLN: INTRAVENOUS | Status: DC

## 2021-07-03 MED ORDER — GELATIN ABSORBABLE 12-7 MM EX MISC
CUTANEOUS | Status: AC
  Filled 2021-07-03: qty 1

## 2021-07-03 MED ORDER — DEXTROSE 50 % IV SOLN
INTRAVENOUS | Status: AC
  Filled 2021-07-03: qty 50

## 2021-07-03 NOTE — Sedation Documentation (Signed)
No sedation to be given per Dr. Earleen Newport, lidocaine and monitoring only due to AMS

## 2021-07-03 NOTE — H&P (Signed)
Chief Complaint: Patient was seen in consultation today for right renal mass biopsy at the request of Winter,Christopher Marjory Lies  Referring Physician(s): Winter,Christopher Marjory Lies  Supervising Physician: Corrie Mckusick  Patient Status: [facility] - Out-pt  History of Present Illness: Grant [name] is a 87 y.o. male   Hx prostate cancer Symptomatic anemia; flank pain; hematuria  Imaging 04/30/21: CT IMPRESSION: 1. Multiple bilateral pulmonary nodules consistent with metastatic disease. 2. Mild right hydronephrosis. No stone identified in the right ureter or in the right kidney. High attenuating content within the right renal pelvis may represent blood product/clot or a urothelial neoplasm. Urology consult and further evaluation with dedicated multiphasic CT urography or direct visualization with scope is recommended. 3. Retroperitoneal adenopathy.  PET 06/01/21:  IMPRESSION: 1. Large hypermetabolic mass occupying the entirety of the mid and lower pole the RIGHT kidney. IF patient cannot receive IV contrast, a non contrast MRI of the abdomen may provide some characterization of the renal malignancy. Contrast MRI would be preferred. 2. Concern for local tumor extension into the RIGHT renal pelvis. 3. Intense hypermetabolic retroperitoneal periaortic metastatic lymphadenopathy. 4. Bilateral intensely hypermetabolic multifocal pulmonary metastasis.  Worsening fatigue; wt loss; confusion and fall at home Follows with Oncology Scheduled for treatment planning 07/06/21  Scheduled now for biopsy of right renal mass per Dr Delton Coombes    Past Medical History:  Diagnosis Date   Anemia, unspecified    Arthritis    CAD (coronary artery disease)    Cancer of kidney ([place])    Diabetes mellitus    Erectile dysfunction    GERD (gastroesophageal reflux disease)    Gout 09/09/2013   last flare 8'14 with gallbladder attack at [facility]   Hyperlipidemia    Hypertension    Lung nodule     Macular degeneration 09/09/2013   bilateral"vision is poor"   Pancreatitis 09/09/2013   8'14 -enzymes improved   Prostate cancer ([place])    Psoriasis 09/09/2013   elbows, knees   RBBB    Skin cancer    forehead   Tubular adenoma     Past Surgical History:  Procedure Laterality Date   CARDIAC CATHETERIZATION  02/19/2011   3 vessel CAD   CATARACT EXTRACTION W/PHACO Left 02/14/2016   Procedure: CATARACT EXTRACTION PHACO AND INTRAOCULAR LENS PLACEMENT ([place]);  Surgeon: Rutherford Guys, MD;  Location: [facility] ORS;  Service: Ophthalmology;  Laterality: Left;  CDE:6.96   CATARACT EXTRACTION W/PHACO Right 02/28/2016   Procedure: CATARACT EXTRACTION PHACO AND INTRAOCULAR LENS PLACEMENT ([facility]);  Surgeon: Rutherford Guys, MD;  Location: [facility] ORS;  Service: Ophthalmology;  Laterality: Right;  CDE:9.54   CHOLECYSTECTOMY N/A 10/27/2013   Procedure: LAPAROSCOPIC CHOLECYSTECTOMY WITH INTRAOPERATIVE CHOLANGIOGRAM WITH LIVER BIOPSY;  Surgeon: Pedro Earls, MD;  Location: [facility] ORS;  Service: General;  Laterality: N/A;   COLONOSCOPY  02/25/2012   Procedure: COLONOSCOPY;  Surgeon: Daneil Dolin, MD;  Location: [facility] ENDO SUITE;  Service: Endoscopy;  Laterality: N/A;  7:30 AM polyps removed.   COLONOSCOPY N/A 02/11/2015   Dr.Rourk- multiple colonic polyps bx= tubular adenoma and an inflammatory polyp   CORONARY ARTERY BYPASS GRAFT  02-28-2011   LIMA to LAD,SVG to acute marginal branch of RCA & sequential SVG to OM1 & OM2.   ESOPHAGOGASTRODUODENOSCOPY N/A 05/10/2016   RMR: mild schatzki ring, medium sized hh, H.pylori gastritis   NM MYOCAR PERF WALL MOTION  02/15/2011   High Risk   PILONIDAL CYST EXCISION     US ECHOCARDIOGRAPHY  11/09/2008   mildly impaired diastolic dysfunction -  Grade I    Allergies: Cephalexin and Amiodarone  Medications: Prior to Admission medications   Medication Sig Start Date End Date Taking? Authorizing Provider  allopurinol (ZYLOPRIM) 100 MG tablet Take 100 mg by mouth daily. 07/24/14  Yes  [provider]  atorvastatin (LIPITOR) 40 MG tablet Take 40 mg by mouth every evening.    Yes [provider]  betamethasone dipropionate (DIPROLENE) 0.05 % cream Apply 1 application topically daily as needed (psoriasis). 11/22/15  Yes [provider]  carvedilol (COREG) 12.5 MG tablet Take 12.5 mg by mouth 2 (two) times daily with a meal.    Yes [provider]  donepezil (ARICEPT) 5 MG tablet Take 5 mg by mouth at bedtime.   Yes [provider]  famotidine (PEPCID) 40 MG tablet Take 40 mg by mouth 2 (two) times daily. 04/28/20  Yes [provider]  LANTUS SOLOSTAR 100 UNIT/ML Solostar Pen ADMINISTER 20 UNITS UNDER THE SKIN AT BEDTIME Patient taking differently: Inject 20 Units into the skin at bedtime. 03/22/21  Yes Nida, Marella Chimes, MD  Misc Natural Products (TART CHERRY ADVANCED PO) Take 1,200 mg by mouth daily.    Yes [provider]  Multiple Vitamin (MULTIVITAMIN) tablet Take 1 tablet by mouth daily.    Yes [provider]  Multiple Vitamins-Minerals (PRESERVISION AREDS PO) Take 1 capsule by mouth 2 (two) times daily.    Yes [provider]  quinapril (ACCUPRIL) 20 MG tablet Take 20 mg by mouth every morning.    Yes [provider]  B-D UF III MINI PEN NEEDLES 31G X 5 MM MISC USE AS DIRECTED TO INJECT LANTUS INSULIN AT AT BEDTIME 05/15/21   Cassandria Anger, MD  blood glucose meter kit and supplies KIT Dispense based on patient and insurance preference. Use up to 2 times daily as directed. (FOR ICD-10 E11.65) Talking blood glucose meter. 07/07/18   Cassandria Anger, MD  Blood Glucose Monitoring Suppl (EASYMAX V BLOOD GLUCOSE) w/Device KIT 1 each by Does not apply route 2 (two) times daily. E11.65 06/14/21   Cassandria Anger, MD  Blood Glucose Monitoring Suppl (ONE TOUCH ULTRA 2) w/Device KIT 1 each by Other route 2 (two) times daily. 06/19/21   Brita Romp, NP  EASYMAX TEST test strip  USE TO TEST TWICE DAILY. 12/01/20   Cassandria Anger, MD     Family History  Problem Relation Age of Onset   Stroke Father    Leukemia Mother    Heart failure Brother    Pancreatic cancer Sister    Hypertension Sister    Breast cancer Sister    Heart failure Sister    Anesthesia problems Neg Hx    Hypotension Neg Hx    Malignant hyperthermia Neg Hx    Pseudochol deficiency Neg Hx    Colon cancer Neg Hx    Prostate cancer Neg Hx     Social History   Socioeconomic History   Marital status: Married    Spouse name: [name]   Number of children: 1   Years of education: Not on file   Highest education level: 10th grade  Occupational History    Comment: retired  Tobacco Use   Smoking status: Former    Packs/day: 0.50    Years: 25.00    Pack years: 12.50    Types: Cigarettes    Quit date: 02/25/1983    Years since quitting: 38.3   Smokeless tobacco: Never  Vaping Use   Vaping  Use: Never used  Substance and Sexual Activity   Alcohol use: Not Currently    Comment: occasional   Drug use: No   Sexual activity: Not Currently  Other Topics Concern   Not on file  Social History Narrative   Lives with wife [name]   Right handed   Caffeine: 1-2 drinks a day of coffee   Social Determinants of Health   Financial Resource Strain: Low Risk    Difficulty of Paying Living Expenses: Not hard at all  Food Insecurity: No Food Insecurity   Worried About Charity fundraiser in the Last Year: Never true   [place] in the Last Year: Never true  Transportation Needs: No Transportation Needs   Lack of Transportation (Medical): No   Lack of Transportation (Non-Medical): No  Physical Activity: Sufficiently Active   Days of Exercise per Week: 5 days   Minutes of Exercise per Session: 40 min  Stress: No Stress Concern Present   Feeling of Stress : Not at all  Social Connections: Socially Integrated   Frequency of Communication with Friends and Family: Three times a week    Frequency of Social Gatherings with Friends and Family: More than three times a week   Attends Religious Services: More than 4 times per year   Active Member of Clubs or Organizations: Yes   Attends Music therapist: More than 4 times per year   Marital Status: Married    Review of Systems: A 12 point ROS discussed and pertinent positives are indicated in the HPI above.  All other systems are negative.  Review of Systems  Constitutional:  Positive for activity change, appetite change, fatigue and unexpected weight change.  HENT:  Negative for trouble swallowing.   Eyes:  Negative for visual disturbance.  Respiratory:  Negative for cough, choking and shortness of breath.   Cardiovascular:  Negative for chest pain.  Gastrointestinal:  Negative for abdominal pain and blood in stool.  Musculoskeletal:  Positive for back pain.  Neurological:  Positive for weakness.  Psychiatric/Behavioral:  Positive for confusion. Negative for agitation.    Vital Signs: BP 139/62   Pulse 83   Temp 97.8 F (36.6 C) (Oral)   Resp 16   Ht 6' (1.829 m)   Wt 170 lb (77.1 kg)   SpO2 100%   BMI 23.06 kg/m   Physical Exam Vitals reviewed.  Constitutional:      Appearance: He is ill-appearing.  HENT:     Mouth/Throat:     Mouth: Mucous membranes are moist.  Cardiovascular:     Rate and Rhythm: Normal rate. Rhythm irregular.     Heart sounds: No murmur heard. Pulmonary:     Effort: Pulmonary effort is normal.     Breath sounds: Normal breath sounds.  Abdominal:     Palpations: Abdomen is soft.     Tenderness: There is no abdominal tenderness.  Musculoskeletal:        General: Normal range of motion.  Skin:    General: Skin is warm.  Neurological:     Mental Status: He is alert. Mental status is at baseline.  Psychiatric:     Comments: Will talk with wife regarding consent    Imaging: No results found.  Labs:  CBC: Recent Labs    05/23/21 1419 06/26/21 0858  06/29/21 0818 07/03/21 0630  WBC 7.8 9.2 10.6* 12.1*  HGB 7.6* 7.2* 8.6* 8.0*  HCT 25.0* 23.5* 27.7* 25.3*  PLT 314 331 336  330    COAGS: No results for input(s): INR, APTT in the last 8760 hours.  BMP: Recent Labs    03/28/21 0000 04/30/21 1718 05/15/21 1445  NA 138 132* 135  K 5.2 4.8 4.5  CL 101 101 104  CO2 '20 27 24  ' GLUCOSE  --  139* 147*  BUN 43* 33* 45*  CALCIUM 8.7 7.9* 8.7*  CREATININE 1.8* 1.81* 1.74*  GFRNONAA  --  36* 38*    LIVER FUNCTION TESTS: Recent Labs    03/28/21 0000 04/30/21 1718 05/15/21 1445  BILITOT  --  0.7 0.7  AST '14 24 29  ' ALT '14 26 31  ' ALKPHOS 151* 123 134*  PROT  --  6.6 6.7  ALBUMIN 3.6 2.8* 2.9*    TUMOR MARKERS: No results for input(s): AFPTM, CEA, CA199, CHROMGRNA in the last 8760 hours.  Assessment and Plan:  Hx prostate cancer New rt renal mass and pulmonary nodules Scheduled today for renal mass biopsy for tissue diagnosis Plan is for bx today and treatment planning with Oncology 07/06/21 Risks and benefits of right renal mass biopsy was discussed with the patient and wife at bedside including, but not limited to bleeding, infection, damage to adjacent structures or low yield requiring additional tests. All of the questions were answered and there is agreement to proceed. Consent signed by Bobette Mo (wife) and in chart.   Thank you for this interesting consult.  I greatly enjoyed meeting Grant [name] and look forward to participating in their care.  A copy of this report was sent to the requesting provider on this date.  Electronically Signed: Lavonia Drafts, PA-C 07/03/2021, 7:05 AM   I spent a total of  30 Minutes   in face to face in clinical consultation, greater than 50% of which was counseling/coordinating care for right renal mass biopsy

## 2021-07-03 NOTE — Progress Notes (Signed)
Pt more awake and aware after D50 given. Waiting for wife to come down to radiology to consent for procedure.

## 2021-07-03 NOTE — Procedures (Addendum)
Interventional Radiology Procedure Note  Procedure: CT guided biopsy of right renal mass, FDG avid on prior PET CT Complications: None EBL: None Recommendations: - Bedrest 2 hours.   - Routine wound care - Follow up pathology - Advance diet   Signed,  Corrie Mckusick, DO

## 2021-07-03 NOTE — Progress Notes (Signed)
Pt here for kidney biopsy. Very weak and confused. Barely able to get out of wheelchair. BS 35. Jannifer Franklin made aware, orders given for D50 1 am IV.

## 2021-07-03 NOTE — Sedation Documentation (Signed)
Report given to [facility], patient to short stay bed 2

## 2021-07-04 ENCOUNTER — Other Ambulatory Visit (HOSPITAL_COMMUNITY): Payer: Self-pay | Admitting: Hematology

## 2021-07-04 ENCOUNTER — Encounter (HOSPITAL_COMMUNITY): Payer: Self-pay

## 2021-07-04 MED ORDER — ALPRAZOLAM 0.25 MG PO TABS: 0.2500 mg | ORAL_TABLET | Freq: Two times a day (BID) | ORAL | 0 refills | Status: AC | PRN

## 2021-07-04 NOTE — Progress Notes (Signed)
Patients wife called asking was there anything that could be given for patients nerves.   Dr. Delton Coombes sent in xanax 0.25 mg to pharmacy and patients wife aware.

## 2021-07-06 ENCOUNTER — Ambulatory Visit (HOSPITAL_COMMUNITY): Payer: PPO | Admitting: Hematology

## 2021-07-06 ENCOUNTER — Ambulatory Visit (HOSPITAL_COMMUNITY): Payer: PPO

## 2021-07-06 ENCOUNTER — Inpatient Hospital Stay (HOSPITAL_COMMUNITY): Payer: PPO

## 2021-07-07 ENCOUNTER — Telehealth: Payer: Self-pay | Admitting: Nurse Practitioner

## 2021-07-07 LAB — SURGICAL PATHOLOGY

## 2021-07-07 NOTE — Telephone Encounter (Signed)
MRI brain cancelled d/t recent feraheme infusion. Spoke to Dr. Tasia Catchings who recommends waiting 10 days from [facility] to reschedule MRI. Spoke to patient's wife who is eager to have imaging done. Send scheduling message to have this rescheduled.

## 2021-07-10 ENCOUNTER — Ambulatory Visit (HOSPITAL_COMMUNITY)
Admission: RE | Admit: 2021-07-10 | Discharge: 2021-07-10 | Disposition: A | Discharge status: Home / Self Care | Payer: PPO | Source: Ambulatory Visit | Attending: Nurse Practitioner | Admitting: Nurse Practitioner

## 2021-07-10 ENCOUNTER — Other Ambulatory Visit (HOSPITAL_COMMUNITY): Payer: Self-pay

## 2021-07-10 ENCOUNTER — Other Ambulatory Visit: Payer: Self-pay

## 2021-07-10 ENCOUNTER — Encounter (HOSPITAL_COMMUNITY): Payer: Self-pay | Admitting: Nurse Practitioner

## 2021-07-10 ENCOUNTER — Inpatient Hospital Stay (HOSPITAL_COMMUNITY): Payer: PPO | Attending: Hematology

## 2021-07-10 ENCOUNTER — Other Ambulatory Visit: Payer: Self-pay | Admitting: Nurse Practitioner

## 2021-07-10 DIAGNOSIS — C7802 Secondary malignant neoplasm of left lung: Secondary | ICD-10-CM | POA: Diagnosis not present

## 2021-07-10 DIAGNOSIS — R410 Disorientation, unspecified: Secondary | ICD-10-CM | POA: Diagnosis not present

## 2021-07-10 DIAGNOSIS — N2889 Other specified disorders of kidney and ureter: Secondary | ICD-10-CM | POA: Insufficient documentation

## 2021-07-10 DIAGNOSIS — Z803 Family history of malignant neoplasm of breast: Secondary | ICD-10-CM | POA: Diagnosis not present

## 2021-07-10 DIAGNOSIS — Z87891 Personal history of nicotine dependence: Secondary | ICD-10-CM | POA: Diagnosis not present

## 2021-07-10 DIAGNOSIS — C779 Secondary and unspecified malignant neoplasm of lymph node, unspecified: Secondary | ICD-10-CM | POA: Diagnosis not present

## 2021-07-10 DIAGNOSIS — C61 Malignant neoplasm of prostate: Secondary | ICD-10-CM | POA: Diagnosis not present

## 2021-07-10 DIAGNOSIS — F419 Anxiety disorder, unspecified: Secondary | ICD-10-CM | POA: Insufficient documentation

## 2021-07-10 DIAGNOSIS — Z5112 Encounter for antineoplastic immunotherapy: Secondary | ICD-10-CM | POA: Diagnosis not present

## 2021-07-10 DIAGNOSIS — E119 Type 2 diabetes mellitus without complications: Secondary | ICD-10-CM | POA: Insufficient documentation

## 2021-07-10 DIAGNOSIS — Z79899 Other long term (current) drug therapy: Secondary | ICD-10-CM | POA: Insufficient documentation

## 2021-07-10 DIAGNOSIS — Z794 Long term (current) use of insulin: Secondary | ICD-10-CM | POA: Insufficient documentation

## 2021-07-10 DIAGNOSIS — R918 Other nonspecific abnormal finding of lung field: Secondary | ICD-10-CM

## 2021-07-10 DIAGNOSIS — D649 Anemia, unspecified: Secondary | ICD-10-CM | POA: Insufficient documentation

## 2021-07-10 DIAGNOSIS — R1310 Dysphagia, unspecified: Secondary | ICD-10-CM | POA: Insufficient documentation

## 2021-07-10 DIAGNOSIS — Z806 Family history of leukemia: Secondary | ICD-10-CM | POA: Insufficient documentation

## 2021-07-10 DIAGNOSIS — C7801 Secondary malignant neoplasm of right lung: Secondary | ICD-10-CM | POA: Diagnosis not present

## 2021-07-10 DIAGNOSIS — R4182 Altered mental status, unspecified: Secondary | ICD-10-CM | POA: Diagnosis not present

## 2021-07-10 LAB — COMPREHENSIVE METABOLIC PANEL
ALT: 36 U/L (ref 0–44)
AST: 37 U/L (ref 15–41)
Albumin: 2 g/dL — ABNORMAL LOW (ref 3.5–5.0)
Alkaline Phosphatase: 242 U/L — ABNORMAL HIGH (ref 38–126)
Anion gap: 6 (ref 5–15)
BUN: 46 mg/dL — ABNORMAL HIGH (ref 8–23)
CO2: 26 mmol/L (ref 22–32)
Calcium: 8.6 mg/dL — ABNORMAL LOW (ref 8.9–10.3)
Chloride: 106 mmol/L (ref 98–111)
Creatinine, Ser: 1.82 mg/dL — ABNORMAL HIGH (ref 0.61–1.24)
GFR, Estimated: 36 mL/min — ABNORMAL LOW (ref >60)
Glucose, Bld: 72 mg/dL (ref 70–99)
Potassium: 4.5 mmol/L (ref 3.5–5.1)
Sodium: 138 mmol/L (ref 135–145)
Total Bilirubin: 0.9 mg/dL (ref 0.3–1.2)
Total Protein: 6.3 g/dL — ABNORMAL LOW (ref 6.5–8.1)

## 2021-07-10 LAB — SAMPLE TO BLOOD BANK

## 2021-07-10 LAB — CBC WITH DIFFERENTIAL/PLATELET
Abs Immature Granulocytes: 0.04 10*3/uL (ref 0.00–0.07)
Basophils Absolute: 0 10*3/uL (ref 0.0–0.1)
Basophils Relative: 0 %
Eosinophils Absolute: 0.1 10*3/uL (ref 0.0–0.5)
Eosinophils Relative: 1 %
HCT: 26.5 % — ABNORMAL LOW (ref 39.0–52.0)
Hemoglobin: 8 g/dL — ABNORMAL LOW (ref 13.0–17.0)
Immature Granulocytes: 0 %
Lymphocytes Relative: 8 %
Lymphs Abs: 0.7 10*3/uL (ref 0.7–4.0)
MCH: 27.6 pg (ref 26.0–34.0)
MCHC: 30.2 g/dL (ref 30.0–36.0)
MCV: 91.4 fL (ref 80.0–100.0)
Monocytes Absolute: 0.6 10*3/uL (ref 0.1–1.0)
Monocytes Relative: 7 %
Neutro Abs: 7.6 10*3/uL (ref 1.7–7.7)
Neutrophils Relative %: 84 %
Platelets: 331 10*3/uL (ref 150–400)
RBC: 2.9 MIL/uL — ABNORMAL LOW (ref 4.22–5.81)
RDW: 17.9 % — ABNORMAL HIGH (ref 11.5–15.5)
WBC: 9 10*3/uL (ref 4.0–10.5)
nRBC: 0 % (ref 0.0–0.2)

## 2021-07-10 LAB — IRON AND TIBC
Iron: 16 ug/dL — ABNORMAL LOW (ref 45–182)
Saturation Ratios: 12 % — ABNORMAL LOW (ref 17.9–39.5)
TIBC: 135 ug/dL — ABNORMAL LOW (ref 250–450)
UIBC: 119 ug/dL

## 2021-07-10 LAB — FERRITIN: Ferritin: 1483 ng/mL — ABNORMAL HIGH (ref 24–336)

## 2021-07-10 LAB — LACTATE DEHYDROGENASE: LDH: 321 U/L — ABNORMAL HIGH (ref 98–192)

## 2021-07-10 MED ORDER — IOHEXOL 300 MG/ML  SOLN
50.0000 mL | Freq: Once | INTRAMUSCULAR | Status: AC | PRN
  Administered 2021-07-10: 50 mL via INTRAVENOUS

## 2021-07-10 NOTE — Progress Notes (Signed)
[address] [place], [place] [address]   CLINIC:  Medical Oncology/Hematology  PCP:  [facility], [place] / [place] [address] [phone]   REASON FOR VISIT:  Follow-up for prostate cancer & new pulmonary nodules  PRIOR THERAPY: none  NGS Results: not done  CURRENT THERAPY: Intermittent Feraheme (last on 06/29/21)  BRIEF ONCOLOGIC HISTORY:  Oncology History  Malignant neoplasm of prostate ([place])  08/18/2019 Cancer Staging   Staging form: Prostate, AJCC 8th Edition - Clinical stage from 08/18/2019: Stage IIC (cT1c, cN0, cM0, PSA: 9, Grade Group: 3) - Signed by Freeman Caldron, PA-C on 03/14/2020    11/17/2019 Initial Diagnosis   Malignant neoplasm of prostate [facility])      CANCER STAGING: Cancer Staging Malignant neoplasm of prostate [facility]) Staging form: Prostate, AJCC 8th Edition - Clinical stage from 08/18/2019: Stage IIC (cT1c, cN0, cM0, PSA: 9, Grade Group: 3) - Signed by Freeman Caldron, PA-C on 03/14/2020   INTERVAL HISTORY:  Grant [name], a 87 y.o. male, returns for routine follow-up of his prostate cancer & new pulmonary nodules. Grant [name] was last seen on 05/15/21.   Today he reports feeling well, and today he is accompanied by his granddaughter and wife. He reports occasional pain in his right side which is helped with 650 mg tylenol BID. He is drinking 1 Ensure daily along with 2 meals. He denies n/v/d. His wife reports that he is severely fatigued, and he felt no improvement following his Feraheme infusion; she also reports occasional SOB. He is currently walking with assistance of a walker. He has difficulty swallowing water and thin liquids; he is able to swallow Boost and solid foods without trouble. His granddaughter also reports he has difficulty sleeping as well as intermittent confusion. He was started on Aricept last year following a fall where he hit his head. He has a history of diabetes which is  currently being managed with Lantus.   REVIEW OF SYSTEMS:  Review of Systems  HENT:   Positive for trouble swallowing.   Respiratory:  Positive for cough and shortness of breath.   Gastrointestinal:  Negative for diarrhea, nausea and vomiting.  Genitourinary:  Positive for hematuria.   Psychiatric/Behavioral:  Positive for confusion, depression and sleep disturbance. The patient is nervous/anxious.   All other systems reviewed and are negative.  PAST MEDICAL/SURGICAL HISTORY:  Past Medical History:  Diagnosis Date   Anemia, unspecified    Arthritis    CAD (coronary artery disease)    Cancer of kidney ([place])    Diabetes mellitus    Erectile dysfunction    GERD (gastroesophageal reflux disease)    Gout 09/09/2013   last flare 8'14 with gallbladder attack at [facility]   Hyperlipidemia    Hypertension    Lung nodule    Macular degeneration 09/09/2013   bilateral"vision is poor"   Pancreatitis 09/09/2013   8'14 -enzymes improved   Prostate cancer ([place])    Psoriasis 09/09/2013   elbows, knees   RBBB    Skin cancer    forehead   Tubular adenoma    Past Surgical History:  Procedure Laterality Date   CARDIAC CATHETERIZATION  02/19/2011   3 vessel CAD   CATARACT EXTRACTION W/PHACO Left 02/14/2016   Procedure: CATARACT EXTRACTION PHACO AND INTRAOCULAR LENS PLACEMENT ([place]);  Surgeon: Rutherford Guys, MD;  Location: [facility];  Service: Ophthalmology;  Laterality: Left;  CDE:6.96   CATARACT EXTRACTION W/PHACO Right 02/28/2016   Procedure: CATARACT EXTRACTION PHACO  AND INTRAOCULAR LENS PLACEMENT ([facility]);  Surgeon: Rutherford Guys, MD;  Location: [facility] ORS;  Service: Ophthalmology;  Laterality: Right;  CDE:9.54   CHOLECYSTECTOMY N/A 10/27/2013   Procedure: LAPAROSCOPIC CHOLECYSTECTOMY WITH INTRAOPERATIVE CHOLANGIOGRAM WITH LIVER BIOPSY;  Surgeon: Pedro Earls, MD;  Location: [facility] ORS;  Service: General;  Laterality: N/A;   COLONOSCOPY  02/25/2012   Procedure: COLONOSCOPY;  Surgeon: Daneil Dolin, MD;   Location: [facility] END[facility] SUITE;  Service: Endoscopy;  Laterality: N/A;  7:30 AM polyps removed.   COLONOSCOPY N/A 02/11/2015   Dr.Rourk- multiple colonic polyps bx= tubular adenoma and an inflammatory polyp   CORONARY ARTERY BYPASS GRAFT  02-28-2011   LIMA to LAD,SVG to acute marginal branch of RCA & sequential SVG to OM1 & OM2.   ESOPHAGOGASTRODUODENOSCOPY N/A 05/10/2016   RMR: mild schatzki ring, medium sized hh, H.pylori gastritis   NM MYOCAR PERF WALL MOTION  02/15/2011   High Risk   PILONIDAL CYST EXCISION     US ECHOCARDIOGRAPHY  11/09/2008   mildly impaired diastolic dysfunction - Grade I    SOCIAL HISTORY:  Social History   Socioeconomic History   Marital status: Married    Spouse name: [name]   Number of children: 1   Years of education: Not on file   Highest education level: 10th grade  Occupational History    Comment: retired  Tobacco Use   Smoking status: Former    Packs/day: 0.50    Years: 25.00    Pack years: 12.50    Types: Cigarettes    Quit date: 02/25/1983    Years since quitting: 38.4   Smokeless tobacco: Never  Vaping Use   Vaping Use: Never used  Substance and Sexual Activity   Alcohol use: Not Currently    Comment: occasional   Drug use: No   Sexual activity: Not Currently  Other Topics Concern   Not on file  Social History Narrative   Lives with wife [name]   Right handed   Caffeine: 1-2 drinks a day of coffee   Social Determinants of Health   Financial Resource Strain: Low Risk    Difficulty of Paying Living Expenses: Not hard at all  Food Insecurity: No Food Insecurity   Worried About Charity fundraiser in the Last Year: Never true   Arboriculturist in the Last Year: Never true  Transportation Needs: No Transportation Needs   Lack of Transportation (Medical): No   Lack of Transportation (Non-Medical): No  Physical Activity: Sufficiently Active   Days of Exercise per Week: 5 days   Minutes of Exercise per Session: 40 min  Stress: No Stress  Concern Present   Feeling of Stress : Not at all  Social Connections: Socially Integrated   Frequency of Communication with Friends and Family: Three times a week   Frequency of Social Gatherings with Friends and Family: More than three times a week   Attends Religious Services: More than 4 times per year   Active Member of [employer] or Organizations: Yes   Attends Music therapist: More than 4 times per year   Marital Status: Married  Human resources officer Violence: Not At Risk   Fear of Current or Ex-Partner: No   Emotionally Abused: No   Physically Abused: No   Sexually Abused: No    FAMILY HISTORY:  Family History  Problem Relation Age of Onset   Stroke Father    Leukemia Mother    Heart failure Brother    Pancreatic cancer  Sister    Hypertension Sister    Breast cancer Sister    Heart failure Sister    Anesthesia problems Neg Hx    Hypotension Neg Hx    Malignant hyperthermia Neg Hx    Pseudochol deficiency Neg Hx    Colon cancer Neg Hx    Prostate cancer Neg Hx     CURRENT MEDICATIONS:  Current Outpatient Medications  Medication Sig Dispense Refill   allopurinol (ZYLOPRIM) 100 MG tablet Take 100 mg by mouth daily.     ALPRAZolam (XANAX) 0.25 MG tablet Take 1 tablet (0.25 mg total) by mouth 2 (two) times daily as needed for anxiety. 30 tablet 0   atorvastatin (LIPITOR) 40 MG tablet Take 40 mg by mouth every evening.      B-D UF III MINI PEN NEEDLES 31G X 5 MM MISC USE AS DIRECTED TO INJECT LANTUS INSULIN AT AT BEDTIME 100 each 1   betamethasone dipropionate (DIPROLENE) 0.05 % cream Apply 1 application topically daily as needed (psoriasis).  12   blood glucose meter kit and supplies KIT Dispense based on patient and insurance preference. Use up to 2 times daily as directed. (FOR ICD-10 E11.65) Talking blood glucose meter. 1 each 5   Blood Glucose Monitoring Suppl (EASYMAX V BLOOD GLUCOSE) w/Device KIT 1 each by Does not apply route 2 (two) times daily. E11.65 1 kit  0   Blood Glucose Monitoring Suppl (ONE TOUCH ULTRA 2) w/Device KIT 1 each by Other route 2 (two) times daily. 1 kit 0   carvedilol (COREG) 12.5 MG tablet Take 12.5 mg by mouth 2 (two) times daily with a meal.      donepezil (ARICEPT) 5 MG tablet Take 5 mg by mouth at bedtime.     EASYMAX TEST test strip USE TO TEST TWICE DAILY. 200 strip 2   LANTUS SOLOSTAR 100 UNIT/ML Solostar Pen ADMINISTER 20 UNITS UNDER THE SKIN AT BEDTIME (Patient taking differently: Inject 20 Units into the skin at bedtime.) 15 mL 1   Misc Natural Products (TART CHERRY ADVANCED PO) Take 1,200 mg by mouth daily.      Multiple Vitamin (MULTIVITAMIN) tablet Take 1 tablet by mouth daily.      Multiple Vitamins-Minerals (PRESERVISION AREDS PO) Take 1 capsule by mouth 2 (two) times daily.      quinapril (ACCUPRIL) 20 MG tablet Take 20 mg by mouth every morning.      No current facility-administered medications for this visit.    ALLERGIES:  Allergies  Allergen Reactions   Cephalexin Nausea And Vomiting   Amiodarone Nausea Only    PHYSICAL EXAM:  Performance status (ECOG): 1 - Symptomatic but completely ambulatory  There were no vitals filed for this visit. Wt Readings from Last 3 Encounters:  07/03/21 170 lb (77.1 kg)  06/29/21 173 lb 6.4 oz (78.7 kg)  06/26/21 169 lb 9.6 oz (76.9 kg)   Physical Exam Vitals reviewed.  Constitutional:      Appearance: Normal appearance.  Cardiovascular:     Rate and Rhythm: Normal rate and regular rhythm.     Pulses: Normal pulses.     Heart sounds: Normal heart sounds.  Pulmonary:     Effort: Pulmonary effort is normal.     Breath sounds: Normal breath sounds.  Neurological:     General: No focal deficit present.     Mental Status: He is alert and oriented to person, place, and time.  Psychiatric:        Mood and Affect: Mood  normal.        Behavior: Behavior normal.     LABORATORY DATA:  I have reviewed the labs as listed.  CBC Latest Ref Rng & Units 07/10/2021  07/03/2021 06/29/2021  WBC 4.0 - 10.5 K/uL 9.0 12.1(H) 10.6(H)  Hemoglobin 13.0 - 17.0 g/dL 8.0(L) 8.0(L) 8.6(L)  Hematocrit 39.0 - 52.0 % 26.5(L) 25.3(L) 27.7(L)  Platelets 150 - 400 K/uL 331 330 336   CMP Latest Ref Rng & Units 07/10/2021 05/15/2021 04/30/2021  Glucose 70 - 99 mg/dL 72 147(H) 139(H)  BUN 8 - 23 mg/dL 46(H) 45(H) 33(H)  Creatinine 0.61 - 1.24 mg/dL 1.82(H) 1.74(H) 1.81(H)  Sodium 135 - 145 mmol/L 138 135 132(L)  Potassium 3.5 - 5.1 mmol/L 4.5 4.5 4.8  Chloride 98 - 111 mmol/L 106 104 101  CO2 22 - 32 mmol/L _0 Calcium 8.9 - 10.3 mg/dL 8.6(L) 8.7(L) 7.9(L)  Total Protein 6.5 - 8.1 g/dL 6.3(L) 6.7 6.6  Total Bilirubin 0.3 - 1.2 mg/dL 0.9 0.7 0.7  Alkaline Phos 38 - 126 U/L 242(H) 134(H) 123  AST 15 - 41 U/L 37 29 24  ALT 0 - 44 U/L 36 31 26    DIAGNOSTIC IMAGING:  I have independently reviewed the scans and discussed with the patient. CT HEAD W & WO CONTRAST (5MM)  Result Date: 07/10/2021 CLINICAL DATA:  Mental status change, persistent or worsening; right renal mass EXAM: CT HEAD WITHOUT AND WITH CONTRAST TECHNIQUE: Contiguous axial images were obtained from the base of the skull through the vertex without and with intravenous contrast CONTRAST:  36m OMNIPAQUE IOHEXOL 300 MG/ML  SOLN COMPARISON:  Noncontrast CT head January 2022 FINDINGS: Brain: There is no acute intracranial hemorrhage, mass, mass effect, or edema. No abnormal enhancement. Gray-white differentiation is preserved. There is no extra-axial fluid collection. Stable prominence of ventricles and sulci reflecting parenchymal volume loss. Patchy areas of low-attenuation in the supratentorial white matter probably reflect stable chronic microvascular ischemic changes. Vascular: There is atherosclerotic calcification at the skull base. Skull: Calvarium is unremarkable. Sinuses/Orbits: Mild polypoid mucosal thickening. Orbits are unremarkable. Other: A polyp is again noted within the nasal cavity and nasopharynx.  Mastoid air cells are clear. IMPRESSION: No evidence of intracranial metastatic disease or other acute abnormality. Electronically Signed   By: PMacy MisM.D.   On: 07/10/2021 12:59   CT RENAL BIOPSY  Result Date: 07/03/2021 INDICATION: 87year old male with FDG avid lesion of the right kidney, potentially primary kidney lesion versus lymphoma. EXAM: CT-GUIDED BIOPSY OF RIGHT LIVER MASS MEDICATIONS: None. ANESTHESIA/SEDATION: No moderate sedation Moderate Sedation Time: 0 minutes. The patient's level of consciousness and vital signs were monitored continuously by radiology nursing throughout the procedure under my direct supervision. FLUOROSCOPY TIME:  CT COMPLICATIONS: None PROCEDURE: Informed written consent was obtained from the patient after a thorough discussion of the procedural risks, benefits and alternatives. All questions were addressed. Maximal Sterile Barrier Technique was utilized including caps, mask, sterile gowns, sterile gloves, sterile drape, hand hygiene and skin antiseptic. A timeout was performed prior to the initiation of the procedure. Patient position prone position on the CT gantry table. Scout CT were acquired for planning purposes. Using CT guidance, 17 gauge introducer needle was advanced into the right kidney, targeting the region of FDG activity on the prior PET-CT. Once we confirmed needle tip position, multiple 18 gauge core biopsy were performed. Samples were placed into both formalin and tip fresh specimen. Gel-Foam pledgets were placed with saline and then the needle  was removed. Final image was stored. Patient tolerated the procedure well and remained hemodynamically stable throughout. No complications were encountered and no significant blood loss. IMPRESSION: Status post CT-guided biopsy of right renal lesion. Signed, Dulcy Fanny. Dellia Nims, RPVI [facility] Radiology Electronically Signed   By: Corrie Mckusick D.O.   On:  07/03/2021 10:11     ASSESSMENT:  1.  Metastatic high-grade urothelial carcinoma: - Presented to the ER on 04/30/2021 with flank pain and hematuria. - CTAP on 04/30/2021 showed multiple bilateral lung nodules measuring up to 18 mm in the right lower lobe.  Mild right hydronephrosis.  High attenuating content in the right renal pelvis which may represent blood product or urothelial neoplasm.  2 cm retroperitoneal lymph node at the level of the left renal vasculature. - Reports 10 pound weight loss in the last 3 to 6 months.  He did not have any hematuria after treatment for UTI during the ER visit. - PET scan on 06/01/2021 with large hypermetabolic mass occupying the mid and lower pole of the right kidney with concern for tumor extension into the right renal pelvis.  Intense hypermetabolic retroperitoneal, periaortic metastatic adenopathy.  Bilateral hypermetabolic multifocal lung metastasis. - Right kidney biopsy on 07/03/2021 with high-grade urothelial carcinoma. - CT head with and without contrast on 07/11/2019 was negative for metastatic disease.  2.  Prostate cancer: - Biopsy on 08/24/2019, PSA 9.0, 4 cores revealed adenocarcinoma, GS 4+3. - 28 EBRT treatments completed on 02/23/2020.  3.  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 - He quit smoking in 1984.  Smoked less than 1 pack/day for 20 years. - Mother died of acute leukemia.  2 sisters had breast cancer.   PLAN:  1.  Metastatic urothelial carcinoma to the lungs and lymph nodes: - We talked about biopsy results with the patient's wife and his granddaughter. - We talked about active therapy versus best supportive care. - Based on his functional status, he is not a candidate for platinum based chemotherapy as first-line. - We talked about next best option of immunotherapy with pembrolizumab without regards to PD-L1 status.  He is interested in this option. - We discussed side effects including  reactivation of autoimmune problems.  He had remote history of psoriasis which is fully under control.  Otherwise no autoimmune problems. - We discussed results of the CT scan of the head which was negative for metastatic disease. - We also discussed need for NGS testing to open up further treatment options.  He is agreeable.  We will send for Caris NGS testing. - He has elevated creatinine and BUN.  He will receive 100 mL of normal saline today. - He will proceed with first cycle of Keytruda today. - He will benefit from home health because of his poor functional status.  We will make referral for home health evaluation.  I have recommended increasing activity with walker. - He occasionally gets right loin pain.  He takes Tylenol 650 mg twice daily which is helping. - RTC 3 weeks for follow-up.  2.  Normocytic anemia: - Normocytic anemia most likely from anemia from inflammation and relative iron deficiency. - He received 1 infusion of Feraheme on 06/29/2021.  Today ferritin is 1483 and percent saturation of 12.  Hemoglobin is stable at 8. - We will closely monitor.  Will transfuse as needed.  3.  Prostate cancer: - Last PSA on 05/16/2019 was 0.69.  4.  Swallowing difficulty: - Patient's wife reports that he has been choking on water. - We will recommend swallow evaluation.  5.  Diabetes: - Continue Lantus 20 units at bedtime.  6.  Anxiety: - Continue Xanax 0.25 mg twice daily as needed which is helping.  7.  Nutrition: - He eats good breakfast and lunch.  He drinks about 1 Ensure per day.  8.  Psoriasis: - He has history of psoriasis which is fully under control.  No recent flares. - He has betamethasone cream to be used as needed.  We will pay close attention as we are starting him on immunotherapy.   Orders placed this encounter:  No orders of the defined types were placed in this encounter.    Derek Jack, MD [facility] [phone]   I, Thana Ates, am acting as a scribe for Dr. Derek Jack.  I, Derek Jack MD, have reviewed the above documentation for accuracy and completeness, and I agree with the above.

## 2021-07-10 NOTE — Progress Notes (Signed)
Spoke with radiology. They are concerned artifact d/t recent feraheme will limit clinical utility of MRI brain. Recommend ct w wo contrast first. Order entered.

## 2021-07-11 ENCOUNTER — Inpatient Hospital Stay (HOSPITAL_COMMUNITY): Payer: PPO | Admitting: Hematology

## 2021-07-11 ENCOUNTER — Inpatient Hospital Stay (HOSPITAL_COMMUNITY): Payer: PPO

## 2021-07-11 ENCOUNTER — Telehealth (HOSPITAL_COMMUNITY): Payer: Self-pay | Admitting: Surgery

## 2021-07-11 ENCOUNTER — Other Ambulatory Visit (HOSPITAL_COMMUNITY): Payer: Self-pay | Admitting: Surgery

## 2021-07-11 ENCOUNTER — Encounter (HOSPITAL_COMMUNITY): Payer: Self-pay

## 2021-07-11 VITALS — BP 120/60 | HR 61 | Temp 97.7°F | Resp 18 | Wt 165.0 lb

## 2021-07-11 DIAGNOSIS — R918 Other nonspecific abnormal finding of lung field: Secondary | ICD-10-CM | POA: Diagnosis not present

## 2021-07-11 DIAGNOSIS — C61 Malignant neoplasm of prostate: Secondary | ICD-10-CM | POA: Diagnosis not present

## 2021-07-11 DIAGNOSIS — Z5112 Encounter for antineoplastic immunotherapy: Secondary | ICD-10-CM | POA: Diagnosis not present

## 2021-07-11 DIAGNOSIS — C689 Malignant neoplasm of urinary organ, unspecified: Secondary | ICD-10-CM

## 2021-07-11 MED ORDER — SODIUM CHLORIDE 0.9 % IV SOLN: Freq: Once | INTRAVENOUS | Status: AC

## 2021-07-11 MED ORDER — SODIUM CHLORIDE 0.9 % IV SOLN
200.0000 mg | Freq: Once | INTRAVENOUS | Status: AC
  Administered 2021-07-11: 200 mg via INTRAVENOUS
  Filled 2021-07-11: qty 8

## 2021-07-11 NOTE — Progress Notes (Signed)
START ON PATHWAY REGIMEN - Bladder     A cycle is every 21 days:     Pembrolizumab   **Always confirm dose/schedule in your pharmacy ordering system**  Patient Characteristics: Advanced/Metastatic Disease, First Line, No Prior Platinum-Based Therapy, Poor Renal Function (CrCl < 50 mL/min), Unknown PD-L1 Expression Therapeutic Status: Advanced/Metastatic Disease Line of Therapy: First Line Prior Platinum-Based Therapy<= No Renal Function: Poor Renal Function (CrCl < 50 mL/min) PD-L1 Expression Status: Unknown PD-L1 Expression Intent of Therapy: Non-Curative / Palliative Intent, Discussed with Patient 

## 2021-07-11 NOTE — Patient Instructions (Addendum)
[place] at [facility] Discharge Instructions  You were seen today by Dr. Delton Coombes. He went over your recent results and scans. Stay hydrated by drinking plenty of water daily (2-3 liters). You will be scheduled for a swallow test and a Port-A-Cath placement. You will begin treatment as soon as possible pending approval from your insurance. Dr. Tomie China PA will see you in 2 weeks for follow-up after your first treatment. Dr. Delton Coombes will see you back in 3 weeks with labs and follow up.   Thank you for choosing [place] at [facility] to provide your oncology and hematology care.  To afford each patient quality time with our provider, please arrive at least 15 minutes before your scheduled appointment time.   If you have a lab appointment with the [facility] please come in thru the Main Entrance and check in at the main information desk  You need to re-schedule your appointment should you arrive 10 or more minutes late.  We strive to give you quality time with our providers, and arriving late affects you and other patients whose appointments are after yours.  Also, if you no show three or more times for appointments you may be dismissed from the clinic at the providers discretion.     Again, thank you for choosing [facility].  Our hope is that these requests will decrease the amount of time that you wait before being seen by our physicians.       _____________________________________________________________  Should you have questions after your visit to [facility], please contact our office at [phone] between the hours of 8:00 a.m. and 4:30 p.m.  Voicemails left after 4:00 p.m. will not be returned until the following business day.  For prescription refill requests, have your pharmacy contact our office and allow 72 hours.    Cancer Center Support Programs:   > Cancer Support Group  2nd Tuesday of the  month 1pm-2pm, Journey Room

## 2021-07-11 NOTE — Progress Notes (Signed)
Ok to proceed with Beryle Flock today with abnormal labs from 07/10/21  No Feraheme today  PA is approved per financial team.  Will get TSH with next dose.  T.O Dr Rhys Martini, PharmD

## 2021-07-11 NOTE — Telephone Encounter (Signed)
Per Dr. Delton Coombes, an order was placed for a home health evaluation.  The family had requested [facility], since the pt's grandson's wife used to work there.  However, after speaking to [place], the pt would have to contact the Department of Social Services and be assigned a case worker before home health could be initiated.  I notified the family of the process that is needed for [facility], and they decided that they wanted local home health agencies to be contacted.  I called [facility] to see if the pt could be accepted with their agency, and per Romualdo Bolk, she is waiting for Dr. Tomie China note in the chart describing the need for home health.  Once Vaughan Basta has that note, she feels like the pt can be accepted with [facility].  I told Vaughan Basta that the grandson's wife Davidson Saleh wanted to be called when the pt gets accepted.

## 2021-07-11 NOTE — Progress Notes (Signed)
Caris testing ordered on [id number] per Dr. Delton Coombes

## 2021-07-11 NOTE — Progress Notes (Signed)
Immunotherapy education packet given and discussed with pt and family.  Reviewed immunotherapy medications and side effects.  Instructed on how to manage side effects at home, and when to call the clinic.  Importance of fever/chills discussed with pt and family.  Phone numbers provided for clinic during regular working hours, also how to reach the clinic after hours and on weekends. Pt provided the opportunity to ask questions - all questions answered to pt's satisfaction.

## 2021-07-11 NOTE — Progress Notes (Signed)
Patient present's today for treatment. Orders received from Dr. Delton Coombes to give 573m of NS and to start KPecan Gaptoday. Patient received chemo teaching and signed consent with AAnastasio Champion RN.   Patient tolerated Keytruda an 500 mL of NS with no complaints voiced. Side effects with management reviewed with understanding verbalized. IV site clean and dry with no bruising or swelling noted at site. Good blood return noted before and after administration of therapy. Band aid applied. Patient left in satisfactory condition via wheelchair with wife, with VSS and no s/s of distress noted.

## 2021-07-11 NOTE — Patient Instructions (Signed)
[facility]  Discharge Instructions: Thank you for choosing [facility] to provide your oncology and hematology care.  If you have a lab appointment with the Cancer Center, please come in thru the Main Entrance and check in at the main information desk.  Wear comfortable clothing and clothing appropriate for easy access to any Portacath or PICC line.   We strive to give you quality time with your provider. You may need to reschedule your appointment if you arrive late (15 or more minutes).  Arriving late affects you and other patients whose appointments are after yours.  Also, if you miss three or more appointments without notifying the office, you may be dismissed from the clinic at the provider's discretion.      For prescription refill requests, have your pharmacy contact our office and allow 72 hours for refills to be completed.    Today you received the following chemotherapy and/or immunotherapy agents; Keytruda. Return as scheduled.   To help prevent nausea and vomiting after your treatment, we encourage you to take your nausea medication as directed.  BELOW ARE SYMPTOMS THAT SHOULD BE REPORTED IMMEDIATELY: *FEVER GREATER THAN 100.4 F (38 C) OR HIGHER *CHILLS OR SWEATING *NAUSEA AND VOMITING THAT IS NOT CONTROLLED WITH YOUR NAUSEA MEDICATION *UNUSUAL SHORTNESS OF BREATH *UNUSUAL BRUISING OR BLEEDING *URINARY PROBLEMS (pain or burning when urinating, or frequent urination) *BOWEL PROBLEMS (unusual diarrhea, constipation, pain near the anus) TENDERNESS IN MOUTH AND THROAT WITH OR WITHOUT PRESENCE OF ULCERS (sore throat, sores in mouth, or a toothache) UNUSUAL RASH, SWELLING OR PAIN  UNUSUAL VAGINAL DISCHARGE OR ITCHING   Items with * indicate a potential emergency and should be followed up as soon as possible or go to the Emergency Department if any problems should occur.  Please show the CHEMOTHERAPY ALERT CARD or IMMUNOTHERAPY ALERT CARD at check-in to  the Emergency Department and triage nurse.  Should you have questions after your visit or need to cancel or reschedule your appointment, please contact [facility] [phone]  and follow the prompts.  Office hours are 8:00 a.m. to 4:30 p.m. Monday - Friday. Please note that voicemails left after 4:00 p.m. may not be returned until the following business day.  We are closed weekends and major holidays. You have access to a nurse at all times for urgent questions. Please call the main number to the clinic [phone] and follow the prompts.  For any non-urgent questions, you may also contact your provider using MyChart. We now offer e-Visits for anyone 18 and older to request care online for non-urgent symptoms. For details visit [email or web address].   Also download the MyChart app! Go to the app store, search "MyChart", open the app, select [facility], and log in with your MyChart username and password.  Due to Covid, a mask is required upon entering the hospital/clinic. If you do not have a mask, one will be given to you upon arrival. For doctor visits, patients may have 1 support person aged 18 or older with them. For treatment visits, patients cannot have anyone with them due to current Covid guidelines and our immunocompromised population.  

## 2021-07-12 ENCOUNTER — Encounter (HOSPITAL_COMMUNITY): Payer: Self-pay

## 2021-07-12 ENCOUNTER — Other Ambulatory Visit (HOSPITAL_COMMUNITY): Payer: Self-pay | Admitting: *Deleted

## 2021-07-12 MED ORDER — DRONABINOL 2.5 MG PO CAPS: 2.5000 mg | ORAL_CAPSULE | Freq: Two times a day (BID) | ORAL | 3 refills | Status: AC

## 2021-07-12 MED ORDER — DRONABINOL 2.5 MG PO CAPS: 2.5000 mg | ORAL_CAPSULE | Freq: Two times a day (BID) | ORAL | 3 refills | Status: DC

## 2021-07-12 MED ORDER — DRONABINOL 2.5 MG PO CAPS
2.5000 mg | ORAL_CAPSULE | Freq: Two times a day (BID) | ORAL | 3 refills | Status: DC
Start: 2021-07-12 — End: 2021-07-12

## 2021-07-12 NOTE — Progress Notes (Signed)
Romualdo Bolk, RN with [facility] called stating that they are going to accept patient for physical therapy and nursing aide.

## 2021-07-14 ENCOUNTER — Emergency Department (HOSPITAL_COMMUNITY): Payer: PPO

## 2021-07-14 ENCOUNTER — Emergency Department (HOSPITAL_COMMUNITY)
Admission: EM | Admit: 2021-07-14 | Discharge: 2021-07-14 | Disposition: A | Discharge status: Home / Self Care | Payer: PPO | Attending: Emergency Medicine | Admitting: Emergency Medicine

## 2021-07-14 DIAGNOSIS — Z85828 Personal history of other malignant neoplasm of skin: Secondary | ICD-10-CM | POA: Insufficient documentation

## 2021-07-14 DIAGNOSIS — C779 Secondary and unspecified malignant neoplasm of lymph node, unspecified: Secondary | ICD-10-CM | POA: Diagnosis not present

## 2021-07-14 DIAGNOSIS — Z951 Presence of aortocoronary bypass graft: Secondary | ICD-10-CM | POA: Insufficient documentation

## 2021-07-14 DIAGNOSIS — R059 Cough, unspecified: Secondary | ICD-10-CM | POA: Diagnosis not present

## 2021-07-14 DIAGNOSIS — I10 Essential (primary) hypertension: Secondary | ICD-10-CM | POA: Diagnosis not present

## 2021-07-14 DIAGNOSIS — E1122 Type 2 diabetes mellitus with diabetic chronic kidney disease: Secondary | ICD-10-CM | POA: Diagnosis not present

## 2021-07-14 DIAGNOSIS — T17220A Food in pharynx causing asphyxiation, initial encounter: Secondary | ICD-10-CM | POA: Insufficient documentation

## 2021-07-14 DIAGNOSIS — R531 Weakness: Secondary | ICD-10-CM | POA: Diagnosis not present

## 2021-07-14 DIAGNOSIS — F419 Anxiety disorder, unspecified: Secondary | ICD-10-CM | POA: Diagnosis not present

## 2021-07-14 DIAGNOSIS — Z8546 Personal history of malignant neoplasm of prostate: Secondary | ICD-10-CM | POA: Insufficient documentation

## 2021-07-14 DIAGNOSIS — Z8551 Personal history of malignant neoplasm of bladder: Secondary | ICD-10-CM | POA: Insufficient documentation

## 2021-07-14 DIAGNOSIS — D630 Anemia in neoplastic disease: Secondary | ICD-10-CM | POA: Diagnosis not present

## 2021-07-14 DIAGNOSIS — R1310 Dysphagia, unspecified: Secondary | ICD-10-CM | POA: Diagnosis not present

## 2021-07-14 DIAGNOSIS — L409 Psoriasis, unspecified: Secondary | ICD-10-CM | POA: Diagnosis not present

## 2021-07-14 DIAGNOSIS — Z85118 Personal history of other malignant neoplasm of bronchus and lung: Secondary | ICD-10-CM | POA: Diagnosis not present

## 2021-07-14 DIAGNOSIS — C61 Malignant neoplasm of prostate: Secondary | ICD-10-CM | POA: Diagnosis not present

## 2021-07-14 DIAGNOSIS — I129 Hypertensive chronic kidney disease with stage 1 through stage 4 chronic kidney disease, or unspecified chronic kidney disease: Secondary | ICD-10-CM | POA: Insufficient documentation

## 2021-07-14 DIAGNOSIS — T17320A Food in larynx causing asphyxiation, initial encounter: Secondary | ICD-10-CM | POA: Diagnosis not present

## 2021-07-14 DIAGNOSIS — D649 Anemia, unspecified: Secondary | ICD-10-CM | POA: Diagnosis not present

## 2021-07-14 DIAGNOSIS — Z794 Long term (current) use of insulin: Secondary | ICD-10-CM | POA: Insufficient documentation

## 2021-07-14 DIAGNOSIS — J90 Pleural effusion, not elsewhere classified: Secondary | ICD-10-CM | POA: Diagnosis not present

## 2021-07-14 DIAGNOSIS — H3530 Unspecified macular degeneration: Secondary | ICD-10-CM | POA: Diagnosis not present

## 2021-07-14 DIAGNOSIS — N183 Chronic kidney disease, stage 3 unspecified: Secondary | ICD-10-CM | POA: Insufficient documentation

## 2021-07-14 DIAGNOSIS — Z85528 Personal history of other malignant neoplasm of kidney: Secondary | ICD-10-CM | POA: Diagnosis not present

## 2021-07-14 DIAGNOSIS — E119 Type 2 diabetes mellitus without complications: Secondary | ICD-10-CM | POA: Diagnosis not present

## 2021-07-14 DIAGNOSIS — R918 Other nonspecific abnormal finding of lung field: Secondary | ICD-10-CM | POA: Diagnosis not present

## 2021-07-14 DIAGNOSIS — M1990 Unspecified osteoarthritis, unspecified site: Secondary | ICD-10-CM | POA: Diagnosis not present

## 2021-07-14 DIAGNOSIS — X58XXXA Exposure to other specified factors, initial encounter: Secondary | ICD-10-CM | POA: Insufficient documentation

## 2021-07-14 DIAGNOSIS — Z79899 Other long term (current) drug therapy: Secondary | ICD-10-CM | POA: Diagnosis not present

## 2021-07-14 DIAGNOSIS — R600 Localized edema: Secondary | ICD-10-CM | POA: Diagnosis not present

## 2021-07-14 DIAGNOSIS — I959 Hypotension, unspecified: Secondary | ICD-10-CM | POA: Diagnosis not present

## 2021-07-14 DIAGNOSIS — C688 Malignant neoplasm of overlapping sites of urinary organs: Secondary | ICD-10-CM | POA: Diagnosis not present

## 2021-07-14 DIAGNOSIS — I2510 Atherosclerotic heart disease of native coronary artery without angina pectoris: Secondary | ICD-10-CM | POA: Insufficient documentation

## 2021-07-14 DIAGNOSIS — E785 Hyperlipidemia, unspecified: Secondary | ICD-10-CM | POA: Diagnosis not present

## 2021-07-14 DIAGNOSIS — Z87891 Personal history of nicotine dependence: Secondary | ICD-10-CM | POA: Insufficient documentation

## 2021-07-14 DIAGNOSIS — R413 Other amnesia: Secondary | ICD-10-CM

## 2021-07-14 DIAGNOSIS — M109 Gout, unspecified: Secondary | ICD-10-CM | POA: Diagnosis not present

## 2021-07-14 DIAGNOSIS — I4510 Unspecified right bundle-branch block: Secondary | ICD-10-CM | POA: Diagnosis not present

## 2021-07-14 DIAGNOSIS — M7989 Other specified soft tissue disorders: Secondary | ICD-10-CM | POA: Insufficient documentation

## 2021-07-14 DIAGNOSIS — F0390 Unspecified dementia without behavioral disturbance: Secondary | ICD-10-CM | POA: Diagnosis not present

## 2021-07-14 DIAGNOSIS — C7801 Secondary malignant neoplasm of right lung: Secondary | ICD-10-CM | POA: Diagnosis not present

## 2021-07-14 DIAGNOSIS — T17920A Food in respiratory tract, part unspecified causing asphyxiation, initial encounter: Secondary | ICD-10-CM | POA: Diagnosis not present

## 2021-07-14 DIAGNOSIS — Z7983 Long term (current) use of bisphosphonates: Secondary | ICD-10-CM | POA: Diagnosis not present

## 2021-07-14 DIAGNOSIS — R911 Solitary pulmonary nodule: Secondary | ICD-10-CM | POA: Diagnosis not present

## 2021-07-14 DIAGNOSIS — C641 Malignant neoplasm of right kidney, except renal pelvis: Secondary | ICD-10-CM

## 2021-07-14 DIAGNOSIS — C7802 Secondary malignant neoplasm of left lung: Secondary | ICD-10-CM | POA: Diagnosis not present

## 2021-07-14 LAB — CBC WITH DIFFERENTIAL/PLATELET
Abs Immature Granulocytes: 0.06 10*3/uL (ref 0.00–0.07)
Basophils Absolute: 0 10*3/uL (ref 0.0–0.1)
Basophils Relative: 0 %
Eosinophils Absolute: 0 10*3/uL (ref 0.0–0.5)
Eosinophils Relative: 0 %
HCT: 24 % — ABNORMAL LOW (ref 39.0–52.0)
Hemoglobin: 7.2 g/dL — ABNORMAL LOW (ref 13.0–17.0)
Immature Granulocytes: 1 %
Lymphocytes Relative: 7 %
Lymphs Abs: 0.6 10*3/uL — ABNORMAL LOW (ref 0.7–4.0)
MCH: 27.7 pg (ref 26.0–34.0)
MCHC: 30 g/dL (ref 30.0–36.0)
MCV: 92.3 fL (ref 80.0–100.0)
Monocytes Absolute: 0.5 10*3/uL (ref 0.1–1.0)
Monocytes Relative: 5 %
Neutro Abs: 8.1 10*3/uL — ABNORMAL HIGH (ref 1.7–7.7)
Neutrophils Relative %: 87 %
Platelets: 334 10*3/uL (ref 150–400)
RBC: 2.6 MIL/uL — ABNORMAL LOW (ref 4.22–5.81)
RDW: 18.1 % — ABNORMAL HIGH (ref 11.5–15.5)
WBC: 9.3 10*3/uL (ref 4.0–10.5)
nRBC: 0 % (ref 0.0–0.2)

## 2021-07-14 LAB — BASIC METABOLIC PANEL
Anion gap: 7 (ref 5–15)
BUN: 54 mg/dL — ABNORMAL HIGH (ref 8–23)
CO2: 25 mmol/L (ref 22–32)
Calcium: 8.3 mg/dL — ABNORMAL LOW (ref 8.9–10.3)
Chloride: 104 mmol/L (ref 98–111)
Creatinine, Ser: 1.94 mg/dL — ABNORMAL HIGH (ref 0.61–1.24)
GFR, Estimated: 33 mL/min — ABNORMAL LOW (ref >60)
Glucose, Bld: 223 mg/dL — ABNORMAL HIGH (ref 70–99)
Potassium: 4.5 mmol/L (ref 3.5–5.1)
Sodium: 136 mmol/L (ref 135–145)

## 2021-07-14 NOTE — ED Triage Notes (Signed)
Pt arrived EMS pt chocked on a hot dog history of lung ca stage IV.  Pt alert skin warm and dry

## 2021-07-14 NOTE — Progress Notes (Signed)
Contacted patient for follow-up call, patient is doing well other than weakness which is unchanged since treatment, and patient had one episode of diarrhea last night which has resolved since.   While on the phone call RN spoke with [facility] nurse Evelena Asa, RN. She informed RN that patient will receive a visit from them once this week, twice next week, once the following week, and 1 PRN visit. RN was also informed that orders for OT and a home nurse aid will also be faxed to the [facility], charge nurse also made aware.

## 2021-07-14 NOTE — ED Provider Notes (Signed)
This patient is an 87 year old male, he has a history of prostate cancer as well as anemia, he has a known history of memory loss as well, he is on medications including Xanax atorvastatin, and ACE inhibitor and Coreg.  He also takes Lantus.  He presents to the hospital after choking on a hot dog, he was having difficulty getting up but it finally came up and he is now feeling back to normal.  He does report that he has had some progressive generalized weakness over several months but no other specific complaints and on my exam he does appear slightly pale and his nailbeds but has clear heart and lung sounds without wheezing without tachycardia.  He does have some swelling of his lower extremities right greater than left with pitting edema  He has not had any recent imaging of his legs for blood clot, he did have a PET scan performed approximately 6 weeks ago which showed a large hypermetabolic mass occupying the right kidney, local tumor extension into the right pelvis, periaortic metastatic lymphadenopathy and bilateral multifocal pulmonary metastatic disease.  The patient's vital signs are unremarkable, will get an ultrasound of his legs and some basic labs to make sure he is not severely anemic, from a aspiration or choking standpoint the patient is back to his normal self.  Medical screening examination/treatment/procedure(s) were conducted as a shared visit with non-physician practitioner(s) and myself.  I personally evaluated the patient during the encounter.  Clinical Impression:   Final diagnoses:  Cough  Choking due to food in larynx, initial encounter         Noemi Chapel, MD 07/15/21 1129

## 2021-07-14 NOTE — Discharge Instructions (Addendum)
We have discussed that it does not appear that you suffered any obvious injury from your choking event earlier today. We are reassured by the ultrasound of your lab which does not show DVT. We did note an anemia slightly lower than your baseline. Please follow up for your regular infusion, and seek re-evaluation if you become lightheaded, lose consciousness, or become increasingly weak. Please follow up if you have worsening shortness of breath, fever, or foul-smelling sputum as your recent choking event puts you at an increased risk for aspiration pneumonia.

## 2021-07-14 NOTE — ED Provider Notes (Signed)
[facility] EMERGENCY DEPARTMENT Provider Note   CSN: [id number] Arrival date & time: 07/14/21  1604     History Chief Complaint  Patient presents with   Cough    Choked on Hotdog    Grant [name] is a 87 y.o. male with a PMH significant for CAD, D2M, metastatic stage IV lung cancer, and HTN, who presents via EMS after choking on a hotdog with a concern about ongoing cough and to be evaluated for aspiration. Patient reports he was eating his hotdog, suddenly felt himself to be choking and immediately expelled hotdog as well as sputum for minutes. He had ongoing cough for several minutes. Patient denies hemoptysis, chest pain, difficulty breathing. Patient reports cough is improving since arrival. The patient reports he has had more trouble swallowing in past months and there is a swallow study that has been ordered by his oncology team to be done in the next few weeks. The patient reports generalized weakness, lack of energy over the last few weeks. He reports chronic anemia and receives infusions every 2-3 weeks.   Cough Associated symptoms: no chest pain and no diaphoresis       Past Medical History:  Diagnosis Date   Anemia, unspecified    Arthritis    CAD (coronary artery disease)    Cancer of kidney ([place])    Diabetes mellitus    Erectile dysfunction    GERD (gastroesophageal reflux disease)    Gout 09/09/2013   last flare 8'14 with gallbladder attack at [facility]   Hyperlipidemia    Hypertension    Lung nodule    Macular degeneration 09/09/2013   bilateral"vision is poor"   Pancreatitis 09/09/2013   8'14 -enzymes improved   Prostate cancer ([place])    Psoriasis 09/09/2013   elbows, knees   RBBB    Skin cancer    forehead   Tubular adenoma     Patient Active Problem List   Diagnosis Date Noted   Urothelial cancer ([place]) 07/11/2021   Normocytic anemia 06/26/2021   Right renal mass 06/26/2021   Lung nodules 06/26/2021   Malignant neoplasm of prostate ([place])  11/17/2019   Mixed hyperlipidemia 10/23/2017   Type 2 diabetes mellitus with stage 3 chronic kidney disease, with long-term current use of insulin ([place]) 04/22/2017   Trifascicular block 08/02/2016   Essential hypertension, benign 06/28/2016   Loss of weight 94/70/9628   Helicobacter pylori gastritis 06/20/2016   Mucosal abnormality of stomach    History of colonic polyps    History of adenomatous polyp of colon 01/27/2015   Anemia 01/27/2015   Inguinal hernia, bilateral-containing fat 09/03/2013   Gallstones 09/03/2013   Hyperlipidemia 08/03/2013   CAD (coronary artery disease) 07/31/2013   Pancreatitis 07/31/2013   Hyperkalemia 07/31/2013   Type 2 diabetes mellitus with vascular disease ([place]) 07/31/2013   Elevated liver enzymes 07/31/2013    Past Surgical History:  Procedure Laterality Date   CARDIAC CATHETERIZATION  02/19/2011   3 vessel CAD   CATARACT EXTRACTION W/PHACO Left 02/14/2016   Procedure: CATARACT EXTRACTION PHACO AND INTRAOCULAR LENS PLACEMENT ([place]);  Surgeon: Rutherford Guys, MD;  Location: [facility];  Service: Ophthalmology;  Laterality: Left;  CDE:6.96   CATARACT EXTRACTION W/PHACO Right 02/28/2016   Procedure: CATARACT EXTRACTION PHACO AND INTRAOCULAR LENS PLACEMENT ([facility]);  Surgeon: Rutherford Guys, MD;  Location: [facility];  Service: Ophthalmology;  Laterality: Right;  CDE:9.54   CHOLECYSTECTOMY N/A 10/27/2013   Procedure: LAPAROSCOPIC CHOLECYSTECTOMY WITH INTRAOPERATIVE CHOLANGIOGRAM WITH LIVER BIOPSY;  Surgeon: Isabel Caprice  Hassell Done, MD;  Location: [facility];  Service: General;  Laterality: N/A;   COLONOSCOPY  02/25/2012   Procedure: COLONOSCOPY;  Surgeon: Daneil Dolin, MD;  Location: [facility];  Service: Endoscopy;  Laterality: N/A;  7:30 AM polyps removed.   COLONOSCOPY N/A 02/11/2015   Dr.Rourk- multiple colonic polyps bx= tubular adenoma and an inflammatory polyp   CORONARY ARTERY BYPASS GRAFT  02-28-2011   LIMA to LAD,SVG to acute marginal branch of RCA & sequential SVG to  OM1 & OM2.   ESOPHAGOGASTRODUODENOSCOPY N/A 05/10/2016   RMR: mild schatzki ring, medium sized hh, H.pylori gastritis   NM MYOCAR PERF WALL MOTION  02/15/2011   High Risk   PILONIDAL CYST EXCISION     US ECHOCARDIOGRAPHY  11/09/2008   mildly impaired diastolic dysfunction - Grade I       Family History  Problem Relation Age of Onset   Stroke Father    Leukemia Mother    Heart failure Brother    Pancreatic cancer Sister    Hypertension Sister    Breast cancer Sister    Heart failure Sister    Anesthesia problems Neg Hx    Hypotension Neg Hx    Malignant hyperthermia Neg Hx    Pseudochol deficiency Neg Hx    Colon cancer Neg Hx    Prostate cancer Neg Hx     Social History   Tobacco Use   Smoking status: Former    Packs/day: 0.50    Years: 25.00    Pack years: 12.50    Types: Cigarettes    Quit date: 02/25/1983    Years since quitting: 38.4   Smokeless tobacco: Never  Vaping Use   Vaping Use: Never used  Substance Use Topics   Alcohol use: Not Currently    Comment: occasional   Drug use: No    Home Medications Prior to Admission medications   Medication Sig Start Date End Date Taking? Authorizing Provider  allopurinol (ZYLOPRIM) 100 MG tablet Take 100 mg by mouth daily. 07/24/14   [provider]  ALPRAZolam Duanne Moron) 0.25 MG tablet Take 1 tablet (0.25 mg total) by mouth 2 (two) times daily as needed for anxiety. 07/04/21   Derek Jack, MD  atorvastatin (LIPITOR) 40 MG tablet Take 40 mg by mouth every evening.     [provider]  B-D UF III MINI PEN NEEDLES 31G X 5 MM MISC USE AS DIRECTED TO INJECT LANTUS INSULIN AT AT BEDTIME 05/15/21   Cassandria Anger, MD  betamethasone dipropionate (DIPROLENE) 0.05 % cream Apply 1 application topically daily as needed (psoriasis). 11/22/15   [provider]  blood glucose meter kit and supplies KIT Dispense based on patient and insurance preference. Use up to 2 times daily as directed. (FOR  ICD-10 E11.65) Talking blood glucose meter. 07/07/18   Cassandria Anger, MD  Blood Glucose Monitoring Suppl (EASYMAX V BLOOD GLUCOSE) w/Device KIT 1 each by Does not apply route 2 (two) times daily. E11.65 06/14/21   Cassandria Anger, MD  Blood Glucose Monitoring Suppl (ONE TOUCH ULTRA 2) w/Device KIT 1 each by Other route 2 (two) times daily. 06/19/21   Brita Romp, NP  carvedilol (COREG) 12.5 MG tablet Take 12.5 mg by mouth 2 (two) times daily with a meal.     [provider]  donepezil (ARICEPT) 5 MG tablet Take 5 mg by mouth at bedtime.    [provider]  dronabinol (MARINOL) 2.5 MG capsule Take 1 capsule (2.5  mg total) by mouth 2 (two) times daily before a meal. 07/12/21   Derek Jack, MD  [facility] TEST test strip USE TO TEST TWICE DAILY. 12/01/20   Cassandria Anger, MD  LANTUS SOLOSTAR 100 UNIT/ML Solostar Pen ADMINISTER 20 UNITS UNDER THE SKIN AT BEDTIME Patient taking differently: Inject 20 Units into the skin at bedtime. 03/22/21   Cassandria Anger, MD  Misc Natural Products (TART CHERRY ADVANCED PO) Take 1,200 mg by mouth daily.     [provider]  Multiple Vitamin (MULTIVITAMIN) tablet Take 1 tablet by mouth daily.     [provider]  Multiple Vitamins-Minerals (PRESERVISION AREDS PO) Take 1 capsule by mouth 2 (two) times daily.     [provider]  quinapril (ACCUPRIL) 20 MG tablet Take 20 mg by mouth every morning.     [provider]    Allergies    Cephalexin and Amiodarone  Review of Systems   Review of Systems  Constitutional:  Positive for fatigue. Negative for diaphoresis and unexpected weight change.  HENT:  Positive for trouble swallowing. Negative for drooling and voice change.   Respiratory:  Positive for cough.   Cardiovascular:  Negative for chest pain.  Gastrointestinal:  Negative for abdominal distention and abdominal pain.  Genitourinary:  Negative for difficulty urinating.   Skin:  Positive for pallor.  Neurological:  Positive for weakness. Negative for dizziness.  All other systems reviewed and are negative.  Physical Exam Updated Vital Signs BP 137/82   Pulse 82   Temp (!) 97.1 F (36.2 C) (Oral)   Resp 16   Ht 6' (1.829 m)   Wt 74.8 kg   SpO2 99%   BMI 22.37 kg/m   Physical Exam Vitals and nursing note reviewed.  Constitutional:      General: He is not in acute distress.    Appearance: Normal appearance. He is not ill-appearing.  HENT:     Head: Normocephalic and atraumatic.     Mouth/Throat:     Mouth: Mucous membranes are moist.     Pharynx: Oropharynx is clear. No oropharyngeal exudate.     Comments: Pharynx is pink and clear of exudates, excoriations, inflammation, or erythema Eyes:     General:        Right eye: No discharge.        Left eye: No discharge.  Cardiovascular:     Rate and Rhythm: Normal rate and regular rhythm.     Heart sounds: No murmur heard.   No friction rub. No gallop.  Pulmonary:     Effort: Pulmonary effort is normal. No respiratory distress.     Breath sounds: Normal breath sounds.     Comments: Breath sounds present in all lung fields, clear air movement through trachea on auscultation Abdominal:     General: Bowel sounds are normal.     Palpations: Abdomen is soft.  Musculoskeletal:     Comments: Asymmetric swelling of right leg compared to left. 2+ pitting edema in right. 1+ in left. No erythema, tenderness of right leg. Distal pulses 2+ in both extremities.  Skin:    General: Skin is dry.     Capillary Refill: Capillary refill takes less than 2 seconds.     Coloration: Skin is pale.  Neurological:     Mental Status: He is alert and oriented to person, place, and time.  Psychiatric:        Mood and Affect: Mood normal.  Behavior: Behavior normal.    ED Results / Procedures / Treatments   Labs (all labs ordered are listed, but only abnormal results are displayed) Labs Reviewed  BASIC  METABOLIC PANEL - Abnormal; Notable for the following components:      Result Value   Glucose, Bld 223 (*)    BUN 54 (*)    Creatinine, Ser 1.94 (*)    Calcium 8.3 (*)    GFR, Estimated 33 (*)    All other components within normal limits  CBC WITH DIFFERENTIAL/PLATELET - Abnormal; Notable for the following components:   RBC 2.60 (*)    Hemoglobin 7.2 (*)    HCT 24.0 (*)    RDW 18.1 (*)    Neutro Abs 8.1 (*)    Lymphs Abs 0.6 (*)    All other components within normal limits    EKG None  Radiology US Venous Img Lower Bilateral  Result Date: 07/14/2021 CLINICAL DATA:  Chronic lower extremity edema right greater than left EXAM: BILATERAL LOWER EXTREMITY VENOUS DOPPLER ULTRASOUND TECHNIQUE: Gray-scale sonography with compression, as well as color and duplex ultrasound, were performed to evaluate the deep venous system(s) from the level of the common femoral vein through the popliteal and proximal calf veins. COMPARISON:  None. FINDINGS: VENOUS Normal compressibility of the common femoral, superficial femoral, and popliteal veins, as well as the visualized calf veins. Visualized portions of profunda femoral vein and great saphenous vein unremarkable. No filling defects to suggest DVT on grayscale or color Doppler imaging. Doppler waveforms show normal direction of venous flow, normal respiratory phasicity and response to augmentation. OTHER None. Limitations: none IMPRESSION: No femoropopliteal DVT nor evidence of DVT within the visualized calf veins. If clinical symptoms are inconsistent or if there are persistent or worsening symptoms, further imaging (possibly involving the iliac veins) may be warranted. Electronically Signed   By: Lucrezia Europe M.D.   On: 07/14/2021 19:36   DG Chest Portable 1 View  Result Date: 07/14/2021 CLINICAL DATA:  Choking history of lung cancer EXAM: PORTABLE CHEST 1 VIEW COMPARISON:  04/30/2021 FINDINGS: Post sternotomy changes. Numerous bilateral pulmonary nodules,  progressed compared to prior radiograph consistent with metastatic disease. Small right-sided pleural effusion. Normal cardiac size. Aortic atherosclerosis. No pneumothorax IMPRESSION: 1. Interval increase in numerous bilateral pulmonary nodules consistent with metastatic disease 2. Small right-sided pleural effusion Electronically Signed   By: Donavan Foil M.D.   On: 07/14/2021 17:01    Procedures Procedures   Medications Ordered in ED Medications - No data to display  ED Course  I have reviewed the triage vital signs and the nursing notes.  Pertinent labs & imaging results that were available during my care of the patient were reviewed by me and considered in my medical decision making (see chart for details).  Clinical Course as of 07/14/21 1943  Ludwig Clarks Jul 14, 2021  1904 Hemoglobin(!): 7.2 Slightly lower than patient's baseline, but patient runs very anemic, lives around 8. Patient receives transfusions every 2-3 weeks, is due for a transfusion next week. [CP]  1906 Glucose(!): 223 Patient ate directly prior to arrival [CP]  1906 Creatinine(!): 1.94 [CP]  1906 Less than 0.2 increase from baseline. No AKI. [CP]    Clinical Course User Index [CP] Mandolin Falwell, Joesph Fillers, PA-C   MDM Rules/Calculators/A&P                         Patient evaluated to be in NAD, with improving cough, and with  good respiratory effort in all lung fields. Patient has a significant burden of ongoing disease process including known metastatic lung cancer. CXR shows multiple pulmonary nodules and a small pleural effusion in the right lung. Favor part of ongoing disease process and non-contributory to patient's current evaluation.  Unilateral right leg swelling confirmed by [place] to not show DVT. Favor compromised lymphatic drainage from vein harvest due to prior bypass as patient suggests. Patient instructed to return to the emergency department if worsening shortness of breath, foul smelling sputum, fever as his  choking incident puts him at a higher risk for aspiration pneumonia.  Lab abnormalities address in ED course, all other values reassuring. Patient instructed to seek early transfusion for anemia, or come in for re-evaluation if he become syncopal or experiences increasing weakness.   Final Clinical Impression(s) / ED Diagnoses Final diagnoses:  Cough  Choking due to food in larynx, initial encounter    Rx / DC Orders ED Discharge Orders     None        Dorien Chihuahua 07/14/21 1944    Noemi Chapel, MD 07/15/21 [phone]

## 2021-07-18 ENCOUNTER — Ambulatory Visit: Payer: PPO | Admitting: Internal Medicine

## 2021-07-18 ENCOUNTER — Other Ambulatory Visit (HOSPITAL_COMMUNITY): Payer: Self-pay

## 2021-07-18 DIAGNOSIS — C61 Malignant neoplasm of prostate: Secondary | ICD-10-CM

## 2021-07-18 DIAGNOSIS — R918 Other nonspecific abnormal finding of lung field: Secondary | ICD-10-CM

## 2021-07-18 DIAGNOSIS — C689 Malignant neoplasm of urinary organ, unspecified: Secondary | ICD-10-CM

## 2021-07-18 NOTE — Progress Notes (Signed)
[place] [address] [place] [address] Phone: [phone] Fax: [place] PROGRESS NOTE   Grant [name] UK:[id number] 17-Oct-1934 87 y.o.  Grant [name] is managed by Dr. Delton Coombes for metastatic high-grade urothelial carcinoma of the right kidney with metastatic disease to the bilateral lungs.  Actively treated with chemotherapy/immunotherapy/hormonal therapy: YES  Current therapy: Pembrolizumab (Keytruda) Q 21 days - palliative / non-curative intent  Last treated: 07/11/2021  Next scheduled appointment with provider: 08/01/2021  Subjective:  Chief Complaint: Weakness, 1 week follow-up after initial immunotherapy  Grant [name] is an 87 year old male who is followed by Dr. Delton Coombes for metastatic urothelial carcinoma with metastatic disease to the bilateral lungs.  He had his first cycle of Keytruda on 07/11/2021 and returns today for symptom management, labs, and toxicity check.  Prior to initiation of immunotherapy, patient was complaining of significant weakness, decreased functional status, and dyspnea on exertion.  He has also had significant anemia, in part due to hematuria as well as relative iron deficiency in the setting of inflammation and chronic disease.  He received IV iron infusions with last Feraheme on 06/29/2021, but with little improvement in his hemoglobin or his symptoms.  Patient has had a marked increase in fatigue and weakness since Keytruda infusion.  His functional status has drastically declined.  According to his wife, the patient is now unable to stand on his own and is bedbound most of the day.  He is drinking about 40 ounces of water and 1-1/2 boost supplemental drinks per day, but is barely eating anything in addition to that.  Yesterday, his only food intake was half a bowl of grits and half piece of toast.  (We are unable to check his weight during the visit as he is too weak to  stand from the wheelchair.)  Patient denies being in any pain today during my visit, but had complained of some right-sided flank pain to the nurse who checked him in.  ROS otherwise positive for dyspnea on exertion, mild hematuria, and 1 episode of diarrhea 6 days ago.  He continues to have cough productive of white phlegm that comes and goes, possibly related to his difficulty swallowing.  He has not had any syncope, palpitations, melena, hematochezia.  No skin rash or flareup of his psoriasis after Keytruda.   Review of Systems:  Review of Systems  Constitutional:  Positive for activity change, appetite change, fatigue and unexpected weight change. Negative for chills, diaphoresis and fever.  HENT:  Positive for trouble swallowing.   Respiratory:  Positive for cough, choking and shortness of breath (with exertion).   Cardiovascular:  Positive for leg swelling (chronic RLE edema). Negative for chest pain.  Gastrointestinal:  Positive for abdominal pain (intermittent right flank pain). Negative for blood in stool, constipation, diarrhea, nausea and vomiting.  Endocrine: Positive for cold intolerance.  Genitourinary:  Positive for hematuria (scant).  Skin:  Negative for rash.  Psychiatric/Behavioral:  Positive for confusion.     Past Medical History, Surgical history, Social history, and Family history were reviewed as documented elsewhere in chart, and were updated as appropriate.   Objective:   Physical Exam:  There were no vitals taken for this visit. ECOG: 4  Physical Exam Constitutional:      Appearance: He is ill-appearing.     Comments: Weak, ill-appearing, uncomfortable and mildly agitated; frail  HENT:     Head: Normocephalic and atraumatic.     Mouth/Throat:  Mouth: Mucous membranes are moist.  Eyes:     Extraocular Movements: Extraocular movements intact.     Pupils: Pupils are equal, round, and reactive to light.  Cardiovascular:     Rate and Rhythm: Normal rate  and regular rhythm.     Pulses: Normal pulses.     Heart sounds: Normal heart sounds.  Pulmonary:     Effort: Pulmonary effort is normal.     Breath sounds: Normal breath sounds.  Abdominal:     General: Bowel sounds are normal.     Palpations: Abdomen is soft.     Tenderness: There is no abdominal tenderness.  Musculoskeletal:        General: No swelling.     Right lower leg: Edema present.     Left lower leg: No edema.  Lymphadenopathy:     Cervical: No cervical adenopathy.  Skin:    General: Skin is warm and dry.  Neurological:     General: No focal deficit present.     Mental Status: He is confused.  Psychiatric:        Mood and Affect: Mood is anxious.        Behavior: Behavior is agitated.    Lab Review:     Component Value Date/Time   NA 136 07/14/2021 1646   NA 138 03/28/2021 0000   K 4.5 07/14/2021 1646   CL 104 07/14/2021 1646   CO2 25 07/14/2021 1646   GLUCOSE 223 (H) 07/14/2021 1646   BUN 54 (H) 07/14/2021 1646   BUN 43 (A) 03/28/2021 0000   CREATININE 1.94 (H) 07/14/2021 1646   CREATININE 1.28 (H) 04/19/2020 1033   CALCIUM 8.3 (L) 07/14/2021 1646   PROT 6.3 (L) 07/10/2021 1029   ALBUMIN 2.0 (L) 07/10/2021 1029   AST 37 07/10/2021 1029   ALT 36 07/10/2021 1029   ALKPHOS 242 (H) 07/10/2021 1029   BILITOT 0.9 07/10/2021 1029   GFRNONAA 33 (L) 07/14/2021 1646   GFRNONAA 51 (L) 04/19/2020 1033   GFRAA 59 (L) 04/19/2020 1033       Component Value Date/Time   WBC 9.3 07/14/2021 1646   RBC 2.60 (L) 07/14/2021 1646   HGB 7.2 (L) 07/14/2021 1646   HCT 24.0 (L) 07/14/2021 1646   PLT 334 07/14/2021 1646   MCV 92.3 07/14/2021 1646   MCH 27.7 07/14/2021 1646   MCHC 30.0 07/14/2021 1646   RDW 18.1 (H) 07/14/2021 1646   LYMPHSABS 0.6 (L) 07/14/2021 1646   MONOABS 0.5 07/14/2021 1646   EOSABS 0.0 07/14/2021 1646   BASOSABS 0.0 07/14/2021 1646   -------------------------------  Imaging from last 24 hours (if applicable):  Radiology interpretation: CT  HEAD W & WO CONTRAST (5MM)  Result Date: 07/10/2021 CLINICAL DATA:  Mental status change, persistent or worsening; right renal mass EXAM: CT HEAD WITHOUT AND WITH CONTRAST TECHNIQUE: Contiguous axial images were obtained from the base of the skull through the vertex without and with intravenous contrast CONTRAST:  16m OMNIPAQUE IOHEXOL 300 MG/ML  SOLN COMPARISON:  Noncontrast CT head January 2022 FINDINGS: Brain: There is no acute intracranial hemorrhage, mass, mass effect, or edema. No abnormal enhancement. Gray-white differentiation is preserved. There is no extra-axial fluid collection. Stable prominence of ventricles and sulci reflecting parenchymal volume loss. Patchy areas of low-attenuation in the supratentorial white matter probably reflect stable chronic microvascular ischemic changes. Vascular: There is atherosclerotic calcification at the skull base. Skull: Calvarium is unremarkable. Sinuses/Orbits: Mild polypoid mucosal thickening. Orbits are unremarkable. Other: A polyp is  again noted within the nasal cavity and nasopharynx. Mastoid air cells are clear. IMPRESSION: No evidence of intracranial metastatic disease or other acute abnormality. Electronically Signed   By: Macy Mis M.D.   On: 07/10/2021 12:59   US Venous Img Lower Bilateral  Result Date: 07/14/2021 CLINICAL DATA:  Chronic lower extremity edema right greater than left EXAM: BILATERAL LOWER EXTREMITY VENOUS DOPPLER ULTRASOUND TECHNIQUE: Gray-scale sonography with compression, as well as color and duplex ultrasound, were performed to evaluate the deep venous system(s) from the level of the common femoral vein through the popliteal and proximal calf veins. COMPARISON:  None. FINDINGS: VENOUS Normal compressibility of the common femoral, superficial femoral, and popliteal veins, as well as the visualized calf veins. Visualized portions of profunda femoral vein and great saphenous vein unremarkable. No filling defects to suggest DVT on  grayscale or color Doppler imaging. Doppler waveforms show normal direction of venous flow, normal respiratory phasicity and response to augmentation. OTHER None. Limitations: none IMPRESSION: No femoropopliteal DVT nor evidence of DVT within the visualized calf veins. If clinical symptoms are inconsistent or if there are persistent or worsening symptoms, further imaging (possibly involving the iliac veins) may be warranted. Electronically Signed   By: Lucrezia Europe M.D.   On: 07/14/2021 19:36   DG Chest Portable 1 View  Result Date: 07/14/2021 CLINICAL DATA:  Choking history of lung cancer EXAM: PORTABLE CHEST 1 VIEW COMPARISON:  04/30/2021 FINDINGS: Post sternotomy changes. Numerous bilateral pulmonary nodules, progressed compared to prior radiograph consistent with metastatic disease. Small right-sided pleural effusion. Normal cardiac size. Aortic atherosclerosis. No pneumothorax IMPRESSION: 1. Interval increase in numerous bilateral pulmonary nodules consistent with metastatic disease 2. Small right-sided pleural effusion Electronically Signed   By: Donavan Foil M.D.   On: 07/14/2021 17:01   CT RENAL BIOPSY  Result Date: 07/03/2021 INDICATION: 87 year old male with FDG avid lesion of the right kidney, potentially primary kidney lesion versus lymphoma. EXAM: CT-GUIDED BIOPSY OF RIGHT LIVER MASS MEDICATIONS: None. ANESTHESIA/SEDATION: No moderate sedation Moderate Sedation Time: 0 minutes. The patient's level of consciousness and vital signs were monitored continuously by radiology nursing throughout the procedure under my direct supervision. FLUOROSCOPY TIME:  CT COMPLICATIONS: None PROCEDURE: Informed written consent was obtained from the patient after a thorough discussion of the procedural risks, benefits and alternatives. All questions were addressed. Maximal Sterile Barrier Technique was utilized including caps, mask, sterile gowns, sterile gloves, sterile drape, hand hygiene and skin antiseptic. A  timeout was performed prior to the initiation of the procedure. Patient position prone position on the CT gantry table. Scout CT were acquired for planning purposes. Using CT guidance, 17 gauge introducer needle was advanced into the right kidney, targeting the region of FDG activity on the prior PET-CT. Once we confirmed needle tip position, multiple 18 gauge core biopsy were performed. Samples were placed into both formalin and tip fresh specimen. Gel-Foam pledgets were placed with saline and then the needle was removed. Final image was stored. Patient tolerated the procedure well and remained hemodynamically stable throughout. No complications were encountered and no significant blood loss. IMPRESSION: Status post CT-guided biopsy of right renal lesion. Signed, Dulcy Fanny. Dellia Nims, RPVI [facility] Electronically Signed   By: Corrie Mckusick D.O.   On: 07/03/2021 10:11      Assessment & Plan:    1.  Metastatic urothelial carcinoma to the lungs and lymph nodes - Being managed by Dr. Delton Coombes with palliative/not curative Beryle Flock (not a candidate  for platinum based chemotherapy as first-line, due to patient's poor functional status) - Patient has had extreme fatigue and marked decline in functional status after his first dose of Keytruda - he is primarily bedbound, and unable to stand without assistance - Due to poor tolerance of Keytruda and extremely poor functional status, he unfortunately is not a candidate for continued treatment at this time (discussed with Dr. Delton Coombes, who agrees) - Discussed extensively with the patient and his wife during today's visit, and they agree that hospice is the best step at this point - We offered fluids and blood transfusion to help with patient's symptoms of fatigue, but he declined, stating that he just wanted to go home and rest - PLAN: Patient will discharge from clinic on hospice.  No further  appointments needed at cancer clinic.   All questions were answered. The patient knows to call the clinic with any problems, questions or concerns.  Medical decision making: High (decision making regarding end-of-life care)  Time spent on visit: I spent 40 minutes counseling the patient face to face. The total time spent in the appointment was 55 minutes and more than 50% was on counseling.   Harriett Rush, PA-C  07/19/2021 12:03 PM

## 2021-07-19 ENCOUNTER — Inpatient Hospital Stay (HOSPITAL_COMMUNITY): Payer: PPO

## 2021-07-19 ENCOUNTER — Inpatient Hospital Stay (HOSPITAL_COMMUNITY): Payer: PPO | Admitting: Physician Assistant

## 2021-07-19 ENCOUNTER — Other Ambulatory Visit: Payer: Self-pay

## 2021-07-19 VITALS — BP 145/52 | HR 61 | Temp 96.8°F | Resp 16

## 2021-07-19 DIAGNOSIS — R918 Other nonspecific abnormal finding of lung field: Secondary | ICD-10-CM

## 2021-07-19 DIAGNOSIS — C7910 Secondary malignant neoplasm of unspecified urinary organs: Secondary | ICD-10-CM

## 2021-07-19 DIAGNOSIS — G4700 Insomnia, unspecified: Secondary | ICD-10-CM | POA: Diagnosis not present

## 2021-07-19 DIAGNOSIS — D649 Anemia, unspecified: Secondary | ICD-10-CM

## 2021-07-19 DIAGNOSIS — C61 Malignant neoplasm of prostate: Secondary | ICD-10-CM

## 2021-07-19 DIAGNOSIS — Z5112 Encounter for antineoplastic immunotherapy: Secondary | ICD-10-CM | POA: Diagnosis not present

## 2021-07-19 DIAGNOSIS — C689 Malignant neoplasm of urinary organ, unspecified: Secondary | ICD-10-CM

## 2021-07-19 LAB — CBC WITH DIFFERENTIAL/PLATELET
Abs Immature Granulocytes: 0.11 10*3/uL — ABNORMAL HIGH (ref 0.00–0.07)
Basophils Absolute: 0 10*3/uL (ref 0.0–0.1)
Basophils Relative: 0 %
Eosinophils Absolute: 0.1 10*3/uL (ref 0.0–0.5)
Eosinophils Relative: 1 %
HCT: 26.5 % — ABNORMAL LOW (ref 39.0–52.0)
Hemoglobin: 7.9 g/dL — ABNORMAL LOW (ref 13.0–17.0)
Immature Granulocytes: 1 %
Lymphocytes Relative: 8 %
Lymphs Abs: 0.9 10*3/uL (ref 0.7–4.0)
MCH: 27.2 pg (ref 26.0–34.0)
MCHC: 29.8 g/dL — ABNORMAL LOW (ref 30.0–36.0)
MCV: 91.4 fL (ref 80.0–100.0)
Monocytes Absolute: 0.5 10*3/uL (ref 0.1–1.0)
Monocytes Relative: 5 %
Neutro Abs: 9 10*3/uL — ABNORMAL HIGH (ref 1.7–7.7)
Neutrophils Relative %: 85 %
Platelets: 355 10*3/uL (ref 150–400)
RBC: 2.9 MIL/uL — ABNORMAL LOW (ref 4.22–5.81)
RDW: 18.4 % — ABNORMAL HIGH (ref 11.5–15.5)
WBC: 10.6 10*3/uL — ABNORMAL HIGH (ref 4.0–10.5)
nRBC: 0 % (ref 0.0–0.2)

## 2021-07-19 LAB — MAGNESIUM: Magnesium: 2.1 mg/dL (ref 1.7–2.4)

## 2021-07-19 LAB — COMPREHENSIVE METABOLIC PANEL
ALT: 27 U/L (ref 0–44)
AST: 28 U/L (ref 15–41)
Albumin: 2 g/dL — ABNORMAL LOW (ref 3.5–5.0)
Alkaline Phosphatase: 231 U/L — ABNORMAL HIGH (ref 38–126)
Anion gap: 8 (ref 5–15)
BUN: 47 mg/dL — ABNORMAL HIGH (ref 8–23)
CO2: 25 mmol/L (ref 22–32)
Calcium: 8.6 mg/dL — ABNORMAL LOW (ref 8.9–10.3)
Chloride: 105 mmol/L (ref 98–111)
Creatinine, Ser: 1.66 mg/dL — ABNORMAL HIGH (ref 0.61–1.24)
GFR, Estimated: 40 mL/min — ABNORMAL LOW (ref >60)
Glucose, Bld: 112 mg/dL — ABNORMAL HIGH (ref 70–99)
Potassium: 4.6 mmol/L (ref 3.5–5.1)
Sodium: 138 mmol/L (ref 135–145)
Total Bilirubin: 0.9 mg/dL (ref 0.3–1.2)
Total Protein: 6.3 g/dL — ABNORMAL LOW (ref 6.5–8.1)

## 2021-07-19 LAB — TSH: TSH: 5.811 u[IU]/mL — ABNORMAL HIGH (ref 0.350–4.500)

## 2021-07-19 MED ORDER — TEMAZEPAM 15 MG PO CAPS
15.0000 mg | ORAL_CAPSULE | Freq: Every day | ORAL | 0 refills | Status: AC
Start: 2021-07-19 — End: ?

## 2021-07-19 NOTE — Progress Notes (Addendum)
No IV fluids, blood or iron today pt's family requesting hospice.

## 2021-07-20 ENCOUNTER — Ambulatory Visit: Payer: PPO | Admitting: General Surgery

## 2021-07-25 ENCOUNTER — Ambulatory Visit (HOSPITAL_COMMUNITY): Payer: PPO | Admitting: Hematology

## 2021-07-25 ENCOUNTER — Other Ambulatory Visit (HOSPITAL_COMMUNITY): Payer: PPO

## 2021-07-25 ENCOUNTER — Ambulatory Visit (HOSPITAL_COMMUNITY): Payer: PPO

## 2021-08-01 ENCOUNTER — Other Ambulatory Visit (HOSPITAL_COMMUNITY): Payer: PPO

## 2021-08-01 ENCOUNTER — Ambulatory Visit (HOSPITAL_COMMUNITY): Payer: PPO

## 2021-08-01 ENCOUNTER — Ambulatory Visit (HOSPITAL_COMMUNITY): Payer: PPO | Admitting: Hematology

## 2021-08-03 DEATH — deceased

## 2021-08-16 ENCOUNTER — Encounter (HOSPITAL_COMMUNITY): Payer: Self-pay

## 2022-02-06 IMAGING — CT NM PET TUM IMG INITIAL (PI) SKULL BASE T - THIGH
1 of 7 series · 1 of 25 positions shown · non-contrast
Comparison: CT abdomen pelvis 05/01/2019 without contrast

CLINICAL DATA: Initial treatment strategy for pulmonary nodules.

EXAM:
NUCLEAR MEDICINE PET SKULL BASE TO THIGH
TECHNIQUE: 8.4 mCi F-18 FDG was injected intravenously. Full-ring PET imaging
was performed from the skull base to thigh after the radiotracer. CT
data was obtained and used for attenuation correction and anatomic
localization.
Fasting blood glucose: 99 mg/dl

[Series 4: ct sk_thigh 5.0 bf37 · axial · 5.0mm · 0.98mm/px · 1 of 233 slices shown]
[im 59/233  brain]
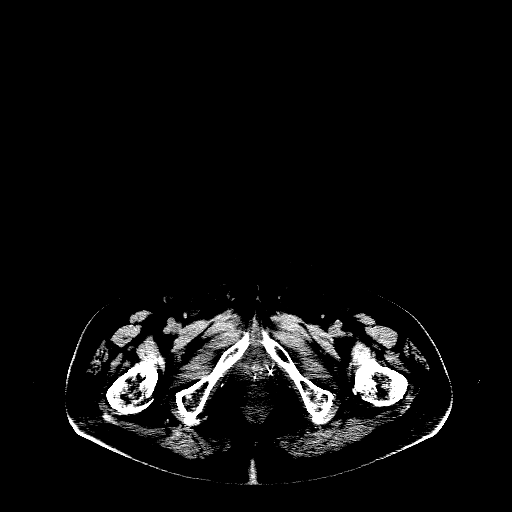

[1 of 25 positions shown; findings below may reference images not displayed]

FINDINGS: Mediastinal blood pool activity: SUV max

Liver activity: SUV max NA

NECK: No hypermetabolic lymph nodes in the neck.

Incidental CT findings: none

CHEST: Multiple round hypermetabolic pulmonary nodules of varying
size. Example nodule in the medial RIGHT lower lobe measures 19 mm
with SUV max equal 8.2. A similar LEFT lower lobe nodule cold last
22.4 cm (image 80) with SUV max equal 5.3. Example RIGHT upper lobe
nodule measures 1.0 cm with SUV max equal 5.1 on image 44.

There approximately 20 nodules within each lung.

No hypermetabolic supraclavicular nodes. No hypermetabolic axillary
nodes.

Incidental CT findings: none initial

ABDOMEN/PELVIS: There is hypermetabolic mass expanding the mid and
lower pole of the RIGHT kidney with SUV max equal 15.9. The activity
extends to the cortex and therefore is favored malignancy rather
than urine accumulation. Hypermetabolic activity extends into the
RIGHT renal pelvis. The tumor mass is difficult to measure on
noncontrast exam (approximately 7 cm by 5 cm by 6.8 cm).

Hypermetabolic LEFT periaortic lymph nodes which are intense with
SUV max equal 14.7. Example node measures 19 mm image 108/4.

No pelvic adenopathy.  No abnormal activity the prostate gland.

Incidental CT findings: none

SKELETON: No focal hypermetabolic activity to suggest skeletal
metastasis.

Incidental CT findings: none
IMPRESSION: 1. Large hypermetabolic mass occupying the entirety of the mid and
lower pole the RIGHT kidney. IF patient cannot receive IV contrast,
a non contrast MRI of the abdomen may provide some characterization
of the renal malignancy. Contrast MRI would be preferred.
2. Concern for local tumor extension into the RIGHT renal pelvis.
3. Intense hypermetabolic retroperitoneal periaortic metastatic
lymphadenopathy.
4. Bilateral intensely hypermetabolic multifocal pulmonary
metastasis.
